# Patient Record
Sex: Female | Born: 1945 | Race: White | Hispanic: No | Smoking: Former smoker
Health system: Southern US, Community
[De-identification: ages and names within clinical notes are randomized; demographics above are authoritative.]

## PROBLEM LIST (undated history)

## (undated) DIAGNOSIS — I10 Essential (primary) hypertension: Secondary | ICD-10-CM

## (undated) DIAGNOSIS — I509 Heart failure, unspecified: Secondary | ICD-10-CM

## (undated) DIAGNOSIS — I214 Non-ST elevation (NSTEMI) myocardial infarction: Secondary | ICD-10-CM

## (undated) DIAGNOSIS — J310 Chronic rhinitis: Secondary | ICD-10-CM

## (undated) DIAGNOSIS — R0602 Shortness of breath: Secondary | ICD-10-CM

## (undated) DIAGNOSIS — E039 Hypothyroidism, unspecified: Secondary | ICD-10-CM

## (undated) DIAGNOSIS — Z8719 Personal history of other diseases of the digestive system: Secondary | ICD-10-CM

## (undated) DIAGNOSIS — F419 Anxiety disorder, unspecified: Secondary | ICD-10-CM

## (undated) DIAGNOSIS — I4892 Unspecified atrial flutter: Secondary | ICD-10-CM

## (undated) DIAGNOSIS — E079 Disorder of thyroid, unspecified: Secondary | ICD-10-CM

## (undated) DIAGNOSIS — K1120 Sialoadenitis, unspecified: Secondary | ICD-10-CM

## (undated) DIAGNOSIS — K219 Gastro-esophageal reflux disease without esophagitis: Secondary | ICD-10-CM

## (undated) DIAGNOSIS — Z9289 Personal history of other medical treatment: Secondary | ICD-10-CM

## (undated) DIAGNOSIS — J449 Chronic obstructive pulmonary disease, unspecified: Secondary | ICD-10-CM

## (undated) DIAGNOSIS — J189 Pneumonia, unspecified organism: Secondary | ICD-10-CM

## (undated) HISTORY — PX: BREAST SURGERY: SHX581

## (undated) HISTORY — PX: TONSILLECTOMY: SUR1361

## (undated) HISTORY — DX: Heart failure, unspecified: I50.9

## (undated) HISTORY — DX: Gastro-esophageal reflux disease without esophagitis: K21.9

## (undated) HISTORY — DX: Sialoadenitis, unspecified: K11.20

## (undated) HISTORY — DX: Chronic rhinitis: J31.0

## (undated) HISTORY — DX: Personal history of other medical treatment: Z92.89

---

## 2000-01-21 ENCOUNTER — Encounter: Payer: Self-pay | Admitting: *Deleted

## 2000-01-21 ENCOUNTER — Encounter: Admission: RE | Admit: 2000-01-21 | Discharge: 2000-01-21 | Payer: Self-pay | Admitting: *Deleted

## 2001-02-16 ENCOUNTER — Encounter: Admission: RE | Admit: 2001-02-16 | Discharge: 2001-02-16 | Payer: Self-pay | Admitting: Internal Medicine

## 2001-02-16 ENCOUNTER — Encounter: Payer: Self-pay | Admitting: Internal Medicine

## 2001-05-11 ENCOUNTER — Encounter: Payer: Self-pay | Admitting: Internal Medicine

## 2001-05-11 ENCOUNTER — Encounter: Admission: RE | Admit: 2001-05-11 | Discharge: 2001-05-11 | Payer: Self-pay | Admitting: Internal Medicine

## 2005-11-02 ENCOUNTER — Emergency Department (HOSPITAL_COMMUNITY): Admission: EM | Admit: 2005-11-02 | Discharge: 2005-11-02 | Payer: Self-pay | Admitting: Family Medicine

## 2005-11-21 ENCOUNTER — Encounter: Admission: RE | Admit: 2005-11-21 | Discharge: 2005-11-21 | Payer: Self-pay | Admitting: Internal Medicine

## 2010-10-14 ENCOUNTER — Encounter: Payer: Self-pay | Admitting: Internal Medicine

## 2012-03-10 ENCOUNTER — Other Ambulatory Visit: Payer: Self-pay | Admitting: Internal Medicine

## 2012-03-10 DIAGNOSIS — Z1231 Encounter for screening mammogram for malignant neoplasm of breast: Secondary | ICD-10-CM

## 2012-04-06 ENCOUNTER — Encounter (HOSPITAL_COMMUNITY): Payer: Self-pay

## 2012-04-20 ENCOUNTER — Ambulatory Visit: Payer: Self-pay

## 2012-04-27 ENCOUNTER — Encounter (HOSPITAL_COMMUNITY): Payer: Self-pay

## 2012-05-04 ENCOUNTER — Encounter (HOSPITAL_COMMUNITY): Payer: Self-pay

## 2012-05-11 ENCOUNTER — Ambulatory Visit: Payer: Self-pay

## 2012-05-18 ENCOUNTER — Encounter (HOSPITAL_COMMUNITY): Payer: Self-pay

## 2012-06-01 ENCOUNTER — Ambulatory Visit: Payer: Self-pay

## 2012-06-08 ENCOUNTER — Encounter (HOSPITAL_COMMUNITY): Payer: Self-pay

## 2012-06-15 ENCOUNTER — Ambulatory Visit: Payer: Self-pay

## 2012-06-22 ENCOUNTER — Encounter (HOSPITAL_COMMUNITY): Payer: Self-pay

## 2012-06-29 ENCOUNTER — Encounter (HOSPITAL_COMMUNITY): Payer: Self-pay

## 2012-07-06 ENCOUNTER — Ambulatory Visit: Payer: Self-pay

## 2012-07-13 ENCOUNTER — Encounter (HOSPITAL_COMMUNITY): Payer: Self-pay

## 2012-07-20 ENCOUNTER — Inpatient Hospital Stay (HOSPITAL_COMMUNITY): Admission: RE | Admit: 2012-07-20 | Payer: Self-pay | Source: Ambulatory Visit

## 2012-07-27 ENCOUNTER — Ambulatory Visit: Payer: Self-pay

## 2012-08-31 ENCOUNTER — Ambulatory Visit: Payer: Self-pay

## 2012-09-06 ENCOUNTER — Encounter (HOSPITAL_COMMUNITY): Payer: Self-pay | Admitting: *Deleted

## 2012-09-06 ENCOUNTER — Inpatient Hospital Stay (HOSPITAL_COMMUNITY)
Admission: EM | Admit: 2012-09-06 | Discharge: 2012-09-12 | DRG: 154 | Disposition: A | Discharge status: Home / Self Care | Payer: Medicare Other | Attending: Internal Medicine | Admitting: Internal Medicine

## 2012-09-06 ENCOUNTER — Other Ambulatory Visit: Payer: Self-pay

## 2012-09-06 ENCOUNTER — Emergency Department (HOSPITAL_COMMUNITY): Payer: Medicare Other

## 2012-09-06 DIAGNOSIS — J441 Chronic obstructive pulmonary disease with (acute) exacerbation: Secondary | ICD-10-CM | POA: Diagnosis present

## 2012-09-06 DIAGNOSIS — F411 Generalized anxiety disorder: Secondary | ICD-10-CM | POA: Diagnosis present

## 2012-09-06 DIAGNOSIS — Z720 Tobacco use: Secondary | ICD-10-CM | POA: Diagnosis present

## 2012-09-06 DIAGNOSIS — J9600 Acute respiratory failure, unspecified whether with hypoxia or hypercapnia: Secondary | ICD-10-CM | POA: Diagnosis present

## 2012-09-06 DIAGNOSIS — I4892 Unspecified atrial flutter: Secondary | ICD-10-CM | POA: Diagnosis present

## 2012-09-06 DIAGNOSIS — E890 Postprocedural hypothyroidism: Secondary | ICD-10-CM | POA: Diagnosis present

## 2012-09-06 DIAGNOSIS — E669 Obesity, unspecified: Secondary | ICD-10-CM | POA: Diagnosis present

## 2012-09-06 DIAGNOSIS — F17200 Nicotine dependence, unspecified, uncomplicated: Secondary | ICD-10-CM | POA: Diagnosis present

## 2012-09-06 DIAGNOSIS — I5031 Acute diastolic (congestive) heart failure: Secondary | ICD-10-CM | POA: Diagnosis present

## 2012-09-06 DIAGNOSIS — J9690 Respiratory failure, unspecified, unspecified whether with hypoxia or hypercapnia: Secondary | ICD-10-CM | POA: Diagnosis present

## 2012-09-06 DIAGNOSIS — R221 Localized swelling, mass and lump, neck: Secondary | ICD-10-CM

## 2012-09-06 DIAGNOSIS — Z6836 Body mass index (BMI) 36.0-36.9, adult: Secondary | ICD-10-CM

## 2012-09-06 DIAGNOSIS — I10 Essential (primary) hypertension: Secondary | ICD-10-CM | POA: Diagnosis present

## 2012-09-06 DIAGNOSIS — K1120 Sialoadenitis, unspecified: Secondary | ICD-10-CM | POA: Diagnosis present

## 2012-09-06 HISTORY — DX: Hypothyroidism, unspecified: E03.9

## 2012-09-06 HISTORY — DX: Disorder of thyroid, unspecified: E07.9

## 2012-09-06 HISTORY — DX: Shortness of breath: R06.02

## 2012-09-06 HISTORY — DX: Personal history of other diseases of the digestive system: Z87.19

## 2012-09-06 HISTORY — DX: Chronic obstructive pulmonary disease, unspecified: J44.9

## 2012-09-06 HISTORY — DX: Unspecified atrial flutter: I48.92

## 2012-09-06 HISTORY — DX: Essential (primary) hypertension: I10

## 2012-09-06 HISTORY — DX: Anxiety disorder, unspecified: F41.9

## 2012-09-06 LAB — CBC WITH DIFFERENTIAL/PLATELET
Basophils Absolute: 0 10*3/uL (ref 0.0–0.1)
Basophils Relative: 0 % (ref 0–1)
Eosinophils Absolute: 0.1 10*3/uL (ref 0.0–0.7)
Eosinophils Relative: 1 % (ref 0–5)
HCT: 45.7 % (ref 36.0–46.0)
Lymphocytes Relative: 14 % (ref 12–46)
MCH: 33.3 pg (ref 26.0–34.0)
MCHC: 33.5 g/dL (ref 30.0–36.0)
MCV: 99.3 fL (ref 78.0–100.0)
Monocytes Absolute: 1.2 10*3/uL — ABNORMAL HIGH (ref 0.1–1.0)
Platelets: 236 10*3/uL (ref 150–400)
RDW: 14.2 % (ref 11.5–15.5)

## 2012-09-06 LAB — COMPREHENSIVE METABOLIC PANEL
AST: 17 U/L (ref 0–37)
CO2: 26 mEq/L (ref 19–32)
Calcium: 9.5 mg/dL (ref 8.4–10.5)
Creatinine, Ser: 0.66 mg/dL (ref 0.50–1.10)
GFR calc non Af Amer: >90 mL/min (ref >90)
Total Protein: 7.1 g/dL (ref 6.0–8.3)

## 2012-09-06 LAB — POCT I-STAT TROPONIN I

## 2012-09-06 MED ORDER — DILTIAZEM HCL 100 MG IV SOLR
5.0000 mg/h | Freq: Once | INTRAVENOUS | Status: AC
  Administered 2012-09-06: 5 mg/h via INTRAVENOUS
  Filled 2012-09-06: qty 100

## 2012-09-06 MED ORDER — METOPROLOL TARTRATE 1 MG/ML IV SOLN
5.0000 mg | Freq: Once | INTRAVENOUS | Status: AC
  Administered 2012-09-06: 5 mg via INTRAVENOUS
  Filled 2012-09-06: qty 5

## 2012-09-06 MED ORDER — DILTIAZEM HCL 25 MG/5ML IV SOLN
20.0000 mg | Freq: Once | INTRAVENOUS | Status: AC
  Administered 2012-09-06: 20 mg via INTRAVENOUS

## 2012-09-06 NOTE — ED Notes (Signed)
Admitting here to see, pt in CT.

## 2012-09-06 NOTE — ED Notes (Signed)
Pt alert, NAD, calm, interactive, skin W&D, resps e/u, speaking in clear complete sentences, to CT, IV site reinforced.

## 2012-09-06 NOTE — ED Notes (Signed)
Pt in c/o swelling to left side of face since this afternoon, pt states this happened to her approx 10 years ago and was told it had to do with her glands in her mouth, airway intact, no distress noted.

## 2012-09-06 NOTE — ED Notes (Signed)
Pt denies CP at this time. Pt appears anxious at this time. Comfort measures attempted. Pt placed on bedpan.

## 2012-09-06 NOTE — ED Provider Notes (Signed)
History     CSN: [id number]  Arrival date & time 09/06/12  2138   First MD Initiated Contact with Patient 09/06/12 2206      Chief Complaint  Patient presents with  . Facial Swelling    (Consider location/radiation/quality/duration/timing/severity/associated sxs/prior treatment) The history is provided by the patient.   patient here with left-sided neck swelling x1 day. History of similar symptoms 10 years ago diagnosed with possible salivary gland obstruction. Denies any chest pain chest pressure. No dyspnea. No palpitations. States she does feel jittery. While patient was in triage she did have an EKG that showed atrial flutter and she was sent back to her room. She denies any prior history of atrial flutter. Does have a history of hypothyroidism and denies any medication changes  Past Medical History  Diagnosis Date  . Hypertension   . Thyroid disease     History reviewed. No pertinent past surgical history.  History reviewed. No pertinent family history.  History  Substance Use Topics  . Smoking status: Not on file  . Smokeless tobacco: Not on file  . Alcohol Use:     OB History    Grav Para Term Preterm Abortions TAB SAB Ect Mult Living                  Review of Systems  All other systems reviewed and are negative.    Allergies  Review of patient's allergies indicates no known allergies.  Home Medications  No current outpatient prescriptions on file.  BP 171/116  Pulse 153  Temp 97.8 F (36.6 C) (Oral)  Resp 28  SpO2 98%  Physical Exam  Nursing note and vitals reviewed. Constitutional: She is oriented to person, place, and time. She appears well-developed and well-nourished.  Non-toxic appearance. No distress.  HENT:  Head: Normocephalic and atraumatic.  Eyes: Conjunctivae normal, EOM and lids are normal. Pupils are equal, round, and reactive to light.  Neck: Normal range of motion. Neck supple. No tracheal deviation present. No mass present.       Left neck submandibular swelling with some erythema. No stridor noted. No crepitus appreciated.  Cardiovascular: Normal heart sounds.  An irregularly irregular rhythm present. Tachycardia present.  Exam reveals no gallop.   No murmur heard. Pulmonary/Chest: Effort normal and breath sounds normal. No stridor. No respiratory distress. She has no decreased breath sounds. She has no wheezes. She has no rhonchi. She has no rales.  Abdominal: Soft. Normal appearance and bowel sounds are normal. She exhibits no distension. There is no tenderness. There is no rebound and no CVA tenderness.  Musculoskeletal: Normal range of motion. She exhibits no edema and no tenderness.  Neurological: She is alert and oriented to person, place, and time. She has normal strength. No cranial nerve deficit or sensory deficit. GCS eye subscore is 4. GCS verbal subscore is 5. GCS motor subscore is 6.  Skin: Skin is warm and dry. No abrasion and no rash noted.  Psychiatric: She has a normal mood and affect. Her speech is normal and behavior is normal.    ED Course  Procedures (including critical care time)   Labs Reviewed  TSH  T4  CBC WITH DIFFERENTIAL  COMPREHENSIVE METABOLIC PANEL   No results found.   No diagnosis found.    MDM   Date: 09/06/2012  Rate: 152  Rhythm: atrial flutter  QRS Axis: normal  Intervals: normal  ST/T Wave abnormalities: nonspecific ST changes  Conduction Disutrbances:none  Narrative Interpretation:   Old  EKG Reviewed: none available  11:42 PM Patient given Cardizem 20 mg IV push and placed on a Cardizem drip at 10 mg per hour. Her heart rate remained elevated she was given a second bolus of Cardizem 20 mg IV push and her heart rate still remained elevated at what appears to be atrial flutter with 2:1 block. Spoke with cardiology on call, recommended that the patient received Lopressor 5 mg IV push and this was ordered. CT of her neck is pending at this time. I spoke with the  triad hospitalist on call and they will admit the patient  CRITICAL CARE Performed by: Toy Baker   Total critical care time: 75  Critical care time was exclusive of separately billable procedures and treating other patients.  Critical care was necessary to treat or prevent imminent or life-threatening deterioration.  Critical care was time spent personally by me on the following activities: development of treatment plan with patient and/or surrogate as well as nursing, discussions with consultants, evaluation of patient's response to treatment, examination of patient, obtaining history from patient or surrogate, ordering and performing treatments and interventions, ordering and review of laboratory studies, ordering and review of radiographic studies, pulse oximetry and re-evaluation of patient's condition.         Toy Baker, MD 09/06/12 [phone]

## 2012-09-06 NOTE — ED Notes (Signed)
When questioned about heart rate, pt states she has been feeling weak over last day, states she has been complaint with her thyroid medication. Pt denies chest pain at this time, states she feels weak. Pt respirations even and nonlabored, skin warm and dry. EKG being obtained.

## 2012-09-07 ENCOUNTER — Encounter (HOSPITAL_COMMUNITY): Payer: Self-pay

## 2012-09-07 DIAGNOSIS — I10 Essential (primary) hypertension: Secondary | ICD-10-CM

## 2012-09-07 DIAGNOSIS — I4892 Unspecified atrial flutter: Secondary | ICD-10-CM | POA: Insufficient documentation

## 2012-09-07 DIAGNOSIS — E890 Postprocedural hypothyroidism: Secondary | ICD-10-CM

## 2012-09-07 DIAGNOSIS — R220 Localized swelling, mass and lump, head: Secondary | ICD-10-CM

## 2012-09-07 DIAGNOSIS — R221 Localized swelling, mass and lump, neck: Secondary | ICD-10-CM | POA: Insufficient documentation

## 2012-09-07 DIAGNOSIS — K1120 Sialoadenitis, unspecified: Secondary | ICD-10-CM | POA: Diagnosis present

## 2012-09-07 DIAGNOSIS — Z720 Tobacco use: Secondary | ICD-10-CM | POA: Diagnosis present

## 2012-09-07 HISTORY — DX: Unspecified atrial flutter: I48.92

## 2012-09-07 LAB — BASIC METABOLIC PANEL
BUN: 10 mg/dL (ref 6–23)
Chloride: 105 mEq/L (ref 96–112)
Creatinine, Ser: 0.56 mg/dL (ref 0.50–1.10)
GFR calc Af Amer: >90 mL/min (ref >90)

## 2012-09-07 LAB — T3, FREE: T3, Free: 2.4 pg/mL (ref 2.3–4.2)

## 2012-09-07 LAB — CBC
HCT: 44.3 % (ref 36.0–46.0)
MCHC: 32.7 g/dL (ref 30.0–36.0)
MCV: 100 fL (ref 78.0–100.0)
RDW: 14.4 % (ref 11.5–15.5)

## 2012-09-07 LAB — TROPONIN I: Troponin I: <0.3 ng/mL (ref <0.30)

## 2012-09-07 MED ORDER — IOHEXOL 300 MG/ML  SOLN
75.0000 mL | Freq: Once | INTRAMUSCULAR | Status: AC | PRN
  Administered 2012-09-07: 75 mL via INTRAVENOUS

## 2012-09-07 MED ORDER — SODIUM CHLORIDE 0.9 % IV SOLN
INTRAVENOUS | Status: DC
  Administered 2012-09-07: 16:00:00 via INTRAVENOUS

## 2012-09-07 MED ORDER — BIOTENE DRY MOUTH MT LIQD
15.0000 mL | Freq: Two times a day (BID) | OROMUCOSAL | Status: DC
  Administered 2012-09-07 – 2012-09-09 (×5): 15 mL via OROMUCOSAL

## 2012-09-07 MED ORDER — ONDANSETRON HCL 4 MG/2ML IJ SOLN: 4.0000 mg | Freq: Four times a day (QID) | INTRAMUSCULAR | Status: DC | PRN

## 2012-09-07 MED ORDER — ASPIRIN EC 325 MG PO TBEC
325.0000 mg | DELAYED_RELEASE_TABLET | Freq: Every day | ORAL | Status: DC
  Administered 2012-09-07 – 2012-09-08 (×2): 325 mg via ORAL
  Filled 2012-09-07 (×3): qty 1

## 2012-09-07 MED ORDER — METOPROLOL TARTRATE 25 MG PO TABS
25.0000 mg | ORAL_TABLET | Freq: Two times a day (BID) | ORAL | Status: DC
  Administered 2012-09-07 – 2012-09-10 (×7): 25 mg via ORAL
  Filled 2012-09-07 (×8): qty 1

## 2012-09-07 MED ORDER — ENOXAPARIN SODIUM 100 MG/ML ~~LOC~~ SOLN
100.0000 mg | Freq: Two times a day (BID) | SUBCUTANEOUS | Status: DC
  Administered 2012-09-07: 100 mg via SUBCUTANEOUS
  Filled 2012-09-07 (×3): qty 1

## 2012-09-07 MED ORDER — SODIUM CHLORIDE 0.9 % IJ SOLN
3.0000 mL | Freq: Two times a day (BID) | INTRAMUSCULAR | Status: DC
  Administered 2012-09-07 – 2012-09-12 (×10): 3 mL via INTRAVENOUS

## 2012-09-07 MED ORDER — LISINOPRIL 40 MG PO TABS
40.0000 mg | ORAL_TABLET | Freq: Every day | ORAL | Status: DC
  Administered 2012-09-07 – 2012-09-12 (×6): 40 mg via ORAL
  Filled 2012-09-07 (×6): qty 1

## 2012-09-07 MED ORDER — DILTIAZEM HCL 100 MG IV SOLR
5.0000 mg/h | INTRAVENOUS | Status: DC
  Administered 2012-09-07: 10 mg/h via INTRAVENOUS
  Administered 2012-09-07: 12 mg/h via INTRAVENOUS
  Administered 2012-09-08: 10 mg/h via INTRAVENOUS
  Filled 2012-09-07: qty 100

## 2012-09-07 MED ORDER — SODIUM CHLORIDE 0.9 % IJ SOLN
3.0000 mL | Freq: Two times a day (BID) | INTRAMUSCULAR | Status: DC
  Administered 2012-09-08 – 2012-09-12 (×5): 3 mL via INTRAVENOUS

## 2012-09-07 MED ORDER — METOPROLOL TARTRATE 1 MG/ML IV SOLN
5.0000 mg | Freq: Once | INTRAVENOUS | Status: AC
  Administered 2012-09-07: 5 mg via INTRAVENOUS
  Filled 2012-09-07: qty 5

## 2012-09-07 MED ORDER — DILTIAZEM HCL ER COATED BEADS 240 MG PO CP24
240.0000 mg | ORAL_CAPSULE | Freq: Every day | ORAL | Status: DC
  Administered 2012-09-07: 240 mg via ORAL
  Filled 2012-09-07: qty 1

## 2012-09-07 MED ORDER — ENOXAPARIN SODIUM 100 MG/ML ~~LOC~~ SOLN
100.0000 mg | Freq: Once | SUBCUTANEOUS | Status: AC
  Administered 2012-09-07: 100 mg via SUBCUTANEOUS
  Filled 2012-09-07: qty 1

## 2012-09-07 MED ORDER — ONDANSETRON HCL 4 MG PO TABS: 4.0000 mg | ORAL_TABLET | Freq: Four times a day (QID) | ORAL | Status: DC | PRN

## 2012-09-07 MED ORDER — CLINDAMYCIN PHOSPHATE 600 MG/50ML IV SOLN
600.0000 mg | Freq: Three times a day (TID) | INTRAVENOUS | Status: DC
  Administered 2012-09-07 – 2012-09-08 (×5): 600 mg via INTRAVENOUS
  Filled 2012-09-07 (×6): qty 50

## 2012-09-07 MED ORDER — ACETAMINOPHEN 325 MG PO TABS
650.0000 mg | ORAL_TABLET | Freq: Four times a day (QID) | ORAL | Status: DC | PRN
  Administered 2012-09-10: 650 mg via ORAL

## 2012-09-07 MED ORDER — ACETAMINOPHEN 650 MG RE SUPP: 650.0000 mg | Freq: Four times a day (QID) | RECTAL | Status: DC | PRN

## 2012-09-07 MED ORDER — LEVOTHYROXINE SODIUM 100 MCG PO TABS
100.0000 ug | ORAL_TABLET | Freq: Every day | ORAL | Status: DC
  Administered 2012-09-07 – 2012-09-12 (×6): 100 ug via ORAL
  Filled 2012-09-07 (×8): qty 1

## 2012-09-07 MED ORDER — SODIUM CHLORIDE 0.9 % IJ SOLN: 3.0000 mL | INTRAMUSCULAR | Status: DC | PRN

## 2012-09-07 NOTE — H&P (Signed)
Caitlin Hampton is an 66 y.o. female.   Patient was seen and examined on September 07, 2012. PCP - Dr. Tyson Dense. Chief Complaint: Left neck mass. 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 Patient's swelling started last afternoon and it was painless and denies any fever chills. Denies any trauma. In the ER patient was found to be in atrial flutter with RVR and was started on Cardizem infusion after bolus. Since her heart rate was still persistently high cardiologist on call Dr. Loney Hering of [facility] heart and vascular was consulted by ER physician Dr. Hessie Diener and was advised to give metoprolol IV 5 mg is still not controlled then to start amiodarone. Patient at this time will be admitted for further management. Patient does not complain of any palpitations or chest pain denies any shortness of breath. Patient states she never had atrial flutter before. Patient states she did have swelling and swelling on the right side in 2007.  Past Medical History  Diagnosis Date  . Hypertension   . Thyroid disease   . COPD (chronic obstructive pulmonary disease)   . Anxiety   . H/O hiatal hernia   . Hypothyroidism   . Shortness of breath   . Atrial flutter 09/07/2012    Past Surgical History  Procedure Date  . Breast surgery     benign R brest CA    Family History  Problem Relation Age of Onset  . CAD Mother   . Stroke Mother   . Prostate cancer Father    Social History:  reports that she has been smoking Cigarettes.  She has been smoking about 1.5 packs per day. She does not have any smokeless tobacco history on file. She reports that she does not drink alcohol or use illicit drugs.  Allergies: No Known Allergies  No prescriptions prior to admission    Results for orders placed during the hospital encounter of 09/06/12 (from the past 48 hour(s))  CBC WITH  DIFFERENTIAL     Status: Abnormal   Collection Time   09/06/12 10:15 PM      Component Value Range Comment   WBC 12.5 (*) 4.0 - 10.5 K/uL    RBC 4.60  3.87 - 5.11 MIL/uL    Hemoglobin 15.3 (*) 12.0 - 15.0 g/dL    HCT 96.0  45.4 - 09.8 %    MCV 99.3  78.0 - 100.0 fL    MCH 33.3  26.0 - 34.0 pg    MCHC 33.5  30.0 - 36.0 g/dL    RDW 11.9  14.7 - 82.9 %    Platelets 236  150 - 400 K/uL    Neutrophils Relative 76  43 - 77 %    Neutro Abs 9.5 (*) 1.7 - 7.7 K/uL    Lymphocytes Relative 14  12 - 46 %    Lymphs Abs 1.7  0.7 - 4.0 K/uL    Monocytes Relative 9  3 - 12 %    Monocytes Absolute 1.2 (*) 0.1 - 1.0 K/uL    Eosinophils Relative 1  0 - 5 %    Eosinophils Absolute 0.1  0.0 - 0.7 K/uL    Basophils Relative 0  0 - 1 %    Basophils Absolute 0.0  0.0 - 0.1 K/uL   COMPREHENSIVE METABOLIC PANEL     Status: Abnormal   Collection Time   09/06/12 10:15 PM  Component Value Range Comment   Sodium 138  135 - 145 mEq/L    Potassium 3.6  3.5 - 5.1 mEq/L    Chloride 100  96 - 112 mEq/L    CO2 26  19 - 32 mEq/L    Glucose, Bld 133 (*) 70 - 99 mg/dL    BUN 12  6 - 23 mg/dL    Creatinine, Ser 1.61  0.50 - 1.10 mg/dL    Calcium 9.5  8.4 - 09.6 mg/dL    Total Protein 7.1  6.0 - 8.3 g/dL    Albumin 4.0  3.5 - 5.2 g/dL    AST 17  0 - 37 U/L    ALT 14  0 - 35 U/L    Alkaline Phosphatase 70  39 - 117 U/L    Total Bilirubin 0.5  0.3 - 1.2 mg/dL    GFR calc non Af Amer >90  >90 mL/min    GFR calc Af Amer >90  >90 mL/min   POCT I-STAT TROPONIN I     Status: Normal   Collection Time   09/06/12 10:39 PM      Component Value Range Comment   Troponin i, poc 0.01  0.00 - 0.08 ng/mL    Comment 3             Ct Soft Tissue Neck W Contrast  09/07/2012  *RADIOLOGY REPORT*  Clinical Data: Left facial swelling  CT NECK WITH CONTRAST  Technique:  Multidetector CT imaging of the neck was performed with intravenous contrast.  Contrast: 75mL OMNIPAQUE IOHEXOL 300 MG/ML  SOLN  Comparison: 11/21/2005   Findings: Inflammatory changes with stranding/fluid surrounding the left parotid gland (series 3/image 51), which is enlarged relative to the right.  No underlying mass is evident by CT.  No drainable fluid collection/abscess.  No associated salivary gland duct calculus.  The pharyngeal soft tissues are unremarkable.  The airway remains patent.  No suspicious cervical lymphadenopathy.  Mild mucosal thickening in the left maxillary sinus.  Mastoid air cells are clear.  The bilateral orbits, including the retroconal soft tissues, are within normal limits.  The visualized brain parenchyma is unremarkable.  Moderate to severe emphysematous changes in the lung apices.  IMPRESSION: Inflammatory changes involving the left parotid gland, suggesting parotiditis.  No underlying mass is evident by CT.  No drainable fluid collection/abscess.  No associated salivary gland duct calculus.  On the prior 2007 CT, similar findings (although less severe) were present on the right.   Original Report Authenticated By: Charline Bills, M.D.    Dg Chest Port 1 View  09/06/2012  *RADIOLOGY REPORT*  Clinical Data: Chest pain  PORTABLE CHEST - 1 VIEW  Comparison: 11/19/2010  Findings: Cardiomegaly with pulmonary vascular congestion and suspected mild interstitial edema.  Patchy bilateral lower lobe opacities, possibly atelectasis, less likely pneumonia.  No pleural effusion or pneumothorax.  IMPRESSION: Cardiomegaly with suspected mild interstitial edema.  Patchy bilateral lower lobe opacities, possibly atelectasis, less likely pneumonia.   Original Report Authenticated By: Charline Bills, M.D.     Review of Systems  Constitutional: Negative.   HENT:       Left facial and neck swelling.  Eyes: Negative.   Respiratory: Negative.   Cardiovascular: Negative.   Gastrointestinal: Negative.   Genitourinary: Negative.   Musculoskeletal: Negative.   Skin: Negative.   Neurological: Negative.   Endo/Heme/Allergies: Negative.    Psychiatric/Behavioral: Negative.     Blood pressure 139/84, pulse 151, temperature 98.6 F (37 C), temperature  source Oral, resp. rate 22, SpO2 94.00%. Physical Exam  Constitutional: She is oriented to person, place, and time. She appears well-developed and well-nourished. No distress.  HENT:  Head: Normocephalic and atraumatic.  Right Ear: External ear normal.  Left Ear: External ear normal.  Nose: Nose normal.  Mouth/Throat: Oropharynx is clear and moist. No oropharyngeal exudate.  Eyes: Conjunctivae normal are normal. Pupils are equal, round, and reactive to light. Right eye exhibits no discharge. Left eye exhibits no discharge. No scleral icterus.  Neck: Normal range of motion. Neck supple.       Left jugulo digastric area is swollen but not tender.  Cardiovascular:       Tachycardia with irregular rhythm.  Respiratory: Effort normal and breath sounds normal. No respiratory distress. She has no wheezes. She has no rales.  GI: Soft. Bowel sounds are normal. She exhibits no distension. There is no tenderness. There is no rebound.  Musculoskeletal: She exhibits no edema and no tenderness.  Neurological: She is alert and oriented to person, place, and time.  Skin: Skin is warm and dry. She is not diaphoretic.     Assessment/Plan #1. Atrial flutter with RVR - continue with Cardizem infusion. Patient also states Cardizem CD every day which will be continued and once patient takes oral dose we will try to taper off the infusion. Check thyroid function tests which has been already ordered. Check 2-D echo and cardiac enzymes. Patient's CHADS2 score is one and I have placed patient on aspirin for now. #2. Left-sided parotitis - CAT scan shows parotitis. At this time I have placed patient empirically on clindamycin. There is no obstructing stone or abscess as per the CAT scan. Patient is afebrile at this time and does not have any pain. Closely observe clinically. #3. Hypertension - continue  home medications. #4. 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 #5. Tobacco abuse - advised to quit smoking.  CODE STATUS - full code.  Aiden Rao N. 09/07/2012, 1:28 AM

## 2012-09-07 NOTE — ED Notes (Signed)
Pt back from CT, alert, NAD, calm, interactive, denies pain or nausea, "a little sob b/c I got nervous", laughing with friend at BS, HR 93-110.

## 2012-09-07 NOTE — Progress Notes (Signed)
ANTICOAGULATION CONSULT NOTE - Initial Consult  Pharmacy Consult for Lovenox Indication: atrial fibrillation  No Known Allergies  Patient Measurements: Height: 5\' 4"  (162.6 cm) Weight: 212 lb 15.4 oz (96.6 kg) IBW/kg (Calculated) : 54.7  Heparin Dosing Weight: n/a  Vital Signs: Temp: 98.4 F (36.9 C) (12/16 0800) Temp src: Oral (12/16 0800) BP: 129/79 mmHg (12/16 0952) Pulse Rate: 49  (12/16 0800)  Labs:  Basename 09/07/12 0926 09/07/12 0540 09/07/12 0216 09/06/12 2215  HGB -- 14.5 -- 15.3*  HCT -- 44.3 -- 45.7  PLT -- 219 -- 236  APTT -- -- -- --  LABPROT -- -- -- --  INR -- -- -- --  HEPARINUNFRC -- -- -- --  CREATININE -- 0.56 -- 0.66  CKTOTAL -- -- -- --  CKMB -- -- -- --  TROPONINI <0.30 -- <0.30 --    Estimated Creatinine Clearance: 78.1 ml/min (by C-G formula based on Cr of 0.56).   Medical History: Past Medical History  Diagnosis Date  . Hypertension   . Thyroid disease   . COPD (chronic obstructive pulmonary disease)   . Anxiety   . H/O hiatal hernia   . Hypothyroidism   . Shortness of breath   . Atrial flutter 09/07/2012    Medications:  Scheduled:    . aspirin EC  325 mg Oral Daily  . clindamycin (CLEOCIN) IV  600 mg Intravenous Q8H  . [COMPLETED] diltiazem (CARDIZEM) infusion  5 mg/hr Intravenous Once  . [COMPLETED] diltiazem  20 mg Intravenous Once  . [COMPLETED] diltiazem  20 mg Intravenous Once  . levothyroxine  100 mcg Oral QAC breakfast  . lisinopril  40 mg Oral Daily  . [COMPLETED] metoprolol  5 mg Intravenous Once  . metoprolol  5 mg Intravenous Once  . metoprolol tartrate  25 mg Oral BID  . sodium chloride  3 mL Intravenous Q12H  . sodium chloride  3 mL Intravenous Q12H  . [DISCONTINUED] diltiazem  240 mg Oral Daily    Assessment: 66 yo female admitted with new Afib with RVR.  CBC WNL.  Pharmacy asked to begin anticoagulation with Lovenox.  Per pt no hx bleeding, and no blood thinners PTA.  Goal of Therapy:  Anti-Xa level  0.6-1.2 units/ml 4hrs after LMWH dose given Monitor platelets by anticoagulation protocol: Yes   Plan:  1. Start Lovenox 100 mg q 12 hrs. 2. CBC q 72 hrs while on Lovenox. 3. F/U plans for any oral anticoagulants.  Kinney Sackmann C 09/07/2012,11:54 AM

## 2012-09-07 NOTE — Care Management Note (Addendum)
    Page 1 of 2   09/11/2012     11:48:03 AM   CARE MANAGEMENT NOTE 09/11/2012  Patient:  Caitlin Hampton, Caitlin Hampton   Account Number:  [id number]  Date Initiated:  09/07/2012  Documentation initiated by:  Junius Creamer  Subjective/Objective Assessment:   adm w at fib w rvr     Action/Plan:   lives alone   Anticipated DC Date:  09/12/2012   Anticipated DC Plan:  HOME W HOME HEALTH SERVICES      DC Planning Services  CM consult      Choice offered to / List presented to:        DME agency  [facility]        [facility] agency  [facility]   Status of service:   Medicare Important Message given?   (If response is "NO", the following Medicare IM given date fields will be blank) Date Medicare IM given:   Date Additional Medicare IM given:    Discharge Disposition:    Per UR Regulation:  Reviewed for med. necessity/level of care/duration of stay  If discussed at Long Length of Stay Meetings, dates discussed:    Comments:  09-11-12 11:45am Avie Arenas, RNBSN [phone] Patient also asking for RW on discharge.  Note left in epic for PT and RW prior to dc - discussed agency options with patient.  Would like [facility] - [facility].  09/10/12 JULIE AMERSON,RN,BSN [phone] PHYS THERAPIST RECOMMENDATION IS FOR HH FOLLOW UP.  MD, PLEASE LEAVE ORDER FOR HHPT IF YOU AGREE.  12/17 1430 debbie dowell rn,bsn pt has 30.00 approx copay for xarelto. gave pt xarelto copay assist card.  12/16 11am debbie dowell rn,bsn [phone]

## 2012-09-07 NOTE — Consult Note (Addendum)
Reason for Consult: atrial fib   Referring Physician: Dr. Randie Heinz is an 66 y.o. female.    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 Patient's swelling started the previous afternoon and it was painless and denied any fever chills. Denied any trauma. In the ER patient was found to be in atrial flutter with RVR and was started on Cardizem infusion after bolus. Since her heart rate was still persistently high cardiologist on call Dr. Royann Shivers of [facility] heart and vascular was consulted by ER physician Dr. Freida Busman and was advised to give metoprolol IV 5 mg is still not controlled then to start amiodarone. Patient at this time will be admitted for further management. Patient did not complain of any palpitations or chest pain denied any shortness of breath. Patient stated she never had atrial flutter before. Patient stated she did have swelling and swelling on the right side in 2007.   Currently no chest pain, no SOB.  Though has been SOB with exertion.  Troponin negative X 3, WBC elevated.  EKG on admit a fib with RVR with vent. Rate approx 150, + artifact on EKG also will recheck.    Has been SOB more so on exertion.  On IV cardizem at 12 mg/hr. Heart Rate is 117.  Past Medical History  Diagnosis Date  . Hypertension   . Thyroid disease   . COPD (chronic obstructive pulmonary disease)   . Anxiety   . H/O hiatal hernia   . Hypothyroidism   . Shortness of breath   . Atrial flutter 09/07/2012    Past Surgical History  Procedure Date  . Breast surgery     benign R brest CA    Family History  Problem Relation Age of Onset  . CAD Mother   . Stroke Mother   . Prostate cancer Father    Social History:  reports that she has been smoking  Cigarettes.  She has been smoking about 1.5 packs per day. She does not have any smokeless tobacco history on file. She reports that she does not drink alcohol or use illicit drugs. single, lives alone works as Tree surgeon 5 days a week.   Allergies: No Known Allergies  Medications Prior to Admission  Medication Sig Dispense Refill  . acetaminophen (TYLENOL) 500 MG tablet Take 1,000 mg by mouth every 6 (six) hours as needed. For pain      . diltiazem (DILACOR XR) 240 MG 24 hr capsule Take 240 mg by mouth daily.      Marland Kitchen levothyroxine (SYNTHROID, LEVOTHROID) 100 MCG tablet Take 100 mcg by mouth daily.      Marland Kitchen lisinopril (PRINIVIL,ZESTRIL) 40 MG tablet Take 40 mg by mouth daily.      . ranitidine (ZANTAC) 75 MG tablet Take 75 mg by mouth 2 (two) times daily.        Results for orders placed during the hospital encounter of 09/06/12 (from the past 48 hour(s))  CBC WITH DIFFERENTIAL     Status: Abnormal   Collection Time   09/06/12 10:15 PM      Component Value Range Comment   WBC 12.5 (*) 4.0 - 10.5 K/uL    RBC 4.60  3.87 - 5.11 MIL/uL    Hemoglobin 15.3 (*) 12.0 - 15.0 g/dL  HCT 45.7  36.0 - 46.0 %    MCV 99.3  78.0 - 100.0 fL    MCH 33.3  26.0 - 34.0 pg    MCHC 33.5  30.0 - 36.0 g/dL    RDW 66.4  40.3 - 47.4 %    Platelets 236  150 - 400 K/uL    Neutrophils Relative 76  43 - 77 %    Neutro Abs 9.5 (*) 1.7 - 7.7 K/uL    Lymphocytes Relative 14  12 - 46 %    Lymphs Abs 1.7  0.7 - 4.0 K/uL    Monocytes Relative 9  3 - 12 %    Monocytes Absolute 1.2 (*) 0.1 - 1.0 K/uL    Eosinophils Relative 1  0 - 5 %    Eosinophils Absolute 0.1  0.0 - 0.7 K/uL    Basophils Relative 0  0 - 1 %    Basophils Absolute 0.0  0.0 - 0.1 K/uL   COMPREHENSIVE METABOLIC PANEL     Status: Abnormal   Collection Time   09/06/12 10:15 PM      Component Value Range Comment   Sodium 138  135 - 145 mEq/L    Potassium 3.6  3.5 - 5.1 mEq/L    Chloride 100  96 - 112 mEq/L    CO2 26  19 - 32 mEq/L    Glucose, Bld  133 (*) 70 - 99 mg/dL    BUN 12  6 - 23 mg/dL    Creatinine, Ser 2.59  0.50 - 1.10 mg/dL    Calcium 9.5  8.4 - 56.3 mg/dL    Total Protein 7.1  6.0 - 8.3 g/dL    Albumin 4.0  3.5 - 5.2 g/dL    AST 17  0 - 37 U/L    ALT 14  0 - 35 U/L    Alkaline Phosphatase 70  39 - 117 U/L    Total Bilirubin 0.5  0.3 - 1.2 mg/dL    GFR calc non Af Amer >90  >90 mL/min    GFR calc Af Amer >90  >90 mL/min   POCT I-STAT TROPONIN I     Status: Normal   Collection Time   09/06/12 10:39 PM      Component Value Range Comment   Troponin i, poc 0.01  0.00 - 0.08 ng/mL    Comment 3            MRSA PCR SCREENING     Status: Normal   Collection Time   09/07/12  1:35 AM      Component Value Range Comment   MRSA by PCR NEGATIVE  NEGATIVE   TROPONIN I     Status: Normal   Collection Time   09/07/12  2:16 AM      Component Value Range Comment   Troponin I <0.30  <0.30 ng/mL   BASIC METABOLIC PANEL     Status: Abnormal   Collection Time   09/07/12  5:40 AM      Component Value Range Comment   Sodium 142  135 - 145 mEq/L    Potassium 3.8  3.5 - 5.1 mEq/L    Chloride 105  96 - 112 mEq/L    CO2 28  19 - 32 mEq/L    Glucose, Bld 122 (*) 70 - 99 mg/dL    BUN 10  6 - 23 mg/dL    Creatinine, Ser 8.75  0.50 - 1.10 mg/dL    Calcium 9.3  8.4 - 10.5 mg/dL    GFR calc non Af Amer >90  >90 mL/min    GFR calc Af Amer >90  >90 mL/min   CBC     Status: Abnormal   Collection Time   09/07/12  5:40 AM      Component Value Range Comment   WBC 11.6 (*) 4.0 - 10.5 K/uL    RBC 4.43  3.87 - 5.11 MIL/uL    Hemoglobin 14.5  12.0 - 15.0 g/dL    HCT 16.1  09.6 - 04.5 %    MCV 100.0  78.0 - 100.0 fL    MCH 32.7  26.0 - 34.0 pg    MCHC 32.7  30.0 - 36.0 g/dL    RDW 40.9  81.1 - 91.4 %    Platelets 219  150 - 400 K/uL   T3, FREE     Status: Normal   Collection Time   09/07/12  5:40 AM      Component Value Range Comment   T3, Free 2.4  2.3 - 4.2 pg/mL   TROPONIN I     Status: Normal   Collection Time   09/07/12  9:26  AM      Component Value Range Comment   Troponin I <0.30  <0.30 ng/mL    Ct Soft Tissue Neck W Contrast  09/07/2012  *RADIOLOGY REPORT*  Clinical Data: Left facial swelling  CT NECK WITH CONTRAST  Technique:  Multidetector CT imaging of the neck was performed with intravenous contrast.  Contrast: 75mL OMNIPAQUE IOHEXOL 300 MG/ML  SOLN  Comparison: 11/21/2005  Findings: Inflammatory changes with stranding/fluid surrounding the left parotid gland (series 3/image 51), which is enlarged relative to the right.  No underlying mass is evident by CT.  No drainable fluid collection/abscess.  No associated salivary gland duct calculus.  The pharyngeal soft tissues are unremarkable.  The airway remains patent.  No suspicious cervical lymphadenopathy.  Mild mucosal thickening in the left maxillary sinus.  Mastoid air cells are clear.  The bilateral orbits, including the retroconal soft tissues, are within normal limits.  The visualized brain parenchyma is unremarkable.  Moderate to severe emphysematous changes in the lung apices.  IMPRESSION: Inflammatory changes involving the left parotid gland, suggesting parotiditis.  No underlying mass is evident by CT.  No drainable fluid collection/abscess.  No associated salivary gland duct calculus.  On the prior 2007 CT, similar findings (although less severe) were present on the right.   Original Report Authenticated By: Charline Bills, M.D.    Dg Chest Port 1 View  09/06/2012  *RADIOLOGY REPORT*  Clinical Data: Chest pain  PORTABLE CHEST - 1 VIEW  Comparison: 11/19/2010  Findings: Cardiomegaly with pulmonary vascular congestion and suspected mild interstitial edema.  Patchy bilateral lower lobe opacities, possibly atelectasis, less likely pneumonia.  No pleural effusion or pneumothorax.  IMPRESSION: Cardiomegaly with suspected mild interstitial edema.  Patchy bilateral lower lobe opacities, possibly atelectasis, less likely pneumonia.   Original Report Authenticated By:  Charline Bills, M.D.     ROS: General:no colds or fevers, no weight changes Skin:no rashes or ulcers HEENT:no blurred vision, no congestion CV:see HPI PUL:see HPI GI:no diarrhea constipation or melena, no indigestion GU:no hematuria, no dysuria MS:no joint pain, no claudication Neuro:no syncope, no lightheadedness Endo:no diabetes, + thyroid disease with hx of ablation   Blood pressure 129/79, pulse 49, temperature 98.4 F (36.9 C), temperature source Oral, resp. rate 25, height 5\' 4"  (1.626 m), weight 96.6 kg (212 lb 15.4 oz),  SpO2 95.00%. PE: General:alert and oriented, pleasant affect Skin: warm and dry, brisk capillary refill HEENT:normocephalic, Lt jaw, neck swollen Neck:supple, no JVD, no bruits Heart:S1S2 irreg irreg Lungs:occ mild wheeze Abd:+ BS soft, non tender Ext:no edema Neuro:alert and oriented X 3 MAE, follows commands   ECG: A Flutter 122, variable block. Low Voltage.  Assessment/Plan Principal Problem:  *Atrial flutter with rapid ventricular response Active Problems:  Tobacco abuse  HTN (hypertension)  Hypothyroidism following radioiodine therapy  Parotiditis, with lt facial swelling  PLAN: Pt not aware of irregular HR, but has been DOE with some SOB at rest.  Check lipids.  Plan nuc study in pt vs. outpt., echo has been done but not yet read.  Add ? Xarelto for anticoagulation with plan for DCCV in future?  Was admitted on cardizem 240 in addition to her lisinopril.  ? Add BB carefully due to COPD.  MD to see.    INGOLD,LAURA R 09/07/2012, 10:49 AM  I have seen and evaluated the patient this morning along with Nada Boozer, NP. I agree with her findings, examination as well as impression recommendations.  Admitted for Parotiditis -- that may have triggered Afib/Flutter.  New diagnosis of Afib/Aflutter with no clear onset of arrythmia as she is not aware.  Has had exertional dyspnea for some time.  Troponin neg - r/o MI.  Rate not adequately  controlled on IV Diltiazem, will stop PO diltiazem while on gtt & use BB IV followed by PO with Digoxin as next option;   Agree with need for anticoagulation, but will use Enoxaparin for now until Echo can be read (see below).  Amiodarone may not be great option -- Aflutter can be ablated, existing COPD & not currently anticoagulated with increased CVA risk.  Elevated CBG, smoker, obese (truncal) with HTN = Metabolic Syndrome -- check A1C.   Give multiple RFs for CAD - will need ischemia evaluation, will need to review echo, as localized WMA may help direct Cath vs. Myoview.  Marykay Lex, M.D., M.S. [facility] [address] [place], [place]  [address]  [phone] Pager # [phone] 09/07/2012 11:30 AM

## 2012-09-07 NOTE — Progress Notes (Signed)
*  PRELIMINARY RESULTS* Echocardiogram 2D Echocardiogram has been performed.  Caitlin Hampton 09/07/2012, 10:24 AM 

## 2012-09-08 DIAGNOSIS — E669 Obesity, unspecified: Secondary | ICD-10-CM | POA: Diagnosis present

## 2012-09-08 LAB — LIPID PANEL
HDL: 40 mg/dL (ref >39)
LDL Cholesterol: 85 mg/dL (ref 0–99)
Total CHOL/HDL Ratio: 3.5 RATIO
Triglycerides: 71 mg/dL (ref <150)
VLDL: 14 mg/dL (ref 0–40)

## 2012-09-08 MED ORDER — RIVAROXABAN 20 MG PO TABS
20.0000 mg | ORAL_TABLET | Freq: Every day | ORAL | Status: DC
  Administered 2012-09-08: 20 mg via ORAL
  Filled 2012-09-08: qty 1

## 2012-09-08 MED ORDER — LEVALBUTEROL HCL 0.63 MG/3ML IN NEBU
0.6300 mg | INHALATION_SOLUTION | Freq: Three times a day (TID) | RESPIRATORY_TRACT | Status: DC
  Administered 2012-09-08 – 2012-09-09 (×4): 0.63 mg via RESPIRATORY_TRACT
  Filled 2012-09-08 (×7): qty 3

## 2012-09-08 MED ORDER — RIVAROXABAN 20 MG PO TABS
20.0000 mg | ORAL_TABLET | Freq: Every day | ORAL | Status: DC
  Administered 2012-09-09 – 2012-09-12 (×4): 20 mg via ORAL
  Filled 2012-09-08 (×5): qty 1

## 2012-09-08 MED ORDER — FUROSEMIDE 10 MG/ML IJ SOLN
40.0000 mg | Freq: Once | INTRAMUSCULAR | Status: AC
  Administered 2012-09-08: 40 mg via INTRAVENOUS
  Filled 2012-09-08: qty 4

## 2012-09-08 MED ORDER — PREDNISONE 50 MG PO TABS
60.0000 mg | ORAL_TABLET | Freq: Every day | ORAL | Status: DC
  Administered 2012-09-09: 60 mg via ORAL
  Filled 2012-09-08 (×2): qty 1

## 2012-09-08 MED ORDER — DILTIAZEM HCL ER COATED BEADS 240 MG PO CP24
240.0000 mg | ORAL_CAPSULE | Freq: Two times a day (BID) | ORAL | Status: DC
  Administered 2012-09-08 – 2012-09-12 (×9): 240 mg via ORAL
  Filled 2012-09-08 (×12): qty 1

## 2012-09-08 MED ORDER — NICOTINE 14 MG/24HR TD PT24
14.0000 mg | MEDICATED_PATCH | Freq: Every day | TRANSDERMAL | Status: DC
  Administered 2012-09-08 – 2012-09-12 (×5): 14 mg via TRANSDERMAL
  Filled 2012-09-08 (×5): qty 1

## 2012-09-08 MED ORDER — LORAZEPAM 2 MG/ML IJ SOLN
0.5000 mg | Freq: Once | INTRAMUSCULAR | Status: AC
  Administered 2012-09-08: 0.5 mg via INTRAVENOUS
  Filled 2012-09-08: qty 1

## 2012-09-08 MED ORDER — CLINDAMYCIN HCL 300 MG PO CAPS
300.0000 mg | ORAL_CAPSULE | Freq: Three times a day (TID) | ORAL | Status: DC
  Administered 2012-09-08 – 2012-09-12 (×11): 300 mg via ORAL
  Filled 2012-09-08 (×16): qty 1

## 2012-09-08 NOTE — Evaluation (Signed)
Physical Therapy Evaluation Patient Details Name: Caitlin Hampton MRN: [id number] DOB: November 20, 1945 Today's Date: 09/08/2012 Time: 4098-1191 PT Time Calculation (min): 35 min  PT Assessment / Plan / Recommendation Clinical Impression  Pt admitted with parotitis, COPD, aflutter, SOB. Pt progressing with mobility and able to maintain 94% on 4L with ambulation with HR 91-109 with activity. At rest able to return to 2L O2 with sats 94% as well. Pt is a smoker and educated for benefit of ceasing. Pt will benefit from acute therapy to maximize mobility, gait, transfers and function prior to discharge to return pt to PLOF. Recommend OOB and mobility each day with nursing staff.     PT Assessment  Patient needs continued PT services    Follow Up Recommendations  Home health PT    Does the patient have the potential to tolerate intense rehabilitation      Barriers to Discharge Decreased caregiver support      Equipment Recommendations  Rolling walker with 5" wheels    Recommendations for Other Services     Frequency Min 3X/week    Precautions / Restrictions Precautions Precautions: Fall   Pertinent Vitals/Pain No pain      Mobility  Bed Mobility Bed Mobility: Supine to Sit;Sitting - Scoot to Edge of Bed Supine to Sit: 5: Supervision;HOB elevated (HOB 30 degrees) Sitting - Scoot to Edge of Bed: 6: Modified independent (Device/Increase time) Transfers Transfers: Sit to Stand;Stand to Sit Sit to Stand: 5: Supervision;From bed Stand to Sit: 5: Supervision;To chair/3-in-1 Details for Transfer Assistance: cueing for hand placement for safety Ambulation/Gait Ambulation/Gait Assistance: 5: Supervision Ambulation Distance (Feet): 80 Feet Assistive device: Rolling walker Ambulation/Gait Assistance Details: cueing for posture and position in RW as well as directional cues to room and VC for breathing technique Gait Pattern: Step-through pattern;Decreased stride length Gait velocity:  decreased Stairs: No    Shoulder Instructions     Exercises     PT Diagnosis: Difficulty walking  PT Problem List: Decreased activity tolerance;Decreased mobility;Decreased knowledge of use of DME;Cardiopulmonary status limiting activity PT Treatment Interventions: Gait training;Stair training;Functional mobility training;Therapeutic activities;DME instruction;Patient/family education   PT Goals Acute Rehab PT Goals PT Goal Formulation: With patient/family Time For Goal Achievement: 09/15/12 Potential to Achieve Goals: Good Pt will go Supine/Side to Sit: with modified independence;with HOB 0 degrees PT Goal: Supine/Side to Sit - Progress: Goal set today Pt will go Sit to Supine/Side: with modified independence;with HOB 0 degrees PT Goal: Sit to Supine/Side - Progress: Goal set today Pt will go Sit to Stand: with modified independence PT Goal: Sit to Stand - Progress: Goal set today Pt will go Stand to Sit: with modified independence PT Goal: Stand to Sit - Progress: Goal set today Pt will Ambulate: >150 feet;with modified independence;with least restrictive assistive device PT Goal: Ambulate - Progress: Goal set today Pt will Go Up / Down Stairs: 1-2 stairs;with supervision PT Goal: Up/Down Stairs - Progress: Goal set today  Visit Information  Last PT Received On: 09/08/12 Assistance Needed: +1    Subjective Data  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   Prior Functioning  Home Living Lives With: Alone Available Help at Discharge: Family;Available 24 hours/day Type of Home: House Home Access: Stairs to enter [employer] of Steps: 1, 1 step after ramp from sidewalk Home Layout: One level Bathroom Shower/Tub: Tub/shower unit;Door [employer] Toilet: Standard Home Adaptive Equipment: None Prior Function Level of Independence: Independent Able to Take Stairs?: Yes Driving: Yes  Vocation: Full time  employment Communication Communication: No difficulties    Cognition  Overall Cognitive Status: Appears within functional limits for tasks assessed/performed Arousal/Alertness: Awake/alert Orientation Level: Appears intact for tasks assessed Behavior During Session: [facility] for tasks performed    Extremity/Trunk Assessment Right Upper Extremity Assessment RUE ROM/Strength/Tone: [facility] for tasks assessed Left Upper Extremity Assessment LUE ROM/Strength/Tone: WFL for tasks assessed Right Lower Extremity Assessment RLE ROM/Strength/Tone: [facility] for tasks assessed Left Lower Extremity Assessment LLE ROM/Strength/Tone: WFL for tasks assessed Trunk Assessment Trunk Assessment: Normal   Balance    End of Session PT - End of Session Equipment Utilized During Treatment: Gait belt Activity Tolerance: Patient tolerated treatment well Patient left: in chair;with call bell/phone within reach;with family/visitor present  GP     Toney Sang Beth 09/08/2012, 5:13 PM  Delaney Meigs, PT [phone]

## 2012-09-08 NOTE — Progress Notes (Signed)
Subjective:  No CP or palpitations. Reports some SOB.  Objective:  Vital Signs in the last 24 hours: Temp:  [98 F (36.7 C)-98.3 F (36.8 C)] 98.3 F (36.8 C) (12/17 0400) Pulse Rate:  [45-147] 48  (12/17 0600) Resp:  [17-29] 25  (12/17 0600) BP: (96-152)/(55-106) 118/63 mmHg (12/17 0600) SpO2:  [90 %-96 %] 95 % (12/17 0600)  Intake/Output from previous day:  Intake/Output Summary (Last 24 hours) at 09/08/12 0845 Last data filed at 09/08/12 0618  Gross per 24 hour  Intake   1089 ml  Output    465 ml  Net    624 ml    Physical Exam: General appearance: alert, cooperative and no distress Lungs: wheezes bilaterally Heart: distant heart sounds, irregularly irregular Extremities: No LEE Pulses: 2+ and symmetric Skin: warm and dry Neurologic: Grossly normal   Rate: 92  Rhythm: currently in a flutter  Lab Results:  Basename 09/07/12 0540 09/06/12 2215  WBC 11.6* 12.5*  HGB 14.5 15.3*  PLT 219 236    Basename 09/07/12 0540 09/06/12 2215  NA 142 138  K 3.8 3.6  CL 105 100  CO2 28 26  GLUCOSE 122* 133*  BUN 10 12  CREATININE 0.56 0.66    Basename 09/07/12 1244 09/07/12 0926  TROPONINI <0.30 <0.30   Hepatic Function Panel  Basename 09/06/12 2215  PROT 7.1  ALBUMIN 4.0  AST 17  ALT 14  ALKPHOS 70  BILITOT 0.5  BILIDIR --  IBILI --    Basename 09/08/12 0505  CHOL 139   No results found for this basename: INR in the last 72 hours  Imaging: Imaging results have been reviewed  Cardiac Studies:  Assessment/Plan:   Principal Problem:  *Atrial flutter with rapid ventricular response, converted within 24hrs Active Problems:  HTN (hypertension), LVH with NL LVF  Parotiditis, with lt facial swelling  Tobacco abuse  Hypothyroidism following radioiodine therapy, TSH WNL   Obese   Plan: Currently in flutter on telemetry. Will order an EKG. Pt is actively wheezing. Will cancel nuc study for today and will reschedule for tomorrow. Will d/c IV diltiazem  and will increase PO diltiazem. Will d/c Lovenox and will start on Xarelto.   Corine Shelter PA-C 09/08/2012, 8:45 AM   I have seen and examined the patient along with Corine Shelter PA-C.  I have reviewed the chart, notes and new data.  I agree with PA's note.  Key new complaints: dyspnea and weakness Key examination changes: AFlutter with good rate control at rest (3:1-4:1 block, VR 90s); wheezing Key new findings / data: echo shows normal LVEF, no major valvular abnormalities.  PLAN: A Flutter is of unknown duration, potentially weeks. It is not safe to do a cardioversion without either TEE or 4 weeks preceding anticoagulation. She is actively wheezing and tachypneic - I do not think she is a good candidate for sedation for a TEE or an electrical CV at this time. I suggest managing her arrhythmia with rate control (preferentially using higher dose diltiazem rather than increasing the dose of beta blockers) and oral anticoagulants (will start xarelto instead of enoxaparin). If rate control is adequate, would plan an elective DC CV in 4 weeks. If atrial flutter recurs early after CV, she is best suited for RF ablation rather than chronic antiarrhythmic therapy. For the same reason, I worry about worsening bronchospasm with Lexiscan. efer Myoview until wheezing abates. It can even be done as an outpatient - angina is not present and there is  no sign of acute coronary insufficiency.  Thurmon Fair, MD, [facility] [phone] 09/08/2012, 8:59 AM

## 2012-09-08 NOTE — Progress Notes (Signed)
Subjective: SOB with activity.  Has had cough productive of clear phlegm prior to admission  Objective: Vital signs in last 24 hours: Temp:  [98 F (36.7 C)-98.4 F (36.9 C)] 98.3 F (36.8 C) (12/17 0400) Pulse Rate:  [45-147] 48  (12/17 0600) Resp:  [17-29] 25  (12/17 0600) BP: (96-152)/(55-106) 118/63 mmHg (12/17 0600) SpO2:  [90 %-96 %] 95 % (12/17 0600) Weight change:  Last BM Date: 09/06/12  Intake/Output from previous day: 12/16 0701 - 12/17 0700 In: 1121 [P.O.:260; I.V.:711; IV Piggyback:150] Out: 615 [Urine:615] Intake/Output this shift:    Resp: no wheezes, decreased breath sounds bilaterally,with faint exp wheezes,  O2 sat 87% RA Cardio: irregularly irregular rhythm Extremities: extremities normal, atraumatic, no cyanosis , trace edema  Lab Results:  [facility] 09/07/12 0540 09/06/12 2215  WBC 11.6* 12.5*  HGB 14.5 15.3*  HCT 44.3 45.7  PLT 219 236   BMET  Basename 09/07/12 0540 09/06/12 2215  NA 142 138  K 3.8 3.6  CL 105 100  CO2 28 26  GLUCOSE 122* 133*  BUN 10 12  CREATININE 0.56 0.66  CALCIUM 9.3 9.5    Studies/Results: Ct Soft Tissue Neck W Contrast  09/07/2012  *RADIOLOGY REPORT*  Clinical Data: Left facial swelling  CT NECK WITH CONTRAST  Technique:  Multidetector CT imaging of the neck was performed with intravenous contrast.  Contrast: 75mL OMNIPAQUE IOHEXOL 300 MG/ML  SOLN  Comparison: 11/21/2005  Findings: Inflammatory changes with stranding/fluid surrounding the left parotid gland (series 3/image 51), which is enlarged relative to the right.  No underlying mass is evident by CT.  No drainable fluid collection/abscess.  No associated salivary gland duct calculus.  The pharyngeal soft tissues are unremarkable.  The airway remains patent.  No suspicious cervical lymphadenopathy.  Mild mucosal thickening in the left maxillary sinus.  Mastoid air cells are clear.  The bilateral orbits, including the retroconal soft tissues, are within normal limits.   The visualized brain parenchyma is unremarkable.  Moderate to severe emphysematous changes in the lung apices.  IMPRESSION: Inflammatory changes involving the left parotid gland, suggesting parotiditis.  No underlying mass is evident by CT.  No drainable fluid collection/abscess.  No associated salivary gland duct calculus.  On the prior 2007 CT, similar findings (although less severe) were present on the right.   Original Report Authenticated By: Charline Bills, M.D.    Dg Chest Port 1 View  09/06/2012  *RADIOLOGY REPORT*  Clinical Data: Chest pain  PORTABLE CHEST - 1 VIEW  Comparison: 11/19/2010  Findings: Cardiomegaly with pulmonary vascular congestion and suspected mild interstitial edema.  Patchy bilateral lower lobe opacities, possibly atelectasis, less likely pneumonia.  No pleural effusion or pneumothorax.  IMPRESSION: Cardiomegaly with suspected mild interstitial edema.  Patchy bilateral lower lobe opacities, possibly atelectasis, less likely pneumonia.   Original Report Authenticated By: Charline Bills, M.D.     Medications: I have reviewed the patient's current medications.  Assessment/Plan: Principal Problem:  *Atrial flutter with rapid ventricular response medical management per cardiology Active Problems:  COPD/Dyspnea with hypoxemia.  Appears to have mild COPD exacerbation with some interstitial edema on CXR, will start prednisone, xopenex and one dose IV lasix.  HTN (hypertension) OK  Hypothyroidism following radioiodine therapy  Parotiditis,improved on clindamycin 2  Transfer to telemetry, OOB, ambulate, have PT see   LOS: 2 days   Caitlin Hampton JOSEPH 09/08/2012, 7:50 AM

## 2012-09-09 DIAGNOSIS — J441 Chronic obstructive pulmonary disease with (acute) exacerbation: Secondary | ICD-10-CM | POA: Diagnosis present

## 2012-09-09 DIAGNOSIS — J9690 Respiratory failure, unspecified, unspecified whether with hypoxia or hypercapnia: Secondary | ICD-10-CM | POA: Diagnosis present

## 2012-09-09 DIAGNOSIS — I5031 Acute diastolic (congestive) heart failure: Secondary | ICD-10-CM | POA: Diagnosis present

## 2012-09-09 LAB — BASIC METABOLIC PANEL
CO2: 28 mEq/L (ref 19–32)
Calcium: 9.2 mg/dL (ref 8.4–10.5)
Creatinine, Ser: 0.72 mg/dL (ref 0.50–1.10)

## 2012-09-09 LAB — PRO B NATRIURETIC PEPTIDE: Pro B Natriuretic peptide (BNP): 1056 pg/mL — ABNORMAL HIGH (ref 0–125)

## 2012-09-09 MED ORDER — ASPIRIN EC 81 MG PO TBEC
81.0000 mg | DELAYED_RELEASE_TABLET | Freq: Every day | ORAL | Status: DC
  Administered 2012-09-09 – 2012-09-12 (×4): 81 mg via ORAL
  Filled 2012-09-09 (×5): qty 1

## 2012-09-09 MED ORDER — ASPIRIN 81 MG PO CHEW
CHEWABLE_TABLET | ORAL | Status: AC
  Filled 2012-09-09: qty 1

## 2012-09-09 MED ORDER — DIGOXIN 0.25 MG/ML IJ SOLN
0.2500 mg | Freq: Four times a day (QID) | INTRAMUSCULAR | Status: AC
  Administered 2012-09-09 (×2): 0.25 mg via INTRAVENOUS
  Filled 2012-09-09 (×3): qty 1

## 2012-09-09 MED ORDER — METHYLPREDNISOLONE SODIUM SUCC 40 MG IJ SOLR
40.0000 mg | Freq: Two times a day (BID) | INTRAMUSCULAR | Status: DC
  Administered 2012-09-09 – 2012-09-10 (×4): 40 mg via INTRAVENOUS
  Filled 2012-09-09 (×7): qty 1

## 2012-09-09 MED ORDER — PANTOPRAZOLE SODIUM 40 MG PO TBEC
40.0000 mg | DELAYED_RELEASE_TABLET | Freq: Every day | ORAL | Status: DC
  Administered 2012-09-09 – 2012-09-12 (×4): 40 mg via ORAL
  Filled 2012-09-09 (×4): qty 1

## 2012-09-09 MED ORDER — LEVALBUTEROL HCL 0.63 MG/3ML IN NEBU
0.6300 mg | INHALATION_SOLUTION | Freq: Three times a day (TID) | RESPIRATORY_TRACT | Status: DC
  Administered 2012-09-09 – 2012-09-10 (×4): 0.63 mg via RESPIRATORY_TRACT
  Filled 2012-09-09 (×8): qty 3

## 2012-09-09 MED ORDER — POTASSIUM CHLORIDE CRYS ER 20 MEQ PO TBCR
40.0000 meq | EXTENDED_RELEASE_TABLET | Freq: Once | ORAL | Status: AC
  Administered 2012-09-09: 40 meq via ORAL
  Filled 2012-09-09: qty 2

## 2012-09-09 MED ORDER — TIOTROPIUM BROMIDE MONOHYDRATE 18 MCG IN CAPS
18.0000 ug | ORAL_CAPSULE | Freq: Every day | RESPIRATORY_TRACT | Status: DC
  Administered 2012-09-10 – 2012-09-12 (×3): 18 ug via RESPIRATORY_TRACT
  Filled 2012-09-09: qty 5

## 2012-09-09 MED ORDER — DIGOXIN 0.25 MG/ML IJ SOLN
0.5000 mg | Freq: Once | INTRAMUSCULAR | Status: AC
  Administered 2012-09-09: 0.5 mg via INTRAVENOUS
  Filled 2012-09-09: qty 2

## 2012-09-09 MED ORDER — FUROSEMIDE 10 MG/ML IJ SOLN
40.0000 mg | Freq: Once | INTRAMUSCULAR | Status: AC
  Administered 2012-09-09: 40 mg via INTRAVENOUS
  Filled 2012-09-09: qty 4

## 2012-09-09 NOTE — Progress Notes (Signed)
Report called to [facility] on 2000. Will send pt after her lunch and after first dose of digixon given.

## 2012-09-09 NOTE — Progress Notes (Signed)
Subjective:  Still SOB  Objective:  Vital Signs in the last 24 hours: Temp:  [97.6 F (36.4 C)-98.6 F (37 C)] 98.5 F (36.9 C) (12/18 0800) Pulse Rate:  [41-144] 100  (12/18 0335) Resp:  [20-27] 22  (12/18 0335) BP: (103-132)/(50-83) 115/68 mmHg (12/18 0335) SpO2:  [87 %-96 %] 87 % (12/18 0800)  Intake/Output from previous day:  Intake/Output Summary (Last 24 hours) at 09/09/12 1610 Last data filed at 09/09/12 0600  Gross per 24 hour  Intake   1170 ml  Output   1250 ml  Net    -80 ml    Physical Exam: General appearance: alert, cooperative, no distress and morbidly obese Lungs: decreased breath sounds, still some faint expiratory wheezing Heart: irregularly irregular rhythm   Rate: 96  Rhythm: atrial fibrillation  Lab Results:  Basename 09/07/12 0540 09/06/12 2215  WBC 11.6* 12.5*  HGB 14.5 15.3*  PLT 219 236    Basename 09/09/12 0518 09/07/12 0540  NA 138 142  K 3.7 3.8  CL 99 105  CO2 28 28  GLUCOSE 111* 122*  BUN 19 10  CREATININE 0.72 0.56    Basename 09/07/12 1244 09/07/12 0926  TROPONINI <0.30 <0.30   Hepatic Function Panel  Basename 09/06/12 2215  PROT 7.1  ALBUMIN 4.0  AST 17  ALT 14  ALKPHOS 70  BILITOT 0.5  BILIDIR --  IBILI --    Basename 09/08/12 0505  CHOL 139   No results found for this basename: INR in the last 72 hours  Imaging: Imaging results have been reviewed  Cardiac Studies:  Assessment/Plan:   Principal Problem:  *Atrial flutter with rapid ventricular response, unknown duration Active Problems:  HTN (hypertension), LVH with NL LVF  Parotiditis, with lt facial swelling, on ABs  Respiratory failure requiring O2, inhalers  COPD with exacerbation  Tobacco abuse  Hypothyroidism following radioiodine therapy, TSH WNL   Obese   Plan- discussed Myoview with MD, will hold off on this for now. She still looks SOB, wheezing improved. No significant diuresis after Lasix 40mg  IV yesterday, repeat Lasix today, check  BNP. Xarelto started, will decrease ASA to 81mg , add PPI. IV steroids started by Primary service for COPD.   Corine Shelter PA-C 09/09/2012, 8:22 AM

## 2012-09-09 NOTE — Progress Notes (Addendum)
I have seen and evaluated the patient this morning along with Corine Shelter, PA. I agree with his findings, examination as well as impression recommendations.  Myoview cancelled due to wheezing & no acute requirement.  Aflutter, as expected is proving to be difficult to "rate-control" - HR went as low as 44 & now in 140s with BID Diltiazem & BB.  Next option for improving rate control is Digoxin.  Anticoagulation with Xarelto initiated with plan for DCCV in 4 weeks.  BNP ~1000 -- agree with IV Lasix this AM (has had ~900 ml out since & lungs sound quite clear now), as RVR may well be contributing to pulmonary edema from probable diastolic dysfunction (cannot assess this on Echo in setting of Afib/Flutter).  Consider short term PO dosing depending on rapidity of successful rate control.  BP remaining stable on high dose CCB, low dose BB & high dose ACE-I.   Rx for likely COPD component per TRH. -- Xopenex, Steroids.  TSH normal.  Marykay Lex, M.D., M.S. [facility] [address] [place], [place]  [address]  [phone] Pager # [phone] 09/09/2012 11:06 AM

## 2012-09-09 NOTE — Progress Notes (Signed)
Pt has order to transfer. Currently eating lunch. Says will be ready after eating.

## 2012-09-09 NOTE — Progress Notes (Signed)
Subjective: Some dyspnea yesterday  Objective: Vital signs in last 24 hours: Temp:  [97.6 F (36.4 C)-98.6 F (37 C)] 98 F (36.7 C) (12/18 0335) Pulse Rate:  [41-144] 100  (12/18 0335) Resp:  [20-27] 22  (12/18 0335) BP: (103-132)/(50-83) 115/68 mmHg (12/18 0335) SpO2:  [90 %-96 %] 91 % (12/18 0511) Weight change:  Last BM Date: 09/06/12  Intake/Output from previous day: 12/17 0701 - 12/18 0700 In: 1200 [P.O.:340; I.V.:860] Out: 1400 [Urine:1400] Intake/Output this shift:    General appearance: alert and cooperative Resp: few wheezes bilaterally Cardio: irregularly irregular rhythm Extremities: extremities normal, atraumatic, no cyanosis or edema  Lab Results:  [facility] 09/07/12 0540 09/06/12 2215  WBC 11.6* 12.5*  HGB 14.5 15.3*  HCT 44.3 45.7  PLT 219 236   BMET  Basename 09/09/12 0518 09/07/12 0540  NA 138 142  K 3.7 3.8  CL 99 105  CO2 28 28  GLUCOSE 111* 122*  BUN 19 10  CREATININE 0.72 0.56  CALCIUM 9.2 9.3    Studies/Results: No results found.  Medications: I have reviewed the patient's current medications.  Assessment/Plan: Principal Problem:  *Atrial flutter with rapid ventricular response medical management per cardiology, rate 120 on metoprolol and diltiazem.  Xarelto started.  Nuclear study today Active Problems:  COPD/Dyspnea with hypoxemia. Mild COPD exacerbation, and still hypoxemic on room air,  Change to iv steroids and add spiriva, continue xopenex and O2 HTN (hypertension) OK  Hypothyroidism following radioiodine therapy  Parotiditis,improved on clindamycin 3, changed to po Transfer to telemetry, OOB, ambulate, will need home PT at D/C   LOS: 3 days   Caitlin Hampton 09/09/2012, 7:55 AM

## 2012-09-09 NOTE — Progress Notes (Signed)
Pt transferred to 2009. Informed Lynelle Smoke (pt's sister)  via phone.

## 2012-09-10 LAB — CBC
MCH: 32.5 pg (ref 26.0–34.0)
MCV: 101.4 fL — ABNORMAL HIGH (ref 78.0–100.0)
Platelets: 199 10*3/uL (ref 150–400)
RDW: 13.8 % (ref 11.5–15.5)
WBC: 8.3 10*3/uL (ref 4.0–10.5)

## 2012-09-10 LAB — BASIC METABOLIC PANEL
BUN: 17 mg/dL (ref 6–23)
CO2: 31 mEq/L (ref 19–32)
Calcium: 9.3 mg/dL (ref 8.4–10.5)
Chloride: 100 mEq/L (ref 96–112)
Creatinine, Ser: 0.56 mg/dL (ref 0.50–1.10)
GFR calc Af Amer: >90 mL/min (ref >90)
GFR calc non Af Amer: >90 mL/min (ref >90)
Glucose, Bld: 138 mg/dL — ABNORMAL HIGH (ref 70–99)
Potassium: 4.6 mEq/L (ref 3.5–5.1)
Sodium: 138 mEq/L (ref 135–145)

## 2012-09-10 MED ORDER — KETOROLAC TROMETHAMINE 30 MG/ML IJ SOLN
30.0000 mg | Freq: Once | INTRAMUSCULAR | Status: AC
  Administered 2012-09-10: 30 mg via INTRAVENOUS
  Filled 2012-09-10: qty 1

## 2012-09-10 MED ORDER — METOPROLOL TARTRATE 25 MG PO TABS
25.0000 mg | ORAL_TABLET | Freq: Three times a day (TID) | ORAL | Status: DC
  Administered 2012-09-10 – 2012-09-12 (×6): 25 mg via ORAL
  Filled 2012-09-10 (×8): qty 1

## 2012-09-10 NOTE — Progress Notes (Signed)
Subjective: Still dyspneic on mild exertion  Objective: Vital signs in last 24 hours: Temp:  [97.1 F (36.2 C)-98.5 F (36.9 C)] 97.8 F (36.6 C) (12/19 0600) Pulse Rate:  [69-144] 70  (12/19 0600) Resp:  [18-20] 18  (12/19 0600) BP: (130-156)/(59-93) 136/75 mmHg (12/19 0600) SpO2:  [87 %-97 %] 96 % (12/19 0600) Weight change:  Last BM Date: 09/09/12  Intake/Output from previous day: 12/18 0701 - 12/19 0700 In: 240 [P.O.:240] Out: 1771 [Urine:1771] Intake/Output this shift:    General appearance: alert and cooperative Resp: decreased breath sounds, no wheezes Cardio: irregularly irregular rhythm Extremities: extremities normal, atraumatic, no cyanosis or edema  Lab Results:  [facility] 09/09/12 2358  WBC 8.3  HGB 14.0  HCT 43.7  PLT 199   BMET  Basename 09/10/12 0535 09/09/12 0518  NA 138 138  K 4.6 3.7  CL 100 99  CO2 31 28  GLUCOSE 138* 111*  BUN 17 19  CREATININE 0.56 0.72  CALCIUM 9.3 9.2    Studies/Results: No results found.  Medications: I have reviewed the patient's current medications.  Assessment/Plan: *Atrial flutter with rapid ventricular response medical management per cardiology, rate about 90 this am on metoprolol and diltiazem and digoxin added. Xarelto started. Nuclear study as outpatient. Given one dose IV lasix  Active Problems:  COPD/Dyspnea with hypoxemia. Mild COPD exacerbation, on 2 liters O2, Continue iv steroids and spiriva/ xopenex, may need home O2 HTN (hypertension) OK  Hypothyroidism following radioiodine therapy  Parotiditis,improved on clindamycin 4 OOB today, ambulate, will need home PT at D/C, possible discharge in AM     LOS: 4 days   Sho Salguero JOSEPH 09/10/2012, 7:53 AM

## 2012-09-10 NOTE — Progress Notes (Signed)
Subjective:  No complaints. Denies palpitations, lightheadedness/dizziness, CP/SOB.  Objective:  Vital Signs in the last 24 hours: Temp:  [97.1 F (36.2 C)-98.3 F (36.8 C)] 97.8 F (36.6 C) (12/19 0600) Pulse Rate:  [69-137] 70  (12/19 0600) Resp:  [18-20] 18  (12/19 0600) BP: (130-156)/(75-93) 136/75 mmHg (12/19 0600) SpO2:  [92 %-97 %] 96 % (12/19 0600)  Intake/Output from previous day:  Intake/Output Summary (Last 24 hours) at 09/10/12 1054 Last data filed at 09/10/12 0900  Gross per 24 hour  Intake    480 ml  Output   1531 ml  Net  -1051 ml      . aspirin EC  81 mg Oral Daily  . clindamycin  300 mg Oral Q8H  . diltiazem  240 mg Oral BID  . levalbuterol  0.63 mg Nebulization TID  . levothyroxine  100 mcg Oral QAC breakfast  . lisinopril  40 mg Oral Daily  . methylPREDNISolone (SOLU-MEDROL) injection  40 mg Intravenous Q12H  . metoprolol tartrate  25 mg Oral BID  . nicotine  14 mg Transdermal Daily  . pantoprazole  40 mg Oral Q0600  . rivaroxaban  20 mg Oral Q breakfast  . sodium chloride  3 mL Intravenous Q12H  . sodium chloride  3 mL Intravenous Q12H  . tiotropium  18 mcg Inhalation Daily   Physical Exam: General appearance: alert, cooperative and no distress Lungs: clear to auscultation bilaterally and no wheezing noted Heart: irregularly irregular rhythm and distant heart sounds Extremities: no LEE Pulses: 2+ and symmetric Skin: cool and dry Neurologic: Grossly normal   Rate: 70  Rhythm: atrial fibrillation  Lab Results:  Basename 09/09/12 2358  WBC 8.3  HGB 14.0  PLT 199    Basename 09/10/12 0535 09/09/12 0518  NA 138 138  K 4.6 3.7  CL 100 99  CO2 31 28  GLUCOSE 138* 111*  BUN 17 19  CREATININE 0.56 0.72    Basename 09/07/12 1244  TROPONINI <0.30   Hepatic Function Panel No results found for this basename: PROT,ALBUMIN,AST,ALT,ALKPHOS,BILITOT,BILIDIR,IBILI in the last 72 hours  Basename 09/08/12 0505  CHOL 139   No results found  for this basename: INR in the last 72 hours  Imaging: Imaging results have been reviewed  Cardiac Studies:  Assessment/Plan:   Principal Problem:  *Atrial flutter with rapid ventricular response, unknown duration Active Problems:  HTN (hypertension), LVH with NL LVF  Parotiditis, with lt facial swelling, on ABs  Respiratory failure requiring O2, inhalers  COPD with exacerbation  Acute diastolic CHF (congestive heart failure)  Tobacco abuse  Hypothyroidism following radioiodine therapy (in her 20's), TSH WNL   Obese   Plan: A-fib on telemetry. Max rate of 137 yesterday at 19:58. Pt has been asymptomatic during episodes. Rate has been controlled today with ranges in the 60s-70s. Currently on  Diltiazem 240 mg PO BID and Lopressor 25 mg PO BID for rate control.  Xarelto was initiated yesterday for anticoagulation. Plan is for possible DCCV after 4 weeks of anticoagulation. No wheezing noted on lung exam. Consider doing Lexiscan nuc test vs waiting as OP. BP is stable. Labs ok. Will continue to monitor afib/flutter. MD to follow.   Corine Shelter PA-C 09/10/2012, 10:54 AM    Patient seen and examined. Agree with assessment and plan. AF rate still not well controlled.  No wheezing on exam today.  Will titrate lopressor to 25 mg tid today and consider titrating to 50 mg bid if BP and pulmonary status allow.  Lennette Bihari, MD, [facility] 09/10/2012 12:12 PM

## 2012-09-10 NOTE — Progress Notes (Signed)
Physical Therapy Treatment Patient Details Name: Caitlin Hampton MRN: [id number] DOB: [birth year] Today's Date: 09/10/2012 Time: 9147-8295 PT Time Calculation (min): 24 min  PT Assessment / Plan / Recommendation Comments on Treatment Session  Pt very limited due to back pain and left groin pain.  Pt stated "I lifted my left leg wrong and now my groin really hurts."  Pt ambulated with antalgic gait and RN notified.  Pts Sa02 decreased to 87% on RA and increased to 93% on 2L during ambulation.    Follow Up Recommendations  Home health PT     Does the patient have the potential to tolerate intense rehabilitation     Barriers to Discharge        Equipment Recommendations  Rolling walker with 5" wheels    Recommendations for Other Services    Frequency Min 3X/week   Plan Discharge plan remains appropriate;Frequency remains appropriate    Precautions / Restrictions Precautions Precautions: Fall   Pertinent Vitals/Pain C/o 7/10 left groin pain; RN notified    Mobility  Bed Mobility Bed Mobility: Not assessed Transfers Sit to Stand: 5: Supervision;From chair/3-in-1 Stand to Sit: 5: Supervision;To chair/3-in-1 Details for Transfer Assistance: Supervision for safety with cues for hand placement.  Pt needed extra time due to back pain. Ambulation/Gait Ambulation/Gait Assistance: 5: Supervision Ambulation Distance (Feet): 125 Feet Assistive device: Rolling walker Ambulation/Gait Assistance Details: Pt needed intermittent rest breaks due to pain in back and groin.  Pt continues to lean forward onto RW.   Gait Pattern: Step-to pattern;Shuffle;Antalgic;Lateral trunk lean to right Gait velocity: decreased Stairs: No    Exercises     PT Diagnosis:    PT Problem List:   PT Treatment Interventions:     PT Goals Acute Rehab PT Goals PT Goal Formulation: With patient/family Time For Goal Achievement: 09/15/12 Potential to Achieve Goals: Good Pt will go Supine/Side to Sit: with  modified independence;with HOB 0 degrees PT Goal: Supine/Side to Sit - Progress: Progressing toward goal Pt will go Sit to Supine/Side: with modified independence;with HOB 0 degrees PT Goal: Sit to Supine/Side - Progress: Progressing toward goal Pt will go Sit to Stand: with modified independence PT Goal: Sit to Stand - Progress: Progressing toward goal Pt will go Stand to Sit: with modified independence PT Goal: Stand to Sit - Progress: Progressing toward goal Pt will Ambulate: >150 feet;with modified independence;with least restrictive assistive device PT Goal: Ambulate - Progress: Progressing toward goal  Visit Information  Last PT Received On: 09/10/12 Assistance Needed: +1    Subjective Data  Subjective: "My left groin is hurting.  I picked my leg up weird and now I'm hurting." Patient Stated Goal: To stop my back from hurting and to return to my home   Cognition  Overall Cognitive Status: Appears within functional limits for tasks assessed/performed Arousal/Alertness: Awake/alert Orientation Level: Appears intact for tasks assessed Behavior During Session: [facility] for tasks performed    Balance     End of Session PT - End of Session Equipment Utilized During Treatment: Gait belt Activity Tolerance: Patient limited by pain (New onset of back pain) Patient left: in chair;with call bell/phone within reach;with nursing in room (RN notified of new onset of back pain and pt request pain me) Nurse Communication: Mobility status   GP     Randall Rampersad 09/10/2012, 1:17 PM Jake Shark, PT DPT [phone]

## 2012-09-11 MED ORDER — DIGOXIN 250 MCG PO TABS
0.2500 mg | ORAL_TABLET | Freq: Every day | ORAL | Status: DC
  Administered 2012-09-11: 0.25 mg via ORAL
  Filled 2012-09-11 (×3): qty 1

## 2012-09-11 MED ORDER — METOPROLOL TARTRATE 1 MG/ML IV SOLN
5.0000 mg | Freq: Once | INTRAVENOUS | Status: AC
  Administered 2012-09-11: 5 mg via INTRAVENOUS
  Filled 2012-09-11: qty 5

## 2012-09-11 MED ORDER — DIGOXIN 0.25 MG/ML IJ SOLN
0.5000 mg | Freq: Once | INTRAMUSCULAR | Status: AC
  Administered 2012-09-11: 0.5 mg via INTRAVENOUS
  Filled 2012-09-11: qty 2

## 2012-09-11 MED ORDER — PREDNISONE 20 MG PO TABS
40.0000 mg | ORAL_TABLET | Freq: Every day | ORAL | Status: DC
  Administered 2012-09-12: 40 mg via ORAL
  Filled 2012-09-11 (×2): qty 2

## 2012-09-11 NOTE — Progress Notes (Signed)
I have seen and evaluated the patient this afternoon along with Nada Boozer, NP. I agree with her findings, examination as well as impression recommendations.  Difficult situation with recurrent difficulties treating Aflutter Ventricular response -- rates again in 140s despite high dose CCB & increased BB dose.  Not symptomatic, but also not optimal to consider d/c with such labile HR.  Would monitor at least 1 more day to ensure stable rates < 110 bpm.  Will reload with IV Digoxin & start standing PO Digoxin for additional rate control.  Will consider increasing BB dose further tomorrow.  If rates continue to be difficult to control, would strongly consider TEE guided DCCV -- which she is not ready for yet with pulmonary & parotiditis infection issues; could be done early next week (would even consider setting up as OP procedure.) -- would then strongly consider EP c/s for Aflutter Ablation.  On Xarelto for anticoagulation, if able to get stable rate control, would plan OP DCCV in ~4 weeks.   As there are no active bleeding issues, would keep on 81mg  ASA until OP Nuclear ST done.  Pulmonary Rx for likely COPD exacerbation - per TRH.   Marykay Lex, M.D., M.S. [facility] [address] [place], [place]  [address]  [phone] Pager # [phone] 09/11/2012 12:52 PM

## 2012-09-11 NOTE — Progress Notes (Signed)
Subjective: Felt better yesterday ambulating.  Objective: Vital signs in last 24 hours: Temp:  [97.7 F (36.5 C)-97.9 F (36.6 C)] 97.7 F (36.5 C) (12/20 0539) Pulse Rate:  [70-143] 71  (12/20 0539) Resp:  [20] 20  (12/20 0539) BP: (128-138)/(64-87) 138/70 mmHg (12/20 0539) SpO2:  [91 %-94 %] 91 % (12/20 0539) Weight change:  Last BM Date: 09/09/12  Intake/Output from previous day: 12/19 0701 - 12/20 0700 In: 483 [P.O.:480; I.V.:3] Out: 725 [Urine:725] Intake/Output this shift:    General appearance: alert and cooperative Resp: decreased breath sounds  Cardio: irregularly irregular rhythm Extremities: extremities normal, atraumatic, no cyanosis or edema  Lab Results:  [facility] 09/09/12 2358  WBC 8.3  HGB 14.0  HCT 43.7  PLT 199   BMET  Basename 09/10/12 0535 09/09/12 0518  NA 138 138  K 4.6 3.7  CL 100 99  CO2 31 28  GLUCOSE 138* 111*  BUN 17 19  CREATININE 0.56 0.72  CALCIUM 9.3 9.2    Studies/Results: No results found.  Medications: I have reviewed the patient's current medications.  Assessment/Plan: Atrial flutter with rapid ventricular response medical management per cardiology, rate still 110-120 this am on metoprolol and diltiazem and digoxin added. Xarelto started. Nuclear study as outpatient. Xopenex may be driving heart rate, discontinue Active Problems:  COPD/Dyspnea with hypoxemia. Mild COPD exacerbation improving. Change to po steroids and continue spiriva, d/c xopenex.  Will need home O2 (RA sat 88) HTN (hypertension) OK  Hypothyroidism following radioiodine therapy  Parotiditis,improved on clindamycin 5/7  OOB today, ambulate, will need home PT at D/C, possible discharge today or tomorrow depending on cardiology input.   LOS: 5 days   [facility]  [phone] 09/11/2012, 7:44 AM

## 2012-09-11 NOTE — Progress Notes (Signed)
Subjective: No complaints, no awareness of HR, currently while sitting in the chair 145.  Objective: Vital signs in last 24 hours: Temp:  [97.7 F (36.5 C)-97.9 F (36.6 C)] 97.7 F (36.5 C) (12/20 0539) Pulse Rate:  [70-143] 71  (12/20 0539) Resp:  [20] 20  (12/20 0539) BP: (128-138)/(64-87) 138/70 mmHg (12/20 0539) SpO2:  [91 %-94 %] 94 % (12/20 0756) Weight change:  Last BM Date: 09/09/12 Intake/Output from previous day: -242 12/19 0701 - 12/20 0700 In: 483 [P.O.:480; I.V.:3] Out: 725 [Urine:725] Intake/Output this shift:    PE: General:alert and oriented, pleasant affect, no acute distress   Heart:irreg irreg Lungs:clear though diminished in the bases Abd:+ BS, soft, non tender Ext:no edema   Lab Results:  [facility] 09/09/12 2358  WBC 8.3  HGB 14.0  HCT 43.7  PLT 199   BMET  Basename 09/10/12 0535 09/09/12 0518  NA 138 138  K 4.6 3.7  CL 100 99  CO2 31 28  GLUCOSE 138* 111*  BUN 17 19  CREATININE 0.56 0.72  CALCIUM 9.3 9.2   No results found for this basename: TROPONINI:2,CK,MB:2 in the last 72 hours  Lab Results  Component Value Date   CHOL 139 09/08/2012   HDL 40 09/08/2012   LDLCALC 85 09/08/2012   TRIG 71 09/08/2012   CHOLHDL 3.5 09/08/2012   Lab Results  Component Value Date   HGBA1C 5.8* 09/07/2012     Lab Results  Component Value Date   TSH 2.571 09/06/2012      Studies/Results: ECHO  09/07/12: Left ventricle: The cavity size was normal. Wall thickness was increased. There was hypertrophy, with an appearance suggesting concentric remodeling (increased wall thickness with normal wall mass). Systolic function was normal. The estimated ejection fraction was in the range of 55% to 65%. Although no diagnostic regional wall motion abnormality was identified, this possibility cannot be completely excluded on the basis of this study. The study is not technically sufficient to allow evaluation of LV diastolic function. - Aortic valve:  Mild focal calcification involving the noncoronary cusp. - Mitral valve: Mild regurgitation. - Pericardium, extracardiac: A trivial, free-flowing pericardial effusion was identified circumferential to the heart and at the apex. The fluid had no internal echoes.There was no evidence of hemodynamic compromise. Impressions:  - An atrial fibrillation rhythm with rapid Ventricular Ratewas noted on this study, making this study technically difficult for the assessment of cardiac function   Medications: I have reviewed the patient's current medications.    Marland Kitchen aspirin EC  81 mg Oral Daily  . clindamycin  300 mg Oral Q8H  . diltiazem  240 mg Oral BID  . levothyroxine  100 mcg Oral QAC breakfast  . lisinopril  40 mg Oral Daily  . metoprolol tartrate  25 mg Oral TID  . nicotine  14 mg Transdermal Daily  . pantoprazole  40 mg Oral Q0600  . predniSONE  40 mg Oral Q breakfast  . rivaroxaban  20 mg Oral Q breakfast  . sodium chloride  3 mL Intravenous Q12H  . sodium chloride  3 mL Intravenous Q12H  . tiotropium  18 mcg Inhalation Daily   Assessment/Plan: Principal Problem:  *Atrial flutter with rapid ventricular response, unknown duration Active Problems:  Tobacco abuse  HTN (hypertension), LVH with NL LVF  Hypothyroidism following radioiodine therapy (in her 20's), TSH WNL   Parotiditis, with lt facial swelling, on ABs  Obese  Respiratory failure requiring O2, inhalers  COPD with exacerbation  Acute diastolic CHF (  congestive heart failure)  PLAN: HR still elevated.  Does she need ASA and Xarelto?  Or wait until Nuc study completed? Increase cardizem or BB? - now on steroids.  Outpt nuc study.   LOS: 5 days   Rubel Heckard R 09/11/2012, 8:17 AM

## 2012-09-11 NOTE — Progress Notes (Signed)
Physical Therapy Treatment Patient Details Name: Caitlin Hampton MRN: [id number] DOB: May 29, 1946 Today's Date: 09/11/2012 Time: 4098-1191 PT Time Calculation (min): 20 min  PT Assessment / Plan / Recommendation Comments on Treatment Session  Patient continued to be limited by hip pain.  Continued to have drop in O2 sat with gait on room air (84%).  Nursing notified.    Follow Up Recommendations  Home health PT     Does the patient have the potential to tolerate intense rehabilitation     Barriers to Discharge        Equipment Recommendations  Rolling walker with 5" wheels    Recommendations for Other Services    Frequency Min 3X/week   Plan Discharge plan remains appropriate;Frequency remains appropriate    Precautions / Restrictions Precautions Precautions: Fall Restrictions Weight Bearing Restrictions: No   Pertinent Vitals/Pain Pain limiting mobility today.    Mobility  Bed Mobility Bed Mobility: Not assessed Transfers Transfers: Sit to Stand;Stand to Sit Sit to Stand: 5: Supervision;With upper extremity assist;With armrests;From chair/3-in-1 Stand to Sit: 5: Supervision;With upper extremity assist;With armrests;To chair/3-in-1 Details for Transfer Assistance: Supervision for safety with cues for hand placement.  Pt needed extra time due to hip pain. Ambulation/Gait Ambulation/Gait Assistance: 4: Min guard Ambulation Distance (Feet): 120 Feet Assistive device: Rolling walker Ambulation/Gait Assistance Details: Verbal and tactile cues to stand upright and stay closer to RW.  Patient reports hip pain making her legs weak.  Needed 2 standing rest breaks.   Gait Pattern: Step-through pattern;Decreased stride length;Shuffle;Antalgic;Trunk flexed Gait velocity: decreased General Gait Details: Patient ambulated on room air - O2 sat decreased to 84%.  Reapplied O2, and sat level returned to 95%. Stairs: No (Patient declined)      PT Goals Acute Rehab PT Goals PT Goal:  Sit to Stand - Progress: Progressing toward goal PT Goal: Stand to Sit - Progress: Progressing toward goal PT Goal: Ambulate - Progress: Progressing toward goal  Visit Information  Last PT Received On: 09/11/12 Assistance Needed: +1    Subjective Data  Subjective: "I don't think they are going to let me go home with my heart rate high."   Cognition  Overall Cognitive Status: Appears within functional limits for tasks assessed/performed Arousal/Alertness: Awake/alert Orientation Level: Appears intact for tasks assessed Behavior During Session: [facility] for tasks performed    Balance     End of Session PT - End of Session Equipment Utilized During Treatment: Gait belt Activity Tolerance: Patient limited by pain;Patient limited by fatigue Patient left: in chair;with call bell/phone within reach Nurse Communication: Mobility status (O2 sat and HR with gait.)   GP     Vena Austria 09/11/2012, 2:51 PM Durenda Hurt. Renaldo Fiddler, [facility] Acute Rehab Services Pager [phone]

## 2012-09-12 MED ORDER — TIOTROPIUM BROMIDE MONOHYDRATE 18 MCG IN CAPS: 18.0000 ug | ORAL_CAPSULE | Freq: Every day | RESPIRATORY_TRACT | Status: DC

## 2012-09-12 MED ORDER — DIGOXIN 250 MCG PO TABS: 0.2500 mg | ORAL_TABLET | Freq: Every day | ORAL | Status: DC

## 2012-09-12 MED ORDER — PREDNISONE 20 MG PO TABS: 20.0000 mg | ORAL_TABLET | Freq: Every day | ORAL | Status: DC

## 2012-09-12 MED ORDER — DILTIAZEM HCL ER COATED BEADS 240 MG PO CP24: 240.0000 mg | ORAL_CAPSULE | Freq: Two times a day (BID) | ORAL | Status: DC

## 2012-09-12 MED ORDER — ALBUTEROL SULFATE HFA 108 (90 BASE) MCG/ACT IN AERS: 2.0000 | INHALATION_SPRAY | Freq: Four times a day (QID) | RESPIRATORY_TRACT | Status: DC | PRN

## 2012-09-12 MED ORDER — METOPROLOL TARTRATE 25 MG PO TABS: 25.0000 mg | ORAL_TABLET | Freq: Two times a day (BID) | ORAL | Status: DC

## 2012-09-12 MED ORDER — CLINDAMYCIN HCL 300 MG PO CAPS: 300.0000 mg | ORAL_CAPSULE | Freq: Three times a day (TID) | ORAL | Status: DC

## 2012-09-12 MED ORDER — RIVAROXABAN 20 MG PO TABS: 20.0000 mg | ORAL_TABLET | Freq: Every day | ORAL | Status: DC

## 2012-09-12 NOTE — Progress Notes (Signed)
Subjective:  Up in room, denies SOB, still on O2  Objective:  Vital Signs in the last 24 hours: Temp:  [97.5 F (36.4 C)-98 F (36.7 C)] 98 F (36.7 C) (12/21 0419) Pulse Rate:  [61-143] 66  (12/21 0419) Resp:  [19-20] 19  (12/21 0419) BP: (113-139)/(61-86) 113/68 mmHg (12/21 0419) SpO2:  [84 %-97 %] 90 % (12/21 0731)  Intake/Output from previous day:  Intake/Output Summary (Last 24 hours) at 09/12/12 0814 Last data filed at 09/12/12 0747  Gross per 24 hour  Intake    720 ml  Output    600 ml  Net    120 ml    Physical Exam: General appearance: alert, cooperative, no distress and morbidly obese Lungs: decreased breath sounds Heart: irregularly irregular rhythm   Rate: 78  Rhythm: atrial fibrillation  Lab Results:  Basename 09/09/12 2358  WBC 8.3  HGB 14.0  PLT 199    Basename 09/10/12 0535  NA 138  K 4.6  CL 100  CO2 31  GLUCOSE 138*  BUN 17  CREATININE 0.56   No results found for this basename: TROPONINI:2,CK,MB:2 in the last 72 hours Hepatic Function Panel No results found for this basename: PROT,ALBUMIN,AST,ALT,ALKPHOS,BILITOT,BILIDIR,IBILI in the last 72 hours No results found for this basename: CHOL in the last 72 hours No results found for this basename: INR in the last 72 hours  Imaging: Imaging results have been reviewed  Cardiac Studies:  Assessment/Plan:   Principal Problem:  *Atrial flutter with rapid ventricular response, unknown duration Active Problems:  HTN (hypertension), LVH with NL LVF  Parotiditis, with lt facial swelling, on ABs  Respiratory failure requiring O2, inhalers  COPD with exacerbation  Acute diastolic CHF (congestive heart failure)  Tobacco abuse  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 It seems her pulmonary status is the limiting factor now.    Corine Shelter PA-C 09/12/2012, 8:14 AM    Agree with note  written by Corine Shelter [facility]  PAFlutter with 4:1 conduction. On Xarelto, BB, CCB and dig. Nl 2D echo. On ATBX and steroids. Hopefully will convert to NSR spontaneously. Otherwise 4 weeks OP anticoagulation and DCCV. ROV with Dr. Dwaine Deter after D/C.  Runell Gess 09/12/2012 8:27 AM

## 2012-09-12 NOTE — Discharge Summary (Signed)
Physician Discharge Summary  Patient ID: Caitlin Hampton MRN: [id number] DOB/AGE: 66-Mar-1947 66 y.o.  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 In the ER she was found to be in atrial flutter with rapid ventricular response and was started on Cardizem infusion and bolus. Her rate being persistently high and she was admitted to the hospital to the primary care team with consultation from [facility] cardiology group. At admission the patient's WBC was 12.5 with normal hemoglobin and platelets. Her chemistry showed normal BUN creatinine and liver function tests and cardiac enzymes. CT scan of the neck showed inflammatory changes involving the left parotid gland suggesting parotiditis, no underlying mass or abscess. There is no salivary duct calculus. A chest x-ray showed cardiomegaly with suspected mild interstitial edema and patchy bilateral lower lobe opacities likely atelectasis. The patient was admitted and started on Cardizem infusion. Her rate proved difficult to control and she was eventually switched to by mouth diltiazem and by mouth Lopressor was added to her regimen. Xarelto also started for anticoagulation. An echocardiogram was done on December 16 and showed normal left ventricular size, moderate LVH and  normal systolic function with ejection fraction estimated 55-65%. There was mild mitral regurgitation and a trivial pericardial effusion. The patient's rate ventricular response remained difficult to control and she was given IV and then oral digoxin. She was also given 2 doses of IV Lasix for pulmonary interstitial edema., Hopefully temporary Her Xopenex which was being given for COPD was discontinued. At discharge on a combination of high dose Cardizem, low dose metoprolol and digoxin her rate was under good control with final rhythm atrial flutter with rate of 74. An ischemic workup may be pursued as an outpatient by  [facility] cardiology. The patient was felt to have a mild COPD exacerbation and she was coughing and bringing up some sputum. She was consistent hypoxemic with oxygen saturation in the mid to high 80s on room air. She was started on Solu-Medrol and then changed to by mouth prednisone. She was treated with bronchodilators including Xopenex and started on Spiriva. Her dyspnea gradually improved and she was able to ambulate on the floor with physical therapy at discharge. She still was hypoxemic on room air discharge and will be discharged on home oxygen. She was treated with nicotine patch during hospitalization and strongly counseled to discontinue smoking. She was treated with clindamycin initially IV and then by mouth for her parotiditis with significant improvement in her pain and swelling. The rest of her medical issues remained stable during his hospitalization.  Discharge diet low-sodium Activity as per physical therapy and occupational therapy CODE STATUS full code Oxygen 2 L nasal cannula  Consults: cardiology  Significant Diagnostic Studies: labs: As above, radiology: CXR: Mild interstitial edema and atelectasis and CT scan: As above and cardiac graphics: Echocardiogram: Results as above  Treatments: antibiotics: Clindamycin, cardiac meds: metoprolol, diltiazem, digoxin and  furosemide, anticoagulation: xarelto and steroids: solu-medrol and  prednisone  Discharge Exam: Blood pressure 134/74, pulse 75, temperature 98 F (36.7 C), temperature source Oral, resp. rate 19, height 5\' 4"  (1.626 m), weight 96.6 kg (212 lb 15.4 oz), SpO2 90.00%. General appearance: alert and cooperative Resp: clear to auscultation bilaterally Cardio: regular rate and rhythm  Disposition: Discharge to home, will be staying with sister first few days  Discharge Orders    Future Appointments: Provider: Department: Dept Phone: Center:   10/05/2012 12:40 PM [facility] [phone] [facility]       Medication List     As of 09/12/2012 11:09 AM    STOP taking these medications         diltiazem 240 MG 24 hr capsule   Commonly known as: DILACOR XR      TAKE these medications         acetaminophen 500 MG tablet   Commonly known as: TYLENOL   Take 1,000 mg by mouth every 6 (six) hours as needed. For pain      albuterol 108 (90 BASE) MCG/ACT inhaler   Commonly known as: PROVENTIL HFA;VENTOLIN HFA   Inhale 2 puffs into the lungs every 6 (six) hours as needed for wheezing.      clindamycin 300 MG capsule   Commonly known as: CLEOCIN   Take 1 capsule (300 mg total) by mouth every 8 (eight) hours.      digoxin 0.25 MG tablet   Commonly known as: LANOXIN   Take 1 tablet (0.25 mg total) by mouth daily.      diltiazem 240 MG 24 hr capsule   Commonly known as: CARDIZEM CD   Take 1 capsule (240 mg total) by mouth 2 (two) times daily.      levothyroxine 100 MCG tablet   Commonly known as: SYNTHROID, LEVOTHROID   Take 100 mcg by mouth daily.      lisinopril 40 MG tablet   Commonly known as: PRINIVIL,ZESTRIL   Take 40 mg by mouth daily.      metoprolol tartrate 25 MG tablet   Commonly known as: LOPRESSOR   Take 1 tablet (25 mg total) by mouth 2 (two) times daily.      predniSONE 20 MG tablet   Commonly known as: DELTASONE   Take 1  tablet (20 mg total) by mouth daily with breakfast.      ranitidine 75 MG tablet   Commonly known as: ZANTAC   Take 75 mg by mouth 2 (two) times daily.      Rivaroxaban 20 MG Tabs   Commonly known as: XARELTO   Take 1 tablet (20 mg total) by mouth daily with breakfast.      tiotropium 18 MCG inhalation capsule   Commonly known as: SPIRIVA   Place 1 capsule (18 mcg total) into inhaler and inhale daily.           Follow-up Information    Follow up with Raylene Miyamoto. (office will call)    Contact information:   [address] [place] [address]       Follow up with Georgann Housekeeper, MD. In 10 days.   Contact information:   [address] [place] [address] [phone]          Signed: Lillia Mountain 09/12/2012, 11:09 AM

## 2012-09-14 ENCOUNTER — Other Ambulatory Visit (HOSPITAL_COMMUNITY): Payer: Self-pay | Admitting: Cardiology

## 2012-09-30 ENCOUNTER — Encounter (HOSPITAL_COMMUNITY): Payer: Self-pay | Admitting: *Deleted

## 2012-09-30 ENCOUNTER — Emergency Department (INDEPENDENT_AMBULATORY_CARE_PROVIDER_SITE_OTHER)
Admission: EM | Admit: 2012-09-30 | Discharge: 2012-09-30 | Disposition: A | Discharge status: Home / Self Care | Payer: Medicare Other | Source: Home / Self Care | Attending: Family Medicine | Admitting: Family Medicine

## 2012-09-30 DIAGNOSIS — R040 Epistaxis: Secondary | ICD-10-CM

## 2012-09-30 NOTE — ED Provider Notes (Signed)
History     CSN: [id number]  Arrival date & time 09/30/12  1738   First MD Initiated Contact with Patient 09/30/12 1749      Chief Complaint  Patient presents with  . Epistaxis    (Consider location/radiation/quality/duration/timing/severity/associated sxs/prior treatment) Patient is a 67 y.o. female presenting with nosebleeds. The history is provided by the patient and a relative.  Epistaxis  This is a new problem. The current episode started 3 to 5 hours ago. The problem has been resolved. The problem is associated with anticoagulants. The bleeding has been from the left nare. She has tried applying pressure (bleeding stopped with pressure, pt concerned about being on anticoag.) for the symptoms. The treatment provided significant relief. Past medical history comments: recently on nasal ox with afib.Marland Kitchen    Past Medical History  Diagnosis Date  . Hypertension   . Thyroid disease   . COPD (chronic obstructive pulmonary disease)   . Anxiety   . H/O hiatal hernia   . Hypothyroidism   . Shortness of breath   . Atrial flutter 09/07/2012    Past Surgical History  Procedure Date  . Breast surgery     benign R brest CA  . Tonsillectomy     Family History  Problem Relation Age of Onset  . CAD Mother   . Stroke Mother   . Prostate cancer Father     History  Substance Use Topics  . Smoking status: Current Every Day Smoker -- 1.5 packs/day    Types: Cigarettes  . Smokeless tobacco: Not on file  . Alcohol Use: No    OB History    Grav Para Term Preterm Abortions TAB SAB Ect Mult Living                  Review of Systems  Constitutional: Negative.   HENT: Positive for nosebleeds. Negative for congestion and rhinorrhea.   Respiratory: Negative for chest tightness.   Cardiovascular: Positive for palpitations.    Allergies  Review of patient's allergies indicates no known allergies.  Home Medications   Current Outpatient Rx  Name  Route  Sig  Dispense  Refill  .  ACETAMINOPHEN 500 MG PO TABS   Oral   Take 1,000 mg by mouth every 6 (six) hours as needed. For pain         . DIGOXIN 0.25 MG PO TABS   Oral   Take 1 tablet (0.25 mg total) by mouth daily.   30 tablet   11   . DILTIAZEM HCL ER COATED BEADS 240 MG PO CP24   Oral   Take 1 capsule (240 mg total) by mouth 2 (two) times daily.   60 capsule   11   . LEVOTHYROXINE SODIUM 100 MCG PO TABS   Oral   Take 100 mcg by mouth daily.         Marland Kitchen LISINOPRIL 40 MG PO TABS   Oral   Take 40 mg by mouth daily.         Marland Kitchen RANITIDINE HCL 75 MG PO TABS   Oral   Take 75 mg by mouth 2 (two) times daily.         Marland Kitchen RIVAROXABAN 20 MG PO TABS   Oral   Take 1 tablet (20 mg total) by mouth daily with breakfast.   30 tablet   11   . TIOTROPIUM BROMIDE MONOHYDRATE 18 MCG IN CAPS   Inhalation   Place 1 capsule (18 mcg total) into inhaler  and inhale daily.   30 capsule   11   . ALBUTEROL SULFATE HFA 108 (90 BASE) MCG/ACT IN AERS   Inhalation   Inhale 2 puffs into the lungs every 6 (six) hours as needed for wheezing.   1 Inhaler   5   . CLINDAMYCIN HCL 300 MG PO CAPS   Oral   Take 1 capsule (300 mg total) by mouth every 8 (eight) hours.   6 capsule   0   . METOPROLOL TARTRATE 25 MG PO TABS   Oral   Take 1 tablet (25 mg total) by mouth 2 (two) times daily.   60 tablet   11   . PREDNISONE 20 MG PO TABS   Oral   Take 1 tablet (20 mg total) by mouth daily with breakfast.   5 tablet   0     2 a day for 1 day, 1 a day for 2 days, 1/2 a day f ...     BP 137/81  Pulse 130  Temp 98.8 F (37.1 C) (Oral)  Resp 28  SpO2 91%  Physical Exam  Nursing note and vitals reviewed. Constitutional: She appears well-developed and well-nourished.  HENT:  Head: Normocephalic.  Right Ear: External ear normal.  Left Ear: External ear normal.  Nose: No mucosal edema, rhinorrhea, septal deviation or nasal septal hematoma. No epistaxis.  Mouth/Throat: Oropharynx is clear and moist.    Cardiovascular: Normal pulses.  An irregularly irregular rhythm present.    ED Course  Procedures (including critical care time)  Labs Reviewed - No data to display No results found.   1. Anterior epistaxis       MDM  No bleeding at d/c, pt comfortable.        Linna Hoff, MD 09/30/12 [phone]

## 2012-09-30 NOTE — ED Notes (Signed)
C/o nose bleed onset 1630.  She held her nose and by 1730 when she got here it was stopped.

## 2012-10-05 ENCOUNTER — Ambulatory Visit: Payer: Self-pay

## 2012-10-06 ENCOUNTER — Other Ambulatory Visit: Payer: Self-pay | Admitting: Cardiology

## 2012-10-08 NOTE — H&P (Signed)
Patient: Caitlin Hampton, Caitlin Hampton Provider: Donato Schultz, MD  DOB: [birth year] Age: 67 Y Sex: Female Date: 09/28/2012  Phone: [phone]   Address: [address], [place], [place]  Pcp: Georgann Housekeeper       Subjective:     CC:    1. REFERRED DR Donette Larry [facility].        HPI:  General:  67 year old here for evaluation of atrial flutter currently on anticoagulation with Xarelto. She was hospitalized on 12/13 for rapid ventricular response atrial fibrillation. Currently on diltiazem, metoprolol, digoxin. Her original complaint was swelling of her neck and face. She was found to be in atrial flutter with rapid ventricular response and started on Cardizem. She was admitted and had consultation by [facility] cardiology. Lopressor was added. Echocardiogram was done on December 16 which showed normal left adductor ejection fraction of 65%, moderate LVH. Mild mitral regurgitation, trivial pericardial effusion. She was given digoxin because of difficult to control heart rate. She was also given 2 doses of Lasix for pulmonary edema. At discharge, her heart rate was 74 with atrial flutter. Overall she was asymptomatic with her atrial flutter. She has baseline dyspnea which he attributes to her smoking/COPD. Her parotid gland inflammation has decreased..        ROS:  No snoring, no falls, no syncope. She did have a mild fall in the hospital. Mechanical. No chest pain. She works as a Interior and spatial designer. One of her clients/friends is here with her today and has noticed some minor wheezing. No bleeding. No weight loss. All others negative.       Medical History: Hypertension, GERD, Hypothyroid, Tobacco use, COPD; copd exac 2013, Rhinitis, Atrial flutter- RVR/ hospital 12/13- rate control and Echo normal- on xarelto, parotitis on CT of face- 12/13.        Gyn History:  Last mammogram date Recommend getting mammogram,, more than two years.        OB History:        Family History:  Father: deceased 53 yrs DEMENTIA, IN [place] Mother: deceased Brother 1: alive Sister 1: alive 1 brother(s) , 1 sister(s) .  DM IN MOTHER SIDE, no early family history of coronary artery disease.       Social History:  General:  History of smoking  cigarettes: Current smoker Smoking: yes, 1/2-1 pk/day.  Alcohol: yes, occasionally.  Caffeine: yes.  no Diet.  Exercise: yes.  Occupation: unemployed, She is a Tree surgeon.  Marital Status: single, widowed, husband died at very young age.  Children: none.  Seat belt use: yes.        Medications: Lisinopril 40 MG Tablet 1 tablet Once a day, Synthroid 100 MCG 100 MCG Tablet 1 tablet every morning on an empty stomach Once a day, Albuterol Sulfate HFA 108 (90 Base) MCG/ACT Aerosol Solution 2 puffs as needed every 4 hrs, Zantac 75 mg Tablet 1 tablet Twice a day, Digoxin 0.25 mg 1 tablet once a day, Xarelto 20 mg 20 mg 1 tablet once a day, Spiriva HandiHaler 1 inhalation once a day, Dilt-CD 240 MG Capsule Extended Release 24 Hour 1 capsule twice a day, Metoprolol Tartrate 50 MG Tablet 1 tablet Twice a day, Medication List reviewed and reconciled with the patient       Allergies: N.K.D.A.      Objective:     Vitals: Wt 210.8, Wt change 3.8 lb, Ht 61", BMI 39.83, Pulse sitting 73, BP sitting 105/63.       Examination:  General Examination:  GENERAL APPEARANCE alert, oriented, NAD, pleasant.  SKIN: normal, no rash.  HEENT: normal.  HEAD: /AT.  EYES: EOMI, Conjunctiva clear.  NECK: supple, FROM, without evidence of thyromegaly, adenopathy, or bruits, no jugular venous distention (JVD).  LUNGS: clear to auscultation bilaterally, no wheezes, rhonchi, rales, regular breathing rate and effort.  HEART: regular rate and rhythm, no S3, S4, murmur or rub, point of maximul impulse (PMI) normal.  ABDOMEN: soft, non-tender/non-distended, bowel sounds present, no masses palpated, no bruit, obese.  EXTREMITIES: no clubbing, no edema, pulses 2 plus  bilaterally.  NEUROLOGIC EXAM: non-focal exam, alert and oriented x 3.  PERIPHERAL PULSES: normal (2+) bilaterally.  LYMPH NODES: no cervical adenopathy.  PSYCH affect normal.  Echocardiogram with normal EF, prior hospital records reviewed, free T4, TSH normal.       Assessment:     Assessment:  1. Atrial flutter - 427.32 (Primary)  2. Essential hypertension, benign - 401.1, Blood pressures control medication renewed blood work today  3. Tobacco dependence - 305.1  4. COPD (chronic obstructive pulmonary disease) - 496  5. Obesity - 278.00  6. Anticoagulant monitoring - V58.61    Plan:     1. Atrial flutter  Diagnostic Imaging:EKG AFlutter 73, Plummer,Wanda 09/28/2012 11:44:15 AM > SKAINS,MARK 09/28/2012 12:04:00 PM > no change from prior. , EKG Rhythm strip atrial flutter, variable conduction, Plummer,Wanda 09/28/2012 11:51:35 AM > SKAINS,MARK 09/28/2012 02:17:16 PM >  despite regular heart rate, she is in 4-1 atrial flutter. Symptomatically, she does feel increased fatigue/tiredness. I would like to attempt cardioversion. This will need to be done the week of January 20 to allow her to be on anticoagulation for one full month. I have also asked her on the day prior to cardioversion, I would like for her to hold her diltiazem and digoxin but continue metoprolol. Cardioversion may cause bradycardia. Risks and benefits of cardioversion have been discussed. Risk of stroke, heart attack, death, need for pacemaker are rare. She is willing to proceed. Just prior to cardioversion date, I would like for her to see Aram Beecham in the office, repeat EKG to once again verify that she is still in atrial flutter, obtain blood work.       2. Essential hypertension, benign  Currently well controlled. On multidrug regimen.       3. Tobacco dependence  Encouraged tobacco cessation. She states is usually smoked 2-3 cigarettes since her hospitalization.       4. COPD (chronic obstructive pulmonary disease)   Continue to encourage tobacco cessation. Occasional wheezing appreciated.       5. Obesity  Weight loss is important especially for control of atrial fibrillation. Encourage walking, fitness. Decrease carbohydrates. She does not think that she snores. She sleeps well.       6. Anticoagulant monitoring  Currently on Xarelto. Creatinine reviewed. No bleeding.        Immunizations:        Labs:        Procedure Codes: 96045 EKG I AND R, 93040 RHYTHM STRIP I AND R       Preventive:   CC: Dr. Donette Larry.       Follow Up: 4 Weeks      Provider: Donato Schultz, MD  Patient: Caitlin Hampton, Caitlin Hampton DOB: 1945-11-21 Date: 09/28/2012

## 2012-11-02 ENCOUNTER — Ambulatory Visit: Payer: Self-pay

## 2012-11-27 ENCOUNTER — Other Ambulatory Visit: Payer: Self-pay | Admitting: Cardiology

## 2012-11-27 NOTE — H&P (Signed)
proceed with cardioversion scheduled for 11/26/2012      HPI:     General:           Caitlin Hampton is a 67 year old female followed by Dr Anne Fu due to recently identified atrial flutter . On anticoagulation with Xarelto. She was hospitalized on 12/13 for atrial fibrillation with RVR. She originally went to ED due to swelling in face that was related to her parotid gland. She was found to be in atrial flutter with rapid ventricular response and started on Cardizem. She had mild pulmonary edema. She was admitted and had consultation by [facility] cardiology. Lopressor was added. Echocardiogram was done on December 16, which showed normal LV function, ejection fraction of 65%, moderate LVH. Mild mitral regurgitation, trivial pericardial effusion. Currently on diltiazem, metoprolol, and digoxin. She was given digoxin because of difficult to control heart rate. At discharge, her heart rate was 74 with atrial flutter. Overall she was asymptomatic with her atrial flutter. She has baseline dyspnea with hx of smoking/COPD and now on oxygen therapy. .         Patient denies chest pain, dizziness, syncope, nor PND. She denies any palpitations.Caitlin Hampton has chronic SOB due to her COPD and wears oxygen, 2 liters [place]. SOB with activity with ADLS and walking, resolves with rest. Chronic fatigue. Pt is now living with her sister. Her cardioversion was initially delayed due to missed Xarelto but after 4 weeks of taking it she is ready to proceed. She states she has not missed any Xarelto.     ROS:      as noted in HPI, no GI complaints,no nausea, vomiting no signs of rectal bleeding, no fever, chills + productive occasional cough. She states she only takes the [place] as needed not every day and never uses the ALbuterol....Marland Kitchenno neurologcial changes, noted chronic umbilical hernia ( instructed to show to PCP).     Medical History: Hypertension, GERD, Hypothyroid, Tobacco use, COPD; copd exac 2013, Rhinitis, Atrial flutter- RVR/  hospital 12/13- rate control and Echo normal- on xarelto, parotitis on CT of face- 12/13.      Surgical History: tonseleectomy .      Family History:  Father: deceased 11 yrs DEMENTIA, IN [place] Mother: deceased Brother 1: alive Sister 1: alive 1 brother(s) , 1 sister(s) .  DM IN MOTHER SIDE, no early family history of coronary artery disease.     Social History:      General: History of smoking  cigarettes:  Current smoker. Smoking: yes, 1/2-1 pk/day. Alcohol: yes, occasionally. Caffeine: yes. no Diet. Exercise: yes. Occupation: unemployed, She is a Tree surgeon. Marital Status: single, widowed, husband died at very young age. Children: none. Seat belt use: yes.      Medications: Lisinopril 40 MG Tablet 1 tablet Once a day, Metoprolol Tartrate 50 MG Tablet 1 tablet Twice a day, Synthroid 100 MCG 100 MCG Tablet 1 tablet every morning on an empty stomach Once a day, Albuterol Sulfate HFA 108 (90 Base) MCG/ACT Aerosol Solution 2 puffs as needed as directed, Zantac 75 mg Tablet 1 tablet Twice a day, Spiriva HandiHaler 1 inhalation once a day, Dilt-CD 240 MG Capsule Extended Release 24 Hour 1 capsule Twice a day, Digoxin 0.25 mg 1 tablet once a day, Xarelto 20 mg 20 mg 1 tablet once a day, Oxygen 2 L as directed , Medication List reviewed and reconciled with the patient     Allergies: N.K.D.A.     Objective:    Vitals: Wt 202.2, Wt change -  10 lb, Ht 61, BMI 38.20, Pulse sitting 84, BP sitting 172/90, Repeat BP 150/90.     Examination:     General Examination:         GENERAL APPEARANCE alert, oriented, NAD, .  SKIN: normal, no rash.  HEENT: normal.  NECK: supple, FROM, without evidence of bruits, no jugular venous distention (JVD).  LUNGS: clear to auscultation bilaterally, no wheezes, rhonchi, rales, regular breathing rate and effort, decrease bibasilar.  HEART: regular rate and rhythm, no S3, S4, murmur or rub, .  ABDOMEN: soft, non-tender/non-distended, bowel sounds present, .  EXTREMITIES: 1+  pretibial edema, of left leg only, chronically, no edema in right leg, pulses 2 plus bilaterally.  NEUROLOGIC EXAM: non-focal exam, alert and oriented x 3.  PERIPHERAL PULSES: normal (2+) bilaterally.            Assessment:    Assessment:  1. Atrial flutter - 427.32 (Primary)   2. Essential hypertension, benign - 401.1   3. COPD (chronic obstructive pulmonary disease) - 496     Plan:    1. Atrial flutter  Continue Dilt-CD Capsule Extended Release 24 Hour, 240 MG, 1 capsule, Orally, Twice a day ;  Continue Digoxin, 0.25 mg, 1 tablet, once a day ;  Continue Xarelto 20 mg, 20 mg, 1 tablet, once a day .         LAB: Basic Metabolic      GLUCOSE 101 70-99 - mg/dL H       BUN 12 9-14 - mg/dL        CREATININE 7.82 0.60-1.30 - mg/dl        eGFR (NON-AFRICAN AMERICAN) 83 >60 - calc        eGFR (AFRICAN AMERICAN) 101 >60 - calc        SODIUM 138 136-145 - mmol/L        POTASSIUM 4.0 3.5-5.5 - mmol/L        CHLORIDE 102 98-107 - mmol/L        C02 29 22-32 - mmol/L        ANION GAP 12.0 6.0-20.0 - mmol/L        CALCIUM 9.8 8.6-10.3 - mg/dL               FERGUSON,CYNTHIA A 11/19/2012 03:56:29 PM > stable for cardioverson McVey,Linda 11/19/2012 04:22:57 PM >        LAB: CBC with Diff      WBC 12.0 4.0-11.0 - K/ul H       RBC 4.34 4.20-5.40 - M/uL        HGB 13.4 12.0-16.0 - g/dL        HCT 95.6 21.3-08.6 - %        MCH 30.9 27.0-33.0 - pg        MPV 8.9 7.5-10.7 - fL        MCV 95.6 81.0-99.0 - fL        MCHC 32.4 32.0-36.0 - g/dL        RDW 57.8 46.9-62.9 - %        NRBC# 0.00 -        PLT 274 150-400 - K/uL         NEUT % 75.8 43.3-71.9 - % H       NRBC% 0.00 - %         LYMPH% 15.2 16.8-43.5 - % L       MONO % 7.8 4.6-12.4 - %        EOS %  0.6 0.0-7.8 - %        BASO % 0.6 0.0-1.0 - %         NEUT # 9.1 1.9-7.2 - K/uL H       LYMPH# 1.80 1.10-2.70 - K/uL         MONO # 0.9 0.3-0.8 - K/uL H       EOS # 0.1 0.0-0.6 - K/uL        BASO # 0.1 0.0-0.1 - K/uL                FERGUSON,CYNTHIA A 11/19/2012 01:16:56 PM > for cardioversion McVey,Linda 11/19/2012 04:22:45 PM >        LAB: PT and PTT ([id number])      aPTT 36 24-33 - SEC H        INR 1.5 0.8-1.2 - H        Prothrombin Time 16.2 9.1-12.0 - SEC H              FERGUSON,CYNTHIA A 11/20/2012 08:22:47 AM > for cardioversion on Xarelto   Diagnostic Imaging:EKG Atrial flutter/fibrillation, Boehler,Eileen 11/19/2012 10:44:48 AM >     Dr Anne Fu plans to proceed wth cardioverson and pt instructed to hold her diltiazem and digoxin the morning of procedure, . Risks and benefits of cardioversion have been discussed. Risk of stroke, heart attack, death, need for pacemaker are rare. pt agrees to proceed with cardioversion, scheduled for 11/26/12.       2. Essential hypertension, benign  Continue Lisinopril Tablet, 40 MG, 1 tablet, Orally, Once a day ;  Continue Metoprolol Tartrate Tablet, 50 MG, 1 tablet, Orally, Twice a day .       3. COPD (chronic obstructive pulmonary disease)  Continue Albuterol Sulfate HFA Aerosol Solution, 108 (90 Base) MCG/ACT, 2 puffs as needed, Inhalation, every 4 hrs ;  Continue Spiriva HandiHaler, 1 inhalation, once a day ;  Continue Oxygen, 2 L, as directed .    > 20 minutes spent reviewing COPD, encouraged exercises...and reviewed appropriate pulomonary rehab exercises, wearing oxygen and taking her Spiriva every day and proper use of Albuterol inhaler. 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 I explained that COPD is a chronic condition and not curable and encouraged taking medications and follow ups as scheduled. We also hope the cardioversion will help her dypsnea symptoms but that I feel her COPD is significantly causing this.          Procedures:      Venipuncture:          Venipuncture: Smith,Michele 11/19/2012 02:18:05 PM > , performed in right arm.          Immunizations:       Labs:      Procedure Codes: 11914 EKG I AND R, 80048  ECL BMP, 85025 ECL CBC PLATELET DIFF, 78295 BLOOD COLLECTION ROUTINE VENIPUNCTURE     Preventive:           Follow Up: MS following cardioverson (Reason: Atrial flutter)        Chantay Whitelock: Michaell Cowing. Emelda Fear, NP  Patient: Caitlin Hampton, Caitlin Hampton  DOB: 01-25-1946  Date: 11/19/2012

## 2012-12-16 ENCOUNTER — Ambulatory Visit (HOSPITAL_COMMUNITY)
Admission: RE | Admit: 2012-12-16 | Discharge: 2012-12-16 | Disposition: A | Discharge status: Home / Self Care | Payer: Medicare Other | Source: Ambulatory Visit | Attending: Cardiology | Admitting: Cardiology

## 2012-12-16 ENCOUNTER — Encounter (HOSPITAL_COMMUNITY): Payer: Self-pay | Admitting: *Deleted

## 2012-12-16 ENCOUNTER — Encounter (HOSPITAL_COMMUNITY)
Admission: RE | Disposition: A | Discharge status: Home / Self Care | Payer: Self-pay | Source: Ambulatory Visit | Attending: Cardiology

## 2012-12-16 DIAGNOSIS — J449 Chronic obstructive pulmonary disease, unspecified: Secondary | ICD-10-CM | POA: Insufficient documentation

## 2012-12-16 DIAGNOSIS — R609 Edema, unspecified: Secondary | ICD-10-CM | POA: Insufficient documentation

## 2012-12-16 DIAGNOSIS — Z538 Procedure and treatment not carried out for other reasons: Secondary | ICD-10-CM | POA: Insufficient documentation

## 2012-12-16 DIAGNOSIS — I4892 Unspecified atrial flutter: Secondary | ICD-10-CM | POA: Insufficient documentation

## 2012-12-16 DIAGNOSIS — Z9981 Dependence on supplemental oxygen: Secondary | ICD-10-CM | POA: Insufficient documentation

## 2012-12-16 DIAGNOSIS — J4489 Other specified chronic obstructive pulmonary disease: Secondary | ICD-10-CM | POA: Insufficient documentation

## 2012-12-16 DIAGNOSIS — Z7901 Long term (current) use of anticoagulants: Secondary | ICD-10-CM | POA: Insufficient documentation

## 2012-12-16 SURGERY — Surgical Case

## 2012-12-16 MED ORDER — RIVAROXABAN 20 MG PO TABS
20.0000 mg | ORAL_TABLET | Freq: Once | ORAL | Status: AC
  Administered 2012-12-16: 20 mg via ORAL
  Filled 2012-12-16: qty 1

## 2012-12-16 MED ORDER — ALBUTEROL SULFATE (5 MG/ML) 0.5% IN NEBU
2.5000 mg | INHALATION_SOLUTION | Freq: Once | RESPIRATORY_TRACT | Status: AC
  Administered 2012-12-16: 2.5 mg via RESPIRATORY_TRACT

## 2012-12-16 MED ORDER — ALBUTEROL SULFATE (5 MG/ML) 0.5% IN NEBU
INHALATION_SOLUTION | RESPIRATORY_TRACT | Status: AC
  Filled 2012-12-16: qty 0.5

## 2012-12-16 NOTE — Progress Notes (Signed)
67 year old with paroxysmal atrial flutter on anticoagulation. 67 L home oxygen COPD.  She has rescheduled several times for her cardioversion. Interestingly, on EKG this morning, she had auto converted into sinus rhythm.  She did however over the past week have some increased work of breathing, increased dyspnea on exertion that her sister who helps take care of her noted.  Since December his hospitalization, she has had increased lower extremity edema especially left greater than right.  I decided to administer nebulizer here. She felt better after this treatment.  SpO2 was originally in the low 80s, now improved. Blood pressure 150 systolic. General: Alert and oriented, laying comfortably. Mild increased work of breathing. Lungs: Poor air exchange. No use of accessory muscles. Minimal wheeze. Extremities: 2+ pitting edema left, 1+ right. Lower extremity.  Prior creatinine normal. Echocardiogram with normal EF.  Assessment and plan:  67 year old with PAF, home oxygen, COPD who has auto converted and does not require cardioversion.  1. Atrial flutter-continue with current medication including anticoagulation.  2. COPD-I would advocate for her to be on 3 L of home oxygen instead of 2. She has received nebulizer here in endoscopy. I have also strongly encourage that she see Dr. Donette Larry.   3. Lower extremity edema-certainly there is a possibility of concomitant diastolic dysfunction and because of this, I have added Lasix 40 mg by mouth twice a day with potassium 20 mEq once a day.  She has close followup with our office at 9:30 on Tuesday 12/22/12.  I have offered her admission to the hospital but at this point she declines. I spoken at length with her sister is well.

## 2012-12-16 NOTE — Progress Notes (Signed)
Call from Endoscopy nurse to administer neb treatment. Pt appearing SOB, SpO2 in low 80's. Crackles heard bilaterally. Pt feeling better after neb treatment, but SpO2 is back down to around 88%. RT will continue to monitor.

## 2013-04-07 ENCOUNTER — Telehealth (HOSPITAL_COMMUNITY): Payer: Self-pay | Admitting: Cardiology

## 2013-06-24 ENCOUNTER — Other Ambulatory Visit: Payer: Self-pay | Admitting: Gastroenterology

## 2013-06-25 ENCOUNTER — Other Ambulatory Visit: Payer: Self-pay | Admitting: Cardiology

## 2013-06-25 DIAGNOSIS — I4892 Unspecified atrial flutter: Secondary | ICD-10-CM

## 2013-08-09 ENCOUNTER — Other Ambulatory Visit: Payer: Self-pay | Admitting: Internal Medicine

## 2013-08-09 DIAGNOSIS — Z1231 Encounter for screening mammogram for malignant neoplasm of breast: Secondary | ICD-10-CM

## 2013-09-20 ENCOUNTER — Ambulatory Visit: Payer: Medicare Other

## 2013-09-20 ENCOUNTER — Encounter: Payer: Self-pay | Admitting: Cardiology

## 2013-09-20 DIAGNOSIS — E039 Hypothyroidism, unspecified: Secondary | ICD-10-CM | POA: Insufficient documentation

## 2013-09-20 DIAGNOSIS — J449 Chronic obstructive pulmonary disease, unspecified: Secondary | ICD-10-CM | POA: Insufficient documentation

## 2013-09-20 DIAGNOSIS — I10 Essential (primary) hypertension: Secondary | ICD-10-CM | POA: Insufficient documentation

## 2013-09-20 DIAGNOSIS — R0602 Shortness of breath: Secondary | ICD-10-CM | POA: Insufficient documentation

## 2013-09-20 DIAGNOSIS — I509 Heart failure, unspecified: Secondary | ICD-10-CM | POA: Insufficient documentation

## 2013-09-28 ENCOUNTER — Ambulatory Visit (HOSPITAL_COMMUNITY): Admit: 2013-09-28 | Payer: Self-pay | Admitting: Gastroenterology

## 2013-09-28 ENCOUNTER — Encounter (HOSPITAL_COMMUNITY): Payer: Self-pay

## 2013-09-28 SURGERY — COLONOSCOPY WITH PROPOFOL
Anesthesia: Monitor Anesthesia Care

## 2013-10-01 ENCOUNTER — Ambulatory Visit: Payer: Medicare Other | Admitting: Cardiology

## 2013-10-08 ENCOUNTER — Ambulatory Visit: Payer: Medicare Other | Admitting: Cardiology

## 2013-10-13 ENCOUNTER — Emergency Department (INDEPENDENT_AMBULATORY_CARE_PROVIDER_SITE_OTHER): Payer: Medicare Other

## 2013-10-13 ENCOUNTER — Encounter (HOSPITAL_COMMUNITY): Payer: Self-pay | Admitting: Emergency Medicine

## 2013-10-13 ENCOUNTER — Emergency Department (HOSPITAL_COMMUNITY)
Admission: EM | Admit: 2013-10-13 | Discharge: 2013-10-13 | Disposition: A | Discharge status: Home / Self Care | Payer: Medicare Other | Source: Home / Self Care | Attending: Family Medicine | Admitting: Family Medicine

## 2013-10-13 DIAGNOSIS — S60222A Contusion of left hand, initial encounter: Secondary | ICD-10-CM

## 2013-10-13 DIAGNOSIS — S60229A Contusion of unspecified hand, initial encounter: Secondary | ICD-10-CM

## 2013-10-13 NOTE — ED Notes (Signed)
Pt reports she hit her left hand on a wooden door frame on Saturday Noticed bruising that is going onto fingers... Denies pain Taking Xarelto  Alert w/no signs of acute distress.

## 2013-10-13 NOTE — Discharge Instructions (Signed)
Thank you for coming in today. Continue xeralto. Keep your hand elevated. Take Tylenol for pain control as needed. Followup with your primary care provider  Hand Contusion  A hand contusion is a deep bruise to the hand. Contusions happen when an injury causes bleeding under the skin. Signs of bruising include pain, puffiness (swelling), and discolored skin. The contusion may turn blue, purple, or yellow. HOME CARE  Put ice on the injured area.  Put ice in a plastic bag.  Place a towel between your skin and the bag.  Leave the ice on for 15-20 minutes, 03-04 times a day.  Only take medicines as told by your doctor.  Use an elastic wrap only as told. You may remove the wrap for sleeping, showering, and bathing. Take the wrap off if you lose feeling (have numbness) in your fingers, or they turn blue or cold. Put the wrap on more loosely.  Keep the hand raised (elevated) with pillows.  Avoid using your hand too much if it painful. GET HELP RIGHT AWAY IF:   You have more redness, puffiness, or pain in your hand.  Your puffiness or pain does not get better with medicine.  You lose feeling in your hand, or you cannot move your fingers.  Your hand turns cold or blue.  You have pain when you move your fingers.  Your hand feels warm.  Your contusion does not get better in 2 days. MAKE SURE YOU:   Understand these instructions.  Will watch this condition.  Will get help right away if you are not doing well or you get worse. Document Released: 02/26/2008 Document Revised: 06/03/2012 Document Reviewed: 03/02/2012 [facility] Patient Information 2014 [place].

## 2013-10-13 NOTE — ED Provider Notes (Signed)
Caitlin Hampton is a 68 y.o. female who presents to Urgent Care today for left hand contusion. Patient is currently anticoagulated with Xarelto due to atrial fibrillation. She hit her hand on a wooden door frame for 5 days ago. She has moderate pain at the second metacarpal and significant bruising. She is here today more to evaluate her bruising. She is present extended across the breath of her hands and recently extending more distally to her proximal fingers. She denies any significant radiating pain weakness or numbness.   Past Medical History  Diagnosis Date  . Hypertension   . Thyroid disease   . COPD (chronic obstructive pulmonary disease)   . Anxiety   . H/O hiatal hernia   . Hypothyroidism   . Shortness of breath   . Atrial flutter 09/07/2012    , RVR/ hospital 12/13- rate control and Echo normal- on xarelto, when spontaneously converted she had bradycardia on Metoprolol and Digoxin  . GERD (gastroesophageal reflux disease)   . Rhinitis   . Parotitis     , on CT of face- 12/13  . CHF (congestive heart failure)    History  Substance Use Topics  . Smoking status: Former Smoker -- 1.50 packs/day    Types: Cigarettes    Quit date: 07/23/2012  . Smokeless tobacco: Not on file  . Alcohol Use: Yes     Comment: occassionally   ROS as above Medications: No current facility-administered medications for this encounter.   Current Outpatient Prescriptions  Medication Sig Dispense Refill  . diltiazem (CARDIZEM CD) 240 MG 24 hr capsule Take 120 mg by mouth 2 (two) times daily.      Marland Kitchen levothyroxine (SYNTHROID, LEVOTHROID) 100 MCG tablet Take 100 mcg by mouth daily.      Marland Kitchen lisinopril (PRINIVIL,ZESTRIL) 40 MG tablet Take 40 mg by mouth daily.      . ranitidine (ZANTAC) 75 MG tablet Take 75 mg by mouth 2 (two) times daily.      . Rivaroxaban (XARELTO) 20 MG TABS Take 1 tablet (20 mg total) by mouth daily with breakfast.  30 tablet  11  . albuterol (PROVENTIL HFA;VENTOLIN HFA) 108 (90  BASE) MCG/ACT inhaler Inhale 2 puffs into the lungs every 6 (six) hours as needed for wheezing.  1 Inhaler  5  . furosemide (LASIX) 40 MG tablet Take 40 mg by mouth daily.      . potassium chloride (KLOR-CON) 20 MEQ packet Take 10 mEq by mouth daily.      Marland Kitchen tiotropium (SPIRIVA) 18 MCG inhalation capsule Place 1 capsule (18 mcg total) into inhaler and inhale daily.  30 capsule  11    Exam:  BP 168/74  Pulse 91  Temp(Src) 98.3 F (36.8 C) (Oral)  Resp 23  SpO2 94% Gen: Well NAD LEFT HAND: Significant ecchymosis on the dorsal aspect of her left hand. Mildly tender to palpation proximal aspect of the second metacarpal.  Normal hand motion capillary refill and sensation.  No results found for this or any previous visit (from the past 24 hour(s)). Dg Hand Complete Left  10/13/2013   CLINICAL DATA:  Left hand injury and pain.  EXAM: LEFT HAND - COMPLETE 3+ VIEW  COMPARISON:  None.  FINDINGS: No acute bony or joint abnormality is identified. There appears to be some soft tissue swelling about the hand. First CMC degenerative change is noted.  IMPRESSION: Negative for acute bony or joint abnormality.   Electronically Signed   By: Inge Rise M.D.  On: 10/13/2013 15:16    Assessment and Plan: 68 y.o. female with left hand contusion. Significant ecchymosis due to anticoagulant state.  No fracture.  Followup with primary care provider.  Decreased oxygen saturation normal with her state of COPD  Discussed warning signs or symptoms. Please see discharge instructions. Patient expresses understanding.    Gregor Hams, MD 10/13/13 1535

## 2013-10-15 ENCOUNTER — Ambulatory Visit: Payer: Medicare Other | Admitting: Cardiology

## 2013-10-22 ENCOUNTER — Ambulatory Visit (INDEPENDENT_AMBULATORY_CARE_PROVIDER_SITE_OTHER): Payer: Medicare Other | Admitting: Cardiology

## 2013-10-22 ENCOUNTER — Encounter: Payer: Self-pay | Admitting: Cardiology

## 2013-10-22 VITALS — BP 120/78 | HR 80 | Ht 64.0 in | Wt 229.0 lb

## 2013-10-22 DIAGNOSIS — I4892 Unspecified atrial flutter: Secondary | ICD-10-CM

## 2013-10-22 DIAGNOSIS — E669 Obesity, unspecified: Secondary | ICD-10-CM

## 2013-10-22 DIAGNOSIS — J4489 Other specified chronic obstructive pulmonary disease: Secondary | ICD-10-CM

## 2013-10-22 DIAGNOSIS — J449 Chronic obstructive pulmonary disease, unspecified: Secondary | ICD-10-CM

## 2013-10-22 DIAGNOSIS — I5032 Chronic diastolic (congestive) heart failure: Secondary | ICD-10-CM

## 2013-10-22 DIAGNOSIS — R0902 Hypoxemia: Secondary | ICD-10-CM

## 2013-10-22 NOTE — Patient Instructions (Signed)
Your physician wants you to follow-up in: 6 months with Dr. Skains You will receive a reminder letter in the mail two months in advance. If you don't receive a letter, please call our office to schedule the follow-up appointment.  Your physician recommends that you continue on your current medications as directed. Please refer to the Current Medication list given to you today.  

## 2013-10-22 NOTE — Progress Notes (Signed)
[place]. [address] [place], [place]  [address] Phone: [phone] Fax:  [phone]  Date:  10/22/2013   ID:  Caitlin Hampton, Caitlin Hampton 07/19/1946, MRN [id number]  PCP:  [name], MD   History of Present Illness: Caitlin Hampton is a 68 y.o. female COPD on home O2, atrial flutter currently on anticoagulation with Xarelto. Currently in NSR. She was hospitalized on 12/13 for rapid ventricular response atrial fibrillation.  Her original complaint was swelling of her neck and face. She was found to be in atrial flutter with rapid ventricular response and started on Cardizem, relatively asymptomatic. She was admitted and had consultation by [facility] cardiology. Lopressor was added.  Echocardiogram was done on September 07 2012 which showed normal ejection fraction of 65%, moderate LVH. Mild mitral regurgitation, trivial pericardial effusion.   At discharge, her heart rate was 74 with atrial flutter. Overall she was asymptomatic with her atrial flutter. She has baseline dyspnea which he attributes to her smoking/COPD.  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 Shortness of breath with activity. She has had some weight gain but she admits to dietary indiscretion.   Wt Readings from Last 3 Encounters:  10/22/13 229 lb (103.874 kg)  09/07/12 212 lb 15.4 oz (96.6 kg)     Past Medical History  Diagnosis Date  . Hypertension   . Thyroid disease   . COPD (chronic obstructive pulmonary disease)   . Anxiety   . H/O hiatal hernia   . Hypothyroidism   . Shortness of breath   . Atrial flutter 09/07/2012    , RVR/ hospital 12/13- rate control and Echo normal- on xarelto, when spontaneously converted she had bradycardia on Metoprolol and Digoxin  . GERD (gastroesophageal reflux disease)   . Rhinitis   . Parotitis     , on CT of face- 12/13  . CHF (congestive heart failure)     Past Surgical History  Procedure Laterality Date  . Breast surgery     benign R brest CA  . Tonsillectomy      Current Outpatient Prescriptions  Medication Sig Dispense Refill  . albuterol (PROVENTIL HFA;VENTOLIN HFA) 108 (90 BASE) MCG/ACT inhaler Inhale 2 puffs into the lungs every 6 (six) hours as needed for wheezing.  1 Inhaler  5  . diltiazem (CARDIZEM CD) 240 MG 24 hr capsule Take 120 mg by mouth 2 (two) times daily.      . furosemide (LASIX) 40 MG tablet Take 40 mg by mouth daily.      Marland Kitchen levothyroxine (SYNTHROID, LEVOTHROID) 100 MCG tablet Take 100 mcg by mouth daily.      Marland Kitchen lisinopril (PRINIVIL,ZESTRIL) 40 MG tablet Take 40 mg by mouth daily.      . potassium chloride (KLOR-CON) 20 MEQ packet Take 10 mEq by mouth daily.      . Rivaroxaban (XARELTO) 20 MG TABS Take 1 tablet (20 mg total) by mouth daily with breakfast.  30 tablet  11  . tiotropium (SPIRIVA) 18 MCG inhalation capsule Place 1 capsule (18 mcg total) into inhaler and inhale daily.  30 capsule  11   No current facility-administered medications for this visit.    Allergies:   No Known Allergies  Social History:  The patient  reports that she quit smoking about 15 months ago. Her smoking use included Cigarettes. She smoked 1.50 packs per day. She does not have any smokeless tobacco history on file. She reports that she drinks  alcohol. She reports that she does not use illicit drugs.   ROS:  Please see the history of present illness.   Denies any breathing issues, syncope, orthopnea, PND    PHYSICAL EXAM: VS:  BP 120/78  Pulse 80  Ht 5\' 4"  (1.626 m)  Wt 229 lb (103.874 kg)  BMI 39.29 kg/m2  SpO2 98% Well nourished, well developed, in no acute distressWheelchair HEENT: normalHome oxygen. Neck: no JVD Cardiac:  normal S1, S2; RRR; no murmur Lungs:  clear to auscultation bilaterally, no wheezing, rhonchi or rales Abd: soft, nontender, no hepatomegaly obesity Ext: no edema, bruising on hand. Skin: warm and dry Neuro: no focal abnormalities noted  EKG:  None today     ASSESSMENT AND  PLAN:  1. Atrial flutter-paroxysmal, currently sinus rhythm, continue with anticoagulation given her risk score of at least 2. 2. Obesity-continue to encourage weight loss. 3. COPD/hypoxia-home oxygen, likely driving a lot of her dyspnea as well. 4. Diastolic heart failure-currently compensated. Ankles, hands no edema.  Signed, [name], MD [facility]  10/22/2013 2:14 PM

## 2013-11-10 ENCOUNTER — Telehealth: Payer: Self-pay | Admitting: Cardiology

## 2013-11-10 NOTE — Telephone Encounter (Signed)
New message    Lelon Frohlich sister is calling to see if patient could be by herself for a couple of days.

## 2013-11-10 NOTE — Telephone Encounter (Signed)
To [place].

## 2013-12-03 NOTE — Telephone Encounter (Signed)
I called Edithe and made sure it was ok for [place] to talk to her sister and she said it was ok. SIster was not home to talk to. Will try again later.

## 2013-12-06 ENCOUNTER — Other Ambulatory Visit: Payer: Medicare Other

## 2014-03-31 ENCOUNTER — Emergency Department (HOSPITAL_COMMUNITY): Payer: Medicare Other

## 2014-03-31 ENCOUNTER — Encounter (HOSPITAL_COMMUNITY): Payer: Self-pay | Admitting: Emergency Medicine

## 2014-03-31 ENCOUNTER — Inpatient Hospital Stay (HOSPITAL_COMMUNITY)
Admission: EM | Admit: 2014-03-31 | Discharge: 2014-04-04 | DRG: 308 | Disposition: A | Discharge status: Home / Self Care | Payer: Medicare Other | Attending: Cardiovascular Disease | Admitting: Cardiovascular Disease

## 2014-03-31 DIAGNOSIS — W2209XA Striking against other stationary object, initial encounter: Secondary | ICD-10-CM | POA: Diagnosis present

## 2014-03-31 DIAGNOSIS — Z8249 Family history of ischemic heart disease and other diseases of the circulatory system: Secondary | ICD-10-CM

## 2014-03-31 DIAGNOSIS — R339 Retention of urine, unspecified: Secondary | ICD-10-CM | POA: Diagnosis present

## 2014-03-31 DIAGNOSIS — E039 Hypothyroidism, unspecified: Secondary | ICD-10-CM | POA: Diagnosis present

## 2014-03-31 DIAGNOSIS — J4489 Other specified chronic obstructive pulmonary disease: Secondary | ICD-10-CM | POA: Diagnosis present

## 2014-03-31 DIAGNOSIS — I5031 Acute diastolic (congestive) heart failure: Secondary | ICD-10-CM

## 2014-03-31 DIAGNOSIS — K449 Diaphragmatic hernia without obstruction or gangrene: Secondary | ICD-10-CM | POA: Diagnosis present

## 2014-03-31 DIAGNOSIS — J441 Chronic obstructive pulmonary disease with (acute) exacerbation: Secondary | ICD-10-CM

## 2014-03-31 DIAGNOSIS — Z87891 Personal history of nicotine dependence: Secondary | ICD-10-CM

## 2014-03-31 DIAGNOSIS — Z7901 Long term (current) use of anticoagulants: Secondary | ICD-10-CM

## 2014-03-31 DIAGNOSIS — Z9981 Dependence on supplemental oxygen: Secondary | ICD-10-CM

## 2014-03-31 DIAGNOSIS — Z823 Family history of stroke: Secondary | ICD-10-CM

## 2014-03-31 DIAGNOSIS — IMO0002 Reserved for concepts with insufficient information to code with codable children: Secondary | ICD-10-CM | POA: Diagnosis present

## 2014-03-31 DIAGNOSIS — I4892 Unspecified atrial flutter: Principal | ICD-10-CM | POA: Diagnosis present

## 2014-03-31 DIAGNOSIS — E279 Disorder of adrenal gland, unspecified: Secondary | ICD-10-CM | POA: Diagnosis present

## 2014-03-31 DIAGNOSIS — Z720 Tobacco use: Secondary | ICD-10-CM | POA: Diagnosis present

## 2014-03-31 DIAGNOSIS — R000 Tachycardia, unspecified: Secondary | ICD-10-CM | POA: Diagnosis present

## 2014-03-31 DIAGNOSIS — J449 Chronic obstructive pulmonary disease, unspecified: Secondary | ICD-10-CM | POA: Diagnosis present

## 2014-03-31 DIAGNOSIS — M7989 Other specified soft tissue disorders: Secondary | ICD-10-CM

## 2014-03-31 DIAGNOSIS — R0600 Dyspnea, unspecified: Secondary | ICD-10-CM

## 2014-03-31 DIAGNOSIS — I509 Heart failure, unspecified: Secondary | ICD-10-CM

## 2014-03-31 DIAGNOSIS — I483 Typical atrial flutter: Secondary | ICD-10-CM

## 2014-03-31 DIAGNOSIS — F411 Generalized anxiety disorder: Secondary | ICD-10-CM | POA: Diagnosis present

## 2014-03-31 DIAGNOSIS — I4891 Unspecified atrial fibrillation: Secondary | ICD-10-CM | POA: Diagnosis present

## 2014-03-31 DIAGNOSIS — I10 Essential (primary) hypertension: Secondary | ICD-10-CM

## 2014-03-31 DIAGNOSIS — D3500 Benign neoplasm of unspecified adrenal gland: Secondary | ICD-10-CM | POA: Diagnosis present

## 2014-03-31 DIAGNOSIS — K219 Gastro-esophageal reflux disease without esophagitis: Secondary | ICD-10-CM | POA: Diagnosis present

## 2014-03-31 DIAGNOSIS — N179 Acute kidney failure, unspecified: Secondary | ICD-10-CM | POA: Diagnosis not present

## 2014-03-31 DIAGNOSIS — I5043 Acute on chronic combined systolic (congestive) and diastolic (congestive) heart failure: Secondary | ICD-10-CM | POA: Diagnosis present

## 2014-03-31 DIAGNOSIS — R350 Frequency of micturition: Secondary | ICD-10-CM | POA: Diagnosis present

## 2014-03-31 LAB — COMPREHENSIVE METABOLIC PANEL
ALT: 14 U/L (ref 0–35)
ANION GAP: 11 (ref 5–15)
AST: 18 U/L (ref 0–37)
Albumin: 4.1 g/dL (ref 3.5–5.2)
Alkaline Phosphatase: 83 U/L (ref 39–117)
BUN: 11 mg/dL (ref 6–23)
CALCIUM: 9.8 mg/dL (ref 8.4–10.5)
CO2: 31 mEq/L (ref 19–32)
Chloride: 100 mEq/L (ref 96–112)
Creatinine, Ser: 0.64 mg/dL (ref 0.50–1.10)
GFR calc non Af Amer: 90 mL/min — ABNORMAL LOW (ref >90)
Glucose, Bld: 101 mg/dL — ABNORMAL HIGH (ref 70–99)
Potassium: 4.4 mEq/L (ref 3.7–5.3)
SODIUM: 142 meq/L (ref 137–147)
Total Bilirubin: 0.4 mg/dL (ref 0.3–1.2)
Total Protein: 7.5 g/dL (ref 6.0–8.3)

## 2014-03-31 LAB — CBC WITH DIFFERENTIAL/PLATELET
Basophils Absolute: 0 10*3/uL (ref 0.0–0.1)
Basophils Relative: 0 % (ref 0–1)
Eosinophils Absolute: 0.1 10*3/uL (ref 0.0–0.7)
Eosinophils Relative: 1 % (ref 0–5)
HCT: 38.5 % (ref 36.0–46.0)
HEMOGLOBIN: 12 g/dL (ref 12.0–15.0)
LYMPHS ABS: 1.4 10*3/uL (ref 0.7–4.0)
LYMPHS PCT: 15 % (ref 12–46)
MCH: 29.9 pg (ref 26.0–34.0)
MCHC: 31.2 g/dL (ref 30.0–36.0)
MCV: 96 fL (ref 78.0–100.0)
Monocytes Absolute: 0.6 10*3/uL (ref 0.1–1.0)
Monocytes Relative: 7 % (ref 3–12)
NEUTROS ABS: 7.4 10*3/uL (ref 1.7–7.7)
NEUTROS PCT: 77 % (ref 43–77)
Platelets: 225 10*3/uL (ref 150–400)
RBC: 4.01 MIL/uL (ref 3.87–5.11)
RDW: 13.9 % (ref 11.5–15.5)
WBC: 9.5 10*3/uL (ref 4.0–10.5)

## 2014-03-31 LAB — PROTIME-INR
INR: 2.3 — ABNORMAL HIGH (ref 0.00–1.49)
Prothrombin Time: 25.3 seconds — ABNORMAL HIGH (ref 11.6–15.2)

## 2014-03-31 LAB — TROPONIN I: Troponin I: <0.3 ng/mL (ref <0.30)

## 2014-03-31 LAB — MRSA PCR SCREENING: MRSA BY PCR: NEGATIVE

## 2014-03-31 LAB — APTT: aPTT: 51 seconds — ABNORMAL HIGH (ref 24–37)

## 2014-03-31 LAB — D-DIMER, QUANTITATIVE: D-Dimer, Quant: 0.86 ug/mL-FEU — ABNORMAL HIGH (ref 0.00–0.48)

## 2014-03-31 LAB — PRO B NATRIURETIC PEPTIDE: Pro B Natriuretic peptide (BNP): 918.7 pg/mL — ABNORMAL HIGH (ref 0–125)

## 2014-03-31 MED ORDER — FUROSEMIDE 40 MG PO TABS
40.0000 mg | ORAL_TABLET | Freq: Every day | ORAL | Status: DC
  Administered 2014-04-01 – 2014-04-02 (×2): 40 mg via ORAL
  Filled 2014-03-31 (×3): qty 1

## 2014-03-31 MED ORDER — AMIODARONE HCL IN DEXTROSE 360-4.14 MG/200ML-% IV SOLN
60.0000 mg/h | INTRAVENOUS | Status: AC
  Administered 2014-03-31 – 2014-04-01 (×2): 60 mg/h via INTRAVENOUS
  Filled 2014-03-31: qty 200

## 2014-03-31 MED ORDER — IOHEXOL 350 MG/ML SOLN
100.0000 mL | Freq: Once | INTRAVENOUS | Status: AC | PRN
  Administered 2014-03-31: 100 mL via INTRAVENOUS

## 2014-03-31 MED ORDER — FUROSEMIDE 10 MG/ML IJ SOLN
40.0000 mg | Freq: Once | INTRAMUSCULAR | Status: AC
  Administered 2014-03-31: 40 mg via INTRAVENOUS

## 2014-03-31 MED ORDER — RIVAROXABAN 20 MG PO TABS
20.0000 mg | ORAL_TABLET | Freq: Every day | ORAL | Status: DC
  Administered 2014-04-01 – 2014-04-04 (×4): 20 mg via ORAL
  Filled 2014-03-31 (×5): qty 1

## 2014-03-31 MED ORDER — POTASSIUM CHLORIDE CRYS ER 10 MEQ PO TBCR
10.0000 meq | EXTENDED_RELEASE_TABLET | Freq: Every day | ORAL | Status: DC
  Administered 2014-03-31 – 2014-04-04 (×5): 10 meq via ORAL
  Filled 2014-03-31 (×5): qty 1

## 2014-03-31 MED ORDER — DILTIAZEM HCL ER 120 MG PO CP24
120.0000 mg | ORAL_CAPSULE | Freq: Once | ORAL | Status: AC
  Administered 2014-04-01: 120 mg via ORAL
  Filled 2014-03-31: qty 1

## 2014-03-31 MED ORDER — LISINOPRIL 40 MG PO TABS: 40.0000 mg | ORAL_TABLET | Freq: Every day | ORAL | Status: DC

## 2014-03-31 MED ORDER — DILTIAZEM HCL 100 MG IV SOLR
5.0000 mg/h | Freq: Once | INTRAVENOUS | Status: AC
  Administered 2014-03-31: 5 mg/h via INTRAVENOUS

## 2014-03-31 MED ORDER — LISINOPRIL 40 MG PO TABS
40.0000 mg | ORAL_TABLET | Freq: Every day | ORAL | Status: DC
  Administered 2014-04-01 – 2014-04-03 (×3): 40 mg via ORAL
  Filled 2014-03-31 (×3): qty 1

## 2014-03-31 MED ORDER — LEVOTHYROXINE SODIUM 100 MCG PO TABS
100.0000 ug | ORAL_TABLET | Freq: Every day | ORAL | Status: DC
  Administered 2014-04-01 – 2014-04-04 (×4): 100 ug via ORAL
  Filled 2014-03-31 (×5): qty 1

## 2014-03-31 MED ORDER — DILTIAZEM HCL 25 MG/5ML IV SOLN
10.0000 mg | Freq: Once | INTRAVENOUS | Status: AC
  Administered 2014-03-31: 10 mg via INTRAVENOUS
  Filled 2014-03-31: qty 5

## 2014-03-31 MED ORDER — FUROSEMIDE 10 MG/ML IJ SOLN: 40.0000 mg | Freq: Once | INTRAMUSCULAR | Status: DC

## 2014-03-31 MED ORDER — AMIODARONE LOAD VIA INFUSION
150.0000 mg | Freq: Once | INTRAVENOUS | Status: AC
  Administered 2014-03-31: 150 mg via INTRAVENOUS
  Filled 2014-03-31: qty 83.34

## 2014-03-31 MED ORDER — ACETAMINOPHEN 500 MG PO TABS: 1000.0000 mg | ORAL_TABLET | Freq: Four times a day (QID) | ORAL | Status: DC | PRN

## 2014-03-31 MED ORDER — FUROSEMIDE 10 MG/ML IJ SOLN
80.0000 mg | Freq: Once | INTRAMUSCULAR | Status: DC
  Filled 2014-03-31: qty 8

## 2014-03-31 MED ORDER — METOPROLOL TARTRATE 1 MG/ML IV SOLN
5.0000 mg | Freq: Once | INTRAVENOUS | Status: AC
  Administered 2014-04-01: 5 mg via INTRAVENOUS
  Filled 2014-03-31: qty 5

## 2014-03-31 MED ORDER — AMIODARONE HCL IN DEXTROSE 360-4.14 MG/200ML-% IV SOLN
30.0000 mg/h | INTRAVENOUS | Status: DC
  Administered 2014-04-01 – 2014-04-03 (×3): 30 mg/h via INTRAVENOUS
  Filled 2014-03-31 (×10): qty 200

## 2014-03-31 NOTE — Progress Notes (Signed)
Unit CM UR Completed by [facility] ED CM  W. Miraj Truss RN  

## 2014-03-31 NOTE — Progress Notes (Signed)
*  PRELIMINARY RESULTS* Vascular Ultrasound Lower extremity venous duplex has been completed.  Preliminary findings: Patient could not tolerate compressions on thighs, therefore femoral veins only evaluated with Color Doppler. No obvious evidence of DVT noted.   Landry Mellow, RDMS, RVT  03/31/2014, 5:16 PM

## 2014-03-31 NOTE — ED Notes (Signed)
Cardiology at bedside.

## 2014-03-31 NOTE — H&P (Signed)
Chief Complaint:  Tachycardia, dyspnea  HPI:  This is a 68 y.o. female with a past medical history significant for COPD on home O2, atrial flutter currently on anticoagulation with Xarelto. She has had gradually worsening dyspnea for two months. She has a chronic cough, recently productive of mostly clear sputum and has occasional wheezing. She denies fever, chills or chest pain (either anginal or pleuritic). In her PCP's office today she was found to have a persistent regular tachycardia at 145-150 bpm  She was hospitalized on 12/13 for rapid ventricular response atrial fibrillation. Echocardiogram was done on September 07 2012 which showed normal ejection fraction of 65%, moderate LVH. Mild mitral regurgitation, trivial pericardial effusion.   She has diastolic heart failure and has taken furosemide very infrequently due to urinary frequency/incomplete bladder emptying.    PMHx:  Past Medical History  Diagnosis Date  . Hypertension   . Thyroid disease   . COPD (chronic obstructive pulmonary disease)   . Anxiety   . H/O hiatal hernia   . Hypothyroidism   . Shortness of breath   . Atrial flutter 09/07/2012    , RVR/ hospital 12/13- rate control and Echo normal- on xarelto, when spontaneously converted she had bradycardia on Metoprolol and Digoxin  . GERD (gastroesophageal reflux disease)   . Rhinitis   . Parotitis     , on CT of face- 12/13  . CHF (congestive heart failure)     Past Surgical History  Procedure Laterality Date  . Breast surgery      benign R brest CA  . Tonsillectomy      FAMHx:  Family History  Problem Relation Age of Onset  . CAD Mother   . Stroke Mother   . Prostate cancer Father   . Dementia Father     SOCHx:   reports that she quit smoking about 20 months ago. Her smoking use included Cigarettes. She smoked 1.50 packs per day. She does not have any smokeless tobacco history on file. She reports that she drinks alcohol. She reports that she does  not use illicit drugs.  ALLERGIES:  No Known Allergies  ROS: The patient specifically denies any chest pain at rest or with exertion, orthopnea, paroxysmal nocturnal dyspnea, syncope, palpitations, focal neurological deficits, intermittent claudication, lower extremity edema, unexplained weight gain, cough, hemoptysis or wheezing.  The patient also denies abdominal pain, nausea, vomiting, dysphagia, diarrhea, constipation, polyuria, polydipsia, dysuria, hematuria, frequency, urgency, abnormal bleeding or bruising, fever, chills, unexpected weight changes, mood swings, change in skin or hair texture, change in voice quality, auditory or visual problems, allergic reactions or rashes, new musculoskeletal complaints other than usual "aches and pains".  HOME MEDS:  (Not in a hospital admission)  LABS/IMAGING: Results for orders placed during the hospital encounter of 03/31/14 (from the past 48 hour(s))  TROPONIN I     Status: None   Collection Time    03/31/14  3:55 PM      Result Value Ref Range   Troponin I <0.30  <0.30 ng/mL   Comment:            Due to the release kinetics of cTnI,     a negative result within the first hours     of the onset of symptoms does not rule out     myocardial infarction with certainty.     If myocardial infarction is still suspected,     repeat the test at appropriate intervals.  APTT     Status:  Abnormal   Collection Time    03/31/14  3:55 PM      Result Value Ref Range   aPTT 51 (*) 24 - 37 seconds   Comment:            IF BASELINE aPTT IS ELEVATED,     SUGGEST PATIENT RISK ASSESSMENT     BE USED TO DETERMINE APPROPRIATE     ANTICOAGULANT THERAPY.  PROTIME-INR     Status: Abnormal   Collection Time    03/31/14  3:55 PM      Result Value Ref Range   Prothrombin Time 25.3 (*) 11.6 - 15.2 seconds   INR 2.30 (*) 0.00 - 1.49  PRO B NATRIURETIC PEPTIDE     Status: Abnormal   Collection Time    03/31/14  3:55 PM      Result Value Ref Range   Pro B  Natriuretic peptide (BNP) 918.7 (*) 0 - 125 pg/mL  D-DIMER, QUANTITATIVE     Status: Abnormal   Collection Time    03/31/14  3:55 PM      Result Value Ref Range   D-Dimer, Quant 0.86 (*) 0.00 - 0.48 ug/mL-FEU   Comment:            AT THE INHOUSE ESTABLISHED CUTOFF     VALUE OF 0.48 ug/mL FEU,     THIS ASSAY HAS BEEN DOCUMENTED     IN THE LITERATURE TO HAVE     A SENSITIVITY AND NEGATIVE     PREDICTIVE VALUE OF AT LEAST     98 TO 99%.  THE TEST RESULT     SHOULD BE CORRELATED WITH     AN ASSESSMENT OF THE CLINICAL     PROBABILITY OF DVT / VTE.  COMPREHENSIVE METABOLIC PANEL     Status: Abnormal   Collection Time    03/31/14  3:55 PM      Result Value Ref Range   Sodium 142  137 - 147 mEq/L   Potassium 4.4  3.7 - 5.3 mEq/L   Chloride 100  96 - 112 mEq/L   CO2 31  19 - 32 mEq/L   Glucose, Bld 101 (*) 70 - 99 mg/dL   BUN 11  6 - 23 mg/dL   Creatinine, Ser 0.64  0.50 - 1.10 mg/dL   Calcium 9.8  8.4 - 10.5 mg/dL   Total Protein 7.5  6.0 - 8.3 g/dL   Albumin 4.1  3.5 - 5.2 g/dL   AST 18  0 - 37 U/L   ALT 14  0 - 35 U/L   Alkaline Phosphatase 83  39 - 117 U/L   Total Bilirubin 0.4  0.3 - 1.2 mg/dL   GFR calc non Af Amer 90 (*) >90 mL/min   GFR calc Af Amer >90  >90 mL/min   Comment: (NOTE)     The eGFR has been calculated using the CKD EPI equation.     This calculation has not been validated in all clinical situations.     eGFR's persistently <90 mL/min signify possible Chronic Kidney     Disease.   Anion gap 11  5 - 15  CBC WITH DIFFERENTIAL     Status: None   Collection Time    03/31/14  3:55 PM      Result Value Ref Range   WBC 9.5  4.0 - 10.5 K/uL   RBC 4.01  3.87 - 5.11 MIL/uL   Hemoglobin 12.0  12.0 - 15.0 g/dL  HCT 38.5  36.0 - 46.0 %   MCV 96.0  78.0 - 100.0 fL   MCH 29.9  26.0 - 34.0 pg   MCHC 31.2  30.0 - 36.0 g/dL   RDW 13.9  11.5 - 15.5 %   Platelets 225  150 - 400 K/uL   Neutrophils Relative % 77  43 - 77 %   Neutro Abs 7.4  1.7 - 7.7 K/uL    Lymphocytes Relative 15  12 - 46 %   Lymphs Abs 1.4  0.7 - 4.0 K/uL   Monocytes Relative 7  3 - 12 %   Monocytes Absolute 0.6  0.1 - 1.0 K/uL   Eosinophils Relative 1  0 - 5 %   Eosinophils Absolute 0.1  0.0 - 0.7 K/uL   Basophils Relative 0  0 - 1 %   Basophils Absolute 0.0  0.0 - 0.1 K/uL   Ct Angio Chest W/cm &/or Wo Cm  03/31/2014   CLINICAL DATA:  Shortness of breath  EXAM: CT ANGIOGRAPHY CHEST WITH CONTRAST  TECHNIQUE: Multidetector CT imaging of the chest was performed using the standard protocol during bolus administration of intravenous contrast. Multiplanar CT image reconstructions and MIPs were obtained to evaluate the vascular anatomy.  CONTRAST:  170mL OMNIPAQUE IOHEXOL 350 MG/ML SOLN  COMPARISON:  None.  FINDINGS: The lungs are well aerated bilaterally without focal infiltrate or sizable effusion. Mild lingular atelectasis is noted. No pneumothorax or parenchymal nodule is seen. Diffuse emphysematous changes are noted.  The thoracic aorta and pulmonary artery are well visualized. No definitive pulmonary emboli are identified. No aneurysmal dilatation of the aorta is seen. No significant lymphadenopathy is noted. Coronary calcifications are noted.  Scanning into the upper abdomen reveals a 4.0 x 3.5 cm left adrenal mass. It has some fatty components within a may simply represent a myelolipoma. No other focal abnormality in the upper abdomen is seen. No acute bony abnormality is noted. Thoracic degenerative changes are seen.  Review of the MIP images confirms the above findings.  IMPRESSION: No evidence of pulmonary emboli.  Emphysematous changes.  Left adrenal mass as described. 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 Short-term followup versus is nonemergent MR evaluation can be pursued.  Although not mentioned in the body of the report, a small hiatal hernia is noted.   Electronically Signed   By: [name] M.D.   On: 03/31/2014 19:14   Dg Chest Portable 1 View  03/31/2014   CLINICAL DATA:   Port a short of breath  EXAM: PORTABLE CHEST - 1 VIEW  COMPARISON:  09/06/2012  FINDINGS: Normal cardiac silhouette. There is central venous pulmonary congestion similar to comparison exam. There is mild interstitial edema pattern which is also not changed. No overt pulmonary edema. No pleural fluid. No pneumothorax.  IMPRESSION: Central venous congestion and mild interstitial edema similar to comparison exam.   Electronically Signed   By: [name] M.D.   On: 03/31/2014 15:49    VITALS: Blood pressure 150/90, pulse 144, temperature 97.8 F (36.6 C), temperature source Oral, resp. rate 22, height 5' 4.5" (1.638 m), weight 228 lb (103.42 kg), SpO2 96.00%.  EXAM:  General: Alert, oriented x3, no distress, morbid obesity Head: no evidence of trauma, PERRL, EOMI, no exophtalmos or lid lag, no myxedema, no xanthelasma; normal ears, nose and oropharynx Neck: hard to see the jugular venous pulsations, suspect elevated; brisk carotid pulses without delay and no carotid bruits Chest: diminished breath sounds throughout, but clear to auscultation, no signs  of consolidation by percussion or palpation, normal fremitus, symmetrical and full respiratory excursions Cardiovascular: normal position and quality of the apical impulse, rapid regular rhythm, normal first heart sound and normal second heart sounds, no rubs or gallops, no murmur Abdomen: no tenderness or distention, no masses by palpation, no abnormal pulsatility or arterial bruits, normal bowel sounds, no hepatosplenomegaly Extremities: no clubbing, cyanosis; 1+ bilateral ankle edema; dressing on right pretibial area;  2+ radial, ulnar and brachial pulses bilaterally; 2+ right femoral, posterior tibial and dorsalis pedis pulses; 2+ left femoral, posterior tibial and dorsalis pedis pulses; no subclavian or femoral bruits Neurological: grossly nonfocal   IMPRESSION: Symptomatic Aflutter with RVR and acute diastolic HF - unsure if HF  (noncompliance with diuretics) triggered AFlutter or prolonged ignore tachyarrhythmia triggered AF. 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

## 2014-03-31 NOTE — ED Provider Notes (Signed)
CSN: [id number]     Arrival date & time 03/31/14  1449 History   First MD Initiated Contact with Patient 03/31/14 1457     Chief Complaint  Patient presents with  . Tachycardia     (Consider location/radiation/quality/duration/timing/severity/associated sxs/prior Treatment) HPI Patient presents to the emergency department. She and her sister admits this morning with her PCP and she was sent to the ED. Patient reports she is on home oxygen at 3 L per minute nasal cannula. She reports she was able to go without oxygen for 3-4 hours at a time however the past 2 months she's been getting more short of breath and having to wear her oxygen now all the time. She has dyspnea on exertion at about 20 feet. If she stops to rest she recovers within a few minutes. She has had swelling of her legs the right being worse than the left. She has had some mild cough for the past 2 months and states the mucus is "not yellow, not green, and not white). She denies chest pain, fever, or abdominal swelling. She denies having any palpitations. She denies any sweating. She has a stable umbilical hernia. She describes her shortness of breath as "hassling". She has a history of atrial fibrillation. When she was seen at her PCP office this morning he thought she was back in atrial fibrillation. She was sent to the ED. She states she took her Cardizem last yesterday morning. She states she takes it also in the afternoon.  PCP Dr Lysle Rubens Cardiology Dr Marlou Porch last seen in Jan  Past Medical History  Diagnosis Date  . Hypertension   . Thyroid disease   . COPD (chronic obstructive pulmonary disease)   . Anxiety   . H/O hiatal hernia   . Hypothyroidism   . Shortness of breath   . Atrial flutter 09/07/2012    , RVR/ hospital 12/13- rate control and Echo normal- on xarelto, when spontaneously converted she had bradycardia on Metoprolol and Digoxin  . GERD (gastroesophageal reflux disease)   . Rhinitis   . Parotitis     , on  CT of face- 12/13  . CHF (congestive heart failure)    Past Surgical History  Procedure Laterality Date  . Tonsillectomy    . Breast surgery      benign R brest CA   Family History  Problem Relation Age of Onset  . CAD Mother   . Stroke Mother   . Prostate cancer Father   . Dementia Father    History  Substance Use Topics  . Smoking status: Former Smoker -- 1.50 packs/day    Types: Cigarettes    Quit date: 07/23/2012  . Smokeless tobacco: Not on file  . Alcohol Use: No     Comment: occassionally   Lives with sister Uses oxygen 3 lpm [place]  OB History   Grav Para Term Preterm Abortions TAB SAB Ect Mult Living                 Review of Systems  All other systems reviewed and are negative.     Allergies  Review of patient's allergies indicates no known allergies.  Home Medications   Prior to Admission medications   Medication Sig Start Date End Date Taking? Authorizing Provider  acetaminophen (TYLENOL) 500 MG tablet Take 1,000 mg by mouth every 6 (six) hours as needed for mild pain.   Yes Historical Provider, MD  diltiazem (DILACOR XR) 120 MG 24 hr capsule Take 120 mg  by mouth 2 (two) times daily.   Yes Historical Provider, MD  furosemide (LASIX) 40 MG tablet Take 40 mg by mouth daily.   Yes Historical Provider, MD  levothyroxine (SYNTHROID, LEVOTHROID) 100 MCG tablet Take 100 mcg by mouth daily.   Yes Historical Provider, MD  lisinopril (PRINIVIL,ZESTRIL) 40 MG tablet Take 40 mg by mouth daily.   Yes Historical Provider, MD  potassium chloride SA (K-DUR,KLOR-CON) 20 MEQ tablet Take 10 mEq by mouth daily.   Yes Historical Provider, MD  Rivaroxaban (XARELTO) 20 MG TABS Take 1 tablet (20 mg total) by mouth daily with breakfast. 09/12/12  Yes Irven Shelling, MD   BP 148/90  Pulse 145  Temp(Src) 97.8 F (36.6 C) (Oral)  Resp 16  Ht 5' 4.5" (1.638 m)  Wt 228 lb (103.42 kg)  BMI 38.55 kg/m2  SpO2 95%  Vital signs normal except tachycardia  Physical Exam   Nursing note and vitals reviewed. Constitutional: She is oriented to person, place, and time. She appears well-developed and well-nourished.  Non-toxic appearance. She does not appear ill. No distress.  HENT:  Head: Normocephalic and atraumatic.  Right Ear: External ear normal.  Left Ear: External ear normal.  Nose: Nose normal. No mucosal edema or rhinorrhea.  Mouth/Throat: Oropharynx is clear and moist and mucous membranes are normal. No dental abscesses or uvula swelling.  Eyes: Conjunctivae and EOM are normal. Pupils are equal, round, and reactive to light.  Neck: Normal range of motion and full passive range of motion without pain. Neck supple.  Cardiovascular: Regular rhythm and normal heart sounds.  Tachycardia present.  Exam reveals no gallop and no friction rub.   No murmur heard. Pt has a regular tachycardia on her monitor. No obvious flutter waves  Pulmonary/Chest: Effort normal and breath sounds normal. No respiratory distress. She has no wheezes. She has no rhonchi. She has no rales. She exhibits no tenderness and no crepitus.  Appears SOB at rest, mild retractions.  Abdominal: Soft. Normal appearance and bowel sounds are normal. She exhibits no distension. There is no tenderness. There is no rebound and no guarding.  Musculoskeletal: Normal range of motion. She exhibits no edema and no tenderness.  Moves all extremities well.   Neurological: She is alert and oriented to person, place, and time. She has normal strength. No cranial nerve deficit.  Skin: Skin is warm, dry and intact. No rash noted. No erythema. No pallor.  Psychiatric: She has a normal mood and affect. Her speech is normal and behavior is normal. Her mood appears not anxious.    ED Course  Procedures (including critical care time)  Medications  diltiazem (CARDIZEM) injection 10 mg (0 mg Intravenous Stopped 03/31/14 1936)  diltiazem (CARDIZEM) 100 mg in dextrose 5 % 100 mL infusion (15 mg/hr Intravenous  Rate/Dose Change 03/31/14 1936)  iohexol (OMNIPAQUE) 350 MG/ML injection 100 mL (100 mLs Intravenous Contrast Given 03/31/14 1830)  furosemide (LASIX) injection 40 mg (40 mg Intravenous Given 03/31/14 2003)     Recheck 18:30 patient does not feel like she needs a breathing treatment. Discussed her test results and need to get spiral CT of her chest to look for a PE. Patient remains with tachycardia in the 140 range despite being on diltiazem drip.  1857 Dr Sallyanne Kuster will come see patient.   19:25 Dr Sallyanne Kuster has seen patient. She has not responded to the cardiazem drip and remains with HR in 140's.Marland Kitchen He is going to start her on amiodarone instead. He is going  to admit.     Author: Chapman Fitch Service: Vascular Lab Author Type: Cardiovascular Sonographer   Filed: 03/31/2014 5:17 PM Note Time: 03/31/2014 5:15 PM Status: Signed   Editor: Chapman Fitch (Cardiovascular Sonographer)      *PRELIMINARY RESULTS*  Vascular Ultrasound  Lower extremity venous duplex has been completed. Preliminary findings: Patient could not tolerate compressions on thighs, therefore femoral veins only evaluated with Color Doppler. No obvious evidence of DVT noted.  Landry Mellow, RDMS, RVT  03/31/2014, 5:16 PM       Labs Review Results for orders placed during the hospital encounter of 03/31/14  TROPONIN I      Result Value Ref Range   Troponin I <0.30  <0.30 ng/mL  APTT      Result Value Ref Range   aPTT 51 (*) 24 - 37 seconds  PROTIME-INR      Result Value Ref Range   Prothrombin Time 25.3 (*) 11.6 - 15.2 seconds   INR 2.30 (*) 0.00 - 1.49  PRO B NATRIURETIC PEPTIDE      Result Value Ref Range   Pro B Natriuretic peptide (BNP) 918.7 (*) 0 - 125 pg/mL  D-DIMER, QUANTITATIVE      Result Value Ref Range   D-Dimer, Quant 0.86 (*) 0.00 - 0.48 ug/mL-FEU  COMPREHENSIVE METABOLIC PANEL      Result Value Ref Range   Sodium 142  137 - 147 mEq/L   Potassium 4.4  3.7 - 5.3 mEq/L   Chloride 100  96 - 112 mEq/L   CO2 31   19 - 32 mEq/L   Glucose, Bld 101 (*) 70 - 99 mg/dL   BUN 11  6 - 23 mg/dL   Creatinine, Ser 0.64  0.50 - 1.10 mg/dL   Calcium 9.8  8.4 - 10.5 mg/dL   Total Protein 7.5  6.0 - 8.3 g/dL   Albumin 4.1  3.5 - 5.2 g/dL   AST 18  0 - 37 U/L   ALT 14  0 - 35 U/L   Alkaline Phosphatase 83  39 - 117 U/L   Total Bilirubin 0.4  0.3 - 1.2 mg/dL   GFR calc non Af Amer 90 (*) >90 mL/min   GFR calc Af Amer >90  >90 mL/min   Anion gap 11  5 - 15  CBC WITH DIFFERENTIAL      Result Value Ref Range   WBC 9.5  4.0 - 10.5 K/uL   RBC 4.01  3.87 - 5.11 MIL/uL   Hemoglobin 12.0  12.0 - 15.0 g/dL   HCT 38.5  36.0 - 46.0 %   MCV 96.0  78.0 - 100.0 fL   MCH 29.9  26.0 - 34.0 pg   MCHC 31.2  30.0 - 36.0 g/dL   RDW 13.9  11.5 - 15.5 %   Platelets 225  150 - 400 K/uL   Neutrophils Relative % 77  43 - 77 %   Neutro Abs 7.4  1.7 - 7.7 K/uL   Lymphocytes Relative 15  12 - 46 %   Lymphs Abs 1.4  0.7 - 4.0 K/uL   Monocytes Relative 7  3 - 12 %   Monocytes Absolute 0.6  0.1 - 1.0 K/uL   Eosinophils Relative 1  0 - 5 %   Eosinophils Absolute 0.1  0.0 - 0.7 K/uL   Basophils Relative 0  0 - 1 %   Basophils Absolute 0.0  0.0 - 0.1 K/uL   Laboratory interpretation all normal  except elevated ddimer   Imaging Review Ct Angio Chest W/cm &/or Wo Cm  03/31/2014   CLINICAL DATA:  Shortness of breath  EXAM: CT ANGIOGRAPHY CHEST WITH CONTRAST  TECHNIQUE: Multidetector CT imaging of the chest was performed using the standard protocol during bolus administration of intravenous contrast. Multiplanar CT image reconstructions and MIPs were obtained to evaluate the vascular anatomy.  CONTRAST:  158mL OMNIPAQUE IOHEXOL 350 MG/ML SOLN  COMPARISON:  None.  FINDINGS: The lungs are well aerated bilaterally without focal infiltrate or sizable effusion. Mild lingular atelectasis is noted. No pneumothorax or parenchymal nodule is seen. Diffuse emphysematous changes are noted.  The thoracic aorta and pulmonary artery are well visualized.  No definitive pulmonary emboli are identified. No aneurysmal dilatation of the aorta is seen. No significant lymphadenopathy is noted. Coronary calcifications are noted.  Scanning into the upper abdomen reveals a 4.0 x 3.5 cm left adrenal mass. It has some fatty components within a may simply represent a myelolipoma. No other focal abnormality in the upper abdomen is seen. No acute bony abnormality is noted. Thoracic degenerative changes are seen.  Review of the MIP images confirms the above findings.  IMPRESSION: No evidence of pulmonary emboli.  Emphysematous changes.  Left adrenal mass as described. 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 Short-term followup versus is nonemergent MR evaluation can be pursued.  Although not mentioned in the body of the report, a small hiatal hernia is noted.   Electronically Signed   By: [name] M.D.   On: 03/31/2014 19:14   Dg Chest Portable 1 View  03/31/2014   CLINICAL DATA:  Port a short of breath  EXAM: PORTABLE CHEST - 1 VIEW  COMPARISON:  09/06/2012  FINDINGS: Normal cardiac silhouette. There is central venous pulmonary congestion similar to comparison exam. There is mild interstitial edema pattern which is also not changed. No overt pulmonary edema. No pleural fluid. No pneumothorax.  IMPRESSION: Central venous congestion and mild interstitial edema similar to comparison exam.   Electronically Signed   By: [name] M.D.   On: 03/31/2014 15:49     # 1  EKG Interpretation   Date/Time:  Thursday March 31 2014 15:00:59 EDT Ventricular Rate:  143 PR Interval:  67 QRS Duration: 78 QT Interval:  387 QTC Calculation: 597 R Axis:   43 Text Interpretation:  Sinus tachycardia Low voltage, precordial leads  Minimal ST depression, inferior leads Prolonged QT interval Artifact in  lead(s) I II III aVR aVL aVF V1 V2 Since last tracing rate faster (16 Dec 2012) Confirmed by Karam Dunson  MD-I, Amour Trigg ([id number]) on 03/31/2014 3:19:00 PM     #2   Date: 03/31/2014  Rate:  143  Rhythm: sinus tachycardia  QRS Axis: normal  Intervals: normal  ST/T Wave abnormalities: nonspecific ST changes  Conduction Disutrbances:none  Narrative Interpretation:   Old EKG Reviewed: unchanged   MDM   Final diagnoses:  Atrial flutter, unspecified  Dyspnea    Plan admission  Rolland Porter, MD, Abr[facility] Sander     Janice Norrie, MD 03/31/14 2244

## 2014-03-31 NOTE — Progress Notes (Signed)
Amiodarone Drug - Drug Interaction Consult Note  Recommendations:  -Monitor for increased risk of bleeding while on Xarelto -Monitor for s/sx bradycardia -Continue potassium supplement while on Lasix   Amiodarone is metabolized by the cytochrome P450 system and therefore has the potential to cause many drug interactions. Amiodarone has an average plasma half-life of 50 days (range 20 to 100 days).   There is potential for drug interactions to occur several weeks or months after stopping treatment and the onset of drug interactions may be slow after initiating amiodarone.   [x]  Anticoagulants: Amiodarone can increase anticoagulant effect. Consider warfarin dose reduction. Patients should be monitored closely and the dose of anticoagulant altered accordingly, remembering that amiodarone levels take several weeks to stabilize.  [x]   Calcium channel blockers (diltiazem and verapamil): increased risk of bradycardia, AV block and myocardial depression.   [x]  Diuretics: increased risk of cardiotoxicity if hypokalemia occurs.     Hughes Better, PharmD, BCPS Clinical Pharmacist Pager: [phone] 03/31/2014 8:36 PM

## 2014-03-31 NOTE — ED Notes (Signed)
Ann Cousey(sister) - [phone]

## 2014-03-31 NOTE — ED Notes (Signed)
Pt presents from [facility] via [facility] with c/o Tachycardia and increased shortness of breath over the past weak. Pt denies chest pain, but reports she has had worsening DOE and increased tiredness x1 week. Pt has hx emphysema and reports she is on 3L [place] at home. Pt alert and oriented x4.

## 2014-04-01 DIAGNOSIS — I059 Rheumatic mitral valve disease, unspecified: Secondary | ICD-10-CM

## 2014-04-01 LAB — BASIC METABOLIC PANEL
Anion gap: 13 (ref 5–15)
BUN: 14 mg/dL (ref 6–23)
CO2: 33 meq/L — AB (ref 19–32)
CREATININE: 0.88 mg/dL (ref 0.50–1.10)
Calcium: 9.6 mg/dL (ref 8.4–10.5)
Chloride: 95 mEq/L — ABNORMAL LOW (ref 96–112)
GFR calc non Af Amer: 66 mL/min — ABNORMAL LOW (ref >90)
GFR, EST AFRICAN AMERICAN: 76 mL/min — AB (ref >90)
GLUCOSE: 117 mg/dL — AB (ref 70–99)
Potassium: 4.6 mEq/L (ref 3.7–5.3)
Sodium: 141 mEq/L (ref 137–147)

## 2014-04-01 LAB — PRO B NATRIURETIC PEPTIDE: Pro B Natriuretic peptide (BNP): 993.8 pg/mL — ABNORMAL HIGH (ref 0–125)

## 2014-04-01 LAB — TSH: TSH: 1.22 u[IU]/mL (ref 0.350–4.500)

## 2014-04-01 LAB — GLUCOSE, CAPILLARY: GLUCOSE-CAPILLARY: 123 mg/dL — AB (ref 70–99)

## 2014-04-01 MED ORDER — SODIUM CHLORIDE 0.9 % IV SOLN
INTRAVENOUS | Status: DC
  Administered 2014-04-01: 20:00:00 via INTRAVENOUS

## 2014-04-01 MED ORDER — PERFLUTREN LIPID MICROSPHERE
1.0000 mL | INTRAVENOUS | Status: AC | PRN
  Administered 2014-04-01: 2 mL via INTRAVENOUS
  Filled 2014-04-01: qty 10

## 2014-04-01 MED ORDER — PERFLUTREN LIPID MICROSPHERE
INTRAVENOUS | Status: AC
Start: 2014-04-01 — End: 2014-04-02
  Filled 2014-04-01: qty 10

## 2014-04-01 NOTE — Progress Notes (Signed)
    Subjective:  Denies CP; mild dyspnea   Objective:  Filed Vitals:   04/01/14 0348 04/01/14 0400 04/01/14 0740 04/01/14 0818  BP:  121/90 142/83   Pulse:  122 131   Temp: 98.3 F (36.8 C)   98.1 F (36.7 C)  TempSrc: Oral   Oral  Resp:   24   Height:      Weight: 242 lb 8 oz (109.997 kg)     SpO2: 96%  96%     Intake/Output from previous day:  Intake/Output Summary (Last 24 hours) at 04/01/14 1035 Last data filed at 04/01/14 0700  Gross per 24 hour  Intake  566.7 ml  Output    500 ml  Net   66.7 ml    Physical Exam: Physical exam: Well-developed obese in no acute distress.  Skin is warm and dry.  HEENT is normal.  Neck is supple.  Chest with diminished BS throughout Cardiovascular exam is regular and tachycardic Abdominal exam nontender or distended. No masses palpated. Extremities show no edema. Small abrasion RLE neuro grossly intact    Lab Results: Basic Metabolic Panel:  Recent Labs  03/31/14 1555 04/01/14 0339  NA 142 141  K 4.4 4.6  CL 100 95*  CO2 31 33*  GLUCOSE 101* 117*  BUN 11 14  CREATININE 0.64 0.88  CALCIUM 9.8 9.6   CBC:  Recent Labs  03/31/14 1555  WBC 9.5  NEUTROABS 7.4  HGB 12.0  HCT 38.5  MCV 96.0  PLT 225   Cardiac Enzymes:  Recent Labs  03/31/14 1555  TROPONINI <0.30     Assessment/Plan:  1 atrial flutter-patient remains in atrial flutter this morning. Her rate is elevated. She is symptomatic with increased dyspnea on exertion. She has been taking her xarelto without interruption. Continue Cardizem. She is receiving IV amiodarone as well. We will try to arrange elective cardioversion today. If successful she potentially could be discharged tomorrow complete oral amiodarone load as an outpatient. Echocardiogram pending. 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 4 left adrenal mass-Plan MRI as an outpatient. 5 acute diastolic congestive heart failure-most likely  related to atrial flutter. Continue gentle diuresis and follow renal function.  Kirk Ruths 04/01/2014, 10:35 AM

## 2014-04-01 NOTE — Progress Notes (Signed)
Echocardiogram 2D Echocardiogram with Definity has been performed.  Daemion Mcniel 04/01/2014, 12:52 PM

## 2014-04-01 NOTE — Consult Note (Addendum)
WOC wound consult note Reason for Consult: Consult requested for abrasion which occurred 2 weeks ago when pt reports she hit her leg against the car door. She has been using alcohol and gauze at home. Wound type: Full thickness Measurement:1.5X1.5X.2cm Wound bed: 50% yellow/50% red Drainage (amount, consistency, odor) Mod amt yellow drainage, no odor Periwound: Intact skin surrounding, generalized edema. Dressing procedure/placement/frequency: Silicone foam dressing to protect, absorb drainage, and promote healing. Discussed use of antimicrobial soap and water to cleanse and avoiding alcohol and peroxide and adherent dressings, pt verbalizes understanding. Please re-consult if further assistance is needed.  Thank-you,  Julien Girt MSN, [place], [place], [place], [place]

## 2014-04-01 NOTE — Care Management Note (Addendum)
  Page 1 of 1   04/05/2014     3:46:34 PM CARE MANAGEMENT NOTE 04/05/2014  Patient:  Caitlin Hampton, Caitlin Hampton   Account Number:  [id number]  Date Initiated:  04/01/2014  Documentation initiated by:  Luz Lex  Subjective/Objective Assessment:   Admitted with Afib - CHF     Action/Plan:   Anticipated DC Date:  04/06/2014   Anticipated DC Plan:  [place]  CM consult      Choice offered to / List presented to:             Status of service:  Completed, signed off Medicare Important Message given?  YES (If response is "NO", the following Medicare IM given date fields will be blank) Date Medicare IM given:  04/05/2014 Medicare IM given by:   Date Additional Medicare IM given:   Additional Medicare IM given by:    Discharge Disposition:  HOME/SELF CARE  Per UR Regulation:  Reviewed for med. necessity/level of care/duration of stay  If discussed at [place] of Stay Meetings, dates discussed:    Comments:  Taysean Wager RN, BSN, MSHL, CCM  Nurse - Case Manager,  ([facility])  [phone]  04/05/2014 Home / [place] Sister [place]al[place]                 [place],Bill Brother [phone]  04-01-14 11:30am Luz Lex, RNBSN - [phone] Continues in Afib - on IV amio. Lives alone - last admission went to sisters house and also had [facility] to follow.

## 2014-04-01 NOTE — Progress Notes (Signed)
Patient started on amiodarone gtt at 2145, at this time, HR still in 140s. Dr. Inda Castle notified and order for po cardizem and iv metoprolol 5mg  received.

## 2014-04-02 ENCOUNTER — Encounter (HOSPITAL_COMMUNITY)
Admission: EM | Disposition: A | Discharge status: Home / Self Care | Payer: Medicare Other | Source: Home / Self Care | Attending: Cardiovascular Disease

## 2014-04-02 ENCOUNTER — Encounter (HOSPITAL_COMMUNITY): Payer: Medicare Other | Admitting: Anesthesiology

## 2014-04-02 ENCOUNTER — Inpatient Hospital Stay (HOSPITAL_COMMUNITY): Payer: Medicare Other | Admitting: Anesthesiology

## 2014-04-02 DIAGNOSIS — I4892 Unspecified atrial flutter: Secondary | ICD-10-CM

## 2014-04-02 HISTORY — PX: CARDIOVERSION: SHX1299

## 2014-04-02 LAB — CBC
HEMATOCRIT: 38.9 % (ref 36.0–46.0)
Hemoglobin: 11.9 g/dL — ABNORMAL LOW (ref 12.0–15.0)
MCH: 29.5 pg (ref 26.0–34.0)
MCHC: 30.6 g/dL (ref 30.0–36.0)
MCV: 96.3 fL (ref 78.0–100.0)
Platelets: 214 10*3/uL (ref 150–400)
RBC: 4.04 MIL/uL (ref 3.87–5.11)
RDW: 14.3 % (ref 11.5–15.5)
WBC: 8.8 10*3/uL (ref 4.0–10.5)

## 2014-04-02 LAB — BASIC METABOLIC PANEL
ANION GAP: 13 (ref 5–15)
BUN: 16 mg/dL (ref 6–23)
CHLORIDE: 95 meq/L — AB (ref 96–112)
CO2: 31 meq/L (ref 19–32)
Calcium: 9.4 mg/dL (ref 8.4–10.5)
Creatinine, Ser: 0.81 mg/dL (ref 0.50–1.10)
GFR calc non Af Amer: 73 mL/min — ABNORMAL LOW (ref >90)
GFR, EST AFRICAN AMERICAN: 85 mL/min — AB (ref >90)
Glucose, Bld: 115 mg/dL — ABNORMAL HIGH (ref 70–99)
POTASSIUM: 3.8 meq/L (ref 3.7–5.3)
SODIUM: 139 meq/L (ref 137–147)

## 2014-04-02 LAB — GLUCOSE, CAPILLARY
GLUCOSE-CAPILLARY: 122 mg/dL — AB (ref 70–99)
GLUCOSE-CAPILLARY: 132 mg/dL — AB (ref 70–99)
Glucose-Capillary: 107 mg/dL — ABNORMAL HIGH (ref 70–99)

## 2014-04-02 SURGERY — CARDIOVERSION
Anesthesia: General

## 2014-04-02 MED ORDER — LIDOCAINE HCL (CARDIAC) 20 MG/ML IV SOLN
INTRAVENOUS | Status: DC | PRN
  Administered 2014-04-02: 80 mg via INTRAVENOUS

## 2014-04-02 MED ORDER — PROPOFOL 10 MG/ML IV BOLUS
INTRAVENOUS | Status: DC | PRN
  Administered 2014-04-02: 60 mg via INTRAVENOUS

## 2014-04-02 MED ORDER — DILTIAZEM HCL 30 MG PO TABS
30.0000 mg | ORAL_TABLET | Freq: Four times a day (QID) | ORAL | Status: DC
  Administered 2014-04-02 – 2014-04-04 (×7): 30 mg via ORAL
  Filled 2014-04-02 (×13): qty 1

## 2014-04-02 MED ORDER — PROPOFOL 10 MG/ML IV BOLUS
INTRAVENOUS | Status: AC
  Filled 2014-04-02: qty 20

## 2014-04-02 MED ORDER — SODIUM CHLORIDE 0.9 % IV SOLN
INTRAVENOUS | Status: DC | PRN
  Administered 2014-04-02: 10:00:00 via INTRAVENOUS

## 2014-04-02 MED ORDER — LIDOCAINE HCL (CARDIAC) 20 MG/ML IV SOLN
INTRAVENOUS | Status: AC
  Filled 2014-04-02: qty 5

## 2014-04-02 NOTE — Anesthesia Preprocedure Evaluation (Addendum)
Anesthesia Evaluation  Patient identified by MRN, date of birth, ID band Patient awake    Reviewed: Allergy & Precautions, H&P , NPO status , Patient's Chart, lab work & pertinent test results, reviewed documented beta blocker date and time   Airway Mallampati: II  Neck ROM: Full    Dental  (+) Edentulous Upper, Edentulous Lower, Dental Advisory Given   Pulmonary COPD COPD inhaler, former smoker,          Cardiovascular hypertension, Pt. on medications +CHF Rate:Tachycardia  Echo 04/01/2014: LV EF: 40% -   45%   Neuro/Psych PSYCHIATRIC DISORDERS Anxiety negative neurological ROS     GI/Hepatic Neg liver ROS, hiatal hernia, GERD-  ,  Endo/Other  Hypothyroidism   Renal/GU negative Renal ROS     Musculoskeletal   Abdominal   Peds  Hematology   Anesthesia Other Findings   Reproductive/Obstetrics                        Anesthesia Physical Anesthesia Plan  ASA: III  Anesthesia Plan: General   Post-op Pain Management:    Induction: Intravenous  Airway Management Planned: Mask  Additional Equipment:   Intra-op Plan:   Post-operative Plan:   Informed Consent: I have reviewed the patients History and Physical, chart, labs and discussed the procedure including the risks, benefits and alternatives for the proposed anesthesia with the patient or authorized representative who has indicated his/her understanding and acceptance.   Dental advisory given  Plan Discussed with: CRNA, Anesthesiologist and Surgeon  Anesthesia Plan Comments:        Anesthesia Quick Evaluation

## 2014-04-02 NOTE — Anesthesia Postprocedure Evaluation (Signed)
  Anesthesia Post-op Note  Patient: Caitlin Hampton  Procedure(s) Performed: Procedure(s): CARDIOVERSION (N/A)  Patient Location: ICU  Anesthesia Type:General  Level of Consciousness: awake, alert  and oriented  Airway and Oxygen Therapy: Patient Spontanous Breathing and Patient connected to nasal cannula oxygen  Post-op Pain: none  Post-op Assessment: Post-op Vital signs reviewed, Patient's Cardiovascular Status Stable, Respiratory Function Stable, Patent Airway and No signs of Nausea or vomiting  Post-op Vital Signs: Reviewed and stable  Last Vitals:  Filed Vitals:   04/02/14 0800  BP: 124/96  Pulse:   Temp:   Resp:     Complications: No apparent anesthesia complications

## 2014-04-02 NOTE — Transfer of Care (Signed)
Immediate Anesthesia Transfer of Care Note  Patient: Caitlin Hampton  Procedure(s) Performed: Procedure(s): CARDIOVERSION (N/A)  Patient Location: PACU and ICU  Anesthesia Type:General  Level of Consciousness: awake, alert  and oriented  Airway & Oxygen Therapy: Patient Spontanous Breathing and Patient connected to nasal cannula oxygen  Post-op Assessment: Report given to PACU RN and Post -op Vital signs reviewed and stable  Post vital signs: Reviewed and stable  Complications: No apparent anesthesia complications

## 2014-04-02 NOTE — CV Procedure (Signed)
DCC:  Indication rapid symptomatic flutter with diastolic heart failure Dr Tobias Alexander anesthesia  60 mg diprovan and 60 mg lidocaine Singe 120J biphasic shock  Converted from flutter at rate of 138 to NSR rate 78 No immediate neurologic sequelae  Succesful [facility]  Jenkins Rouge

## 2014-04-02 NOTE — Progress Notes (Signed)
Patient ID: Caitlin Hampton, female   DOB: December 21, 1945, 68 y.o.   MRN: [id number]    Subjective:  Denies CP; mild dyspnea  Ready for [facility]  Discussed risks    Objective:  Filed Vitals:   04/02/14 0349 04/02/14 0350 04/02/14 0757 04/02/14 0800  BP: 138/108  124/96 124/96  Pulse:   134   Temp:  98.3 F (36.8 C) 97.8 F (36.6 C)   TempSrc:  Oral Oral   Resp:      Height:      Weight:  240 lb 1.6 oz (108.909 kg)    SpO2:  96% 98% 95%    Intake/Output from previous day:  Intake/Output Summary (Last 24 hours) at 04/02/14 1014 Last data filed at 04/02/14 0800  Gross per 24 hour  Intake  967.4 ml  Output   1125 ml  Net -157.6 ml    Physical Exam: Physical exam: Well-developed obese in no acute distress.  Skin is warm and dry.  HEENT is normal.  Neck is supple.  Chest with diminished BS throughout mild expitory wheezing  Cardiovascular exam is regular and tachycardic Abdominal exam nontender or distended. No masses palpated. Extremities show no edema. Small abrasion RLE neuro grossly intact    Lab Results: Basic Metabolic Panel:  Recent Labs  04/01/14 0339 04/02/14 0235  NA 141 139  K 4.6 3.8  CL 95* 95*  CO2 33* 31  GLUCOSE 117* 115*  BUN 14 16  CREATININE 0.88 0.81  CALCIUM 9.6 9.4   CBC:  Recent Labs  03/31/14 1555 04/02/14 0235  WBC 9.5 8.8  NEUTROABS 7.4  --   HGB 12.0 11.9*  HCT 38.5 38.9  MCV 96.0 96.3  PLT 225 214   Cardiac Enzymes:  Recent Labs  03/31/14 1555  TROPONINI <0.30     Assessment/Plan:  1 atrial flutter-patient remains in atrial flutter this morning. Her rate is elevated. She is symptomatic with increased dyspnea on exertion. She has been taking her xarelto without interruption. Continue Cardizem. She is receiving IV amiodarone as well.  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 Improved  4 left adrenal mass-Plan MRI as an outpatient. 5 acute diastolic congestive heart  failure-most likely related to atrial flutter. Continue gentle diuresis renal function stable    D/C tomorrow if [facility] successful  Jenkins Rouge 04/02/2014, 10:14 AM

## 2014-04-02 NOTE — Progress Notes (Signed)
Pharmacy note:  Notes mention continue Diltiazem but no active order. Last dose Diltiazem CD 120 mg (x 1) given 7/10 ~12:30am.  Home dose listed as Dilacor XR 120 mg BID.  Spoke with R. Barrett, PA-C. Begin Diltiazem 30 mg q6hrs tonight, hold for HR< 60.  MD to address on 7/12 for consideration of conversion to long-acting formulation.  Kelvin Cellar, RPh Pager: [phone] 04/02/2014 4:09 PM

## 2014-04-02 NOTE — Progress Notes (Signed)
DCCV set up for 10 am.

## 2014-04-03 DIAGNOSIS — N179 Acute kidney failure, unspecified: Secondary | ICD-10-CM | POA: Diagnosis not present

## 2014-04-03 LAB — BASIC METABOLIC PANEL
ANION GAP: 14 (ref 5–15)
BUN: 28 mg/dL — ABNORMAL HIGH (ref 6–23)
CO2: 30 mEq/L (ref 19–32)
Calcium: 8.8 mg/dL (ref 8.4–10.5)
Chloride: 95 mEq/L — ABNORMAL LOW (ref 96–112)
Creatinine, Ser: 1.56 mg/dL — ABNORMAL HIGH (ref 0.50–1.10)
GFR calc non Af Amer: 33 mL/min — ABNORMAL LOW (ref >90)
GFR, EST AFRICAN AMERICAN: 38 mL/min — AB (ref >90)
Glucose, Bld: 111 mg/dL — ABNORMAL HIGH (ref 70–99)
POTASSIUM: 4.6 meq/L (ref 3.7–5.3)
SODIUM: 139 meq/L (ref 137–147)

## 2014-04-03 MED ORDER — FUROSEMIDE 20 MG PO TABS
20.0000 mg | ORAL_TABLET | Freq: Every day | ORAL | Status: DC
  Administered 2014-04-04: 20 mg via ORAL
  Filled 2014-04-03: qty 1

## 2014-04-03 MED ORDER — SODIUM CHLORIDE 0.9 % IV BOLUS (SEPSIS)
500.0000 mL | Freq: Once | INTRAVENOUS | Status: AC
  Administered 2014-04-03: 11:00:00 via INTRAVENOUS

## 2014-04-03 MED ORDER — AMIODARONE HCL 200 MG PO TABS
400.0000 mg | ORAL_TABLET | Freq: Every day | ORAL | Status: DC
  Administered 2014-04-03 – 2014-04-04 (×2): 400 mg via ORAL
  Filled 2014-04-03 (×2): qty 2

## 2014-04-03 NOTE — Progress Notes (Signed)
Report given to receiving RN. Patient in bed resting with family at bedside. No signs or symptoms of distress or discomfort.

## 2014-04-03 NOTE — Progress Notes (Addendum)
DAILY PROGRESS NOTE  Subjective:  No issues overnight. Feels better today. Maintaining sinus. Creatinine has jumped up to 1.5 today.  Objective:  Temp:  [97.6 F (36.4 C)-98.8 F (37.1 C)] 98 F (36.7 C) (07/12 0723) Pulse Rate:  [65-81] 65 (07/12 0357) Resp:  [19-23] 19 (07/12 0723) BP: (97-123)/(42-65) 121/59 mmHg (07/12 0723) SpO2:  [97 %-100 %] 98 % (07/12 0754) Weight:  [241 lb 8 oz (109.544 kg)] 241 lb 8 oz (109.544 kg) (07/12 0357) Weight change: 1 lb 6.4 oz (0.635 kg)  Intake/Output from previous day: 07/11 0701 - 07/12 0700 In: 1334.1 [P.O.:720; I.V.:614.1] Out: 1150 [Urine:1150]  Intake/Output from this shift: Total I/O In: 293.4 [P.O.:240; I.V.:53.4] Out: 200 [Urine:200]  Medications: Current Facility-Administered Medications  Medication Dose Route Frequency Provider Last Rate Last Dose  . 0.9 %  sodium chloride infusion   Intravenous Continuous Mihai Croitoru, MD 10 mL/hr at 04/03/14 0700    . acetaminophen (TYLENOL) tablet 1,000 mg  1,000 mg Oral Q6H PRN Mihai Croitoru, MD      . amiodarone (NEXTERONE PREMIX) 360 MG/200ML (1.8 mg/mL) IV infusion  30 mg/hr Intravenous Continuous Mihai Croitoru, MD 16.7 mL/hr at 04/03/14 0700 30 mg/hr at 04/03/14 0700  . diltiazem (CARDIZEM) tablet 30 mg  30 mg Oral 4 times per day Lonn Georgia, PA-C   30 mg at 04/03/14 0093  . furosemide (LASIX) tablet 40 mg  40 mg Oral Daily Mihai Croitoru, MD   40 mg at 04/02/14 1802  . levothyroxine (SYNTHROID, LEVOTHROID) tablet 100 mcg  100 mcg Oral QAC breakfast Sanda Klein, MD   100 mcg at 04/03/14 0747  . lisinopril (PRINIVIL,ZESTRIL) tablet 40 mg  40 mg Oral Daily Mihai Croitoru, MD   40 mg at 04/03/14 0922  . potassium chloride SA (K-DUR,KLOR-CON) CR tablet 10 mEq  10 mEq Oral Daily Mihai Croitoru, MD   10 mEq at 04/03/14 0922  . rivaroxaban (XARELTO) tablet 20 mg  20 mg Oral Q breakfast Sanda Klein, MD   20 mg at 04/03/14 0747    Physical Exam: General appearance:  alert and no distress Lungs: diminished breath sounds bilaterally Heart: regular rate and rhythm, S1, S2 normal, no murmur, click, rub or gallop Abdomen: obese, soft, non-tender Extremities: extremities normal, atraumatic, no cyanosis or edema  Lab Results: Results for orders placed during the hospital encounter of 03/31/14 (from the past 48 hour(s))  GLUCOSE, CAPILLARY     Status: Abnormal   Collection Time    04/01/14  5:06 PM      Result Value Ref Range   Glucose-Capillary 123 (*) 70 - 99 mg/dL  BASIC METABOLIC PANEL     Status: Abnormal   Collection Time    04/02/14  2:35 AM      Result Value Ref Range   Sodium 139  137 - 147 mEq/L   Potassium 3.8  3.7 - 5.3 mEq/L   Comment: DELTA CHECK NOTED   Chloride 95 (*) 96 - 112 mEq/L   CO2 31  19 - 32 mEq/L   Glucose, Bld 115 (*) 70 - 99 mg/dL   BUN 16  6 - 23 mg/dL   Creatinine, Ser 0.81  0.50 - 1.10 mg/dL   Calcium 9.4  8.4 - 10.5 mg/dL   GFR calc non Af Amer 73 (*) >90 mL/min   GFR calc Af Amer 85 (*) >90 mL/min   Comment: (NOTE)     The eGFR has been calculated using the CKD EPI equation.  This calculation has not been validated in all clinical situations.     eGFR's persistently <90 mL/min signify possible Chronic Kidney     Disease.   Anion gap 13  5 - 15  CBC     Status: Abnormal   Collection Time    04/02/14  2:35 AM      Result Value Ref Range   WBC 8.8  4.0 - 10.5 K/uL   RBC 4.04  3.87 - 5.11 MIL/uL   Hemoglobin 11.9 (*) 12.0 - 15.0 g/dL   HCT 38.9  36.0 - 46.0 %   MCV 96.3  78.0 - 100.0 fL   MCH 29.5  26.0 - 34.0 pg   MCHC 30.6  30.0 - 36.0 g/dL   RDW 14.3  11.5 - 15.5 %   Platelets 214  150 - 400 K/uL  GLUCOSE, CAPILLARY     Status: Abnormal   Collection Time    04/02/14  8:01 AM      Result Value Ref Range   Glucose-Capillary 122 (*) 70 - 99 mg/dL  GLUCOSE, CAPILLARY     Status: Abnormal   Collection Time    04/02/14 12:01 PM      Result Value Ref Range   Glucose-Capillary 132 (*) 70 - 99 mg/dL    GLUCOSE, CAPILLARY     Status: Abnormal   Collection Time    04/02/14  4:43 PM      Result Value Ref Range   Glucose-Capillary 107 (*) 70 - 99 mg/dL  BASIC METABOLIC PANEL     Status: Abnormal   Collection Time    04/03/14  3:20 AM      Result Value Ref Range   Sodium 139  137 - 147 mEq/L   Potassium 4.6  3.7 - 5.3 mEq/L   Comment: DELTA CHECK NOTED   Chloride 95 (*) 96 - 112 mEq/L   CO2 30  19 - 32 mEq/L   Glucose, Bld 111 (*) 70 - 99 mg/dL   BUN 28 (*) 6 - 23 mg/dL   Comment: DELTA CHECK NOTED   Creatinine, Ser 1.56 (*) 0.50 - 1.10 mg/dL   Comment: DELTA CHECK NOTED   Calcium 8.8  8.4 - 10.5 mg/dL   GFR calc non Af Amer 33 (*) >90 mL/min   GFR calc Af Amer 38 (*) >90 mL/min   Comment: (NOTE)     The eGFR has been calculated using the CKD EPI equation.     This calculation has not been validated in all clinical situations.     eGFR's persistently <90 mL/min signify possible Chronic Kidney     Disease.   Anion gap 14  5 - 15    Imaging: No results found.  Assessment:  Active Problems:   Atrial flutter with rapid ventricular response, unknown duration   Tobacco abuse   Acute diastolic CHF (congestive heart failure)   Hypertension   COPD (chronic obstructive pulmonary disease)   Acute renal failure (ARF)   Plan:  1. Hold lasix today and lisinopril. Give back 500 cc normal saline over 5 hours. Change amiodarone to 400 mg po daily today. Ok to transfer to telemetry floor. Hopeful d/c tomorrow if creatinine is improved.  Time Spent Directly with Patient:  15 minutes  Length of Stay:  LOS: 3 days   Pixie Casino, MD, [facility] Attending Cardiologist [facility]  Kelisha Dall C 04/03/2014, 10:40 AM

## 2014-04-04 ENCOUNTER — Encounter (HOSPITAL_COMMUNITY): Payer: Self-pay | Admitting: Cardiovascular Disease

## 2014-04-04 LAB — BASIC METABOLIC PANEL
Anion gap: 9 (ref 5–15)
BUN: 28 mg/dL — AB (ref 6–23)
CO2: 32 mEq/L (ref 19–32)
Calcium: 9 mg/dL (ref 8.4–10.5)
Chloride: 98 mEq/L (ref 96–112)
Creatinine, Ser: 1.05 mg/dL (ref 0.50–1.10)
GFR, EST AFRICAN AMERICAN: 62 mL/min — AB (ref >90)
GFR, EST NON AFRICAN AMERICAN: 53 mL/min — AB (ref >90)
Glucose, Bld: 100 mg/dL — ABNORMAL HIGH (ref 70–99)
Potassium: 4.7 mEq/L (ref 3.7–5.3)
SODIUM: 139 meq/L (ref 137–147)

## 2014-04-04 MED ORDER — AMIODARONE HCL 400 MG PO TABS: 400.0000 mg | ORAL_TABLET | Freq: Every day | ORAL | Status: DC

## 2014-04-04 MED ORDER — DILTIAZEM HCL ER COATED BEADS 120 MG PO CP24
120.0000 mg | ORAL_CAPSULE | Freq: Every day | ORAL | Status: DC
  Administered 2014-04-04: 120 mg via ORAL
  Filled 2014-04-04: qty 1

## 2014-04-04 MED ORDER — LISINOPRIL 20 MG PO TABS
20.0000 mg | ORAL_TABLET | Freq: Every day | ORAL | Status: DC
  Administered 2014-04-04: 20 mg via ORAL
  Filled 2014-04-04 (×2): qty 1

## 2014-04-04 MED ORDER — LISINOPRIL 20 MG PO TABS: 20.0000 mg | ORAL_TABLET | Freq: Every day | ORAL | Status: DC

## 2014-04-04 MED ORDER — FUROSEMIDE 20 MG PO TABS: 20.0000 mg | ORAL_TABLET | Freq: Every day | ORAL | Status: DC

## 2014-04-04 NOTE — Progress Notes (Signed)
Patient Name: Caitlin Hampton Date of Encounter: 04/04/2014     Active Problems:   Atrial flutter with rapid ventricular response, unknown duration   Tobacco abuse   Acute diastolic CHF (congestive heart failure)   Hypertension   COPD (chronic obstructive pulmonary disease)   Acute renal failure (ARF)    SUBJECTIVE  The patient is feeling well this morning.  She is maintaining normal sinus rhythm after cardioversion.  Her renal function has improved overnight.  Blood pressure mildly elevated.  She has been off her lisinopril since admission.  CURRENT MEDS . amiodarone  400 mg Oral Daily  . diltiazem  120 mg Oral Daily  . furosemide  20 mg Oral Daily  . levothyroxine  100 mcg Oral QAC breakfast  . lisinopril  20 mg Oral Daily  . potassium chloride SA  10 mEq Oral Daily  . rivaroxaban  20 mg Oral Q breakfast    OBJECTIVE  Filed Vitals:   04/03/14 2039 04/04/14 0228 04/04/14 0406 04/04/14 0408  BP: 124/55 114/57  128/96  Pulse: 71 60  74  Temp: 98.1 F (36.7 C) 97.5 F (36.4 C)  97.4 F (36.3 C)  TempSrc: Oral Oral  Oral  Resp: 19 18  18   Height:      Weight:   243 lb 12.8 oz (110.587 kg)   SpO2: 100% 99%  100%    Intake/Output Summary (Last 24 hours) at 04/04/14 0840 Last data filed at 04/04/14 5573  Gross per 24 hour  Intake 1034.17 ml  Output   1850 ml  Net -815.83 ml   Filed Weights   04/03/14 0357 04/03/14 1300 04/04/14 0406  Weight: 241 lb 8 oz (109.544 kg) 244 lb 0.8 oz (110.7 kg) 243 lb 12.8 oz (110.587 kg)    PHYSICAL EXAM  General: Pleasant, NAD. Neuro: Alert and oriented X 3. Moves all extremities spontaneously. Psych: Normal affect. HEENT:  Normal  Neck: Supple without bruits or JVD. Lungs:  Resp regular and unlabored, diminished breath sounds at bases. Heart: RRR no s3, s4, or murmurs. Abdomen: Soft, non-tender, non-distended, BS + x 4.  Extremities: No clubbing, cyanosis or edema. DP/PT/Radials 2+ and equal bilaterally.  Accessory  Clinical Findings  CBC  Recent Labs  04/02/14 0235  WBC 8.8  HGB 11.9*  HCT 38.9  MCV 96.3  PLT 220   Basic Metabolic Panel  Recent Labs  04/03/14 0320 04/04/14 0400  NA 139 139  K 4.6 4.7  CL 95* 98  CO2 30 32  GLUCOSE 111* 100*  BUN 28* 28*  CREATININE 1.56* 1.05  CALCIUM 8.8 9.0   Liver Function Tests No results found for this basename: AST, ALT, ALKPHOS, BILITOT, PROT, ALBUMIN,  in the last 72 hours No results found for this basename: LIPASE, AMYLASE,  in the last 72 hours Cardiac Enzymes No results found for this basename: CKTOTAL, CKMB, CKMBINDEX, TROPONINI,  in the last 72 hours BNP No components found with this basename: POCBNP,  D-Dimer No results found for this basename: DDIMER,  in the last 72 hours Hemoglobin A1C No results found for this basename: HGBA1C,  in the last 72 hours Fasting Lipid Panel No results found for this basename: CHOL, HDL, LDLCALC, TRIG, CHOLHDL, LDLDIRECT,  in the last 72 hours Thyroid Function Tests No results found for this basename: TSH, T4TOTAL, FREET3, T3FREE, THYROIDAB,  in the last 72 hours  TELE  Normal sinus rhythm  ECG    Radiology/Studies  Ct Angio Chest W/cm &/or  Wo Cm  03/31/2014   CLINICAL DATA:  Shortness of breath  EXAM: CT ANGIOGRAPHY CHEST WITH CONTRAST  TECHNIQUE: Multidetector CT imaging of the chest was performed using the standard protocol during bolus administration of intravenous contrast. Multiplanar CT image reconstructions and MIPs were obtained to evaluate the vascular anatomy.  CONTRAST:  153mL OMNIPAQUE IOHEXOL 350 MG/ML SOLN  COMPARISON:  None.  FINDINGS: The lungs are well aerated bilaterally without focal infiltrate or sizable effusion. Mild lingular atelectasis is noted. No pneumothorax or parenchymal nodule is seen. Diffuse emphysematous changes are noted.  The thoracic aorta and pulmonary artery are well visualized. No definitive pulmonary emboli are identified. No aneurysmal dilatation of the  aorta is seen. No significant lymphadenopathy is noted. Coronary calcifications are noted.  Scanning into the upper abdomen reveals a 4.0 x 3.5 cm left adrenal mass. It has some fatty components within a may simply represent a myelolipoma. No other focal abnormality in the upper abdomen is seen. No acute bony abnormality is noted. Thoracic degenerative changes are seen.  Review of the MIP images confirms the above findings.  IMPRESSION: No evidence of pulmonary emboli.  Emphysematous changes.  Left adrenal mass as described. 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 Short-term followup versus is nonemergent MR evaluation can be pursued.  Although not mentioned in the body of the report, a small hiatal hernia is noted.   Electronically Signed   By: [name] M.D.   On: 03/31/2014 19:14   Dg Chest Portable 1 View  03/31/2014   CLINICAL DATA:  Port a short of breath  EXAM: PORTABLE CHEST - 1 VIEW  COMPARISON:  09/06/2012  FINDINGS: Normal cardiac silhouette. There is central venous pulmonary congestion similar to comparison exam. There is mild interstitial edema pattern which is also not changed. No overt pulmonary edema. No pleural fluid. No pneumothorax.  IMPRESSION: Central venous congestion and mild interstitial edema similar to comparison exam.   Electronically Signed   By: [name] M.D.   On: 03/31/2014 15:49    ASSESSMENT AND PLAN 1 atrial flutter-maintaining normal sinus rhythm after cardioversion.  Plan to send home on diltiazem, Xarelto, and amiodarone.  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 Improved  4 left adrenal mass-Plan MRI as an outpatient.  5 acute on chronic systolic heart failure with ejection fraction 40-45%.  Continue low-dose diltiazem and resume ACE inhibitor.  Avoid beta blocker in the face of her severe COPD.  She will be going home on a reduced dose of Lasix 20 mg daily.  Cardiology followup with Dr. Marlou Porch and medical follow up with  Dr. Deforest Hoyles regarding left adrenal mass.   Signed, Darlin Coco MD

## 2014-04-04 NOTE — Discharge Summary (Signed)
Physician Discharge Summary     Cardiologist:  Marlou Porch PCP:  [name], MD  Patient ID: Caitlin Hampton MRN: [id number] DOB/AGE: 30-May-1946 68 y.o.  Admit date: 03/31/2014 Discharge date: 04/04/2014  Admission Diagnoses:    Atrial flutter with rapid ventricular response  Discharge Diagnoses:  Active Problems:   Atrial flutter with rapid ventricular response, unknown duration   Tobacco abuse   Acute diastolic CHF (congestive heart failure)   Hypertension   COPD (chronic obstructive pulmonary disease)   Acute renal failure (ARF)   Discharged Condition: stable  Hospital Course:   This is a 68 y.o. female with a past medical history significant for COPD on home O2, atrial flutter currently on anticoagulation with Xarelto. She has had gradually worsening dyspnea for two months. She has a chronic cough, recently productive of mostly clear sputum and has occasional wheezing. She denies fever, chills or chest pain (either anginal or pleuritic). In her PCP's office today she was found to have a persistent regular tachycardia at 145-150 bpm.  She was hospitalized on 12/13 for rapid ventricular response atrial fibrillation. Echocardiogram was done on September 07 2012 which showed normal ejection fraction of 65%, moderate LVH. Mild mitral regurgitation, trivial pericardial effusion.  She has diastolic heart failure and has taken furosemide very infrequently due to urinary frequency/incomplete bladder emptying.  She was admitted and started on IV amiodarone which was subsequently changed to PO.  Cardizem CD continued.  Lisinopril was decreased to 20mg  as was her lasix.  Net fluids were -0.9L.  She had a small abrasion on there right lower extremity which was followed by wound care.  She underwent successful DCCV on 04/02/14.  Echocardiogram revealed an EF of 40-45% with diffuse hypokinesis.   LE venous dopplers were negative for DVT.  Femoral veins were not evaluated due to intolerance of compression.   She had a CT angio of the chest which was negative for PE but revealed a 4.0 x 3.5 cm left adrenal mass.  She will follow up with Dr. Lysle Rubens.  The patient was seen by Dr. Mare Ferrari who felt she was stable for DC home.      Consults:  Wound Care  Significant Diagnostic Studies:  CT ANGIOGRAPHY CHEST WITH CONTRAST  TECHNIQUE:  Multidetector CT imaging of the chest was performed using the  standard protocol during bolus administration of intravenous  contrast. Multiplanar CT image reconstructions and MIPs were  obtained to evaluate the vascular anatomy.  CONTRAST: 151mL OMNIPAQUE IOHEXOL 350 MG/ML SOLN  COMPARISON: None.  FINDINGS:  The lungs are well aerated bilaterally without focal infiltrate or  sizable effusion. Mild lingular atelectasis is noted. No  pneumothorax or parenchymal nodule is seen. Diffuse emphysematous  changes are noted.  The thoracic aorta and pulmonary artery are well visualized. No  definitive pulmonary emboli are identified. No aneurysmal dilatation  of the aorta is seen. No significant lymphadenopathy is noted.  Coronary calcifications are noted.  Scanning into the upper abdomen reveals a 4.0 x 3.5 cm left adrenal  mass. 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 No other focal abnormality in the upper abdomen is  seen. No acute bony abnormality is noted. Thoracic degenerative  changes are seen.   Review of the MIP images confirms the above findings.  IMPRESSION:  No evidence of pulmonary emboli.  Emphysematous changes.  Left adrenal mass as described. 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  Short-term followup versus is nonemergent MR evaluation can be  pursued.  Although not mentioned in the body of the report, a small hiatal  hernia is noted.   Echocardiogram Study Conclusions  - Left ventricle: The cavity size was normal. Wall thickness was increased in a pattern of mild LVH. Systolic function was mildly to moderately  reduced. The estimated ejection fraction was in the range of 40% to 45%. Diffuse hypokinesis. The study is not technically sufficient to allow evaluation of LV diastolic function. - Mitral valve: There was mild regurgitation. - Pericardium, extracardiac: A trivial pericardial effusion was identified.   Treatments: See above  Discharge Exam: Blood pressure 128/96, pulse 74, temperature 97.4 F (36.3 C), temperature source Oral, resp. rate 18, height 5\' 4"  (1.626 m), weight 243 lb 12.8 oz (110.587 kg), SpO2 100.00%.   Disposition: 01-Home or Self Care      Discharge Instructions   Diet - low sodium heart healthy    Complete by:  As directed      Increase activity slowly    Complete by:  As directed             Medication List         acetaminophen 500 MG tablet  Commonly known as:  TYLENOL  Take 1,000 mg by mouth every 6 (six) hours as needed for mild pain.     amiodarone 400 MG tablet  Commonly known as:  PACERONE  Take 1 tablet (400 mg total) by mouth daily.     diltiazem 120 MG 24 hr capsule  Commonly known as:  DILACOR XR  Take 120 mg by mouth 2 (two) times daily.     furosemide 20 MG tablet  Commonly known as:  LASIX  Take 1 tablet (20 mg total) by mouth daily.     levothyroxine 100 MCG tablet  Commonly known as:  SYNTHROID, LEVOTHROID  Take 100 mcg by mouth daily.     lisinopril 20 MG tablet  Commonly known as:  PRINIVIL,ZESTRIL  Take 1 tablet (20 mg total) by mouth daily.     potassium chloride SA 20 MEQ tablet  Commonly known as:  K-DUR,KLOR-CON  Take 10 mEq by mouth daily.     rivaroxaban 20 MG Tabs tablet  Commonly known as:  XARELTO  Take 1 tablet (20 mg total) by mouth daily with breakfast.       Follow-up Information   Follow up with Richardson Dopp, PA-C On 04/25/2014. (10:30AM)    Specialty:  Physician Assistant   Contact information:   [address] [place] [address] [phone]       Follow up with  [name], MD. (The office will call you with the follow up appt date and time.)    Specialty:  Internal Medicine   Contact information:   [address], [address] [place] [address] [phone]      Greater than 30 minutes was spent completing the patient's discharge.   SignedTarri Fuller , [place]  04/04/2014, 10:20 AM

## 2014-04-25 ENCOUNTER — Encounter: Payer: Self-pay | Admitting: Physician Assistant

## 2014-04-25 ENCOUNTER — Ambulatory Visit (INDEPENDENT_AMBULATORY_CARE_PROVIDER_SITE_OTHER): Payer: Medicare Other | Admitting: Physician Assistant

## 2014-04-25 VITALS — BP 182/77 | HR 73 | Ht 64.0 in | Wt 244.0 lb

## 2014-04-25 DIAGNOSIS — I4892 Unspecified atrial flutter: Secondary | ICD-10-CM

## 2014-04-25 DIAGNOSIS — I10 Essential (primary) hypertension: Secondary | ICD-10-CM

## 2014-04-25 DIAGNOSIS — E278 Other specified disorders of adrenal gland: Secondary | ICD-10-CM

## 2014-04-25 DIAGNOSIS — R0602 Shortness of breath: Secondary | ICD-10-CM

## 2014-04-25 DIAGNOSIS — E279 Disorder of adrenal gland, unspecified: Secondary | ICD-10-CM

## 2014-04-25 DIAGNOSIS — I429 Cardiomyopathy, unspecified: Secondary | ICD-10-CM

## 2014-04-25 DIAGNOSIS — I428 Other cardiomyopathies: Secondary | ICD-10-CM

## 2014-04-25 DIAGNOSIS — I5022 Chronic systolic (congestive) heart failure: Secondary | ICD-10-CM

## 2014-04-25 DIAGNOSIS — J42 Unspecified chronic bronchitis: Secondary | ICD-10-CM

## 2014-04-25 LAB — BASIC METABOLIC PANEL
BUN: 10 mg/dL (ref 6–23)
CALCIUM: 9.3 mg/dL (ref 8.4–10.5)
CO2: 33 mEq/L — ABNORMAL HIGH (ref 19–32)
Chloride: 100 mEq/L (ref 96–112)
Creatinine, Ser: 0.8 mg/dL (ref 0.4–1.2)
GFR: 80.31 mL/min (ref >60.00)
Glucose, Bld: 77 mg/dL (ref 70–99)
POTASSIUM: 4.2 meq/L (ref 3.5–5.1)
SODIUM: 137 meq/L (ref 135–145)

## 2014-04-25 LAB — BRAIN NATRIURETIC PEPTIDE: PRO B NATRI PEPTIDE: 183 pg/mL — AB (ref 0.0–100.0)

## 2014-04-25 MED ORDER — AMIODARONE HCL 200 MG PO TABS: 200.0000 mg | ORAL_TABLET | Freq: Every day | ORAL | Status: DC

## 2014-04-25 MED ORDER — LISINOPRIL 40 MG PO TABS: 40.0000 mg | ORAL_TABLET | Freq: Every day | ORAL | Status: DC

## 2014-04-25 NOTE — Patient Instructions (Addendum)
MAKE SURE TO TAKE LASIX AND POTASSIUM ON A DAILY BASIS  DECREASE AMIODARONE TO 200 MG DAILY; NEW RX SENT IN WITH NEW DIRECTIONS  INCREASE LISINOPRIL TO 40 MG DAILY; NEW RX SENT IN TODAY  LAB WORK TODAY; BMET, BNP  YOU HAVE BEEN GIVEN AN RX FOR YOU TO HAVE LAB WORK NEXT WEEK AT YOUR PRIMARY CARE OFFICE WITH THE RESULTS TO BE FAXED TO [place], [place]  Your physician has requested that you have an echocardiogram TO BE DONE IN 2 WEEKS; DX CARDIOMYOPATHYt uses sound waves to create images of your heart. It provides your doctor with information about the size and shape of your heart and how well your heart's chambers and valves are working. This procedure takes approximately one hour. There are no restrictions for this procedure.  Your physician recommends that you schedule a follow-up appointment in: [place]

## 2014-04-25 NOTE — Progress Notes (Signed)
Cardiology Office Note    Date:  04/25/2014   ID:  Caitlin, Hampton 10-07-1945, MRN [id number]  PCP:  Caitlin Low, MD  Cardiologist:  Dr. Candee Hampton      History of Present Illness: Caitlin Hampton is a 68 y.o. female with a hx of COPD on home O2, PAFib/Flutter, HTN, hypothyroidism, diastolic CHF.  Admitted 7/9-7/13 with AFlutter with RVR with associated volume excess.  She was started on Amiodarone.  Underwent DCCV with restoration of NSR.  She was gently diuresed.  Echo demonstrated worsening LVF with EF 40-45% (previous EF 65%).  There was incidental finding of L adrenal mass on Chest CT done to rule out PE.  F/u MRI recommended.  She returns for follow up.  Sister here with her and notes that her breathing is still poor.  She has baseline dyspnea.  However, she still feels it is worse.  She has not been taking her Lasix consistently.  She denies orthopnea, PND.  Denies LE edema.  Denies chest pain.  Denies syncope.  She has a NP cough.    Studies:  - Echo (04/01/14):  Mild LVH, EF 40-45%, diff HK, mild MR, trivial eff    Recent Labs/Images: 03/31/2014: ALT 14  04/01/2014: Pro B Natriuretic peptide (BNP) 993.8*; TSH 1.220  04/02/2014: Hemoglobin 11.9*  04/04/2014: Creatinine 1.05; Potassium 4.7   Ct Angio Chest W/cm &/or Wo Cm  03/31/2014       IMPRESSION: No evidence of pulmonary emboli.  Emphysematous changes.  Left adrenal mass as described. 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 Short-term followup versus is nonemergent MR evaluation can be pursued.  Although not mentioned in the body of the report, a small hiatal hernia is noted.   Electronically Signed   By: Caitlin Hampton M.D.   On: 03/31/2014 19:14   Dg Chest Portable 1 View  03/31/2014   IMPRESSION: Central venous congestion and mild interstitial edema similar to comparison exam.   Electronically Signed   By: Caitlin Hampton M.D.   On: 03/31/2014 15:49     Wt Readings from Last 3 Encounters:  04/25/14 244 lb (110.678 kg)  04/04/14  243 lb 12.8 oz (110.587 kg)  04/04/14 243 lb 12.8 oz (110.587 kg)     Past Medical History  Diagnosis Date  . Hypertension   . Thyroid disease   . COPD (chronic obstructive pulmonary disease)   . Anxiety   . H/O hiatal hernia   . Hypothyroidism   . Shortness of breath   . Atrial flutter 09/07/2012    , RVR/ hospital 12/13- rate control and Echo normal- on xarelto, when spontaneously converted she had bradycardia on Metoprolol and Digoxin  . GERD (gastroesophageal reflux disease)   . Rhinitis   . Parotitis     , on CT of face- 12/13  . CHF (congestive heart failure)     Current Outpatient Prescriptions  Medication Sig Dispense Refill  . acetaminophen (TYLENOL) 500 MG tablet Take 1,000 mg by mouth every 6 (six) hours as needed for mild pain.      Marland Kitchen amiodarone (PACERONE) 400 MG tablet Take 1 tablet (400 mg total) by mouth daily.  30 tablet  5  . diltiazem (CARDIZEM CD) 120 MG 24 hr capsule       . diltiazem (DILACOR XR) 120 MG 24 hr capsule Take 120 mg by mouth 2 (two) times daily.      . furosemide (LASIX) 20 MG tablet Take 1 tablet (20 mg total) by  mouth daily.  30 tablet  5  . levothyroxine (SYNTHROID, LEVOTHROID) 100 MCG tablet Take 100 mcg by mouth daily.      Marland Kitchen lisinopril (PRINIVIL,ZESTRIL) 20 MG tablet Take 1 tablet (20 mg total) by mouth daily.  30 tablet  5  . potassium chloride SA (K-DUR,KLOR-CON) 20 MEQ tablet Take 10 mEq by mouth daily.      . Rivaroxaban (XARELTO) 20 MG TABS Take 1 tablet (20 mg total) by mouth daily with breakfast.  30 tablet  11   No current facility-administered medications for this visit.     Allergies:   Review of patient's allergies indicates no known allergies.   Social History:  The patient  reports that she quit smoking about 21 months ago. Her smoking use included Cigarettes. She smoked 1.50 packs per day. She does not have any smokeless tobacco history on file. She reports that she does not drink alcohol or use illicit drugs.   Family  History:  The patient's family history includes CAD in her mother; Dementia in her father; Prostate cancer in her father; Stroke in her mother.   ROS:  Please see the history of present illness.   No bleeding, vomiting, diarrhea.    All other systems reviewed and negative.   PHYSICAL EXAM: VS:  BP 182/77  Pulse 73  Ht 5\' 4"  (1.626 m)  Wt 244 lb (110.678 kg)  BMI 41.86 kg/m2 Well nourished, well developed, in no acute distress HEENT: normal Neck: no JVDat 90 Cardiac:  normal S1, S2; RRR; no murmur Lungs:  Decreased breath sounds bilaterally, no wheezing, rhonchi or rales Abd: soft, nontender, no hepatomegaly Ext: 1+ bilateral LE edema Skin: warm and dry Neuro:  CNs 2-12 intact, no focal abnormalities noted  EKG:  Increased artifact, NSR, normal axis, PAC     ASSESSMENT AND PLAN:  Atrial flutter, unspecified:  Maintaining NSR.  Continue Xarelto.  Continue current dose of Diltiazem.  Will decrease Amiodarone to 200 QD.  Chronic systolic heart failure:  She remains short of breath.  She has not been taking Lasix regularly.  She has some evidence of volume excess.  Question if dyspnea multifactorial and related to COPD, CHF, recent admission with deconditioning.  I will check a BMET and BNP today.  I have asked her to take her Lasix and K+ daily.    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  Arrange follow up echo.  If EF remains down, with increased DOE, consider Myoview to rule out ischemia.  Continue ACEI.  Not a good candidate for beta blocker with hx of COPD.  If EF remains down, will need to consider stopping CCB and changing to cardioselective beta blocker.    Essential hypertension:  Uncontrolled.  Lisinopril reduced in the hospital.  Increase Lisinopril to 40 QD. Check BMET in 1 week.   Chronic bronchitis, unspecified chronic bronchitis type:  She remains on O2.  Adrenal mass, left:  F/u with PCP next week.  Radiologist recommended f/u MRI.    Disposition:  F/u with Dr. Candee Hampton in 1 month.    Signed, Caitlin Hampton, MHS 04/25/2014 10:41 AM    [facility] [place], [place], [place]  [address] Phone: [phone]; Fax: [phone]

## 2014-04-26 ENCOUNTER — Telehealth: Payer: Self-pay | Admitting: *Deleted

## 2014-04-26 NOTE — Telephone Encounter (Signed)
Pt notified about lab results with verbal understanding. I did remind pt to make sure to take the lasix and K+ on a daily basis as was discussed at [facility] yesterday with Brynda Rim. PA, Pt said ok and thank you.

## 2014-05-04 ENCOUNTER — Other Ambulatory Visit: Payer: Self-pay | Admitting: Cardiology

## 2014-05-04 ENCOUNTER — Other Ambulatory Visit: Payer: Self-pay | Admitting: Physician Assistant

## 2014-05-05 ENCOUNTER — Ambulatory Visit (HOSPITAL_COMMUNITY): Payer: Medicare Other | Attending: Internal Medicine | Admitting: Radiology

## 2014-05-05 ENCOUNTER — Telehealth: Payer: Self-pay | Admitting: Cardiology

## 2014-05-05 DIAGNOSIS — I509 Heart failure, unspecified: Secondary | ICD-10-CM | POA: Diagnosis not present

## 2014-05-05 DIAGNOSIS — I4891 Unspecified atrial fibrillation: Secondary | ICD-10-CM | POA: Diagnosis not present

## 2014-05-05 DIAGNOSIS — I059 Rheumatic mitral valve disease, unspecified: Secondary | ICD-10-CM | POA: Insufficient documentation

## 2014-05-05 DIAGNOSIS — I079 Rheumatic tricuspid valve disease, unspecified: Secondary | ICD-10-CM | POA: Diagnosis not present

## 2014-05-05 DIAGNOSIS — I428 Other cardiomyopathies: Secondary | ICD-10-CM

## 2014-05-05 DIAGNOSIS — I10 Essential (primary) hypertension: Secondary | ICD-10-CM | POA: Insufficient documentation

## 2014-05-05 DIAGNOSIS — Z87891 Personal history of nicotine dependence: Secondary | ICD-10-CM | POA: Insufficient documentation

## 2014-05-05 DIAGNOSIS — K449 Diaphragmatic hernia without obstruction or gangrene: Secondary | ICD-10-CM | POA: Diagnosis not present

## 2014-05-05 DIAGNOSIS — J449 Chronic obstructive pulmonary disease, unspecified: Secondary | ICD-10-CM | POA: Diagnosis not present

## 2014-05-05 DIAGNOSIS — E039 Hypothyroidism, unspecified: Secondary | ICD-10-CM | POA: Diagnosis not present

## 2014-05-05 DIAGNOSIS — R0602 Shortness of breath: Secondary | ICD-10-CM | POA: Diagnosis not present

## 2014-05-05 DIAGNOSIS — I429 Cardiomyopathy, unspecified: Secondary | ICD-10-CM

## 2014-05-05 DIAGNOSIS — J4489 Other specified chronic obstructive pulmonary disease: Secondary | ICD-10-CM | POA: Insufficient documentation

## 2014-05-05 NOTE — Progress Notes (Signed)
Echocardiogram performed.  

## 2014-05-05 NOTE — Telephone Encounter (Signed)
Should this be 10 or 93meq qd? Please advise. Thanks, MI

## 2014-05-05 NOTE — Telephone Encounter (Signed)
I am not able to tell her called the pt and the message didn't state who was calling or what it was about.   She is aware I will call with her results when they are available

## 2014-05-05 NOTE — Telephone Encounter (Signed)
Unable to tell who called the pt.  She did have a 2 D Echo today however I do not have the results as of yet.

## 2014-05-05 NOTE — Telephone Encounter (Signed)
lmtrc

## 2014-05-05 NOTE — Telephone Encounter (Signed)
New message ° ° ° ° ° °Returning a nurses call °

## 2014-05-06 ENCOUNTER — Encounter: Payer: Self-pay | Admitting: Physician Assistant

## 2014-05-06 ENCOUNTER — Telehealth: Payer: Self-pay | Admitting: *Deleted

## 2014-05-06 NOTE — Telephone Encounter (Signed)
pt notified about echo results with verbal understanding 

## 2014-05-09 ENCOUNTER — Other Ambulatory Visit: Payer: Self-pay | Admitting: Cardiology

## 2014-05-09 NOTE — Telephone Encounter (Signed)
Spoke with pt and confirmed pt is taking Klor-Con 20 MEQ 1/2 tablet daily, which is what was on med list at last OV. Pt feels that she has been taking this dosage for quite sometime.

## 2014-06-10 ENCOUNTER — Encounter: Payer: Self-pay | Admitting: Cardiology

## 2014-06-10 ENCOUNTER — Ambulatory Visit (INDEPENDENT_AMBULATORY_CARE_PROVIDER_SITE_OTHER): Payer: Medicare Other | Admitting: Cardiology

## 2014-06-10 VITALS — BP 124/80 | HR 110 | Ht 64.0 in | Wt 244.0 lb

## 2014-06-10 DIAGNOSIS — I4892 Unspecified atrial flutter: Secondary | ICD-10-CM

## 2014-06-10 MED ORDER — AMIODARONE HCL 400 MG PO TABS: 400.0000 mg | ORAL_TABLET | Freq: Every day | ORAL | Status: DC

## 2014-06-10 NOTE — Progress Notes (Signed)
[facility]. [address] [place], [place]  [address] Phone: [phone] Fax:  [phone]  Date:  06/10/2014   ID:  Adaria, Hole June 05, 1946, MRN [id number]  PCP:  [name], MD   History of Present Illness: Caitlin Hampton is a 68 y.o. female COPD on home O2, atrial flutter currently on anticoagulation with Xarelto.  She was hospitalized on 12/13 for rapid ventricular response atrial fibrillation. Relatively asymptomatic.    At discharge, her heart rate was 74 with atrial flutter. Overall she was asymptomatic with her atrial flutter. She has baseline dyspnea which he attributes to her smoking/COPD.  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 Shortness of breath with activity. She has had some weight gain but she admits to dietary indiscretion.  Wears oxygen at baseline.  8/15-amiodarone was decreased from 400 down to 200. Following this appointment, she was once again in atrial flutter. I decided to increase her amiodarone back to 400.  Her ejection fraction at one point was 40-45% to be tachycardia induced. Currently she is normal EF.   Wt Readings from Last 3 Encounters:  06/10/14 244 lb (110.678 kg)  04/25/14 244 lb (110.678 kg)  04/04/14 243 lb 12.8 oz (110.587 kg)     Past Medical History  Diagnosis Date  . Hypertension   . Thyroid disease   . COPD (chronic obstructive pulmonary disease)   . Anxiety   . H/O hiatal hernia   . Hypothyroidism   . Shortness of breath   . Atrial flutter 09/07/2012    , RVR/ hospital 12/13- rate control and Echo normal- on xarelto, when spontaneously converted she had bradycardia on Metoprolol and Digoxin  . GERD (gastroesophageal reflux disease)   . Rhinitis   . Parotitis     , on CT of face- 12/13  . CHF (congestive heart failure)   . Hx of echocardiogram     Echo (8/15):  EF 60-65%    Past Surgical History  Procedure Laterality Date  . Tonsillectomy    . Breast surgery      benign R  brest CA  . Cardioversion N/A 04/02/2014    Procedure: CARDIOVERSION;  Surgeon: Josue Hector, MD;  Location: [facility];  Service: Cardiovascular;  Laterality: N/A;    Current Outpatient Prescriptions  Medication Sig Dispense Refill  . acetaminophen (TYLENOL) 500 MG tablet Take 1,000 mg by mouth every 6 (six) hours as needed for mild pain.      Marland Kitchen amiodarone (PACERONE) 200 MG tablet Take 1 tablet (200 mg total) by mouth daily.  30 tablet  11  . atorvastatin (LIPITOR) 10 MG tablet       . diltiazem (DILACOR XR) 120 MG 24 hr capsule Take 120 mg by mouth 2 (two) times daily.      . furosemide (LASIX) 20 MG tablet Take 1 tablet (20 mg total) by mouth daily.  30 tablet  5  . levothyroxine (SYNTHROID, LEVOTHROID) 100 MCG tablet Take 100 mcg by mouth daily.      Marland Kitchen lisinopril (PRINIVIL,ZESTRIL) 40 MG tablet Take 1 tablet (40 mg total) by mouth daily.  30 tablet  11  . potassium chloride SA (K-DUR,KLOR-CON) 20 MEQ tablet Take 0.5 tablets (10 mEq total) by mouth daily.  15 tablet  6  . Rivaroxaban (XARELTO) 20 MG TABS Take 1 tablet (20 mg total) by mouth daily with breakfast.  30 tablet  11   No current facility-administered medications for  this visit.    Allergies:   No Known Allergies  Social History:  The patient  reports that she quit smoking about 22 months ago. Her smoking use included Cigarettes. She smoked 1.50 packs per day. She does not have any smokeless tobacco history on file. She reports that she does not drink alcohol or use illicit drugs.   ROS:  Please see the history of present illness.   Denies any breathing issues, syncope, orthopnea, PND    PHYSICAL EXAM: VS:  BP 124/80  Pulse 110  Ht 5\' 4"  (1.626 m)  Wt 244 lb (110.678 kg)  BMI 41.86 kg/m2 Well nourished, well developed, in no acute distressWheelchair HEENT: normalHome oxygen. Neck: no JVD Cardiac:  Mildly tachycardic, irregular; no murmur Lungs:  clear to auscultation bilaterally, no wheezing, rhonchi or rales Abd: soft,  nontender, no hepatomegaly obesity Ext: no edema, bruising on hand. Skin: warm and dry Neuro: no focal abnormalities noted  EKG:  None today     ASSESSMENT AND PLAN:  1. Atrial flutter-paroxysmal,  She is back in atrial flutter after decreasing her amiodarone from 400 down to 200. I will increase her back to 400 once a day. She is currently asymptomatic. Her ejection fraction is normal from echocardiogram on 05/05/14. 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 We will try our  best to maintain normal rhythm. Continue with anticoagulation given her risk score of at least 2. 2. Obesity-continue to encourage weight loss. 3. COPD/hypoxia-home oxygen, likely driving a lot of her dyspnea as well. 4. Diastolic heart failure-currently compensated. Ankles, hands no edema. 5. 2 month follow up.  Signed, [name], MD [facility]  06/10/2014 5:09 PM

## 2014-06-10 NOTE — Patient Instructions (Signed)
Please increase Amiodarone to 400 mg a day. Continue all other medications as listed.  Follow up in 2 months with Dr Marlou Porch.

## 2014-08-09 ENCOUNTER — Ambulatory Visit (INDEPENDENT_AMBULATORY_CARE_PROVIDER_SITE_OTHER): Payer: Medicare Other | Admitting: Cardiology

## 2014-08-09 ENCOUNTER — Encounter: Payer: Self-pay | Admitting: *Deleted

## 2014-08-09 ENCOUNTER — Encounter: Payer: Self-pay | Admitting: Cardiology

## 2014-08-09 VITALS — BP 130/90 | HR 114 | Ht 64.0 in | Wt 250.0 lb

## 2014-08-09 DIAGNOSIS — E669 Obesity, unspecified: Secondary | ICD-10-CM

## 2014-08-09 DIAGNOSIS — J438 Other emphysema: Secondary | ICD-10-CM

## 2014-08-09 DIAGNOSIS — I4892 Unspecified atrial flutter: Secondary | ICD-10-CM

## 2014-08-09 DIAGNOSIS — Z01810 Encounter for preprocedural cardiovascular examination: Secondary | ICD-10-CM

## 2014-08-09 LAB — CBC
HCT: 39.9 % (ref 36.0–46.0)
Hemoglobin: 13 g/dL (ref 12.0–15.0)
MCHC: 32.7 g/dL (ref 30.0–36.0)
MCV: 94.1 fl (ref 78.0–100.0)
Platelets: 227 10*3/uL (ref 150.0–400.0)
RBC: 4.24 Mil/uL (ref 3.87–5.11)
RDW: 14.8 % (ref 11.5–15.5)
WBC: 10.3 10*3/uL (ref 4.0–10.5)

## 2014-08-09 NOTE — Patient Instructions (Addendum)
The current medical regimen is effective;  continue present plan and medications.  Please have blood work today. (CBC,BMP)  Your physician has requested that you have a Cardioversion.  Electrical Cardioversion uses a jolt of electricity to your heart either through paddles or wired patches attached to your chest. This is a controlled, usually prescheduled, procedure. This procedure is done at the hospital and you are not awake during the procedure. You usually go home the day of the procedure. Please see the instruction sheet given to you today for more information.  Follow up with Dr Marlou Porch in 2 months.

## 2014-08-09 NOTE — Progress Notes (Signed)
[facility]. [address] [place],   [address] Phone: [phone] Fax:  [phone]  Date:  08/09/2014   ID:  Caitlin, Hampton 04-Aug-1946, MRN [id number]  PCP:  [name], MD   History of Present Illness: Caitlin Hampton is a 68 y.o. female COPD on home O2, atrial flutter currently on anticoagulation with Xarelto.  She was hospitalized on 12/13 for rapid ventricular response atrial fibrillation. Relatively asymptomatic.    At discharge, her heart rate was 74 with atrial flutter. Overall she was asymptomatic with her atrial flutter. She has baseline dyspnea which he attributes to her smoking/COPD.  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 Shortness of breath with activity. She has had some weight gain but she admits to dietary indiscretion.  Wears oxygen at baseline.  8/15-amiodarone was decreased from 400 down to 200. Following this appointment, she was once again in atrial flutter. I decided to increase her amiodarone back to 400.  Her ejection fraction at one point was 40-45% to be tachycardia induced. Currently she is normal EF.  08/09/14-she continues to be in atrial flutter despite increasing her amiodarone at prior visit. We discussed cardioversion.   Wt Readings from Last 3 Encounters:  08/09/14 250 lb (113.399 kg)  06/10/14 244 lb (110.678 kg)  04/25/14 244 lb (110.678 kg)     Past Medical History  Diagnosis Date  . Hypertension   . Thyroid disease   . COPD (chronic obstructive pulmonary disease)   . Anxiety   . H/O hiatal hernia   . Hypothyroidism   . Shortness of breath   . Atrial flutter 09/07/2012    , RVR/ hospital 12/13- rate control and Echo normal- on xarelto, when spontaneously converted she had bradycardia on Metoprolol and Digoxin  . GERD (gastroesophageal reflux disease)   . Rhinitis   . Parotitis     , on CT of face- 12/13  . CHF (congestive heart failure)   . Hx of echocardiogram     Echo (8/15):   EF 60-65%    Past Surgical History  Procedure Laterality Date  . Tonsillectomy    . Breast surgery      benign R brest CA  . Cardioversion N/A 04/02/2014    Procedure: CARDIOVERSION;  Surgeon: Josue Hector, MD;  Location: [facility];  Service: Cardiovascular;  Laterality: N/A;    Current Outpatient Prescriptions  Medication Sig Dispense Refill  . acetaminophen (TYLENOL) 500 MG tablet Take 1,000 mg by mouth every 6 (six) hours as needed for mild pain.    Marland Kitchen amiodarone (PACERONE) 400 MG tablet Take 1 tablet (400 mg total) by mouth daily. 30 tablet 11  . atorvastatin (LIPITOR) 10 MG tablet     . diltiazem (DILACOR XR) 120 MG 24 hr capsule Take 120 mg by mouth 2 (two) times daily.    . furosemide (LASIX) 20 MG tablet Take 1 tablet (20 mg total) by mouth daily. 30 tablet 5  . levothyroxine (SYNTHROID, LEVOTHROID) 100 MCG tablet Take 100 mcg by mouth daily.    Marland Kitchen lisinopril (PRINIVIL,ZESTRIL) 40 MG tablet Take 1 tablet (40 mg total) by mouth daily. 30 tablet 11  . potassium chloride SA (K-DUR,KLOR-CON) 20 MEQ tablet Take 0.5 tablets (10 mEq total) by mouth daily. 15 tablet 6  . Rivaroxaban (XARELTO) 20 MG TABS Take 1 tablet (20 mg total) by mouth daily with breakfast. 30 tablet 11   No current facility-administered medications for this visit.  Allergies:   No Known Allergies  Social History:  The patient  reports that she quit smoking about 2 years ago. Her smoking use included Cigarettes. She smoked 1.50 packs per day. She does not have any smokeless tobacco history on file. She reports that she does not drink alcohol or use illicit drugs.   ROS:  Please see the history of present illness.   Denies any new breathing issues, syncope, orthopnea, PND    PHYSICAL EXAM: VS:  BP 130/90 mmHg  Pulse 114  Ht 5\' 4"  (1.626 m)  Wt 250 lb (113.399 kg)  BMI 42.89 kg/m2 Well nourished, well developed, in no acute distressWheelchair HEENT: normalHome oxygen. Neck: no JVD Cardiac:  Mildly  tachycardic, irregular; no murmur Lungs:  clear to auscultation bilaterally, no wheezing, rhonchi or rales Abd: soft, nontender, no hepatomegaly obesity Ext: no edema, bruising on hand. Skin: warm and dry Neuro: no focal abnormalities noted  EKG:  08/09/14-atrial flutter with 2-1 conduction heart rate 114 bpm     ASSESSMENT AND PLAN:  1. Atrial flutter-paroxysmal,  She is back in atrial flutter after decreasing her amiodarone from 400 down to 200. She is back to 400 once a day. She is currently short of breath with minimal activity but this is fairly normal for her. She is on home oxygen. Her ejection fraction is normal from echocardiogram on 05/05/14. 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 We will try our  best to maintain normal rhythm. Continue with anticoagulation given her risk score of at least 2. We will go ahead and perform cardioversion. Risks and benefits of procedure were discussed. Pulmonary risks discussed. 2. Obesity-continue to encourage weight loss. 3. COPD/hypoxia-home oxygen, likely driving her dyspnea as well. 4. Diastolic heart failure-currently compensated. Ankles, hands no edema. 5. Peripheral neuropathy-left hand. Could be side effect of amiodarone. Discussed. 6. 2 month follow up.  Signed, [name], MD [facility]  08/09/2014 2:06 PM

## 2014-08-10 ENCOUNTER — Telehealth: Payer: Self-pay | Admitting: Cardiology

## 2014-08-10 LAB — BASIC METABOLIC PANEL
BUN: 21 mg/dL (ref 6–23)
CO2: 34 mEq/L — ABNORMAL HIGH (ref 19–32)
CREATININE: 1.1 mg/dL (ref 0.4–1.2)
Calcium: 9.1 mg/dL (ref 8.4–10.5)
Chloride: 99 mEq/L (ref 96–112)
GFR: 54.07 mL/min — ABNORMAL LOW (ref >60.00)
Glucose, Bld: 93 mg/dL (ref 70–99)
Potassium: 4.7 mEq/L (ref 3.5–5.1)
Sodium: 139 mEq/L (ref 135–145)

## 2014-08-10 NOTE — Telephone Encounter (Signed)
Pt located paperwork and doesn't need it reprinted.  She had no further questions or concerns and thanked me for my time.

## 2014-08-10 NOTE — Telephone Encounter (Signed)
New Message        Pt was seen yesterday by Dr. Marlou Porch and was given forms w/ instructions for pt's upcoming procedure. Pt has misplaced forms and wants to know if she can get them printed out again and when she can pick them up. Please call back and advise.

## 2014-08-17 ENCOUNTER — Ambulatory Visit (HOSPITAL_COMMUNITY): Payer: Medicare Other | Admitting: Anesthesiology

## 2014-08-17 ENCOUNTER — Encounter (HOSPITAL_COMMUNITY)
Admission: RE | Disposition: A | Discharge status: Home / Self Care | Payer: Self-pay | Source: Ambulatory Visit | Attending: Cardiology

## 2014-08-17 ENCOUNTER — Encounter (HOSPITAL_COMMUNITY): Payer: Self-pay | Admitting: Gastroenterology

## 2014-08-17 ENCOUNTER — Ambulatory Visit (HOSPITAL_COMMUNITY)
Admission: RE | Admit: 2014-08-17 | Discharge: 2014-08-17 | Disposition: A | Discharge status: Home / Self Care | Payer: Medicare Other | Source: Ambulatory Visit | Attending: Cardiology | Admitting: Cardiology

## 2014-08-17 DIAGNOSIS — E039 Hypothyroidism, unspecified: Secondary | ICD-10-CM | POA: Diagnosis not present

## 2014-08-17 DIAGNOSIS — J449 Chronic obstructive pulmonary disease, unspecified: Secondary | ICD-10-CM | POA: Insufficient documentation

## 2014-08-17 DIAGNOSIS — Z6841 Body Mass Index (BMI) 40.0 and over, adult: Secondary | ICD-10-CM | POA: Diagnosis not present

## 2014-08-17 DIAGNOSIS — Z87891 Personal history of nicotine dependence: Secondary | ICD-10-CM | POA: Diagnosis not present

## 2014-08-17 DIAGNOSIS — G629 Polyneuropathy, unspecified: Secondary | ICD-10-CM | POA: Insufficient documentation

## 2014-08-17 DIAGNOSIS — E669 Obesity, unspecified: Secondary | ICD-10-CM | POA: Diagnosis not present

## 2014-08-17 DIAGNOSIS — F17210 Nicotine dependence, cigarettes, uncomplicated: Secondary | ICD-10-CM | POA: Insufficient documentation

## 2014-08-17 DIAGNOSIS — K219 Gastro-esophageal reflux disease without esophagitis: Secondary | ICD-10-CM | POA: Insufficient documentation

## 2014-08-17 DIAGNOSIS — Z9981 Dependence on supplemental oxygen: Secondary | ICD-10-CM | POA: Insufficient documentation

## 2014-08-17 DIAGNOSIS — F419 Anxiety disorder, unspecified: Secondary | ICD-10-CM | POA: Diagnosis not present

## 2014-08-17 DIAGNOSIS — I10 Essential (primary) hypertension: Secondary | ICD-10-CM | POA: Insufficient documentation

## 2014-08-17 DIAGNOSIS — Z7901 Long term (current) use of anticoagulants: Secondary | ICD-10-CM | POA: Insufficient documentation

## 2014-08-17 DIAGNOSIS — I509 Heart failure, unspecified: Secondary | ICD-10-CM | POA: Insufficient documentation

## 2014-08-17 DIAGNOSIS — I4892 Unspecified atrial flutter: Secondary | ICD-10-CM | POA: Diagnosis present

## 2014-08-17 HISTORY — PX: CARDIOVERSION: SHX1299

## 2014-08-17 SURGERY — CARDIOVERSION
Anesthesia: Monitor Anesthesia Care

## 2014-08-17 MED ORDER — LIDOCAINE HCL (CARDIAC) 20 MG/ML IV SOLN
INTRAVENOUS | Status: DC | PRN
  Administered 2014-08-17: 40 mg via INTRAVENOUS

## 2014-08-17 MED ORDER — SODIUM CHLORIDE 0.9 % IV SOLN
INTRAVENOUS | Status: DC
  Administered 2014-08-17: 500 mL via INTRAVENOUS

## 2014-08-17 MED ORDER — PROPOFOL 10 MG/ML IV BOLUS
INTRAVENOUS | Status: DC | PRN
  Administered 2014-08-17: 40 mg via INTRAVENOUS

## 2014-08-17 NOTE — Anesthesia Preprocedure Evaluation (Signed)
Anesthesia Evaluation  Patient identified by MRN, date of birth, ID band Patient awake    Reviewed: Allergy & Precautions, H&P , NPO status , Patient's Chart, lab work & pertinent test results  Airway        Dental   Pulmonary shortness of breath, COPD oxygen dependent,          Cardiovascular hypertension, +CHF + dysrhythmias Atrial Fibrillation  Ef 55-60   Neuro/Psych PSYCHIATRIC DISORDERS Anxiety    GI/Hepatic hiatal hernia, GERD-  ,  Endo/Other  Hypothyroidism   Renal/GU Renal disease     Musculoskeletal   Abdominal   Peds  Hematology   Anesthesia Other Findings   Reproductive/Obstetrics                             Anesthesia Physical Anesthesia Plan  ASA: III  Anesthesia Plan: MAC   Post-op Pain Management:    Induction: Intravenous  Airway Management Planned: Mask  Additional Equipment:   Intra-op Plan:   Post-operative Plan:   Informed Consent: I have reviewed the patients History and Physical, chart, labs and discussed the procedure including the risks, benefits and alternatives for the proposed anesthesia with the patient or authorized representative who has indicated his/her understanding and acceptance.     Plan Discussed with: CRNA and Anesthesiologist  Anesthesia Plan Comments:         Anesthesia Quick Evaluation

## 2014-08-17 NOTE — CV Procedure (Signed)
    Electrical Cardioversion Procedure Note Caitlin Hampton [id number] [birth year]  Procedure: Electrical Cardioversion Indications:  Atrial Flutter  Time Out: Verified patient identification, verified procedure,medications/allergies/relevent history reviewed, required imaging and test results available.  Performed  Procedure Details  The patient was NPO after midnight. Anesthesia was administered at the beside  by Dr.Moser with  propofol.  Cardioversion was performed with synchronized biphasic defibrillation via AP pads with 150 joules.  1 attempt(s) were performed.  The patient converted to normal sinus rhythm. The patient tolerated the procedure well   IMPRESSION:  Successful cardioversion of atrial flutter. HR 62 post.    SKAINS, MARK 08/17/2014, 11:38 AM

## 2014-08-17 NOTE — Interval H&P Note (Signed)
History and Physical Interval Note:  08/17/2014 11:03 AM  Caitlin Hampton  has presented today for surgery, with the diagnosis of aflutter  The various methods of treatment have been discussed with the patient and family. After consideration of risks, benefits and other options for treatment, the patient has consented to  Procedure(s): CARDIOVERSION (N/A) as a surgical intervention .  The patient's history has been reviewed, patient examined, no change in status, stable for surgery.  I have reviewed the patient's chart and labs.  Questions were answered to the patient's satisfaction.     [name]

## 2014-08-17 NOTE — H&P (View-Only) (Signed)
[facility]. [address] [place],   [address] Phone: [phone] Fax:  [phone]  Date:  08/09/2014   ID:  [name], [name] 10-14-1945, MRN [id number]  PCP:  [name], MD   History of Present Illness: Caitlin Hampton is a 68 y.o. female COPD on home O2, atrial flutter currently on anticoagulation with Xarelto.  She was hospitalized on 12/13 for rapid ventricular response atrial fibrillation. Relatively asymptomatic.    At discharge, her heart rate was 74 with atrial flutter. Overall she was asymptomatic with her atrial flutter. She has baseline dyspnea which he attributes to her smoking/COPD.  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 Shortness of breath with activity. She has had some weight gain but she admits to dietary indiscretion.  Wears oxygen at baseline.  8/15-amiodarone was decreased from 400 down to 200. Following this appointment, she was once again in atrial flutter. I decided to increase her amiodarone back to 400.  Her ejection fraction at one point was 40-45% to be tachycardia induced. Currently she is normal EF.  08/09/14-she continues to be in atrial flutter despite increasing her amiodarone at prior visit. We discussed cardioversion.   Wt Readings from Last 3 Encounters:  08/09/14 250 lb (113.399 kg)  06/10/14 244 lb (110.678 kg)  04/25/14 244 lb (110.678 kg)     Past Medical History  Diagnosis Date  . Hypertension   . Thyroid disease   . COPD (chronic obstructive pulmonary disease)   . Anxiety   . H/O hiatal hernia   . Hypothyroidism   . Shortness of breath   . Atrial flutter 09/07/2012    , RVR/ hospital 12/13- rate control and Echo normal- on xarelto, when spontaneously converted she had bradycardia on Metoprolol and Digoxin  . GERD (gastroesophageal reflux disease)   . Rhinitis   . Parotitis     , on CT of face- 12/13  . CHF (congestive heart failure)   . Hx of echocardiogram     Echo (8/15):   EF 60-65%    Past Surgical History  Procedure Laterality Date  . Tonsillectomy    . Breast surgery      benign R brest CA  . Cardioversion N/A 04/02/2014    Procedure: CARDIOVERSION;  Surgeon: Josue Hector, MD;  Location: [facility];  Service: Cardiovascular;  Laterality: N/A;    Current Outpatient Prescriptions  Medication Sig Dispense Refill  . acetaminophen (TYLENOL) 500 MG tablet Take 1,000 mg by mouth every 6 (six) hours as needed for mild pain.    Marland Kitchen amiodarone (PACERONE) 400 MG tablet Take 1 tablet (400 mg total) by mouth daily. 30 tablet 11  . atorvastatin (LIPITOR) 10 MG tablet     . diltiazem (DILACOR XR) 120 MG 24 hr capsule Take 120 mg by mouth 2 (two) times daily.    . furosemide (LASIX) 20 MG tablet Take 1 tablet (20 mg total) by mouth daily. 30 tablet 5  . levothyroxine (SYNTHROID, LEVOTHROID) 100 MCG tablet Take 100 mcg by mouth daily.    Marland Kitchen lisinopril (PRINIVIL,ZESTRIL) 40 MG tablet Take 1 tablet (40 mg total) by mouth daily. 30 tablet 11  . potassium chloride SA (K-DUR,KLOR-CON) 20 MEQ tablet Take 0.5 tablets (10 mEq total) by mouth daily. 15 tablet 6  . Rivaroxaban (XARELTO) 20 MG TABS Take 1 tablet (20 mg total) by mouth daily with breakfast. 30 tablet 11   No current facility-administered medications for this visit.  Allergies:   No Known Allergies  Social History:  The patient  reports that she quit smoking about 2 years ago. Her smoking use included Cigarettes. She smoked 1.50 packs per day. She does not have any smokeless tobacco history on file. She reports that she does not drink alcohol or use illicit drugs.   ROS:  Please see the history of present illness.   Denies any new breathing issues, syncope, orthopnea, PND    PHYSICAL EXAM: VS:  BP 130/90 mmHg  Pulse 114  Ht 5\' 4"  (1.626 m)  Wt 250 lb (113.399 kg)  BMI 42.89 kg/m2 Well nourished, well developed, in no acute distressWheelchair HEENT: normalHome oxygen. Neck: no JVD Cardiac:  Mildly  tachycardic, irregular; no murmur Lungs:  clear to auscultation bilaterally, no wheezing, rhonchi or rales Abd: soft, nontender, no hepatomegaly obesity Ext: no edema, bruising on hand. Skin: warm and dry Neuro: no focal abnormalities noted  EKG:  08/09/14-atrial flutter with 2-1 conduction heart rate 114 bpm     ASSESSMENT AND PLAN:  1. Atrial flutter-paroxysmal,  She is back in atrial flutter after decreasing her amiodarone from 400 down to 200. She is back to 400 once a day. She is currently short of breath with minimal activity but this is fairly normal for her. She is on home oxygen. Her ejection fraction is normal from echocardiogram on 05/05/14. 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 We will try our  best to maintain normal rhythm. Continue with anticoagulation given her risk score of at least 2. We will go ahead and perform cardioversion. Risks and benefits of procedure were discussed. Pulmonary risks discussed. 2. Obesity-continue to encourage weight loss. 3. COPD/hypoxia-home oxygen, likely driving her dyspnea as well. 4. Diastolic heart failure-currently compensated. Ankles, hands no edema. 5. Peripheral neuropathy-left hand. Could be side effect of amiodarone. Discussed. 6. 2 month follow up.  Signed, [name], MD [facility]  08/09/2014 2:06 PM

## 2014-08-17 NOTE — Anesthesia Postprocedure Evaluation (Signed)
  Anesthesia Post-op Note  Patient: Caitlin Hampton  Procedure(s) Performed: Procedure(s): CARDIOVERSION (N/A)  Patient Location: PACU  Anesthesia Type:MAC  Level of Consciousness: awake and alert   Airway and Oxygen Therapy: Patient Spontanous Breathing  Post-op Pain: none  Post-op Assessment: Post-op Vital signs reviewed, Patient's Cardiovascular Status Stable, Respiratory Function Stable, Patent Airway, No signs of Nausea or vomiting and Pain level controlled  Post-op Vital Signs: Reviewed and stable  Last Vitals:  Filed Vitals:   08/17/14 1210  BP: 151/75  Pulse: 63  Temp:   Resp: 17    Complications: No apparent anesthesia complications

## 2014-08-17 NOTE — Transfer of Care (Signed)
Immediate Anesthesia Transfer of Care Note  Patient: Caitlin Hampton  Procedure(s) Performed: Procedure(s): CARDIOVERSION (N/A)  Patient Location: Endoscopy Unit  Anesthesia Type:MAC  Level of Consciousness: awake, alert , oriented and patient cooperative  Airway & Oxygen Therapy: Patient Spontanous Breathing and Patient connected to nasal cannula oxygen  Post-op Assessment: Report given to PACU RN, Post -op Vital signs reviewed and stable and Patient moving all extremities  Post vital signs: Reviewed and stable  Complications: No apparent anesthesia complications

## 2014-08-22 ENCOUNTER — Encounter (HOSPITAL_COMMUNITY): Payer: Self-pay | Admitting: Cardiology

## 2014-09-09 ENCOUNTER — Ambulatory Visit: Payer: Medicare Other | Admitting: Cardiology

## 2014-09-26 ENCOUNTER — Other Ambulatory Visit: Payer: Self-pay | Admitting: Internal Medicine

## 2014-09-26 DIAGNOSIS — Z1231 Encounter for screening mammogram for malignant neoplasm of breast: Secondary | ICD-10-CM

## 2014-09-27 ENCOUNTER — Ambulatory Visit: Payer: Medicare Other

## 2014-10-04 ENCOUNTER — Other Ambulatory Visit: Payer: Self-pay | Admitting: Internal Medicine

## 2014-10-04 DIAGNOSIS — R609 Edema, unspecified: Secondary | ICD-10-CM

## 2014-10-05 ENCOUNTER — Ambulatory Visit
Admission: RE | Admit: 2014-10-05 | Discharge: 2014-10-05 | Disposition: A | Discharge status: Home / Self Care | Payer: Medicare Other | Source: Ambulatory Visit | Attending: Internal Medicine | Admitting: Internal Medicine

## 2014-10-05 DIAGNOSIS — R609 Edema, unspecified: Secondary | ICD-10-CM

## 2014-10-06 ENCOUNTER — Encounter (HOSPITAL_COMMUNITY): Payer: Self-pay | Admitting: Cardiology

## 2014-10-11 ENCOUNTER — Ambulatory Visit: Payer: Medicare Other | Admitting: Cardiology

## 2014-10-11 ENCOUNTER — Ambulatory Visit: Payer: Self-pay

## 2014-10-12 ENCOUNTER — Ambulatory Visit: Payer: Self-pay

## 2014-10-13 ENCOUNTER — Ambulatory Visit: Payer: Medicare Other | Admitting: Cardiology

## 2014-10-20 ENCOUNTER — Ambulatory Visit: Payer: Self-pay

## 2014-10-21 ENCOUNTER — Ambulatory Visit
Admission: RE | Admit: 2014-10-21 | Discharge: 2014-10-21 | Disposition: A | Discharge status: Home / Self Care | Payer: Medicare Other | Source: Ambulatory Visit | Attending: Internal Medicine | Admitting: Internal Medicine

## 2014-10-27 ENCOUNTER — Encounter (HOSPITAL_COMMUNITY): Payer: Self-pay | Admitting: *Deleted

## 2014-10-27 ENCOUNTER — Emergency Department (HOSPITAL_COMMUNITY)
Admission: EM | Admit: 2014-10-27 | Discharge: 2014-10-27 | Disposition: A | Discharge status: Home / Self Care | Payer: Medicare Other | Attending: Emergency Medicine | Admitting: Emergency Medicine

## 2014-10-27 DIAGNOSIS — Y998 Other external cause status: Secondary | ICD-10-CM | POA: Insufficient documentation

## 2014-10-27 DIAGNOSIS — Y9389 Activity, other specified: Secondary | ICD-10-CM | POA: Diagnosis not present

## 2014-10-27 DIAGNOSIS — S81802A Unspecified open wound, left lower leg, initial encounter: Secondary | ICD-10-CM | POA: Diagnosis not present

## 2014-10-27 DIAGNOSIS — E039 Hypothyroidism, unspecified: Secondary | ICD-10-CM | POA: Insufficient documentation

## 2014-10-27 DIAGNOSIS — J449 Chronic obstructive pulmonary disease, unspecified: Secondary | ICD-10-CM | POA: Insufficient documentation

## 2014-10-27 DIAGNOSIS — I4892 Unspecified atrial flutter: Secondary | ICD-10-CM | POA: Diagnosis not present

## 2014-10-27 DIAGNOSIS — Z8659 Personal history of other mental and behavioral disorders: Secondary | ICD-10-CM | POA: Insufficient documentation

## 2014-10-27 DIAGNOSIS — I10 Essential (primary) hypertension: Secondary | ICD-10-CM | POA: Insufficient documentation

## 2014-10-27 DIAGNOSIS — Z7901 Long term (current) use of anticoagulants: Secondary | ICD-10-CM | POA: Insufficient documentation

## 2014-10-27 DIAGNOSIS — Z8719 Personal history of other diseases of the digestive system: Secondary | ICD-10-CM | POA: Diagnosis not present

## 2014-10-27 DIAGNOSIS — S81812A Laceration without foreign body, left lower leg, initial encounter: Secondary | ICD-10-CM

## 2014-10-27 DIAGNOSIS — Z79899 Other long term (current) drug therapy: Secondary | ICD-10-CM | POA: Diagnosis not present

## 2014-10-27 DIAGNOSIS — I509 Heart failure, unspecified: Secondary | ICD-10-CM | POA: Insufficient documentation

## 2014-10-27 DIAGNOSIS — W228XXA Striking against or struck by other objects, initial encounter: Secondary | ICD-10-CM | POA: Diagnosis not present

## 2014-10-27 DIAGNOSIS — Z87891 Personal history of nicotine dependence: Secondary | ICD-10-CM | POA: Insufficient documentation

## 2014-10-27 DIAGNOSIS — Y9289 Other specified places as the place of occurrence of the external cause: Secondary | ICD-10-CM | POA: Insufficient documentation

## 2014-10-27 NOTE — ED Provider Notes (Addendum)
CSN: [id number]     Arrival date & time 10/27/14  1400 History   First MD Initiated Contact with Patient 10/27/14 1400     Chief Complaint  Patient presents with  . Extremity Laceration     (Consider location/radiation/quality/duration/timing/severity/associated sxs/prior Treatment) HPI Patient presents to the emergency department with a bleeding wound to her left lower leg.  The patient states she was getting into her wheelchair when she hit the front part of her leg against the foot rest of the wheelchair.  She started bleeding significantly due to the fact that she is on Xarelto.  Patient states she applied pressure to the wound and came to the emergency department for further evaluation.  The patient states she did not have any other injuries Past Medical History  Diagnosis Date  . Hypertension   . Thyroid disease   . COPD (chronic obstructive pulmonary disease)   . Anxiety   . H/O hiatal hernia   . Hypothyroidism   . Shortness of breath   . Atrial flutter 09/07/2012    , RVR/ hospital 12/13- rate control and Echo normal- on xarelto, when spontaneously converted she had bradycardia on Metoprolol and Digoxin  . GERD (gastroesophageal reflux disease)   . Rhinitis   . Parotitis     , on CT of face- 12/13  . CHF (congestive heart failure)   . Hx of echocardiogram     Echo (8/15):  EF 60-65%   Past Surgical History  Procedure Laterality Date  . Tonsillectomy    . Breast surgery      benign R brest CA  . Cardioversion N/A 04/02/2014    Procedure: CARDIOVERSION;  Surgeon: Josue Hector, MD;  Location: [facility];  Service: Cardiovascular;  Laterality: N/A;  . Cardioversion N/A 08/17/2014    Procedure: CARDIOVERSION;  Surgeon: [name], MD;  Location: [facility] ENDOSCOPY;  Service: Cardiovascular;  Laterality: N/A;   Family History  Problem Relation Age of Onset  . CAD Mother   . Stroke Mother   . Prostate cancer Father   . Dementia Father    History  Substance Use Topics  .  Smoking status: Former Smoker -- 1.50 packs/day    Types: Cigarettes    Quit date: 07/23/2012  . Smokeless tobacco: Not on file  . Alcohol Use: No     Comment: occassionally   OB History    No data available     Review of Systems All other systems negative except as documented in the HPI. All pertinent positives and negatives as reviewed in the HPI.   Allergies  Review of patient's allergies indicates no known allergies.  Home Medications   Prior to Admission medications   Medication Sig Start Date End Date Taking? Authorizing Provider  acetaminophen (TYLENOL) 500 MG tablet Take 1,000 mg by mouth every 6 (six) hours as needed for mild pain.    Historical Provider, MD  amiodarone (PACERONE) 400 MG tablet Take 1 tablet (400 mg total) by mouth daily. 06/10/14   [name], MD  atorvastatin (LIPITOR) 10 MG tablet Take 10 mg by mouth daily.  05/03/14   Historical Provider, MD  diltiazem (DILACOR XR) 120 MG 24 hr capsule Take 120 mg by mouth 2 (two) times daily.    Historical Provider, MD  furosemide (LASIX) 20 MG tablet Take 1 tablet (20 mg total) by mouth daily. 04/04/14   Brett Canales, PA-C  levothyroxine (SYNTHROID, LEVOTHROID) 100 MCG tablet Take 100 mcg by mouth daily.  Historical Provider, MD  lisinopril (PRINIVIL,ZESTRIL) 40 MG tablet Take 1 tablet (40 mg total) by mouth daily. 04/25/14   Liliane Shi, PA-C  potassium chloride SA (K-DUR,KLOR-CON) 20 MEQ tablet Take 0.5 tablets (10 mEq total) by mouth daily. 05/09/14   [name], MD  Rivaroxaban (XARELTO) 20 MG TABS Take 1 tablet (20 mg total) by mouth daily with breakfast. 09/12/12   Irven Shelling, MD   BP 116/42 mmHg  Pulse 74  Temp(Src) 97.7 F (36.5 C) (Oral)  Resp 22  SpO2 99% Physical Exam  Constitutional: She is oriented to person, place, and time. She appears well-developed and well-nourished.  HENT:  Head: Normocephalic and atraumatic.  Eyes: Pupils are equal, round, and reactive to light.  Neck: Normal  range of motion. Neck supple.  Cardiovascular: Normal rate, regular rhythm and normal heart sounds.   Pulmonary/Chest: Effort normal and breath sounds normal.  Musculoskeletal:       Legs: Neurological: She is alert and oriented to person, place, and time. She exhibits normal muscle tone. Coordination normal.  Skin: Skin is warm and dry. No rash noted. No erythema.  Nursing note and vitals reviewed.   ED Course  Wound repair Date/Time: 10/27/2014 2:41 PM Performed by: Brent General Authorized by: Brent General Consent: Verbal consent obtained. Written consent not obtained. Risks and benefits: risks, benefits and alternatives were discussed Consent given by: patient Patient understanding: patient does not state understanding of the procedure being performed Patient consent: the patient's understanding of the procedure does not match consent given Procedure consent: procedure consent does not match procedure scheduled Relevant documents: relevant documents not present or verified Patient identity confirmed: verbally with patient Time out: Immediately prior to procedure a "time out" was called to verify the correct patient, procedure, equipment, support staff and site/side marked as required. Preparation: Patient was prepped and draped in the usual sterile fashion. Local anesthesia used: no Patient sedated: no Comments: The patient has a skin tear to the anterior distal 1/3 of shin. The area was cleansed and then dermabond was placed to protect the wound.  The area measured 4cm      Brent General, PA-C 10/27/14 1443  Evelina Bucy, MD 10/29/14 [place], PA-C 11/10/14 1633  Evelina Bucy, MD 11/11/14 [phone]

## 2014-10-27 NOTE — Discharge Instructions (Signed)
Return here as needed.  Follow-up with your primary care doctor °

## 2014-10-27 NOTE — ED Notes (Signed)
Pt went to sit down in wheel chair and she hit her left leg that caused a 1 in laceration. Pt is on blood thinners.

## 2014-10-31 ENCOUNTER — Ambulatory Visit: Payer: Self-pay

## 2014-11-02 ENCOUNTER — Ambulatory Visit: Payer: Medicare Other | Admitting: Cardiology

## 2014-11-11 ENCOUNTER — Ambulatory Visit (INDEPENDENT_AMBULATORY_CARE_PROVIDER_SITE_OTHER): Payer: Medicare Other | Admitting: Cardiology

## 2014-11-11 ENCOUNTER — Encounter: Payer: Self-pay | Admitting: Cardiology

## 2014-11-11 VITALS — BP 102/74 | HR 94 | Ht 64.0 in | Wt 253.0 lb

## 2014-11-11 DIAGNOSIS — I10 Essential (primary) hypertension: Secondary | ICD-10-CM

## 2014-11-11 DIAGNOSIS — E669 Obesity, unspecified: Secondary | ICD-10-CM

## 2014-11-11 DIAGNOSIS — I4892 Unspecified atrial flutter: Secondary | ICD-10-CM

## 2014-11-11 NOTE — Patient Instructions (Signed)
The current medical regimen is effective;  continue present plan and medications.  Follow up with Dr Marlou Porch in 4 months.  Thank you for choosing [facility]!!

## 2014-11-11 NOTE — Progress Notes (Signed)
[facility]. [address], [place]  [address] Phone: [phone] Fax:  [phone]  Date:  11/11/2014   ID:  Hampton, Caitlin 04-06-1946, MRN [id number]  PCP:  [name], MD   History of Present Illness: Caitlin Hampton is a 69 y.o. female COPD on home O2, atrial flutter currently on anticoagulation with Xarelto.  She was hospitalized on 12/13 for rapid ventricular response atrial fibrillation.   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 Shortness of breath with activity. She has had some weight gain but she admits to dietary indiscretion.  Wears oxygen at baseline.  8/15-amiodarone was decreased from 400 down to 200. Following this appointment, she was once again in atrial flutter. I decided to increase her amiodarone back to 400.  Her ejection fraction at one point was 40-45% to be tachycardia induced. Currently she is normal EF.  08/09/14-she continues to be in atrial flutter despite increasing her amiodarone at prior visit. We discussed cardioversion.  11/11/14-cardioversion on 08/17/14 was successful. She has fallen on 10/27/14, extremity laceration. Amiodarone has been at 400 mg once a day. Still remained short of breath. Her sisters trying to encourage her to continue with motion, exercise.   Wt Readings from Last 3 Encounters:  11/11/14 253 lb (114.76 kg)  08/17/14 256 lb (116.121 kg)  08/09/14 250 lb (113.399 kg)     Past Medical History  Diagnosis Date  . Hypertension   . Thyroid disease   . COPD (chronic obstructive pulmonary disease)   . Anxiety   . H/O hiatal hernia   . Hypothyroidism   . Shortness of breath   . Atrial flutter 09/07/2012    , RVR/ hospital 12/13- rate control and Echo normal- on xarelto, when spontaneously converted she had bradycardia on Metoprolol and Digoxin  . GERD (gastroesophageal reflux disease)   . Rhinitis   . Parotitis     , on CT of face- 12/13  . CHF (congestive heart failure)   .  Hx of echocardiogram     Echo (8/15):  EF 60-65%    Past Surgical History  Procedure Laterality Date  . Tonsillectomy    . Breast surgery      benign R brest CA  . Cardioversion N/A 04/02/2014    Procedure: CARDIOVERSION;  Surgeon: Josue Hector, MD;  Location: [facility];  Service: Cardiovascular;  Laterality: N/A;  . Cardioversion N/A 08/17/2014    Procedure: CARDIOVERSION;  Surgeon: [name], MD;  Location: [facility] ENDOSCOPY;  Service: Cardiovascular;  Laterality: N/A;    Current Outpatient Prescriptions  Medication Sig Dispense Refill  . acetaminophen (TYLENOL) 500 MG tablet Take 1,000 mg by mouth every 6 (six) hours as needed for mild pain.    Marland Kitchen amiodarone (PACERONE) 400 MG tablet Take 1 tablet (400 mg total) by mouth daily. 30 tablet 11  . atorvastatin (LIPITOR) 10 MG tablet Take 10 mg by mouth daily.     Marland Kitchen diltiazem (DILACOR XR) 120 MG 24 hr capsule Take 120 mg by mouth 2 (two) times daily.    . furosemide (LASIX) 20 MG tablet Take 1 tablet (20 mg total) by mouth daily. 30 tablet 5  . levothyroxine (SYNTHROID, LEVOTHROID) 100 MCG tablet Take 100 mcg by mouth daily.    Marland Kitchen lisinopril (PRINIVIL,ZESTRIL) 40 MG tablet Take 1 tablet (40 mg total) by mouth daily. 30 tablet 11  . potassium chloride SA (K-DUR,KLOR-CON) 20 MEQ tablet Take 0.5 tablets (10 mEq  total) by mouth daily. 15 tablet 6  . Rivaroxaban (XARELTO) 20 MG TABS Take 1 tablet (20 mg total) by mouth daily with breakfast. 30 tablet 11   No current facility-administered medications for this visit.    Allergies:   No Known Allergies  Social History:  The patient  reports that she quit smoking about 2 years ago. Her smoking use included Cigarettes. She smoked 1.50 packs per day. She does not have any smokeless tobacco history on file. She reports that she does not drink alcohol or use illicit drugs.   ROS:  Please see the history of present illness.   Denies any new breathing issues, syncope, orthopnea, PND    PHYSICAL EXAM: VS:   BP 102/74 mmHg  Pulse 94  Ht 5\' 4"  (1.626 m)  Wt 253 lb (114.76 kg)  BMI 43.41 kg/m2 Well nourished, well developed, in no acute distressWheelchair HEENT: normalHome oxygen. Neck: no JVD Cardiac:  Reg RR; no murmur Lungs:  clear to auscultation bilaterally, no wheezing, rhonchi or rales Abd: soft, nontender, no hepatomegaly obesity Ext: no edema, bruising on hand. Skin: warm and dry Neuro: no focal abnormalities noted  EKG:  08/09/14-atrial flutter with 2-1 conduction heart rate 114 bpm     11/11/14-poor quality EKG, heart rate 68 bpm, possibly sinus rhythm.  ASSESSMENT AND PLAN:  1. Atrial flutter-paroxysmal,  She is back in atrial flutter after decreasing her amiodarone from 400 down to 200. She is back to 400 once a day. She is currently short of breath with minimal activity but this is fairly normal for her. She is on home oxygen. Her ejection fraction is normal from echocardiogram on 05/05/14. 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 We will try our best to maintain normal rhythm. Continue with anticoagulation given her risk score of at least 2. Cardioversion continues to be successful. 2. Obesity-continue to encourage weight loss. 3. COPD/hypoxia-home oxygen, likely driving her dyspnea as well.  4. Diastolic heart failure-currently compensated. Ankles, hands no edema. 5. Peripheral neuropathy-left hand. Could be side effect of amiodarone. Discussed. 6. 2 month follow up.  Signed, [name], MD [facility]  11/11/2014 1:58 PM

## 2014-11-28 ENCOUNTER — Ambulatory Visit: Payer: Self-pay

## 2014-12-05 ENCOUNTER — Encounter (HOSPITAL_BASED_OUTPATIENT_CLINIC_OR_DEPARTMENT_OTHER): Payer: Medicare Other | Attending: Plastic Surgery

## 2014-12-05 DIAGNOSIS — I83028 Varicose veins of left lower extremity with ulcer other part of lower leg: Secondary | ICD-10-CM | POA: Insufficient documentation

## 2014-12-05 DIAGNOSIS — L97821 Non-pressure chronic ulcer of other part of left lower leg limited to breakdown of skin: Secondary | ICD-10-CM | POA: Insufficient documentation

## 2014-12-06 ENCOUNTER — Telehealth: Payer: Self-pay | Admitting: Cardiology

## 2014-12-06 NOTE — Telephone Encounter (Signed)
Spoke with Caitlin Hampton who is aware that the dosages of Lasix can be taken about 6 hours apart.  She was very happy to hear this and thanked me for my time and information.

## 2014-12-06 NOTE — Telephone Encounter (Signed)
New message      Pt c/o medication issue:  1. Name of Medication: lasix  2. How are you currently taking this medication (dosage and times per day)? 20mg  bid 3. Are you having a reaction (difficulty breathing--STAT)?  no  4. What is your medication issue? When pt takes lasix in the am and in the pm----she is up all night peeing.  How many hours apart should she be taking the lasix?

## 2014-12-07 ENCOUNTER — Telehealth: Payer: Self-pay | Admitting: Cardiology

## 2014-12-07 NOTE — Telephone Encounter (Signed)
New message       Nurse is with pt now.  Her HR is 48 and bp is 100/58.  No symptoms.  Is this ok?

## 2014-12-07 NOTE — Telephone Encounter (Signed)
Contacted by pt HHRN, Caitlin Hampton, about pt HR being in the 40's during today's visit.  Pt BP 100/58 with oxygen 94 on 3L today.  Pt is asymptomatic no c/o of dizziness, CP, or SOB.  Previous BP reading during [facility] visits 140/78 HR 70's; 130/70 70's; 120/70 HR 60's.   Pt bradycardia reviewed with Flex, NP.  Recommended that pt decrease Diltiazem to once daily and to follow up with Dr. Marlou Porch in 4-6 weeks.  Pt okay for me to talk to sister Caitlin Hampton as she has better understanding of her medications. Shared the recommendations and she agreed.  Pt information forwarded to scheduling to setup appointment.  Reviewed our office number with Caitlin Hampton to call if the becomes symptomatic between now next appointment. Ray RN stated she will be back on Friday to see pt and if there are concerns she will call our office with an update.  Pt sister stated pt has has had decrease energy for the past 6 weeks.  Also, pt medication list indicates pt should take 20 mg of Lasix daily.  Caitlin Hampton stated that pt Lasix was changed to 20 mg BID last time she was in the office and when talke dot nurse yesterday they discussed how to take it.  Told pt I would forward to Dr. Marlou Porch to review and we would call her back if additional changes need to be made, pt and sister agreed.

## 2014-12-08 ENCOUNTER — Telehealth: Payer: Self-pay

## 2014-12-08 NOTE — Telephone Encounter (Signed)
Agree with plan.  SKAINS, MARK, MD  

## 2014-12-08 NOTE — Telephone Encounter (Signed)
HH RN Olivia Mackie called as she was evaluating pt wound and took vital signs.  Pt BP 104/58, 95 on 3L , and HR 40.  Pt is still asymptomatic at this time.  Olivia Mackie going back tomorrow will call our office if there is still not change in pt HR since medication change on 3/16

## 2014-12-11 ENCOUNTER — Inpatient Hospital Stay (HOSPITAL_COMMUNITY)
Admission: EM | Admit: 2014-12-11 | Discharge: 2014-12-16 | DRG: 291 | Disposition: A | Discharge status: Skilled Nursing Facility | Payer: Medicare Other | Attending: Internal Medicine | Admitting: Internal Medicine

## 2014-12-11 ENCOUNTER — Encounter (HOSPITAL_COMMUNITY): Payer: Self-pay | Admitting: *Deleted

## 2014-12-11 ENCOUNTER — Emergency Department (HOSPITAL_COMMUNITY): Payer: Medicare Other

## 2014-12-11 DIAGNOSIS — D509 Iron deficiency anemia, unspecified: Secondary | ICD-10-CM | POA: Diagnosis present

## 2014-12-11 DIAGNOSIS — J449 Chronic obstructive pulmonary disease, unspecified: Secondary | ICD-10-CM | POA: Diagnosis present

## 2014-12-11 DIAGNOSIS — R0602 Shortness of breath: Secondary | ICD-10-CM | POA: Diagnosis present

## 2014-12-11 DIAGNOSIS — R7989 Other specified abnormal findings of blood chemistry: Secondary | ICD-10-CM

## 2014-12-11 DIAGNOSIS — Z7901 Long term (current) use of anticoagulants: Secondary | ICD-10-CM | POA: Diagnosis not present

## 2014-12-11 DIAGNOSIS — I248 Other forms of acute ischemic heart disease: Secondary | ICD-10-CM | POA: Diagnosis present

## 2014-12-11 DIAGNOSIS — I480 Paroxysmal atrial fibrillation: Secondary | ICD-10-CM | POA: Diagnosis present

## 2014-12-11 DIAGNOSIS — L03116 Cellulitis of left lower limb: Secondary | ICD-10-CM | POA: Diagnosis present

## 2014-12-11 DIAGNOSIS — I5043 Acute on chronic combined systolic (congestive) and diastolic (congestive) heart failure: Secondary | ICD-10-CM | POA: Diagnosis present

## 2014-12-11 DIAGNOSIS — D508 Other iron deficiency anemias: Secondary | ICD-10-CM | POA: Diagnosis not present

## 2014-12-11 DIAGNOSIS — I9589 Other hypotension: Secondary | ICD-10-CM | POA: Diagnosis present

## 2014-12-11 DIAGNOSIS — D649 Anemia, unspecified: Secondary | ICD-10-CM | POA: Diagnosis present

## 2014-12-11 DIAGNOSIS — J9610 Chronic respiratory failure, unspecified whether with hypoxia or hypercapnia: Secondary | ICD-10-CM | POA: Diagnosis not present

## 2014-12-11 DIAGNOSIS — J9611 Chronic respiratory failure with hypoxia: Secondary | ICD-10-CM | POA: Diagnosis not present

## 2014-12-11 DIAGNOSIS — Z87891 Personal history of nicotine dependence: Secondary | ICD-10-CM | POA: Diagnosis not present

## 2014-12-11 DIAGNOSIS — I509 Heart failure, unspecified: Secondary | ICD-10-CM | POA: Diagnosis not present

## 2014-12-11 DIAGNOSIS — Z6841 Body Mass Index (BMI) 40.0 and over, adult: Secondary | ICD-10-CM | POA: Diagnosis not present

## 2014-12-11 DIAGNOSIS — I4892 Unspecified atrial flutter: Secondary | ICD-10-CM

## 2014-12-11 DIAGNOSIS — R001 Bradycardia, unspecified: Secondary | ICD-10-CM | POA: Diagnosis present

## 2014-12-11 DIAGNOSIS — N179 Acute kidney failure, unspecified: Secondary | ICD-10-CM | POA: Diagnosis present

## 2014-12-11 DIAGNOSIS — I214 Non-ST elevation (NSTEMI) myocardial infarction: Secondary | ICD-10-CM

## 2014-12-11 DIAGNOSIS — N182 Chronic kidney disease, stage 2 (mild): Secondary | ICD-10-CM | POA: Diagnosis present

## 2014-12-11 DIAGNOSIS — I5022 Chronic systolic (congestive) heart failure: Secondary | ICD-10-CM

## 2014-12-11 DIAGNOSIS — E039 Hypothyroidism, unspecified: Secondary | ICD-10-CM | POA: Diagnosis present

## 2014-12-11 DIAGNOSIS — K922 Gastrointestinal hemorrhage, unspecified: Secondary | ICD-10-CM

## 2014-12-11 DIAGNOSIS — Z9981 Dependence on supplemental oxygen: Secondary | ICD-10-CM | POA: Diagnosis not present

## 2014-12-11 DIAGNOSIS — L02419 Cutaneous abscess of limb, unspecified: Secondary | ICD-10-CM

## 2014-12-11 DIAGNOSIS — I82401 Acute embolism and thrombosis of unspecified deep veins of right lower extremity: Secondary | ICD-10-CM

## 2014-12-11 DIAGNOSIS — J9620 Acute and chronic respiratory failure, unspecified whether with hypoxia or hypercapnia: Secondary | ICD-10-CM | POA: Diagnosis present

## 2014-12-11 DIAGNOSIS — I129 Hypertensive chronic kidney disease with stage 1 through stage 4 chronic kidney disease, or unspecified chronic kidney disease: Secondary | ICD-10-CM | POA: Diagnosis present

## 2014-12-11 DIAGNOSIS — E669 Obesity, unspecified: Secondary | ICD-10-CM | POA: Diagnosis present

## 2014-12-11 DIAGNOSIS — K219 Gastro-esophageal reflux disease without esophagitis: Secondary | ICD-10-CM | POA: Diagnosis present

## 2014-12-11 DIAGNOSIS — R195 Other fecal abnormalities: Secondary | ICD-10-CM | POA: Diagnosis present

## 2014-12-11 DIAGNOSIS — I10 Essential (primary) hypertension: Secondary | ICD-10-CM

## 2014-12-11 DIAGNOSIS — N189 Chronic kidney disease, unspecified: Secondary | ICD-10-CM

## 2014-12-11 DIAGNOSIS — I5033 Acute on chronic diastolic (congestive) heart failure: Secondary | ICD-10-CM

## 2014-12-11 DIAGNOSIS — D72829 Elevated white blood cell count, unspecified: Secondary | ICD-10-CM | POA: Diagnosis present

## 2014-12-11 DIAGNOSIS — Z9229 Personal history of other drug therapy: Secondary | ICD-10-CM

## 2014-12-11 DIAGNOSIS — I82409 Acute embolism and thrombosis of unspecified deep veins of unspecified lower extremity: Secondary | ICD-10-CM | POA: Diagnosis present

## 2014-12-11 DIAGNOSIS — L03119 Cellulitis of unspecified part of limb: Secondary | ICD-10-CM

## 2014-12-11 DIAGNOSIS — E038 Other specified hypothyroidism: Secondary | ICD-10-CM

## 2014-12-11 DIAGNOSIS — F419 Anxiety disorder, unspecified: Secondary | ICD-10-CM | POA: Diagnosis present

## 2014-12-11 DIAGNOSIS — I252 Old myocardial infarction: Secondary | ICD-10-CM | POA: Diagnosis not present

## 2014-12-11 DIAGNOSIS — R778 Other specified abnormalities of plasma proteins: Secondary | ICD-10-CM

## 2014-12-11 DIAGNOSIS — J441 Chronic obstructive pulmonary disease with (acute) exacerbation: Secondary | ICD-10-CM | POA: Diagnosis present

## 2014-12-11 DIAGNOSIS — J9690 Respiratory failure, unspecified, unspecified whether with hypoxia or hypercapnia: Secondary | ICD-10-CM | POA: Diagnosis present

## 2014-12-11 DIAGNOSIS — L03115 Cellulitis of right lower limb: Secondary | ICD-10-CM | POA: Diagnosis present

## 2014-12-11 DIAGNOSIS — J9621 Acute and chronic respiratory failure with hypoxia: Secondary | ICD-10-CM

## 2014-12-11 DIAGNOSIS — J9600 Acute respiratory failure, unspecified whether with hypoxia or hypercapnia: Secondary | ICD-10-CM

## 2014-12-11 DIAGNOSIS — J438 Other emphysema: Secondary | ICD-10-CM

## 2014-12-11 DIAGNOSIS — Z720 Tobacco use: Secondary | ICD-10-CM | POA: Diagnosis present

## 2014-12-11 HISTORY — DX: Non-ST elevation (NSTEMI) myocardial infarction: I21.4

## 2014-12-11 LAB — COMPREHENSIVE METABOLIC PANEL
ALBUMIN: 3.7 g/dL (ref 3.5–5.2)
ALT: 67 U/L — ABNORMAL HIGH (ref 0–35)
ANION GAP: 10 (ref 5–15)
AST: 36 U/L (ref 0–37)
Alkaline Phosphatase: 88 U/L (ref 39–117)
BUN: 39 mg/dL — AB (ref 6–23)
CALCIUM: 8.6 mg/dL (ref 8.4–10.5)
CHLORIDE: 105 mmol/L (ref 96–112)
CO2: 24 mmol/L (ref 19–32)
Creatinine, Ser: 1.48 mg/dL — ABNORMAL HIGH (ref 0.50–1.10)
GFR calc Af Amer: 41 mL/min — ABNORMAL LOW (ref >90)
GFR calc non Af Amer: 35 mL/min — ABNORMAL LOW (ref >90)
Glucose, Bld: 112 mg/dL — ABNORMAL HIGH (ref 70–99)
Potassium: 4.8 mmol/L (ref 3.5–5.1)
Sodium: 139 mmol/L (ref 135–145)
Total Bilirubin: 1.1 mg/dL (ref 0.3–1.2)
Total Protein: 6.7 g/dL (ref 6.0–8.3)

## 2014-12-11 LAB — MAGNESIUM: Magnesium: 2.6 mg/dL — ABNORMAL HIGH (ref 1.5–2.5)

## 2014-12-11 LAB — PROTIME-INR
INR: 4.07 — AB (ref 0.00–1.49)
PROTHROMBIN TIME: 39.8 s — AB (ref 11.6–15.2)

## 2014-12-11 LAB — CBC
HEMATOCRIT: 30 % — AB (ref 36.0–46.0)
HEMOGLOBIN: 8.9 g/dL — AB (ref 12.0–15.0)
MCH: 27.7 pg (ref 26.0–34.0)
MCHC: 29.7 g/dL — ABNORMAL LOW (ref 30.0–36.0)
MCV: 93.5 fL (ref 78.0–100.0)
Platelets: 261 10*3/uL (ref 150–400)
RBC: 3.21 MIL/uL — AB (ref 3.87–5.11)
RDW: 16.1 % — AB (ref 11.5–15.5)
WBC: 18.1 10*3/uL — ABNORMAL HIGH (ref 4.0–10.5)

## 2014-12-11 LAB — BRAIN NATRIURETIC PEPTIDE: B Natriuretic Peptide: 1060.6 pg/mL — ABNORMAL HIGH (ref 0.0–100.0)

## 2014-12-11 LAB — BLOOD GAS, ARTERIAL
Acid-base deficit: 0.1 mmol/L (ref 0.0–2.0)
Bicarbonate: 23.8 mEq/L (ref 20.0–24.0)
DRAWN BY: 331471
O2 CONTENT: 3 L/min
O2 Saturation: 90.2 %
PATIENT TEMPERATURE: 98.6
PCO2 ART: 38 mmHg (ref 35.0–45.0)
TCO2: 21.5 mmol/L (ref 0–100)
pH, Arterial: 7.413 (ref 7.350–7.450)
pO2, Arterial: 61.6 mmHg — ABNORMAL LOW (ref 80.0–100.0)

## 2014-12-11 LAB — LACTIC ACID, PLASMA: Lactic Acid, Venous: 1.7 mmol/L (ref 0.5–2.0)

## 2014-12-11 LAB — URINALYSIS, ROUTINE W REFLEX MICROSCOPIC
Glucose, UA: NEGATIVE mg/dL
HGB URINE DIPSTICK: NEGATIVE
Ketones, ur: NEGATIVE mg/dL
Leukocytes, UA: NEGATIVE
Nitrite: NEGATIVE
PH: 5.5 (ref 5.0–8.0)
Protein, ur: NEGATIVE mg/dL
SPECIFIC GRAVITY, URINE: 1.022 (ref 1.005–1.030)
Urobilinogen, UA: 1 mg/dL (ref 0.0–1.0)

## 2014-12-11 LAB — TROPONIN I: Troponin I: 0.05 ng/mL — ABNORMAL HIGH (ref <0.031)

## 2014-12-11 LAB — I-STAT TROPONIN, ED: Troponin i, poc: 0.02 ng/mL (ref 0.00–0.08)

## 2014-12-11 LAB — TSH: TSH: 0.632 u[IU]/mL (ref 0.350–4.500)

## 2014-12-11 MED ORDER — SODIUM CHLORIDE 0.9 % IV SOLN: Freq: Once | INTRAVENOUS | Status: DC

## 2014-12-11 MED ORDER — SODIUM CHLORIDE 0.9 % IV SOLN
1000.0000 mL | INTRAVENOUS | Status: DC
  Administered 2014-12-11: 1000 mL via INTRAVENOUS

## 2014-12-11 MED ORDER — SODIUM CHLORIDE 0.9 % IJ SOLN
3.0000 mL | Freq: Two times a day (BID) | INTRAMUSCULAR | Status: DC
Start: 2014-12-11 — End: 2014-12-16
  Administered 2014-12-11 – 2014-12-16 (×6): 3 mL via INTRAVENOUS

## 2014-12-11 MED ORDER — IPRATROPIUM-ALBUTEROL 0.5-2.5 (3) MG/3ML IN SOLN
3.0000 mL | Freq: Four times a day (QID) | RESPIRATORY_TRACT | Status: DC
  Administered 2014-12-11 – 2014-12-12 (×3): 3 mL via RESPIRATORY_TRACT
  Filled 2014-12-11 (×3): qty 3

## 2014-12-11 MED ORDER — ALBUTEROL SULFATE (2.5 MG/3ML) 0.083% IN NEBU: 2.5000 mg | INHALATION_SOLUTION | Freq: Four times a day (QID) | RESPIRATORY_TRACT | Status: DC | PRN

## 2014-12-11 MED ORDER — SODIUM CHLORIDE 0.9 % IV SOLN: 250.0000 mL | INTRAVENOUS | Status: DC | PRN

## 2014-12-11 MED ORDER — MORPHINE SULFATE 2 MG/ML IJ SOLN
2.0000 mg | Freq: Once | INTRAMUSCULAR | Status: AC
  Administered 2014-12-11: 2 mg via INTRAVENOUS
  Filled 2014-12-11: qty 1

## 2014-12-11 MED ORDER — VANCOMYCIN HCL IN DEXTROSE 1-5 GM/200ML-% IV SOLN: 1000.0000 mg | Freq: Once | INTRAVENOUS | Status: DC

## 2014-12-11 MED ORDER — DEXTROSE 5 % IV SOLN
500.0000 mg | Freq: Every day | INTRAVENOUS | Status: DC
  Administered 2014-12-12: 500 mg via INTRAVENOUS
  Filled 2014-12-11: qty 500

## 2014-12-11 MED ORDER — LEVOTHYROXINE SODIUM 100 MCG PO TABS
100.0000 ug | ORAL_TABLET | Freq: Every day | ORAL | Status: DC
  Administered 2014-12-12 – 2014-12-16 (×5): 100 ug via ORAL
  Filled 2014-12-11 (×6): qty 1

## 2014-12-11 MED ORDER — METHYLPREDNISOLONE SODIUM SUCC 125 MG IJ SOLR
60.0000 mg | Freq: Every day | INTRAMUSCULAR | Status: DC
  Administered 2014-12-12: 60 mg via INTRAVENOUS
  Filled 2014-12-11 (×2): qty 0.96

## 2014-12-11 MED ORDER — CEFTRIAXONE SODIUM IN DEXTROSE 20 MG/ML IV SOLN
1.0000 g | Freq: Every day | INTRAVENOUS | Status: DC
  Administered 2014-12-12: 1 g via INTRAVENOUS
  Filled 2014-12-11: qty 50

## 2014-12-11 MED ORDER — ONDANSETRON HCL 4 MG/2ML IJ SOLN: 4.0000 mg | Freq: Four times a day (QID) | INTRAMUSCULAR | Status: DC | PRN

## 2014-12-11 MED ORDER — ATORVASTATIN CALCIUM 10 MG PO TABS
10.0000 mg | ORAL_TABLET | Freq: Every day | ORAL | Status: DC
  Administered 2014-12-12 – 2014-12-16 (×5): 10 mg via ORAL
  Filled 2014-12-11 (×5): qty 1

## 2014-12-11 MED ORDER — ASPIRIN EC 81 MG PO TBEC
81.0000 mg | DELAYED_RELEASE_TABLET | Freq: Every day | ORAL | Status: DC
Start: 2014-12-11 — End: 2014-12-12
  Administered 2014-12-11 – 2014-12-12 (×2): 81 mg via ORAL
  Filled 2014-12-11 (×2): qty 1

## 2014-12-11 MED ORDER — ACETAMINOPHEN 325 MG PO TABS: 650.0000 mg | ORAL_TABLET | ORAL | Status: DC | PRN

## 2014-12-11 MED ORDER — VANCOMYCIN HCL IN DEXTROSE 1-5 GM/200ML-% IV SOLN
1000.0000 mg | Freq: Once | INTRAVENOUS | Status: AC
  Administered 2014-12-11: 1000 mg via INTRAVENOUS
  Filled 2014-12-11: qty 200

## 2014-12-11 MED ORDER — SODIUM CHLORIDE 0.9 % IV SOLN
INTRAVENOUS | Status: DC
  Administered 2014-12-12 (×2): via INTRAVENOUS

## 2014-12-11 MED ORDER — SODIUM CHLORIDE 0.9 % IJ SOLN: 3.0000 mL | INTRAMUSCULAR | Status: DC | PRN

## 2014-12-11 MED ORDER — ALBUTEROL SULFATE HFA 108 (90 BASE) MCG/ACT IN AERS: 2.0000 | INHALATION_SPRAY | Freq: Four times a day (QID) | RESPIRATORY_TRACT | Status: DC | PRN

## 2014-12-11 MED ORDER — OSELTAMIVIR PHOSPHATE 75 MG PO CAPS
75.0000 mg | ORAL_CAPSULE | Freq: Two times a day (BID) | ORAL | Status: DC
  Administered 2014-12-12: 75 mg via ORAL
  Filled 2014-12-11 (×2): qty 1

## 2014-12-11 NOTE — ED Notes (Signed)
Pt contacted EMS today d/t increased pain/weakness right leg, pt has previous injury to left foot(wound from hitting it along wheel chair) dressing at site.  Pt attempting to walk today and her RLE "gave out on her". Pt denies neck/back pain no LOC. She is on 3Lt for COPD at home. Pt currently breathing through mouth mask applied, pt breathing some better. She is currently taking blood thinners, bruise right wrist. Hx afib , HTN, 132/89, 60 irreg, 18 , 91% 4 lt

## 2014-12-11 NOTE — ED Provider Notes (Signed)
CSN: [id number]     Arrival date & time 12/11/14  1416 History   First MD Initiated Contact with Patient 12/11/14 1520     Chief Complaint  Patient presents with  . Leg Pain    right    HPI Pt was trying to walk today when she felt like her leg gave away on her right.  It was not painful although she did notice a little soreness to touch in the morning.  She does not have any weakness in her right arm.  She has several medical problems including COPD.  She is on chronic oxygen.  At baseline she does have difficulty walking and getting around.  She has noticed a gradual worsening though over the last several weeks.  No fevers or chills.  No vomiting or diarrhea.  No back pain. Past Medical History  Diagnosis Date  . Hypertension   . Thyroid disease   . COPD (chronic obstructive pulmonary disease)   . Anxiety   . H/O hiatal hernia   . Hypothyroidism   . Shortness of breath   . Atrial flutter 09/07/2012    , RVR/ hospital 12/13- rate control and Echo normal- on xarelto, when spontaneously converted she had bradycardia on Metoprolol and Digoxin  . GERD (gastroesophageal reflux disease)   . Rhinitis   . Parotitis     , on CT of face- 12/13  . CHF (congestive heart failure)   . Hx of echocardiogram     Echo (8/15):  EF 60-65%   Past Surgical History  Procedure Laterality Date  . Tonsillectomy    . Breast surgery      benign R brest CA  . Cardioversion N/A 04/02/2014    Procedure: CARDIOVERSION;  Surgeon: Josue Hector, MD;  Location: [facility];  Service: Cardiovascular;  Laterality: N/A;  . Cardioversion N/A 08/17/2014    Procedure: CARDIOVERSION;  Surgeon: [name], MD;  Location: [facility] ENDOSCOPY;  Service: Cardiovascular;  Laterality: N/A;   Family History  Problem Relation Age of Onset  . CAD Mother   . Stroke Mother   . Prostate cancer Father   . Dementia Father    History  Substance Use Topics  . Smoking status: Former Smoker -- 1.50 packs/day    Types: Cigarettes    Quit  date: 07/23/2012  . Smokeless tobacco: Not on file  . Alcohol Use: No     Comment: occassionally   OB History    No data available     Review of Systems  All other systems reviewed and are negative.     Allergies  Review of patient's allergies indicates no known allergies.  Home Medications   Prior to Admission medications   Medication Sig Start Date End Date Taking? Authorizing Provider  acetaminophen (TYLENOL) 500 MG tablet Take 1,000 mg by mouth every 6 (six) hours as needed for mild pain.   Yes Historical Provider, MD  albuterol (PROVENTIL HFA;VENTOLIN HFA) 108 (90 BASE) MCG/ACT inhaler Inhale 2 puffs into the lungs every 6 (six) hours as needed for wheezing or shortness of breath.   Yes Historical Provider, MD  amiodarone (PACERONE) 400 MG tablet Take 1 tablet (400 mg total) by mouth daily. 06/10/14  Yes Jerline Pain, MD  atorvastatin (LIPITOR) 10 MG tablet Take 10 mg by mouth daily.  05/03/14  Yes Historical Provider, MD  diltiazem (DILACOR XR) 120 MG 24 hr capsule Take 120 mg by mouth daily.    Yes Historical Provider, MD  furosemide (LASIX)  20 MG tablet Take 1 tablet (20 mg total) by mouth daily. 04/04/14  Yes Brett Canales, PA-C  levothyroxine (SYNTHROID, LEVOTHROID) 100 MCG tablet Take 100 mcg by mouth daily.   Yes Historical Provider, MD  lisinopril (PRINIVIL,ZESTRIL) 40 MG tablet Take 1 tablet (40 mg total) by mouth daily. 04/25/14  Yes Liliane Shi, PA-C  potassium chloride SA (K-DUR,KLOR-CON) 20 MEQ tablet Take 0.5 tablets (10 mEq total) by mouth daily. 05/09/14  Yes Jerline Pain, MD  ranitidine (ZANTAC) 75 MG tablet Take 75 mg by mouth daily as needed for heartburn.   Yes Historical Provider, MD  Rivaroxaban (XARELTO) 20 MG TABS Take 1 tablet (20 mg total) by mouth daily with breakfast. 09/12/12  Yes Lavone Orn, MD  tiotropium (SPIRIVA) 18 MCG inhalation capsule Place 18 mcg into inhaler and inhale daily.    Historical Provider, MD   BP 123/39 mmHg  Pulse 43   Temp(Src) 97.8 F (36.6 C) (Oral)  Resp 16  SpO2 93% Physical Exam  Constitutional: No distress.  Obese,   HENT:  Head: Normocephalic and atraumatic.  Right Ear: External ear normal.  Left Ear: External ear normal.  Mouth/Throat: No oropharyngeal exudate.  Eyes: Conjunctivae are normal. Right eye exhibits no discharge. Left eye exhibits no discharge. No scleral icterus.  Neck: Neck supple. No tracheal deviation present.  Cardiovascular: Normal rate, regular rhythm and intact distal pulses.   Pulmonary/Chest: Accessory muscle usage present. No stridor. No respiratory distress. She has decreased breath sounds. She has no wheezes. She has no rales.  Labored breathing, prolonged expirations  Abdominal: Soft. Bowel sounds are normal. She exhibits no distension. There is no tenderness. There is no rebound and no guarding.  Musculoskeletal: She exhibits edema. She exhibits no tenderness.       Lumbar back: She exhibits no tenderness.       Right lower leg: She exhibits swelling and edema. She exhibits no bony tenderness and no laceration.  Edema in all limbs, ttp and ecchymoses/erythema rle ,   Neurological: She is alert. She has normal strength. No cranial nerve deficit (no facial droop, extraocular movements intact, no slurred speech) or sensory deficit. She exhibits normal muscle tone. She displays no seizure activity. Coordination normal.  Generalized weakness, difficulty lifting either leg off the bed but able to do so, strong grip strength bilaterally  Skin: Skin is warm and dry. She is not diaphoretic.  Dry scaling skin   Psychiatric: She has a normal mood and affect.  Nursing note and vitals reviewed.   ED Course  Procedures (including critical care time) Labs Review Labs Reviewed  CBC - Abnormal; Notable for the following:    WBC 18.1 (*)    RBC 3.21 (*)    Hemoglobin 8.9 (*)    HCT 30.0 (*)    MCHC 29.7 (*)    RDW 16.1 (*)    All other components within normal limits   COMPREHENSIVE METABOLIC PANEL - Abnormal; Notable for the following:    Glucose, Bld 112 (*)    BUN 39 (*)    Creatinine, Ser 1.48 (*)    ALT 67 (*)    GFR calc non Af Amer 35 (*)    GFR calc Af Amer 41 (*)    All other components within normal limits  PROTIME-INR - Abnormal; Notable for the following:    Prothrombin Time 39.8 (*)    INR 4.07 (*)    All other components within normal limits  URINALYSIS, ROUTINE W  REFLEX MICROSCOPIC - Abnormal; Notable for the following:    APPearance CLOUDY (*)    Bilirubin Urine SMALL (*)    All other components within normal limits  BLOOD GAS, ARTERIAL - Abnormal; Notable for the following:    pO2, Arterial 61.6 (*)    All other components within normal limits  I-STAT TROPOININ, ED  POC OCCULT BLOOD, ED    Imaging Review Dg Chest 2 View  12/11/2014   CLINICAL DATA:  Right leg pain and weakness.  EXAM: CHEST  2 VIEW  COMPARISON:  03/31/2014 and prior radiographs  FINDINGS: Cardiomegaly and pulmonary vascular congestion identified.  Left hemidiaphragm elevation again noted.  There is no evidence of focal airspace disease, pulmonary edema, suspicious pulmonary nodule/mass, pleural effusion, or pneumothorax. No acute bony abnormalities are identified.  IMPRESSION: Cardiomegaly with pulmonary vascular congestion.   Electronically Signed   By: Margarette Canada M.D.   On: 12/11/2014 16:42     EKG Interpretation   Date/Time:  Sunday December 11 2014 16:30:29 EDT Ventricular Rate:  84 PR Interval:    QRS Duration: 96 QT Interval:  417 QTC Calculation: 493 R Axis:   49 Text Interpretation:  poor data quality, undetermined rhythm Low voltage,  extremity and precordial leads Minimal ST depression Minimal ST elevation,  inferior leads Baseline wander in lead(s) II rate slower since last  tracing Confirmed by Jada Kuhnert  MD-J, Lowana Hable ([id number]) on 12/11/2014 4:35:35 PM      MDM   Final diagnoses:  Sinus bradycardia  Cellulitis of right lower extremity  Anemia,  unspecified anemia type  Other emphysema    The patient presented to the emergency room with complaints of lower extremity weakness.  On exam she has an area of erythema swelling with tenderness of her right lower leg.  This appears to be consistent with a contusion and possibly an early cellulitis.  The patient also has a persistent bradycardia.  She says when she saw her doctor recently her cardiologist decreased her blood pressure medication for that reason.  Patient also has evidence of anemia and renal sufficiency that's worse compared to prior labs.  She denies any blood in her stool or dark stools.  We will add on a guaic.  Plan on admission for IV abx for cellulitis, further evaluation and monitoring of her renal insuff and anemia.    Dorie Rank, MD 12/11/14 [phone]

## 2014-12-11 NOTE — ED Notes (Signed)
Bed: WA21 Expected date:  Expected time:  Means of arrival:  Comments: EMS fall leg pain

## 2014-12-11 NOTE — H&P (Addendum)
[facility] History and Physical  ANAKA BEAZER EXB:[id number] DOB: 1946/08/08 DOA: 12/11/2014  Referring physician:  PCP: [name], MD  Specialists:   Chief Complaint:   HPI: KAJUANA SHAREEF is a 69 y.o. WF PMHx anxiety, HTN, systolic CHF, atrial fibrillation on anticoagulant, COPD on home oxygen 3 L O2 via [place], hypothyroidism, dry weight unknown,  Pt was trying to walk today when she felt like her leg gave away on her right. It was not painful although she did notice a little soreness to touch in the morning. She does not have any weakness in her right arm. She has several medical problems including COPD. She is on chronic oxygen. At baseline she does have difficulty walking and getting around. She has noticed a gradual worsening though over the last several weeks. No fevers or chills. No vomiting or diarrhea. No back pain. States uses walker and home, increased SOB, negative CP, positive dry cough, LLE lacerations seen by wound care nurse which occurred approximately 2-3 weeks ago. Dressing changed ~ 1 week ago     Review of Systems: The patient denies anorexia, fever, weight loss,, vision loss, decreased hearing, hoarseness, chest pain, syncope, balance deficits, hemoptysis, abdominal pain, melena, hematochezia, severe indigestion/heartburn, hematuria, incontinence, genital sores, transient blindness, depression, unusual weight change, abnormal bleeding, enlarged lymph nodes, angioedema, and breast masses.    TRAVEL HISTORY: NA   Consultants:  NA  Procedure/Significant Events:  3/20 CXR; Cardiomegaly with pulmonary vascular congestion. Right lower extremity Doppler pending    Culture  3/20 blood pending 3/20 urine pending 3/20 influenza panel pending 3/20 sputum pending 3/20 Legionella urine antigen pending 3/20 strep pneumo urine antigen pending   Antibiotics:  Vancomycin 3/20>> Ceftriaxone 3/20>> Azithromycin 3/20>> Tamiflu 3/20>>   DVT  prophylaxis:  -On hold supratherapeutic INR -Xarelto on hold  Devices  NA   LINES / TUBES:   NA  Past Medical History  Diagnosis Date  . Hypertension   . Thyroid disease   . COPD (chronic obstructive pulmonary disease)   . Anxiety   . H/O hiatal hernia   . Hypothyroidism   . Shortness of breath   . Atrial flutter 09/07/2012    , RVR/ hospital 12/13- rate control and Echo normal- on xarelto, when spontaneously converted she had bradycardia on Metoprolol and Digoxin  . GERD (gastroesophageal reflux disease)   . Rhinitis   . Parotitis     , on CT of face- 12/13  . CHF (congestive heart failure)   . Hx of echocardiogram     Echo (8/15):  EF 60-65%  . NSTEMI (non-ST elevated myocardial infarction)    Past Surgical History  Procedure Laterality Date  . Tonsillectomy    . Breast surgery      benign R brest CA  . Cardioversion N/A 04/02/2014    Procedure: CARDIOVERSION;  Surgeon: Josue Hector, MD;  Location: [place];  Service: Cardiovascular;  Laterality: N/A;  . Cardioversion N/A 08/17/2014    Procedure: CARDIOVERSION;  Surgeon: [name], MD;  Location: [facility] ENDOSCOPY;  Service: Cardiovascular;  Laterality: N/A;   Social History:  reports that she quit smoking about 2 years ago. Her smoking use included Cigarettes. She smoked 1.50 packs per day. She does not have any smokeless tobacco history on file. She reports that she does not drink alcohol or use illicit drugs.    No Known Allergies  Family History  Problem Relation Age of Onset  . CAD Mother   . Stroke Mother   .  Prostate cancer Father   . Dementia Father      Prior to Admission medications   Medication Sig Start Date End Date Taking? Authorizing Provider  acetaminophen (TYLENOL) 500 MG tablet Take 1,000 mg by mouth every 6 (six) hours as needed for mild pain.   Yes Historical Provider, MD  albuterol (PROVENTIL HFA;VENTOLIN HFA) 108 (90 BASE) MCG/ACT inhaler Inhale 2 puffs into the lungs every 6 (six) hours  as needed for wheezing or shortness of breath.   Yes Historical Provider, MD  amiodarone (PACERONE) 400 MG tablet Take 1 tablet (400 mg total) by mouth daily. 06/10/14  Yes Jerline Pain, MD  atorvastatin (LIPITOR) 10 MG tablet Take 10 mg by mouth daily.  05/03/14  Yes Historical Provider, MD  diltiazem (DILACOR XR) 120 MG 24 hr capsule Take 120 mg by mouth daily.    Yes Historical Provider, MD  furosemide (LASIX) 20 MG tablet Take 1 tablet (20 mg total) by mouth daily. 04/04/14  Yes Brett Canales, PA-C  levothyroxine (SYNTHROID, LEVOTHROID) 100 MCG tablet Take 100 mcg by mouth daily.   Yes Historical Provider, MD  lisinopril (PRINIVIL,ZESTRIL) 40 MG tablet Take 1 tablet (40 mg total) by mouth daily. 04/25/14  Yes Liliane Shi, PA-C  potassium chloride SA (K-DUR,KLOR-CON) 20 MEQ tablet Take 0.5 tablets (10 mEq total) by mouth daily. 05/09/14  Yes Jerline Pain, MD  ranitidine (ZANTAC) 75 MG tablet Take 75 mg by mouth daily as needed for heartburn.   Yes Historical Provider, MD  Rivaroxaban (XARELTO) 20 MG TABS Take 1 tablet (20 mg total) by mouth daily with breakfast. 09/12/12  Yes Lavone Orn, MD  tiotropium (SPIRIVA) 18 MCG inhalation capsule Place 18 mcg into inhaler and inhale daily.    Historical Provider, MD   Physical Exam: Filed Vitals:   12/11/14 1449 12/11/14 1715 12/11/14 1915 12/11/14 2031  BP: 119/54 123/39 122/39 112/47  Pulse: 45 43 43 45  Temp: 97.8 F (36.6 C)   97.9 F (36.6 C)  TempSrc: Oral   Oral  Resp: 18 16 23 26   Height:    5\' 4"  (1.626 m)  Weight:    117 kg (257 lb 15 oz)  SpO2: 90% 93% 94% 95%     General:  A/O 4, acute on chronic respiratory distress  Eyes: Pupils equal round reactive to light and accommodation  Neck: Negative JVD, negative lymphadenopathy  Cardiovascular: Irregular irregular rhythm and rate, bradycardic, negative murmurs rubs or gallops, normal S1/S2  Respiratory: Diffusely Poor air movement, diffuse expiratory and expiratory  wheezing,  Abdomen: Morbidly obese, soft, nontender, plus bowel sound  Skin: Right lower extremity posterior aspect extremely painful to touch, palpable cord, negative Homans sign, positive venous stasis; left lower extremity extremely painful to palpation wound care wrapping from mid calf down. Attempted to remove dressing to observe wound was able to remove outer layer however an earlier adhere to wound.  Musculoskeletal: Bilateral lower extremity venous stasis ulcers,  Neurologic: Within normal limit.  Labs on Admission:  Basic Metabolic Panel:  Recent Labs Lab 12/11/14 1625 12/11/14 1958  NA 139  --   K 4.8  --   CL 105  --   CO2 24  --   GLUCOSE 112*  --   BUN 39*  --   CREATININE 1.48*  --   CALCIUM 8.6  --   MG  --  2.6*   Liver Function Tests:  Recent Labs Lab 12/11/14 1625  AST 36  ALT 67*  ALKPHOS 88  BILITOT 1.1  PROT 6.7  ALBUMIN 3.7   No results for input(s): LIPASE, AMYLASE in the last 168 hours. No results for input(s): AMMONIA in the last 168 hours. CBC:  Recent Labs Lab 12/11/14 1625  WBC 18.1*  HGB 8.9*  HCT 30.0*  MCV 93.5  PLT 261   Cardiac Enzymes:  Recent Labs Lab 12/11/14 1958  TROPONINI 0.05*    BNP (last 3 results)  Recent Labs  12/11/14 1958  BNP 1060.6*    ProBNP (last 3 results)  Recent Labs  03/31/14 1555 04/01/14 0339 04/25/14 1120  PROBNP 918.7* 993.8* 183.0*    CBG: No results for input(s): GLUCAP in the last 168 hours.  Radiological Exams on Admission: Dg Chest 2 View  12/11/2014   CLINICAL DATA:  Right leg pain and weakness.  EXAM: CHEST  2 VIEW  COMPARISON:  03/31/2014 and prior radiographs  FINDINGS: Cardiomegaly and pulmonary vascular congestion identified.  Left hemidiaphragm elevation again noted.  There is no evidence of focal airspace disease, pulmonary edema, suspicious pulmonary nodule/mass, pleural effusion, or pneumothorax. No acute bony abnormalities are identified.  IMPRESSION:  Cardiomegaly with pulmonary vascular congestion.   Electronically Signed   By: Margarette Canada M.D.   On: 12/11/2014 16:42    EKG: Initial E KG poor quality repeat   Assessment/Plan Active Problems:   Atrial flutter with rapid ventricular response, unknown duration   Tobacco abuse   Obese   Respiratory failure requiring O2, inhalers   Hypertension   COPD (chronic obstructive pulmonary disease)   Hypothyroidism   CHF (congestive heart failure)   Chronic systolic CHF (congestive heart failure)   Acute on chronic respiratory failure   GI bleed   Absolute anemia   Cellulitis of right lower extremity   Sinus bradycardia   NSTEMI (non-ST elevated myocardial infarction)   Other specified hypotension   Leukocytosis   DVT (deep venous thrombosis)   Chronic systolic CHF -Obtain repeat EKG secondary to poor quality of the first EKG -Obtain BNP -Troponin 3 pending -Echocardiogram pending -Daily weight; admission weight= 117 kg -Strict I&O -Patient appears dry will start normal saline at 82ml/hr; monitor closely for fluid overload -Transfuse if hemoglobin<8  Atrial fibrillation with RVR -Patient bradycardic symptomatically hold home dose amiodarone 400 mg daily, and diltiazem 120 mg daily -Patient also borderline hypotensive will hold lisinopril 40 mg daily -Hold home dose Lasix 20 mg daily  Bradycardia symptomatically -See A. fib RVR  NSTEMI? -Await results of troponin, BNP, echocardiogram -If significant changes in troponin, echocardiogram would contact cardiology in the a.m.; patient most likely a poor candidate for any procedure. -HEART score= 6  HTN/Hypotensive -Patient normally hypertensive but currently borderline hypotensive -See A. fib with RVR, and chronic systolic CHF  COPD/COPD exacerbation -Titrate O2 to maintain SPO2 89-93% -DuoNeb QID -Solu-Medrol 60 mg daily -Azithromycin + ceftriaxone -Empiric Tamiflu -Flutter valve -Recent and DM -Influenza panel  pending -Sputum culture pending -Legionella urine antigen pending -Strep pneumo urine antigen pending  Acute on chronic respiratory failure  -See COPD exacerbation  Leukocytosis -Most likely multifactorial to include COPD exacerbation, cellulitis, and DVT?  Cellulitis -Continue vancomycin -Blood cultures pending -LLE wound care consult placed for A.m.; have removed old bandages  DVT -Doppler RLE pending  GI bleed? -Obtain occult blood -Type and cross    Code Status: Full Family Communication: None Disposition Plan: Resolution multiple medical problems  Time spent: 75 minutes  Allie Bossier [facility] Pager [phone]  If 7PM-7AM, please contact  night-coverage [email or web address] Password [facility] 12/11/2014, 10:26 PM

## 2014-12-11 NOTE — ED Notes (Signed)
Patient transported to X-ray 

## 2014-12-12 DIAGNOSIS — Z9229 Personal history of other drug therapy: Secondary | ICD-10-CM

## 2014-12-12 DIAGNOSIS — I509 Heart failure, unspecified: Secondary | ICD-10-CM

## 2014-12-12 DIAGNOSIS — L02419 Cutaneous abscess of limb, unspecified: Secondary | ICD-10-CM

## 2014-12-12 DIAGNOSIS — J9610 Chronic respiratory failure, unspecified whether with hypoxia or hypercapnia: Secondary | ICD-10-CM

## 2014-12-12 DIAGNOSIS — N189 Chronic kidney disease, unspecified: Secondary | ICD-10-CM

## 2014-12-12 DIAGNOSIS — R778 Other specified abnormalities of plasma proteins: Secondary | ICD-10-CM

## 2014-12-12 DIAGNOSIS — J9611 Chronic respiratory failure with hypoxia: Secondary | ICD-10-CM

## 2014-12-12 DIAGNOSIS — L03119 Cellulitis of unspecified part of limb: Secondary | ICD-10-CM

## 2014-12-12 DIAGNOSIS — R7989 Other specified abnormal findings of blood chemistry: Secondary | ICD-10-CM

## 2014-12-12 DIAGNOSIS — N179 Acute kidney failure, unspecified: Secondary | ICD-10-CM

## 2014-12-12 DIAGNOSIS — I5033 Acute on chronic diastolic (congestive) heart failure: Secondary | ICD-10-CM

## 2014-12-12 LAB — COMPREHENSIVE METABOLIC PANEL
ALBUMIN: 3.6 g/dL (ref 3.5–5.2)
ALT: 70 U/L — ABNORMAL HIGH (ref 0–35)
ANION GAP: 10 (ref 5–15)
AST: 66 U/L — ABNORMAL HIGH (ref 0–37)
Alkaline Phosphatase: 87 U/L (ref 39–117)
BUN: 37 mg/dL — AB (ref 6–23)
CO2: 24 mmol/L (ref 19–32)
CREATININE: 1.33 mg/dL — AB (ref 0.50–1.10)
Calcium: 8.5 mg/dL (ref 8.4–10.5)
Chloride: 103 mmol/L (ref 96–112)
GFR calc non Af Amer: 40 mL/min — ABNORMAL LOW (ref >90)
GFR, EST AFRICAN AMERICAN: 46 mL/min — AB (ref >90)
Glucose, Bld: 124 mg/dL — ABNORMAL HIGH (ref 70–99)
Potassium: 4.9 mmol/L (ref 3.5–5.1)
Sodium: 137 mmol/L (ref 135–145)
TOTAL PROTEIN: 6.8 g/dL (ref 6.0–8.3)
Total Bilirubin: 0.9 mg/dL (ref 0.3–1.2)

## 2014-12-12 LAB — IRON AND TIBC
IRON: 14 ug/dL — AB (ref 42–145)
SATURATION RATIOS: 4 % — AB (ref 20–55)
TIBC: 319 ug/dL (ref 250–470)
UIBC: 305 ug/dL (ref 125–400)

## 2014-12-12 LAB — TYPE AND SCREEN
ABO/RH(D): O NEG
ANTIBODY SCREEN: NEGATIVE

## 2014-12-12 LAB — VITAMIN B12: Vitamin B-12: 484 pg/mL (ref 211–911)

## 2014-12-12 LAB — CBC WITH DIFFERENTIAL/PLATELET
BASOS PCT: 0 % (ref 0–1)
Basophils Absolute: 0 10*3/uL (ref 0.0–0.1)
EOS ABS: 0 10*3/uL (ref 0.0–0.7)
Eosinophils Relative: 0 % (ref 0–5)
HCT: 29.2 % — ABNORMAL LOW (ref 36.0–46.0)
Hemoglobin: 8.8 g/dL — ABNORMAL LOW (ref 12.0–15.0)
Lymphocytes Relative: 3 % — ABNORMAL LOW (ref 12–46)
Lymphs Abs: 0.4 10*3/uL — ABNORMAL LOW (ref 0.7–4.0)
MCH: 28 pg (ref 26.0–34.0)
MCHC: 30.1 g/dL (ref 30.0–36.0)
MCV: 93 fL (ref 78.0–100.0)
MONO ABS: 0.4 10*3/uL (ref 0.1–1.0)
MONOS PCT: 3 % (ref 3–12)
Neutro Abs: 13.3 10*3/uL — ABNORMAL HIGH (ref 1.7–7.7)
Neutrophils Relative %: 94 % — ABNORMAL HIGH (ref 43–77)
Platelets: 209 10*3/uL (ref 150–400)
RBC: 3.14 MIL/uL — ABNORMAL LOW (ref 3.87–5.11)
RDW: 16.3 % — AB (ref 11.5–15.5)
WBC: 14.1 10*3/uL — ABNORMAL HIGH (ref 4.0–10.5)

## 2014-12-12 LAB — TROPONIN I
TROPONIN I: 0.05 ng/mL — AB (ref <0.031)
Troponin I: 0.05 ng/mL — ABNORMAL HIGH (ref <0.031)

## 2014-12-12 LAB — RETICULOCYTES
RBC.: 3.17 MIL/uL — ABNORMAL LOW (ref 3.87–5.11)
Retic Count, Absolute: 104.6 10*3/uL (ref 19.0–186.0)
Retic Ct Pct: 3.3 % — ABNORMAL HIGH (ref 0.4–3.1)

## 2014-12-12 LAB — INFLUENZA PANEL BY PCR (TYPE A & B)
H1N1 flu by pcr: NOT DETECTED
Influenza A By PCR: NEGATIVE
Influenza B By PCR: NEGATIVE

## 2014-12-12 LAB — PROTIME-INR
INR: 2.87 — ABNORMAL HIGH (ref 0.00–1.49)
PROTHROMBIN TIME: 30.3 s — AB (ref 11.6–15.2)

## 2014-12-12 LAB — ABO/RH: ABO/RH(D): O NEG

## 2014-12-12 LAB — FERRITIN: Ferritin: 58 ng/mL (ref 10–291)

## 2014-12-12 LAB — FOLATE: Folate: 11.5 ng/mL

## 2014-12-12 LAB — STREP PNEUMONIAE URINARY ANTIGEN: STREP PNEUMO URINARY ANTIGEN: NEGATIVE

## 2014-12-12 MED ORDER — FUROSEMIDE 10 MG/ML IJ SOLN
40.0000 mg | Freq: Once | INTRAMUSCULAR | Status: AC
  Administered 2014-12-12: 40 mg via INTRAVENOUS
  Filled 2014-12-12: qty 4

## 2014-12-12 MED ORDER — IPRATROPIUM-ALBUTEROL 0.5-2.5 (3) MG/3ML IN SOLN: 3.0000 mL | RESPIRATORY_TRACT | Status: DC

## 2014-12-12 MED ORDER — IPRATROPIUM-ALBUTEROL 0.5-2.5 (3) MG/3ML IN SOLN
3.0000 mL | Freq: Three times a day (TID) | RESPIRATORY_TRACT | Status: DC
  Administered 2014-12-12 – 2014-12-16 (×11): 3 mL via RESPIRATORY_TRACT
  Filled 2014-12-12 (×10): qty 3

## 2014-12-12 MED ORDER — VANCOMYCIN HCL 10 G IV SOLR
1250.0000 mg | INTRAVENOUS | Status: DC
  Administered 2014-12-12 – 2014-12-13 (×2): 1250 mg via INTRAVENOUS
  Filled 2014-12-12 (×3): qty 1250

## 2014-12-12 MED ORDER — CETYLPYRIDINIUM CHLORIDE 0.05 % MT LIQD
7.0000 mL | Freq: Two times a day (BID) | OROMUCOSAL | Status: DC
  Administered 2014-12-12 – 2014-12-16 (×9): 7 mL via OROMUCOSAL

## 2014-12-12 MED ORDER — OSELTAMIVIR PHOSPHATE 30 MG PO CAPS
30.0000 mg | ORAL_CAPSULE | Freq: Two times a day (BID) | ORAL | Status: DC
  Administered 2014-12-12: 30 mg via ORAL
  Filled 2014-12-12: qty 1

## 2014-12-12 MED ORDER — FUROSEMIDE 10 MG/ML IJ SOLN
40.0000 mg | Freq: Every day | INTRAMUSCULAR | Status: DC
  Administered 2014-12-13: 40 mg via INTRAVENOUS
  Filled 2014-12-12: qty 4

## 2014-12-12 MED ORDER — RIVAROXABAN 20 MG PO TABS
20.0000 mg | ORAL_TABLET | Freq: Every day | ORAL | Status: DC
  Administered 2014-12-12 – 2014-12-13 (×2): 20 mg via ORAL
  Filled 2014-12-12 (×3): qty 1

## 2014-12-12 NOTE — Progress Notes (Signed)
  Echocardiogram 2D Echocardiogram has been performed.  Caitlin Hampton 12/12/2014, 9:27 AM

## 2014-12-12 NOTE — Progress Notes (Signed)
[facility]  Patient Status: Active (receiving services up to time of hospitalization)  [facility] is providing the following services: RN  If patient discharges after hours, please call [phone].   Caitlin Hampton 12/12/2014, 4:24 PM

## 2014-12-12 NOTE — Progress Notes (Signed)
ANTIBIOTIC CONSULT NOTE - INITIAL  Pharmacy Consult for vancomycin, ceftriaxone Indication: PNA, Cellulitis  No Known Allergies  Patient Measurements: Height: 5\' 4"  (162.6 cm) Weight: 260 lb 2.3 oz (118 kg) IBW/kg (Calculated) : 54.7 Adjusted Body Weight:   Vital Signs: Temp: 97.6 F (36.4 C) (03/21 0450) Temp Source: Oral (03/21 0450) BP: 154/57 mmHg (03/21 0450) Pulse Rate: 51 (03/21 0450) Intake/Output from previous day: 03/20 0701 - 03/21 0700 In: 1680 [P.O.:180; I.V.:1200; IV Piggyback:300] Out: 465 [Urine:465] Intake/Output from this shift: Total I/O In: 480 [P.O.:180; IV Piggyback:300] Out: 365 [Urine:365]  Labs:  Recent Labs  12/11/14 1625  WBC 18.1*  HGB 8.9*  PLT 261  CREATININE 1.48*   Estimated Creatinine Clearance: 45.3 mL/min (by C-G formula based on Cr of 1.48). No results for input(s): VANCOTROUGH, VANCOPEAK, VANCORANDOM, GENTTROUGH, GENTPEAK, GENTRANDOM, TOBRATROUGH, TOBRAPEAK, TOBRARND, AMIKACINPEAK, AMIKACINTROU, AMIKACIN in the last 72 hours.   Microbiology: No results found for this or any previous visit (from the past 720 hour(s)).  Medical History: Past Medical History  Diagnosis Date  . Hypertension   . Thyroid disease   . COPD (chronic obstructive pulmonary disease)   . Anxiety   . H/O hiatal hernia   . Hypothyroidism   . Shortness of breath   . Atrial flutter 09/07/2012    , RVR/ hospital 12/13- rate control and Echo normal- on xarelto, when spontaneously converted she had bradycardia on Metoprolol and Digoxin  . GERD (gastroesophageal reflux disease)   . Rhinitis   . Parotitis     , on CT of face- 12/13  . CHF (congestive heart failure)   . Hx of echocardiogram     Echo (8/15):  EF 60-65%  . NSTEMI (non-ST elevated myocardial infarction)     Medications:  Anti-infectives    Start     Dose/Rate Route Frequency Ordered Stop   12/12/14 1200  vancomycin (VANCOCIN) 1,250 mg in sodium chloride 0.9 % 250 mL IVPB     1,250  mg 166.7 mL/hr over 90 Minutes Intravenous Every 24 hours 12/12/14 0559     12/11/14 2333  oseltamivir (TAMIFLU) capsule 75 mg     75 mg Oral 2 times daily 12/11/14 2333 12/16/14 2159   12/11/14 2230  azithromycin (ZITHROMAX) 500 mg in dextrose 5 % 250 mL IVPB     500 mg 250 mL/hr over 60 Minutes Intravenous Daily at bedtime 12/11/14 2213     12/11/14 2230  cefTRIAXone (ROCEPHIN) 1 g in dextrose 5 % 50 mL IVPB - Premix     1 g 100 mL/hr over 30 Minutes Intravenous Daily at bedtime 12/11/14 2216     12/11/14 2200  vancomycin (VANCOCIN) IVPB 1000 mg/200 mL premix  Status:  Discontinued     1,000 mg 200 mL/hr over 60 Minutes Intravenous  Once 12/11/14 2148 12/11/14 2153   12/11/14 1830  vancomycin (VANCOCIN) IVPB 1000 mg/200 mL premix     1,000 mg 200 mL/hr over 60 Minutes Intravenous  Once 12/11/14 1823 12/11/14 1951     Assessment: Patient with LLE wound/cellulitis, COPD exac., r/o PNA.  First dose of antibiotics already given in ED.  Goal of Therapy:  Vancomycin trough level 15-20 mcg/ml  Rocephin based on manufacturer dosing recommendations.  Plan:  Measure antibiotic drug levels at steady state Follow up culture results Vancomycin 1250mg  iv q24hr  Ceftriaxone 1gm iv q24hr  Nani Skillern Crowford 12/12/2014,6:00 AM

## 2014-12-12 NOTE — Consult Note (Signed)
WOC wound consult note Reason for Consult:Traumatic wound to LLE Wound type:Traumatic Pressure Ulcer POA: No Measurement: Two wounds:  Proximal measures 3cm x 2cm x 0.5cm with 60% yellow stringy wound base and 40% light pink wound base,  Distal wound is 100% pink and moist and measures 2cm x 1cm x 0.2cm. Injured area measures 12cm x 5cm on the left pretibial area. Wound bed:As described above. Drainage (amount, consistency, odor) serous drainage in a small amount Periwound: Mild ecchymosis and resolving erythema (blanchable).  No warmth or induration. Dressing procedure/placement/frequency: I will implement a twice daily NS dressing with white petrolatum dressings to cover so that the dressings do not dry our and adhere. This will remove any necrotic slough from the proximal wound via autolysis and provide a moisture retentive dressing for wound and tissue repair. [place] nursing team will not follow, but will remain available to this patient, the nursing and medical team.  Please re-consult if needed. Thanks, Maudie Flakes, MSN, RN, [place], [place], [place] [phone])

## 2014-12-12 NOTE — Progress Notes (Addendum)
CARDIOLOGY CONSULT NOTE   Patient ID: Caitlin Hampton MRN: [id number] DOB/AGE: 1946-07-24 69 y.o.  Admit Date: 12/11/2014  Primary Physician: [name], MD  Primary Cardiologist     [facility]   Clinical Summary Caitlin Hampton is a 68 y.o.female. She is admitted with weakness. She does have a cardiac history. There is history of atrial flutter. She has been anticoagulated. She is on amiodarone. This was held on admission because of bradycardia. There is question on the monitor concerning her current rhythm. However the EKG today shows sinus rhythm with sinus bradycardia. Her chest x-ray is mildly volume overloaded. In the hospital amiodarone had been held. Diltiazem had been continued until it was stopped this morning.Marland Kitchen BNP is elevated. There is very slight elevation of troponin. Two-dimensional echo this admission reveals an ejection fraction of 65%..   No Known Allergies  Medications Scheduled Medications: . sodium chloride   Intravenous Once  . antiseptic oral rinse  7 mL Mouth Rinse BID  . aspirin EC  81 mg Oral Daily  . atorvastatin  10 mg Oral Daily  . ipratropium-albuterol  3 mL Nebulization TID  . levothyroxine  100 mcg Oral QAC breakfast  . oseltamivir  30 mg Oral BID  . rivaroxaban  20 mg Oral Q supper  . sodium chloride  3 mL Intravenous Q12H  . vancomycin  1,250 mg Intravenous Q24H     Infusions: . sodium chloride 1,000 mL (12/11/14 1716)  . sodium chloride 75 mL/hr at 12/12/14 0006     PRN Medications:  sodium chloride, acetaminophen, albuterol, ondansetron (ZOFRAN) IV, sodium chloride   Past Medical History  Diagnosis Date  . Hypertension   . Thyroid disease   . COPD (chronic obstructive pulmonary disease)   . Anxiety   . H/O hiatal hernia   . Hypothyroidism   . Shortness of breath   . Atrial flutter 09/07/2012    , RVR/ hospital 12/13- rate control and Echo normal- on xarelto, when spontaneously converted she had bradycardia on Metoprolol and Digoxin   . GERD (gastroesophageal reflux disease)   . Rhinitis   . Parotitis     , on CT of face- 12/13  . CHF (congestive heart failure)   . Hx of echocardiogram     Echo (8/15):  EF 60-65%  . NSTEMI (non-ST elevated myocardial infarction)     Past Surgical History  Procedure Laterality Date  . Tonsillectomy    . Breast surgery      benign R brest CA  . Cardioversion N/A 04/02/2014    Procedure: CARDIOVERSION;  Surgeon: Josue Hector, MD;  Location: [place];  Service: Cardiovascular;  Laterality: N/A;  . Cardioversion N/A 08/17/2014    Procedure: CARDIOVERSION;  Surgeon: [name], MD;  Location[facility] ENDOSCOPY;  Service: Cardiovascular;  Laterality: N/A;    Family History  Problem Relation Age of Onset  . CAD Mother   . Stroke Mother   . Prostate cancer Father   . Dementia Father     Social History Caitlin Hampton reports that she quit smoking about 2 years ago. Her smoking use included Cigarettes. She smoked 1.50 packs per day. She does not have any smokeless tobacco history on file. [name] reports that she does not drink alcohol.  Review of Systems Patient denies fever, chills, headache, sweats, rash, change in vision, change in hearing, chest pain, nausea or vomiting, urinary symptoms. All other systems are reviewed and are negative.  Physical Examination Blood pressure 154/57, pulse 51, temperature  97.6 F (36.4 C), temperature source Oral, resp. rate 28, height 5\' 4"  (1.626 m), weight 260 lb 2.3 oz (118 kg), SpO2 93 %.  Intake/Output Summary (Last 24 hours) at 12/12/14 1215 Last data filed at 12/12/14 0819  Gross per 24 hour  Intake 2317.5 ml  Output    665 ml  Net 1652.5 ml   The patient is overweight. She is in bed. Her sister is in the room. Head is atraumatic. Sclera and conjunctiva are normal. There is no jugulovenous distention. Lung exam reveals rhonchi. There is no respiratory distress. She is using oxygen. Cardiac exam reveals an S1 and S2. The rhythm is slow.  Abdomen is soft. She has skin lesions on both legs. There is mild edema.  Prior Cardiac Testing/Procedures  Lab Results  Basic Metabolic Panel:  Recent Labs Lab 12/11/14 1625 12/11/14 1958 12/12/14 0812  NA 139  --  137  K 4.8  --  4.9  CL 105  --  103  CO2 24  --  24  GLUCOSE 112*  --  124*  BUN 39*  --  37*  CREATININE 1.48*  --  1.33*  CALCIUM 8.6  --  8.5  MG  --  2.6*  --     Liver Function Tests:  Recent Labs Lab 12/11/14 1625 12/12/14 0812  AST 36 66*  ALT 67* 70*  ALKPHOS 88 87  BILITOT 1.1 0.9  PROT 6.7 6.8  ALBUMIN 3.7 3.6    CBC:  Recent Labs Lab 12/11/14 1625 12/12/14 0812  WBC 18.1* 14.1*  NEUTROABS  --  13.3*  HGB 8.9* 8.8*  HCT 30.0* 29.2*  MCV 93.5 93.0  PLT 261 209    Cardiac Enzymes:  Recent Labs Lab 12/11/14 1958 12/12/14 0115 12/12/14 0800  TROPONINI 0.05* 0.05* 0.05*    BNP: Invalid input(s): POCBNP   Radiology: Dg Chest 2 View  12/11/2014   CLINICAL DATA:  Right leg pain and weakness.  EXAM: CHEST  2 VIEW  COMPARISON:  03/31/2014 and prior radiographs  FINDINGS: Cardiomegaly and pulmonary vascular congestion identified.  Left hemidiaphragm elevation again noted.  There is no evidence of focal airspace disease, pulmonary edema, suspicious pulmonary nodule/mass, pleural effusion, or pneumothorax. No acute bony abnormalities are identified.  IMPRESSION: Cardiomegaly with pulmonary vascular congestion.   Electronically Signed   By: Margarette Canada M.D.   On: 12/11/2014 16:42     ECG: I reviewed her current EKGs and her prior EKGs. The first EKG this admission does show sinus rhythm with sinus bradycardia. The second EKG reveals significant artifact. Most probably there is underlying sinus rhythm. On the monitor today, her rhythm appears to be stable.   Impression and Recommendations     History of atrial flutter. The patient was on amiodarone 400 mg daily. It was held on admission.. As of today I think she is in sinus rhythm  with significant sinus bradycardia.  I'm hopeful that we'll be able to restart her amiodarone at a lower dose. I've chosen not to do this today until we watch her heart rate off diltiazem.. It is possible that bradycardia is playing a role with her overall symptomatology.    Respiratory failure requiring O2, inhalers   COPD (chronic obstructive pulmonary disease)   Hypothyroidism      Chronic systolic CHF (congestive heart failure)     I do believe that there is mild volume overload at this time. It is possible that her bradycardia may be making this worse. She  has normal systolic function. She has significant diastolic dysfunction. I feel that she should be placed on a daily diuretic watching her renal function. I'm hoping that heart rate increase may help her situation.     Cellulitis of right lower extremity     This is being treated.    Sinus bradycardia      Today she has sinus bradycardia. I hope that reduction of her meds will help with this.    Elevated troponin     At this point I suspect her troponin is related to her CHF and elevated BNP. No further evaluation.     Acute on chronic renal failure     We will need to follow her renal function carefully with an attempted mild diuresis.    HX: anticoagulation    The patient is anticoagulated and this will need to be continued.  The overall situation is complex from the cardiac viewpoint. For now I will try to adjust the medicines to see if her heart rate can increase. I will also resume her diuretics.    Daryel November, MD 12/12/2014, 12:15 PM

## 2014-12-12 NOTE — Progress Notes (Addendum)
PROGRESS NOTE  Caitlin Hampton JKD:[id number] DOB: [birth year] DOA: 12/11/2014 PCP: [name], MD  Brief history 69 year old female with a history of atrial fibrillation on Xarelto, hypertension, diastolic CHF, chronic respiratory failure on 3 L nasal cannula at home, COPD presented with a mechanical fall after her right leg gave out. Apparently she got tangled on her oxygen cord and had difficulty getting up. At baseline, the patient uses a walker for ambulation, but she has had 3 mechanical falls within the past 3 months. In addition, the patient has been complaining of increasing dyspnea on exertion for the past 2 weeks. She has been having difficulty even getting dressed without dyspnea on exertion. She denies any fevers, chills, hemoptysis, nausea, vomiting, diarrhea. She states that 3 days prior to this admission, her diltiazem dose was decreased from twice a day to once daily dosing. She has at least a 60-pack-year history of tobacco. Approximately one month ago, the patient hit her left leg on her wheelchair resulting in a laceration. She has been getting home health wound care as well as going to the wound care center. She has noted increasing drainage although not purulent from her left lower extremity.  Assessment/Plan: Acute on chronic diastolic CHF -The patient appears clinically volume overloaded -Discontinue IV fluids -Judicious furosemide IV--give 40mg  IV x 1 and re-eval in 24hr -Daily weights -Repeat echocardiogram -Influenza PCR--neg-->d/c tamiflu Bradycardia -Discontinue diltiazem -Amiodarone was held with the time of admission -Consult cardiology to help adjustment of the patient's antiarrhythmic medications COPD -Discontinue steroids -Bronchodilators when necessary -Discontinue azithromycin -Oxygen supplementation to keep oxygen saturation greater than 92%   Atrial fibrillation -Rate controlled--actually bradycardic -Continue rivaroxaban although I have  concerns given the patient's recent falls--await cardiology opinion Elevated troponin -Demand ischemia in the setting of worsening renal function and decompensated heart failure -No chest pain presently -EKG without any ST-T wave change Anemia  -Fecal occult blood test  -Hemoglobin has dropped 4 g since 08/09/2014  -Check anemia panel Lower extremity pain and edema  -Venous duplex to rule out DVT Left lower extremity wound--mild cellulitis -Wound care nurse consultation  -continue on vanco -d/c ceftriaxone and azithromycin Acute on chronic renal failure (CKD2) -Baseline creatinine 0.8-1.1 Deconditioning -PT eval   Family Communication:   Sister updated at beside Disposition Plan:   SNF in few days       Procedures/Studies: Dg Chest 2 View  12/11/2014   CLINICAL DATA:  Right leg pain and weakness.  EXAM: CHEST  2 VIEW  COMPARISON:  03/31/2014 and prior radiographs  FINDINGS: Cardiomegaly and pulmonary vascular congestion identified.  Left hemidiaphragm elevation again noted.  There is no evidence of focal airspace disease, pulmonary edema, suspicious pulmonary nodule/mass, pleural effusion, or pneumothorax. No acute bony abnormalities are identified.  IMPRESSION: Cardiomegaly with pulmonary vascular congestion.   Electronically Signed   By: Margarette Canada M.D.   On: 12/11/2014 16:42        Subjective: Patient complains of some shortness of breath without any chest pain. Denies any fevers, chills, nausea, vomiting, diarrhea, abdominal pain, medication, melena, dysuria, hematuria. No rashes.   Objective: Filed Vitals:   12/11/14 1915 12/11/14 2031 12/12/14 0450 12/12/14 0941  BP: 122/39 112/47 154/57   Pulse: 43 45 51   Temp:  97.9 F (36.6 C) 97.6 F (36.4 C)   TempSrc:  Oral Oral   Resp: 23 26 28    Height:  5\' 4"  (1.626 m)    Weight:  117 kg (257 lb 15 oz) 118 kg (260 lb 2.3 oz)   SpO2: 94% 95% 96% 89%    Intake/Output Summary (Last 24 hours) at 12/12/14 1004 Last  data filed at 12/12/14 0819  Gross per 24 hour  Intake 2317.5 ml  Output    665 ml  Net 1652.5 ml   Weight change:  Exam:   General:  Pt is alert, follows commands appropriately, not in acute distress  HEENT: No icterus, No thrush, [place]/AT  Cardiovascular: RRR, S1/S2, no rubs, no gallops; +JVD  Respiratory: Bibasilar crackles. No wheeze   Abdomen: Soft/+BS, non tender, non distended, no guarding  Extremities: 2+ LE edema, No lymphangitis, No petechiae, No rashes, no synovitis; left pretibial wound with yellow wound base without necrosis with mild erythema surrounding the wound  Data Reviewed: Basic Metabolic Panel:  Recent Labs Lab 12/11/14 1625 12/11/14 1958 12/12/14 0812  NA 139  --  137  K 4.8  --  4.9  CL 105  --  103  CO2 24  --  24  GLUCOSE 112*  --  124*  BUN 39*  --  37*  CREATININE 1.48*  --  1.33*  CALCIUM 8.6  --  8.5  MG  --  2.6*  --    Liver Function Tests:  Recent Labs Lab 12/11/14 1625 12/12/14 0812  AST 36 66*  ALT 67* 70*  ALKPHOS 88 87  BILITOT 1.1 0.9  PROT 6.7 6.8  ALBUMIN 3.7 3.6   No results for input(s): LIPASE, AMYLASE in the last 168 hours. No results for input(s): AMMONIA in the last 168 hours. CBC:  Recent Labs Lab 12/11/14 1625 12/12/14 0812  WBC 18.1* 14.1*  NEUTROABS  --  13.3*  HGB 8.9* 8.8*  HCT 30.0* 29.2*  MCV 93.5 93.0  PLT 261 209   Cardiac Enzymes:  Recent Labs Lab 12/11/14 1958 12/12/14 0115 12/12/14 0800  TROPONINI 0.05* 0.05* 0.05*   BNP: Invalid input(s): POCBNP CBG: No results for input(s): GLUCAP in the last 168 hours.  No results found for this or any previous visit (from the past 240 hour(s)).   Scheduled Meds: . sodium chloride   Intravenous Once  . antiseptic oral rinse  7 mL Mouth Rinse BID  . aspirin EC  81 mg Oral Daily  . atorvastatin  10 mg Oral Daily  . furosemide  40 mg Intravenous Once  . ipratropium-albuterol  3 mL Nebulization QID  . levothyroxine  100 mcg Oral QAC  breakfast  . oseltamivir  30 mg Oral BID  . sodium chloride  3 mL Intravenous Q12H  . vancomycin  1,250 mg Intravenous Q24H   Continuous Infusions: . sodium chloride 1,000 mL (12/11/14 1716)  . sodium chloride 75 mL/hr at 12/12/14 0006     Daeveon Zweber, DO  [facility] Pager [phone]  If 7PM-7AM, please contact night-coverage [email or web address] Password [email or web address] 12/12/2014, 10:04 AM   LOS: 1 day

## 2014-12-12 NOTE — Progress Notes (Addendum)
VASCULAR LAB PRELIMINARY  PRELIMINARY  PRELIMINARY  PRELIMINARY  Right lower extremity venous Doppler completed.    Preliminary report:  There is no obvious evidence of DVT or SVT noted in the bilateral lower extremities.  Doppler waveforms are pulsatile, suggestive of fluid overload.   Jeyren Danowski, RVT 12/12/2014, 9:48 AM

## 2014-12-12 NOTE — Care Management Note (Addendum)
    Page 1 of 1   12/15/2014     1:27:02 PM CARE MANAGEMENT NOTE 12/15/2014  Patient:  Caitlin Hampton, Caitlin Hampton   Account Number:  [id number]  Date Initiated:  12/12/2014  Documentation initiated by:  Dessa Phi  Subjective/Objective Assessment:   69 y/o f admitted w/CHF.LLE wound.     Action/Plan:   From home.Active w/[facility] dme-Home 02.   Anticipated DC Date:  12/17/2014   Anticipated DC Plan:  [place]  CM consult      Choice offered to / List presented to:             Status of service:  In process, will continue to follow Medicare Important Message given?  YES (If response is "NO", the following Medicare IM given date fields will be blank) Date Medicare IM given:  12/15/2014 Medicare IM given by:  [facility] Date Additional Medicare IM given:   Additional Medicare IM given by:    Discharge Disposition:    Per UR Regulation:  Reviewed for med. necessity/level of care/duration of stay  If discussed at [place] of Stay Meetings, dates discussed:   12/15/2014    Comments:  12/15/14 Dessa Phi RN BSN NCM [phone] CHF-improving, desating.02,nebs. D/C to  SNFwhen stable.  12/12/14 Dessa Phi RN BSN NCM [phone] [facility] active w/[facility]N-Kristen rep aware & following for [facility] order.

## 2014-12-13 ENCOUNTER — Inpatient Hospital Stay (HOSPITAL_COMMUNITY): Payer: Medicare Other

## 2014-12-13 DIAGNOSIS — J9610 Chronic respiratory failure, unspecified whether with hypoxia or hypercapnia: Secondary | ICD-10-CM

## 2014-12-13 DIAGNOSIS — I5033 Acute on chronic diastolic (congestive) heart failure: Secondary | ICD-10-CM

## 2014-12-13 LAB — BLOOD GAS, ARTERIAL
ACID-BASE EXCESS: 1.2 mmol/L (ref 0.0–2.0)
Bicarbonate: 26.9 mEq/L — ABNORMAL HIGH (ref 20.0–24.0)
Drawn by: 257701
FIO2: 1 %
O2 Saturation: 44 %
PH ART: 7.334 — AB (ref 7.350–7.450)
Patient temperature: 98.6
TCO2: 26 mmol/L (ref 0–100)
pCO2 arterial: 51.9 mmHg — ABNORMAL HIGH (ref 35.0–45.0)

## 2014-12-13 LAB — BASIC METABOLIC PANEL
Anion gap: 11 (ref 5–15)
BUN: 43 mg/dL — AB (ref 6–23)
CALCIUM: 8.5 mg/dL (ref 8.4–10.5)
CO2: 25 mmol/L (ref 19–32)
CREATININE: 1.51 mg/dL — AB (ref 0.50–1.10)
Chloride: 102 mmol/L (ref 96–112)
GFR calc Af Amer: 40 mL/min — ABNORMAL LOW (ref >90)
GFR, EST NON AFRICAN AMERICAN: 34 mL/min — AB (ref >90)
GLUCOSE: 85 mg/dL (ref 70–99)
Potassium: 4.5 mmol/L (ref 3.5–5.1)
SODIUM: 138 mmol/L (ref 135–145)

## 2014-12-13 LAB — LEGIONELLA ANTIGEN, URINE

## 2014-12-13 LAB — MAGNESIUM: Magnesium: 2.7 mg/dL — ABNORMAL HIGH (ref 1.5–2.5)

## 2014-12-13 MED ORDER — FUROSEMIDE 10 MG/ML IJ SOLN
40.0000 mg | Freq: Once | INTRAMUSCULAR | Status: AC
  Administered 2014-12-13: 40 mg via INTRAVENOUS
  Filled 2014-12-13: qty 4

## 2014-12-13 NOTE — Progress Notes (Signed)
Clinical Social Work Department BRIEF PSYCHOSOCIAL ASSESSMENT 12/13/2014  Patient:  Caitlin Hampton, Caitlin Hampton     Account Number:  [id number]     Admit date:  12/11/2014  Clinical Social Worker:  Glorious Peach, CLINICAL SOCIAL WORKER  Date/Time:  12/13/2014 01:57 PM  Referred by:  Physician  Date Referred:  12/13/2014 Referred for  SNF Placement   Other Referral:   Interview type:  Patient Other interview type:    PSYCHOSOCIAL DATA Living Status:  ALONE Admitted from facility:   Level of care:   Primary support name:  [name] Primary support relationship to patient:  SIBLING Degree of support available:   Adequate    CURRENT CONCERNS Current Concerns  Post-Acute Placement   Other Concerns:    SOCIAL WORK ASSESSMENT / PLAN CSW received consult from PT recommending pt goes to SNF for short term rehab. CSW to inform pt of recommendation.   Assessment/plan status:  Information/Referral to [employer] Other assessment/ plan:   Information/referral to community resources:   CSW informed pt of PT recommendation for SNF for short term rehab. CSW completed FL2, and will fax information to [facility].    PATIENT'S/FAMILY'S RESPONSE TO PLAN OF CARE: CSW met with patient at bedside, introduced self and explained role. CSW informed pt about PT recommendation for short term rehab at [facility]. Pt was a bit confused at first stating that she had not did any walking and PT couldn't work with her because of her vitals. CSW informed pt that PT could have thought that it was best that she received some intense rehab at a facility, since they couldn't get her to get up and walk here. Pt states that she does not have 24 hour care at home and does not want to put it all on her sister, Lelon Frohlich. Pt also states that they have dealt with Home Health in the past and will probably utilize it when she returns home. CSW explained the importance of the 24 hour supervision, being that when pt may have to use  the restroom in the middle of the night, she will need assistance. Pt agrees and is open to shirt tern rehab at a SNF. Pt states that she has not been to a SNF in the past but after seeing the list that CSW gave her, states that [name] close [facility] her home and will have her sister visit a few others to see which one is best fit for her. CSW will fax out needed clinicals to [facility] for bed offers and follow up with pt when bed offers are received.       Glorious Peach BSW Intern

## 2014-12-13 NOTE — Progress Notes (Signed)
Assumed care of patient at 23:30.  I agree with prior assessment and will continue to monitor patient. [name], Caitlin Hampton Cherie

## 2014-12-13 NOTE — Progress Notes (Signed)
Clinical Social Work Department CLINICAL SOCIAL WORK PLACEMENT NOTE 12/13/2014  Patient:  Caitlin Hampton, Caitlin Hampton  Account Number:  [id number] Admit date:  12/11/2014  Clinical Social Worker:  Renold Genta  Date/time:  12/13/2014 03:23 PM  Clinical Social Work is seeking post-discharge placement for this patient at the following level of care:   SKILLED NURSING   (*CSW will update this form in Epic as items are completed)   12/13/2014  Patient/family provided with [place] [facility] Department of Clinical Social Work's list of facilities offering this level of care within the geographic area requested by the patient (or if unable, by the patient's family).  12/13/2014  Patient/family informed of their freedom to choose among providers that offer the needed level of care, that participate in Medicare, Medicaid or managed care program needed by the patient, have an available bed and are willing to accept the patient.  12/13/2014  Patient/family informed of [facility]' ownership interest in [facility], as well as of the fact that they are under no obligation to receive care at this facility.  PASARR submitted to EDS on 12/13/2014 PASARR number received on 12/13/2014  FL2 transmitted to all facilities in geographic area requested by pt/family on  12/13/2014 FL2 transmitted to all facilities within larger geographic area on   Patient informed that his/her managed care company has contracts with or will negotiate with  certain facilities, including the following:   [facility]     Patient/family informed of bed offers received:   Patient chooses bed at  Physician recommends and patient chooses bed at    Patient to be transferred to  on   Patient to be transferred to facility by  Patient and family notified of transfer on  Name of family member notified:    The following physician request were entered in Epic:   Additional Comments:    Raynaldo Opitz, [place] Social Worker cell #: [phone]

## 2014-12-13 NOTE — Progress Notes (Signed)
PROGRESS NOTE  Caitlin Hampton RAQ:[id number] DOB: 02/25/1946 DOA: 12/11/2014 PCP: [name], MD  Brief history 69 year old female with a history of atrial fibrillation on Xarelto, hypertension, diastolic CHF, chronic respiratory failure on 3 L nasal cannula at home, COPD presented with a mechanical fall after her right leg gave out. Apparently she got tangled on her oxygen cord and had difficulty getting up. At baseline, the patient uses a walker for ambulation, but she has had 3 mechanical falls within the past 3 months. In addition, the patient has been complaining of increasing dyspnea on exertion for the past 2 weeks. She has been having difficulty even getting dressed without dyspnea on exertion. She denies any fevers, chills, hemoptysis, nausea, vomiting, diarrhea. She states that 3 days prior to this admission, her diltiazem dose was decreased from twice a day to once daily dosing. She has at least a 60-pack-year history of tobacco. Approximately one month ago, the patient hit her left leg on her wheelchair resulting in a laceration. She has been getting home health wound care as well as going to the wound care center. She has noted increasing drainage although not purulent from her left lower extremity.  Assessment/Plan: Acute on chronic diastolic CHF -The patient appears clinically volume overloaded -Discontinue IV fluids -Judicious furosemide IV--give 40mg  IV daily -Daily weights--not sure that weights are correct -I/O not accurate -Repeat echocardiogram--EF 65-70%, no WMA,PAP = 60 -Influenza PCR--neg-->d/c tamiflu Bradycardia -improving -Discontinue diltiazem at time of admission -Amiodarone was held with the time of admission -Appreciate cardiology to help adjustment of the patient's antiarrhythmic medications Acute and chronic respiratory failure/COPD -secondary to CHF in setting of underlying COPD; normally on 3L at home -Discontinue steroids -Bronchodilators when  necessary -Discontinue azithromycin -12/13/14--pt desat but no distress;  ABG likely venous; Placed on NRB--> O2 sat 100%--wean to keep oxygen saturation greater than 92%  -12/13/14 repeat CXR--no much change in pulm edema and pt still fluid overloaded-->give extra dose furosemide IV Atrial fibrillation -Rate controlled--actually bradycardic -Continue rivaroxaban although I have concerns given the patient's recent falls--defer to cardiology Elevated troponin -Demand ischemia in the setting of worsening renal function and decompensated heart failure -No chest pain presently -EKG without any ST-T wave change Anemia  -Fecal occult blood test  -Hemoglobin has dropped 4 g since 08/09/2014  -Check anemia panel-->iron sat 4% with ferritin 58 -give IV dose iron and start po iron Lower extremity pain and edema  -Venous duplex to rule out DVT--neg Left lower extremity wound--mild cellulitis -Wound care nurse consultation appreciated -continue on vanco-->may be able to switch to doxy 3/23 if clinically stable -d/c ceftriaxone and azithromycin Acute on chronic renal failure (CKD2) -Baseline creatinine 0.8-1.1 Deconditioning -PT eval-->SNF  Family Communication:   Sister updated at beside 3/22 Disposition Plan:   SNF when stable        Procedures/Studies: Dg Chest 2 View  12/11/2014   CLINICAL DATA:  Right leg pain and weakness.  EXAM: CHEST  2 VIEW  COMPARISON:  03/31/2014 and prior radiographs  FINDINGS: Cardiomegaly and pulmonary vascular congestion identified.  Left hemidiaphragm elevation again noted.  There is no evidence of focal airspace disease, pulmonary edema, suspicious pulmonary nodule/mass, pleural effusion, or pneumothorax. No acute bony abnormalities are identified.  IMPRESSION: Cardiomegaly with pulmonary vascular congestion.   Electronically Signed   By: Margarette Canada M.D.   On: 12/11/2014 16:42   Dg Chest Port 1 View  12/13/2014   CLINICAL DATA:  Acute  respiratory  failure, hypertension, COPD, CHF, GERD  EXAM: PORTABLE CHEST - 1 VIEW  COMPARISON:  Portable exam 1650 hours compared to 12/11/2014  FINDINGS: Enlargement of cardiac silhouette with pulmonary vascular congestion.  Atherosclerotic calcification aorta.  Hazy interstitial infiltrates in both lungs question minimal failure.  Lordotic positioning.  No gross pleural effusion or pneumothorax.  IMPRESSION: Suspect mild CHF.   Electronically Signed   By: Lavonia Dana M.D.   On: 12/13/2014 17:22        Subjective: Patient states that breathing is much better yesterday but certainly no worse. Denies any fevers, chills, coughing, hemoptysis, nausea, vomiting, diarrhea, abdominal pain, dysuria, hematuria. No rashes.  Objective: Filed Vitals:   12/13/14 1210 12/13/14 1342 12/13/14 1406 12/13/14 1959  BP:  116/72    Pulse:  97    Temp:  97.6 F (36.4 C)    TempSrc:  Oral    Resp:  20    Height:      Weight:      SpO2: 94% 96% 90% 99%    Intake/Output Summary (Last 24 hours) at 12/13/14 2010 Last data filed at 12/13/14 1500  Gross per 24 hour  Intake 1447.5 ml  Output   1100 ml  Net  347.5 ml   Weight change: 3 kg (6 lb 9.8 oz) Exam:   General:  Pt is alert, follows commands appropriately, not in acute distress  HEENT: No icterus, No thrush, [place]/AT  Cardiovascular: RRR, S1/S2, no rubs, no gallops  Respiratory: Bilateral crackles. No wheeze.  Abdomen: Soft/+BS, non tender, non distended, no guarding  Extremities: 2+LE edema, No lymphangitis, No petechiae, No rashes, no synovitis  Data Reviewed: Basic Metabolic Panel:  Recent Labs Lab 12/11/14 1625 12/11/14 1958 12/12/14 0812  NA 139  --  137  K 4.8  --  4.9  CL 105  --  103  CO2 24  --  24  GLUCOSE 112*  --  124*  BUN 39*  --  37*  CREATININE 1.48*  --  1.33*  CALCIUM 8.6  --  8.5  MG  --  2.6*  --    Liver Function Tests:  Recent Labs Lab 12/11/14 1625 12/12/14 0812  AST 36 66*  ALT 67* 70*  ALKPHOS 88 87    BILITOT 1.1 0.9  PROT 6.7 6.8  ALBUMIN 3.7 3.6   No results for input(s): LIPASE, AMYLASE in the last 168 hours. No results for input(s): AMMONIA in the last 168 hours. CBC:  Recent Labs Lab 12/11/14 1625 12/12/14 0812  WBC 18.1* 14.1*  NEUTROABS  --  13.3*  HGB 8.9* 8.8*  HCT 30.0* 29.2*  MCV 93.5 93.0  PLT 261 209   Cardiac Enzymes:  Recent Labs Lab 12/11/14 1958 12/12/14 0115 12/12/14 0800  TROPONINI 0.05* 0.05* 0.05*   BNP: Invalid input(s): POCBNP CBG: No results for input(s): GLUCAP in the last 168 hours.  Recent Results (from the past 240 hour(s))  Culture, blood (routine x 2)     Status: None (Preliminary result)   Collection Time: 12/11/14  7:56 PM  Result Value Ref Range Status   Specimen Description BLOOD LEFT ANTECUBITAL  Final   Special Requests BOTTLES DRAWN AEROBIC AND ANAEROBIC 5CC  Final   Culture   Final           BLOOD CULTURE RECEIVED NO GROWTH TO DATE CULTURE WILL BE HELD FOR 5 DAYS BEFORE ISSUING A FINAL NEGATIVE REPORT Performed at [facility]    Report Status  PENDING  Incomplete  Culture, blood (routine x 2)     Status: None (Preliminary result)   Collection Time: 12/11/14  7:58 PM  Result Value Ref Range Status   Specimen Description BLOOD RIGHT HAND  Final   Special Requests BOTTLES DRAWN AEROBIC AND ANAEROBIC 5ML  Final   Culture   Final           BLOOD CULTURE RECEIVED NO GROWTH TO DATE CULTURE WILL BE HELD FOR 5 DAYS BEFORE ISSUING A FINAL NEGATIVE REPORT Performed at [facility]    Report Status PENDING  Incomplete     Scheduled Meds: . antiseptic oral rinse  7 mL Mouth Rinse BID  . atorvastatin  10 mg Oral Daily  . furosemide  40 mg Intravenous Daily  . furosemide  40 mg Intravenous Once  . ipratropium-albuterol  3 mL Nebulization TID  . levothyroxine  100 mcg Oral QAC breakfast  . rivaroxaban  20 mg Oral Q supper  . sodium chloride  3 mL Intravenous Q12H  . vancomycin  1,250 mg Intravenous Q24H    Continuous Infusions: . sodium chloride 1,000 mL (12/11/14 1716)     Cordaryl Decelles, DO  [facility] Pager [phone]  If 7PM-7AM, please contact night-coverage [email or web address] Password [email or web address] 12/13/2014, 8:10 PM   LOS: 2 days

## 2014-12-13 NOTE — Progress Notes (Signed)
At patient bedside to explain PICC procedure, risks, benefits and purpose with patient and her sister.  After much discussion, questions answered and bedside nurse at side, patient agreed to procedure, signed consent and requested to use bathroom prior to procedure.  Patient placed on bedpan and upon returning directly after being taken off bedpan, patient states that she does not wish to have PICC placed.  Explained to her that not having line placed could cause her to be stuck multiple times for labs and IV restarts including in her feet as well as not having timely lab work for her care.  Patient's sister asked her if she was sure and patient states again that she just doesn't want to have it placed.  Robert Bellow, RN updated and will notify MD. Jarquavious Fentress, Nicolette Bang, RN VAST

## 2014-12-13 NOTE — Progress Notes (Signed)
Patient ID: Caitlin Hampton, female   DOB: 1946/05/09, 69 y.o.   MRN: [id number]    Subjective:  Breathing is better   Objective:  Filed Vitals:   12/12/14 2137 12/12/14 2253 12/13/14 0430 12/13/14 0727  BP: 118/47  150/67   Pulse:   62   Temp:   98.1 F (36.7 C)   TempSrc:   Oral   Resp:   21   Height:      Weight:   264 lb 8.8 oz (120 kg)   SpO2:  90% 93% 98%    Intake/Output from previous day:  Intake/Output Summary (Last 24 hours) at 12/13/14 0859 Last data filed at 12/13/14 1700  Gross per 24 hour  Intake 2443.75 ml  Output    850 ml  Net 1593.75 ml    Physical Exam: Affect appropriate Obese cushingoid female  HEENT: normal Neck supple with no adenopathy JVP normal no bruits no thyromegaly Lungs poor breath sounds exp wheezing and good diaphragmatic motion Heart:  S1/S2 no murmur, no rub, gallop or click PMI normal Abdomen: benighn, BS positve, no tenderness, no AAA no bruit.  No HSM or HJR Distal pulses intact with no bruits No edema Neuro non-focal Skin warm and dry Bruising on arms  No muscular weakness   Lab Results: Basic Metabolic Panel:  Recent Labs  12/11/14 1625 12/11/14 1958 12/12/14 0812  NA 139  --  137  K 4.8  --  4.9  CL 105  --  103  CO2 24  --  24  GLUCOSE 112*  --  124*  BUN 39*  --  37*  CREATININE 1.48*  --  1.33*  CALCIUM 8.6  --  8.5  MG  --  2.6*  --    Liver Function Tests:  Recent Labs  12/11/14 1625 12/12/14 0812  AST 36 66*  ALT 67* 70*  ALKPHOS 88 87  BILITOT 1.1 0.9  PROT 6.7 6.8  ALBUMIN 3.7 3.6   CBC:  Recent Labs  12/11/14 1625 12/12/14 0812  WBC 18.1* 14.1*  NEUTROABS  --  13.3*  HGB 8.9* 8.8*  HCT 30.0* 29.2*  MCV 93.5 93.0  PLT 261 209   Cardiac Enzymes:  Recent Labs  12/11/14 1958 12/12/14 0115 12/12/14 0800  TROPONINI 0.05* 0.05* 0.05*   Thyroid Function Tests:  Recent Labs  12/11/14 1958  TSH 0.632   Anemia Panel:  Recent Labs  12/12/14 1104  VITAMINB12 484  FOLATE  11.5  FERRITIN 58  TIBC 319  IRON 14*  RETICCTPCT 3.3*    Imaging: Dg Chest 2 View  12/11/2014   CLINICAL DATA:  Right leg pain and weakness.  EXAM: CHEST  2 VIEW  COMPARISON:  03/31/2014 and prior radiographs  FINDINGS: Cardiomegaly and pulmonary vascular congestion identified.  Left hemidiaphragm elevation again noted.  There is no evidence of focal airspace disease, pulmonary edema, suspicious pulmonary nodule/mass, pleural effusion, or pneumothorax. No acute bony abnormalities are identified.  IMPRESSION: Cardiomegaly with pulmonary vascular congestion.   Electronically Signed   By: Margarette Canada M.D.   On: 12/11/2014 16:42    Cardiac Studies:  ECG:  SB low voltage  Nonspecific ST changes misread by computer    Telemetry: SR rate 55-60  No long pauses   Echo: Study Conclusions  - Left ventricle: The cavity size was normal. Wall thickness was increased in a pattern of mild LVH. Systolic function was vigorous. The estimated ejection fraction was in the range of 65% to  70%. Wall motion was normal; there were no regional wall motion abnormalities. Left ventricular diastolic function parameters were normal. - Aortic valve: Valve area (VTI): 1.85 cm^2. Valve area (Vmax): 1.99 cm^2. - Mitral valve: There was mild regurgitation. - Left atrium: The atrium was mildly dilated. - Pulmonary arteries: PA peak pressure: 60 mm Hg (S).  Medications:   . sodium chloride   Intravenous Once  . antiseptic oral rinse  7 mL Mouth Rinse BID  . atorvastatin  10 mg Oral Daily  . furosemide  40 mg Intravenous Daily  . ipratropium-albuterol  3 mL Nebulization TID  . levothyroxine  100 mcg Oral QAC breakfast  . rivaroxaban  20 mg Oral Q supper  . sodium chloride  3 mL Intravenous Q12H  . vancomycin  1,250 mg Intravenous Q24H     . sodium chloride 1,000 mL (12/11/14 1716)  . 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

## 2014-12-13 NOTE — Evaluation (Signed)
Physical Therapy Evaluation Patient Details Name: Caitlin Hampton MRN: [id number] DOB: 09-21-1946 Today's Date: 12/13/2014   History of Present Illness  69 y.o. WF PMHx anxiety, HTN, systolic CHF, atrial fibrillation on anticoagulant, COPD on home oxygen 3 L O2 via [place], hypothyroidism admitted with SOB, RLE weakness, increased difficulty walking.  Clinical Impression  Pt admitted with above diagnosis. Pt currently with functional limitations due to the deficits listed below (see PT Problem List). SaO2 at rest 88% on 3L O2 [place], dropped to 79% on 3L with rolling in bed, 93% on 5L O2  at rest. Pt not able to tolerate mobility due to hypoxia. +2 assist to scoot up in bed. She reported pain in R calf, red area noted, RN notified. Will follow and will attempt OOB as cardiopulmonary status allows. At present, SNF recommended.  Pt will benefit from skilled PT to increase their independence and safety with mobility to allow discharge to the venue listed below.      Follow Up Recommendations SNF    Equipment Recommendations  Wheelchair (measurements PT)    Recommendations for Other Services       Precautions / Restrictions Precautions Precautions: Fall Precaution Comments: monitor O2 Restrictions Weight Bearing Restrictions: No      Mobility  Bed Mobility Overal bed mobility: +2 for physical assistance;Needs Assistance Bed Mobility: Rolling Rolling: Mod assist         General bed mobility comments: mod A to initiate rolling to L, +2 to scoot up in bed with pt assisting by pushing with BLEs in hook lying; further mobiilty deferred due to O2 sats dropping to 79% on 3L O2 with rolling in bed  Transfers                    Ambulation/Gait                Stairs            Wheelchair Mobility    Modified Rankin (Stroke Patients Only)       Balance                                             Pertinent Vitals/Pain Pain Assessment:  Faces Faces Pain Scale: Hurts even more Pain Location: R posterior calf with movement, red area noted, RN informed Pain Descriptors / Indicators: Sore Pain Intervention(s): Limited activity within patient's tolerance;Monitored during session    Home Living Family/patient expects to be discharged to:: Private residence Living Arrangements: Other relatives (sister) Available Help at Discharge: Family Type of Home: House Home Access: Stairs to enter   Technical brewer of Steps: 1 Home Layout: One level Home Equipment: Environmental consultant - 2 wheels;Toilet riser      Prior Function Level of Independence: Independent with assistive device(s)         Comments: used RW when ambulating, for past week and a half was requiring help for mobility, at basline is independent with sponge bath, 3 falls in past 3 months     Hand Dominance        Extremity/Trunk Assessment   Upper Extremity Assessment: LUE deficits/detail       LUE Deficits / Details: edema noted L upper arm   Lower Extremity Assessment: RLE deficits/detail RLE Deficits / Details: pitting edema noted R ankle, movement of RLE limited by pain in R posterior calf, area of  reddness noted, RN informed, able to flex B knees into hook lying to assist with scooting up in bed       Communication   Communication: No difficulties  Cognition Arousal/Alertness: Awake/alert Behavior During Therapy: WFL for tasks assessed/performed Overall Cognitive Status: Within Functional Limits for tasks assessed       Memory: Decreased short-term memory (sister provided some details of PLOF)              General Comments      Exercises        Assessment/Plan    PT Assessment Patient needs continued PT services  PT Diagnosis Generalized weakness;Acute pain   PT Problem List Decreased strength;Decreased activity tolerance;Decreased mobility;Cardiopulmonary status limiting activity  PT Treatment Interventions Gait training;DME  instruction;Functional mobility training;Therapeutic activities;Patient/family education;Therapeutic exercise   PT Goals (Current goals can be found in the Care Plan section) Acute Rehab PT Goals Patient Stated Goal: to be more mobile PT Goal Formulation: With patient/family Time For Goal Achievement: 12/27/14 Potential to Achieve Goals: Fair    Frequency Min 3X/week   Barriers to discharge        Co-evaluation               End of Session Equipment Utilized During Treatment: Oxygen Activity Tolerance: Treatment limited secondary to medical complications (Comment) (hypoxia) Patient left: in bed;with family/visitor present Nurse Communication: Mobility status         Time: 1700-1749 PT Time Calculation (min) (ACUTE ONLY): 34 min   Charges:   PT Evaluation $Initial PT Evaluation Tier I: 1 Procedure PT Treatments $Therapeutic Activity: 8-22 mins   PT G Codes:        Philomena Doheny 12/13/2014, 9:53 AM [phone]

## 2014-12-14 DIAGNOSIS — R7989 Other specified abnormal findings of blood chemistry: Secondary | ICD-10-CM

## 2014-12-14 LAB — BASIC METABOLIC PANEL
Anion gap: 9 (ref 5–15)
BUN: 42 mg/dL — ABNORMAL HIGH (ref 6–23)
CO2: 29 mmol/L (ref 19–32)
CREATININE: 1.43 mg/dL — AB (ref 0.50–1.10)
Calcium: 8.6 mg/dL (ref 8.4–10.5)
Chloride: 104 mmol/L (ref 96–112)
GFR, EST AFRICAN AMERICAN: 42 mL/min — AB (ref >90)
GFR, EST NON AFRICAN AMERICAN: 36 mL/min — AB (ref >90)
Glucose, Bld: 102 mg/dL — ABNORMAL HIGH (ref 70–99)
POTASSIUM: 4.6 mmol/L (ref 3.5–5.1)
Sodium: 142 mmol/L (ref 135–145)

## 2014-12-14 LAB — CBC
HCT: 27.5 % — ABNORMAL LOW (ref 36.0–46.0)
Hemoglobin: 8.1 g/dL — ABNORMAL LOW (ref 12.0–15.0)
MCH: 27.7 pg (ref 26.0–34.0)
MCHC: 29.5 g/dL — ABNORMAL LOW (ref 30.0–36.0)
MCV: 94.2 fL (ref 78.0–100.0)
Platelets: 241 10*3/uL (ref 150–400)
RBC: 2.92 MIL/uL — AB (ref 3.87–5.11)
RDW: 16.4 % — ABNORMAL HIGH (ref 11.5–15.5)
WBC: 15.4 10*3/uL — ABNORMAL HIGH (ref 4.0–10.5)

## 2014-12-14 MED ORDER — CYCLOBENZAPRINE HCL 5 MG PO TABS
5.0000 mg | ORAL_TABLET | Freq: Once | ORAL | Status: AC
  Administered 2014-12-15: 5 mg via ORAL
  Filled 2014-12-14 (×2): qty 1

## 2014-12-14 MED ORDER — FUROSEMIDE 10 MG/ML IJ SOLN
40.0000 mg | Freq: Two times a day (BID) | INTRAMUSCULAR | Status: DC
  Administered 2014-12-14 – 2014-12-16 (×5): 40 mg via INTRAVENOUS
  Filled 2014-12-14 (×5): qty 4

## 2014-12-14 MED ORDER — LORAZEPAM 0.5 MG PO TABS
0.2500 mg | ORAL_TABLET | Freq: Once | ORAL | Status: AC
  Administered 2014-12-14: 0.25 mg via ORAL
  Filled 2014-12-14: qty 1

## 2014-12-14 MED ORDER — BUDESONIDE 0.25 MG/2ML IN SUSP
0.2500 mg | Freq: Two times a day (BID) | RESPIRATORY_TRACT | Status: DC
  Administered 2014-12-14 – 2014-12-16 (×4): 0.25 mg via RESPIRATORY_TRACT
  Filled 2014-12-14 (×4): qty 2

## 2014-12-14 MED ORDER — DOXYCYCLINE HYCLATE 100 MG PO TABS
100.0000 mg | ORAL_TABLET | Freq: Two times a day (BID) | ORAL | Status: DC
  Administered 2014-12-14 – 2014-12-16 (×5): 100 mg via ORAL
  Filled 2014-12-14 (×5): qty 1

## 2014-12-14 MED ORDER — RIVAROXABAN 15 MG PO TABS
15.0000 mg | ORAL_TABLET | Freq: Every day | ORAL | Status: DC
  Administered 2014-12-14: 15 mg via ORAL
  Filled 2014-12-14 (×2): qty 1

## 2014-12-14 NOTE — Progress Notes (Signed)
Patient ID: Caitlin Hampton, female   DOB: 10-28-1945, 69 y.o.   MRN: [id number]    Subjective:  Marked dyspnea trying to ambulate with walker and sats in low 80%  Objective:  Filed Vitals:   12/14/14 0339 12/14/14 0449 12/14/14 0515 12/14/14 0807  BP:  134/42    Pulse:  64    Temp:  97.3 F (36.3 C)    TempSrc:  Oral    Resp:  21    Height:      Weight: 255 lb 11.7 oz (116 kg)     SpO2:  98% 94% 89%    Intake/Output from previous day:  Intake/Output Summary (Last 24 hours) at 12/14/14 0836 Last data filed at 12/14/14 0700  Gross per 24 hour  Intake  827.5 ml  Output   1850 ml  Net -1022.5 ml    Physical Exam: Affect appropriate Obese cushingoid female  HEENT: normal Neck supple with no adenopathy JVP normal no bruits no thyromegaly Lungs poor breath sounds exp wheezing and good diaphragmatic motion Heart:  S1/S2 no murmur, no rub, gallop or click PMI normal Abdomen: benighn, BS positve, no tenderness, no AAA no bruit.  No HSM or HJR Distal pulses intact with no bruits No edema Neuro non-focal Skin warm and dry Bruising on arms with dressing LLE   No muscular weakness   Lab Results: Basic Metabolic Panel:  Recent Labs  12/11/14 1958  12/13/14 1726 12/14/14 0513  NA  --   < > 138 142  K  --   < > 4.5 4.6  CL  --   < > 102 104  CO2  --   < > 25 29  GLUCOSE  --   < > 85 102*  BUN  --   < > 43* 42*  CREATININE  --   < > 1.51* 1.43*  CALCIUM  --   < > 8.5 8.6  MG 2.6*  --  2.7*  --   < > = values in this interval not displayed. Liver Function Tests:  Recent Labs  12/11/14 1625 12/12/14 0812  AST 36 66*  ALT 67* 70*  ALKPHOS 88 87  BILITOT 1.1 0.9  PROT 6.7 6.8  ALBUMIN 3.7 3.6   CBC:  Recent Labs  12/12/14 0812 12/14/14 0513  WBC 14.1* 15.4*  NEUTROABS 13.3*  --   HGB 8.8* 8.1*  HCT 29.2* 27.5*  MCV 93.0 94.2  PLT 209 241   Cardiac Enzymes:  Recent Labs  12/11/14 1958 12/12/14 0115 12/12/14 0800  TROPONINI 0.05* 0.05* 0.05*    Thyroid Function Tests:  Recent Labs  12/11/14 1958  TSH 0.632   Anemia Panel:  Recent Labs  12/12/14 1104  VITAMINB12 484  FOLATE 11.5  FERRITIN 58  TIBC 319  IRON 14*  RETICCTPCT 3.3*    Imaging: Dg Chest Port 1 View  12/13/2014   CLINICAL DATA:  Acute respiratory failure, hypertension, COPD, CHF, GERD  EXAM: PORTABLE CHEST - 1 VIEW  COMPARISON:  Portable exam 1650 hours compared to 12/11/2014  FINDINGS: Enlargement of cardiac silhouette with pulmonary vascular congestion.  Atherosclerotic calcification aorta.  Hazy interstitial infiltrates in both lungs question minimal failure.  Lordotic positioning.  No gross pleural effusion or pneumothorax.  IMPRESSION: Suspect mild CHF.   Electronically Signed   By: Lavonia Dana M.D.   On: 12/13/2014 17:22    Cardiac Studies:  ECG:  SB low voltage  Nonspecific ST changes misread by computer  Telemetry: SR rate 55-60  No long pauses 12/14/2014   Echo: Study Conclusions  - Left ventricle: The cavity size was normal. Wall thickness was increased in a pattern of mild LVH. Systolic function was vigorous. The estimated ejection fraction was in the range of 65% to 70%. Wall motion was normal; there were no regional wall motion abnormalities. Left ventricular diastolic function parameters were normal. - Aortic valve: Valve area (VTI): 1.85 cm^2. Valve area (Vmax): 1.99 cm^2. - Mitral valve: There was mild regurgitation. - Left atrium: The atrium was mildly dilated. - Pulmonary arteries: PA peak pressure: 60 mm Hg (S).  Medications:   . antiseptic oral rinse  7 mL Mouth Rinse BID  . atorvastatin  10 mg Oral Daily  . doxycycline  100 mg Oral Q12H  . furosemide  40 mg Intravenous Q12H  . ipratropium-albuterol  3 mL Nebulization TID  . levothyroxine  100 mcg Oral QAC breakfast  . rivaroxaban  20 mg Oral Q supper  . sodium chloride  3 mL Intravenous Q12H     . 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  Will sign off   Jenkins Rouge 12/14/2014, 8:36 AM

## 2014-12-14 NOTE — Progress Notes (Signed)
PICC order discontinued per verbal order from MD.  MD will put in new order if unable to get a working IV or if labs cannot be drawn.

## 2014-12-14 NOTE — Progress Notes (Signed)
CSW provided SNF bed offers to patient who accepted bed at [facility]. CSW confirmed with Suanne Marker at [facility] that they would be able to take patient when stable for discharge.   Clinical Social Work Department CLINICAL SOCIAL WORK PLACEMENT NOTE 12/14/2014  Patient:  Caitlin Hampton, Caitlin Hampton  Account Number:  [id number] Admit date:  12/11/2014  Clinical Social Worker:  Renold Genta  Date/time:  12/13/2014 03:23 PM  Clinical Social Work is seeking post-discharge placement for this patient at the following level of care:   SKILLED NURSING   (*CSW will update this form in Epic as items are completed)   12/13/2014  Patient/family provided with [place] Department of Clinical Social Work's list of facilities offering this level of care within the geographic area requested by the patient (or if unable, by the patient's family).  12/13/2014  Patient/family informed of their freedom to choose among providers that offer the needed level of care, that participate in Medicare, Medicaid or managed care program needed by the patient, have an available bed and are willing to accept the patient.  12/13/2014  Patient/family informed of [facility]' ownership interest in [facility], as well as of the fact that they are under no obligation to receive care at this facility.  PASARR submitted to EDS on 12/13/2014 PASARR number received on 12/13/2014  FL2 transmitted to all facilities in geographic area requested by pt/family on  12/13/2014 FL2 transmitted to all facilities within larger geographic area on   Patient informed that his/her managed care company has contracts with or will negotiate with  certain facilities, including the following:   [facility]     Patient/family informed of bed offers received:  12/14/2014 Patient chooses bed at [facility] Physician recommends and patient chooses bed at    Patient to be transferred to [facility] on   Patient to be transferred to facility by  Patient and family notified of transfer on  Name of family member notified:    The following physician request were entered in Epic:   Additional Comments:    Raynaldo Opitz, [place] Social Worker cell #: [phone]

## 2014-12-14 NOTE — Progress Notes (Signed)
16 french foley placed on 3/23 for aggressive diuresis.  Pt 02 still dropping to mid 80's at times on 5L.  MD aware, no new orders.  Will continue to monitor closely.

## 2014-12-14 NOTE — Progress Notes (Signed)
PROGRESS NOTE  Caitlin Hampton:[id number] DOB: [birth year] DOA: 12/11/2014 PCP: [name], MD  Assessment/Plan: Acute on chronic diastolic CHF -The patient appears clinically volume overloaded -continue IV lasix; daily weight and strict I's and O's -Repeated echocardiogram--EF 65-70%, no WMA, PAP = 60 -will monitor BMET and adjust/reeplete electrolytes  Bradycardia -improving -amiodarone and diltiazem held at time of admission -Appreciate cardiology to help adjustment of the patient's antiarrhythmic medications  Acute and chronic respiratory failure/COPD -secondary to CHF in setting of underlying COPD; normally on 3L at home -will start flutter valve and pulmicort -continue Bronchodilators when necessary -on doxycycline for potential component of bronchiectasis -Influenza PCR--neg-->d/c tamiflu  Atrial fibrillation -Rate controlled---actually bradycardic -Continue rivaroxaban; dose per pharmacy  Elevated troponin -Demand ischemia in the setting of worsening renal function and decompensated heart failure -No chest pain presently -EKG without any ST-T wave change -continue tx as mentioned above for CHF exacerbation  Anemia  -Fecal occult blood test  -Hemoglobin has dropped 4 g since 08/09/2014  -Check anemia panel-->iron sat 4% with ferritin 58 -give IV dose iron and start po iron  Lower extremity pain and edema  -Venous duplex neg for DVT/SVT -continue IV diuretics  Left lower extremity wound--mild cellulitis -Wound care nurse consultation appreciated; will follow rec's -will deescalate abx therapy to doxycycline   Acute on chronic renal failure (CKD2) -Baseline creatinine 0.8-1.1 -due to CHF exacerbation and diuresis -Cr 1.42; will monitor closely  -minimize nephrotoxic agents  Deconditioning -PT eval-->recommending SNF -will arrange after discharge; family and patient in agreement  Family Communication:   Sister updated at beside  3/22 Disposition Plan:   SNF when stable    Procedures/Studies: Dg Chest 2 View  12/11/2014   CLINICAL DATA:  Right leg pain and weakness.  EXAM: CHEST  2 VIEW  COMPARISON:  03/31/2014 and prior radiographs  FINDINGS: Cardiomegaly and pulmonary vascular congestion identified.  Left hemidiaphragm elevation again noted.  There is no evidence of focal airspace disease, pulmonary edema, suspicious pulmonary nodule/mass, pleural effusion, or pneumothorax. No acute bony abnormalities are identified.  IMPRESSION: Cardiomegaly with pulmonary vascular congestion.   Electronically Signed   By: Margarette Canada M.D.   On: 12/11/2014 16:42   Dg Chest Port 1 View  12/13/2014   CLINICAL DATA:  Acute respiratory failure, hypertension, COPD, CHF, GERD  EXAM: PORTABLE CHEST - 1 VIEW  COMPARISON:  Portable exam 1650 hours compared to 12/11/2014  FINDINGS: Enlargement of cardiac silhouette with pulmonary vascular congestion.  Atherosclerotic calcification aorta.  Hazy interstitial infiltrates in both lungs question minimal failure.  Lordotic positioning.  No gross pleural effusion or pneumothorax.  IMPRESSION: Suspect mild CHF.   Electronically Signed   By: Lavonia Dana M.D.   On: 12/13/2014 17:22    Subjective: Patient states that breathing is improving; but still desaturating with minimal activity and having trouble speaking in full sentences. Patient denies CP. Good urine output (neg 2300)  Objective: Filed Vitals:   12/14/14 1153 12/14/14 1154 12/14/14 1238 12/14/14 1441  BP:    123/62  Pulse:    63  Temp:    97.9 F (36.6 C)  TempSrc:    Oral  Resp:    24  Height:      Weight:   117.1 kg (258 lb 2.5 oz)   SpO2: 90% 92%  95%    Intake/Output Summary (Last 24 hours) at 12/14/14 1523 Last data filed at 12/14/14 1446  Gross per 24 hour  Intake    640 ml  Output   2950 ml  Net  -2310 ml   Weight change: -4 kg (-8 lb 13.1 oz) Exam:   General:  Pt is alert, follows commands appropriately, having  difficulty speaking in full sentences and with O2 desat with minimally exertion  HEENT: No icterus, No thrush, Cross/AT  Cardiovascular: RRR, S1/S2, no rubs, no gallops  Respiratory: positive up to mid-lungs Bilateral crackles. No wheezing; decrease air movement   Abdomen: Soft/+BS, non tender, non distended, no guarding  Extremities: 2+LE edema, No lymphangitis, No petechiae, No rashes, no synovitis  Data Reviewed: Basic Metabolic Panel:  Recent Labs Lab 12/11/14 1625 12/11/14 1958 12/12/14 0812 12/13/14 1726 12/14/14 0513  NA 139  --  137 138 142  K 4.8  --  4.9 4.5 4.6  CL 105  --  103 102 104  CO2 24  --  24 25 29   GLUCOSE 112*  --  124* 85 102*  BUN 39*  --  37* 43* 42*  CREATININE 1.48*  --  1.33* 1.51* 1.43*  CALCIUM 8.6  --  8.5 8.5 8.6  MG  --  2.6*  --  2.7*  --    Liver Function Tests:  Recent Labs Lab 12/11/14 1625 12/12/14 0812  AST 36 66*  ALT 67* 70*  ALKPHOS 88 87  BILITOT 1.1 0.9  PROT 6.7 6.8  ALBUMIN 3.7 3.6   CBC:  Recent Labs Lab 12/11/14 1625 12/12/14 0812 12/14/14 0513  WBC 18.1* 14.1* 15.4*  NEUTROABS  --  13.3*  --   HGB 8.9* 8.8* 8.1*  HCT 30.0* 29.2* 27.5*  MCV 93.5 93.0 94.2  PLT 261 209 241   Cardiac Enzymes:  Recent Labs Lab 12/11/14 1958 12/12/14 0115 12/12/14 0800  TROPONINI 0.05* 0.05* 0.05*   BNP: Invalid input(s): POCBNP CBG: No results for input(s): GLUCAP in the last 168 hours.  Recent Results (from the past 240 hour(s))  Culture, blood (routine x 2)     Status: None (Preliminary result)   Collection Time: 12/11/14  7:56 PM  Result Value Ref Range Status   Specimen Description BLOOD LEFT ANTECUBITAL  Final   Special Requests BOTTLES DRAWN AEROBIC AND ANAEROBIC 5CC  Final   Culture   Final           BLOOD CULTURE RECEIVED NO GROWTH TO DATE CULTURE WILL BE HELD FOR 5 DAYS BEFORE ISSUING A FINAL NEGATIVE REPORT Performed at [facility]    Report Status PENDING  Incomplete  Culture, blood  (routine x 2)     Status: None (Preliminary result)   Collection Time: 12/11/14  7:58 PM  Result Value Ref Range Status   Specimen Description BLOOD RIGHT HAND  Final   Special Requests BOTTLES DRAWN AEROBIC AND ANAEROBIC 5ML  Final   Culture   Final           BLOOD CULTURE RECEIVED NO GROWTH TO DATE CULTURE WILL BE HELD FOR 5 DAYS BEFORE ISSUING A FINAL NEGATIVE REPORT Performed at [facility]    Report Status PENDING  Incomplete     Scheduled Meds: . antiseptic oral rinse  7 mL Mouth Rinse BID  . atorvastatin  10 mg Oral Daily  . budesonide (PULMICORT) nebulizer solution  0.25 mg Nebulization BID  . doxycycline  100 mg Oral Q12H  . furosemide  40 mg Intravenous Q12H  . ipratropium-albuterol  3 mL Nebulization TID  . levothyroxine  100 mcg Oral QAC breakfast  .  rivaroxaban  15 mg Oral Q supper  . sodium chloride  3 mL Intravenous Q12H   Continuous Infusions: . sodium chloride 1,000 mL (12/11/14 1716)     Barton Dubois md  [facility] Pager [phone]  If 7PM-7AM, please contact night-coverage [email or web address] Password [email or web address] 12/14/2014, 3:23 PM   LOS: 3 days

## 2014-12-14 NOTE — Progress Notes (Signed)
Pt's o2 sats dropping to low 80's with mild exertion.  Lasix 40 mg IV given, respiratory therapy called and breathing treatment given.  MD notified and gave verbal order to titrate o2 up to 5 L, and page MD if pt needs more than 5L to keep sats over 89.  Will continue to monitor pt closely.

## 2014-12-14 NOTE — Progress Notes (Signed)
During the night, pt has called out very frequently to use the bedpan. Each time pt uses the bedpan, she urinates less than 300cc. Pt is currently on a NRB mask and gets very winded turning from side to side to use the bedpan. On Call NP. Tylene Fantasia, notified and gave an order to place a foley for aggressive IV diuresis. RN and NTs entered room to insert foley and pt refused foley at this time. Will continue to monitor pt and carry out POC. Carnella Gu[place] I

## 2014-12-14 NOTE — Progress Notes (Signed)
ANTICOAGULATION CONSULT NOTE - Initial Consult  Pharmacy Consult for Xarelto Indication: atrial fibrillation  No Known Allergies  Patient Measurements: Height: 5\' 4"  (162.6 cm) Weight: 258 lb 2.5 oz (117.1 kg) IBW/kg (Calculated) : 54.7  Vital Signs: Temp: 97.3 F (36.3 C) (03/23 0449) Temp Source: Oral (03/23 0449) BP: 134/42 mmHg (03/23 0449) Pulse Rate: 64 (03/23 0449)  Labs:  Recent Labs  12/11/14 1625 12/11/14 1958 12/12/14 0115 12/12/14 0800 12/12/14 0812 12/13/14 1726 12/14/14 0513  HGB 8.9*  --   --   --  8.8*  --  8.1*  HCT 30.0*  --   --   --  29.2*  --  27.5*  PLT 261  --   --   --  209  --  241  LABPROT 39.8*  --   --   --  30.3*  --   --   INR 4.07*  --   --   --  2.87*  --   --   CREATININE 1.48*  --   --   --  1.33* 1.51* 1.43*  TROPONINI  --  0.05* 0.05* 0.05*  --   --   --     Estimated Creatinine Clearance: 46.7 mL/min (by C-G formula based on Cr of 1.43).   Medical History: Past Medical History  Diagnosis Date  . Hypertension   . Thyroid disease   . COPD (chronic obstructive pulmonary disease)   . Anxiety   . H/O hiatal hernia   . Hypothyroidism   . Shortness of breath   . Atrial flutter 09/07/2012    , RVR/ hospital 12/13- rate control and Echo normal- on xarelto, when spontaneously converted she had bradycardia on Metoprolol and Digoxin  . GERD (gastroesophageal reflux disease)   . Rhinitis   . Parotitis     , on CT of face- 12/13  . CHF (congestive heart failure)   . Hx of echocardiogram     Echo (8/15):  EF 60-65%  . NSTEMI (non-ST elevated myocardial infarction)     Medications:  Scheduled:  . antiseptic oral rinse  7 mL Mouth Rinse BID  . atorvastatin  10 mg Oral Daily  . doxycycline  100 mg Oral Q12H  . furosemide  40 mg Intravenous Q12H  . ipratropium-albuterol  3 mL Nebulization TID  . levothyroxine  100 mcg Oral QAC breakfast  . rivaroxaban  15 mg Oral Q supper  . sodium chloride  3 mL Intravenous Q12H    Infusions:  . sodium chloride 1,000 mL (12/11/14 1716)    Assessment: 20 yoF admitted 3/20 after mechanical fall at home, and MD notes she has had 3 falls within the past 3 months.  PMH includes atrial fibrillation on chronic Xarelto anticoagulation.  Pharmacy is consulted for renal dosing. PTA dosing, resumed inpatient: Xarelto 20mg  PO daily with supper.  Today, 12/14/2014:   CBC: Hgb 8.1 (low, and decreasing), Plt 241 remain WNL  MD noted iron deficiency on 3/22, would consider iron repletion  Weight: 117 kg  Renal function: SCr 1.43 with CrCl ~ 46.7 ml/min  For CrCl 15 - 50 ml/min, recommended dose is 15mg  daily.   Goal of Therapy:  Xarelto per renal function Monitor platelets by anticoagulation protocol: Yes   Plan:   Reduce to Xarelto 15 mg daily with supper.  Follow up renal function and clinical condition.  Gretta Arab PharmD, BCPS Pager [phone] 12/14/2014 1:54 PM

## 2014-12-15 DIAGNOSIS — D508 Other iron deficiency anemias: Secondary | ICD-10-CM

## 2014-12-15 LAB — BASIC METABOLIC PANEL
ANION GAP: 10 (ref 5–15)
BUN: 37 mg/dL — ABNORMAL HIGH (ref 6–23)
CALCIUM: 9 mg/dL (ref 8.4–10.5)
CHLORIDE: 102 mmol/L (ref 96–112)
CO2: 31 mmol/L (ref 19–32)
Creatinine, Ser: 1.18 mg/dL — ABNORMAL HIGH (ref 0.50–1.10)
GFR calc Af Amer: 53 mL/min — ABNORMAL LOW (ref >90)
GFR calc non Af Amer: 46 mL/min — ABNORMAL LOW (ref >90)
Glucose, Bld: 96 mg/dL (ref 70–99)
Potassium: 3.9 mmol/L (ref 3.5–5.1)
SODIUM: 143 mmol/L (ref 135–145)

## 2014-12-15 LAB — URINALYSIS, ROUTINE W REFLEX MICROSCOPIC
Bilirubin Urine: NEGATIVE
Glucose, UA: NEGATIVE mg/dL
Ketones, ur: NEGATIVE mg/dL
LEUKOCYTES UA: NEGATIVE
Nitrite: NEGATIVE
PH: 5 (ref 5.0–8.0)
Protein, ur: NEGATIVE mg/dL
SPECIFIC GRAVITY, URINE: 1.008 (ref 1.005–1.030)
Urobilinogen, UA: 0.2 mg/dL (ref 0.0–1.0)

## 2014-12-15 LAB — URINE MICROSCOPIC-ADD ON

## 2014-12-15 MED ORDER — RIVAROXABAN 20 MG PO TABS
20.0000 mg | ORAL_TABLET | Freq: Every day | ORAL | Status: DC
  Administered 2014-12-15: 20 mg via ORAL
  Filled 2014-12-15 (×2): qty 1

## 2014-12-15 MED ORDER — CYCLOBENZAPRINE HCL 5 MG PO TABS
5.0000 mg | ORAL_TABLET | Freq: Once | ORAL | Status: AC
  Administered 2014-12-15: 5 mg via ORAL

## 2014-12-15 MED ORDER — POLYSACCHARIDE IRON COMPLEX 150 MG PO CAPS
150.0000 mg | ORAL_CAPSULE | Freq: Two times a day (BID) | ORAL | Status: DC
  Administered 2014-12-15 – 2014-12-16 (×3): 150 mg via ORAL
  Filled 2014-12-15 (×3): qty 1

## 2014-12-15 NOTE — Progress Notes (Signed)
PROGRESS NOTE  Caitlin Hampton OMV:[id number] DOB: Jan 31, 1946 DOA: 12/11/2014 PCP: [name], MD  Assessment/Plan: Acute on chronic diastolic CHF -The patient appears clinically volume overloaded -continue IV lasix; daily weight and strict I's and O's -Repeated echocardiogram--EF 65-70%, no WMA, PAP = 60 -will monitor BMET and adjust/reeplete electrolytes  Bradycardia -improving -amiodarone and diltiazem held at time of admission -Appreciate cardiology to help adjustment of the patient's antiarrhythmic medications  Acute and chronic respiratory failure/COPD -secondary to CHF in setting of underlying COPD; normally on 3L at home -will continue flutter valve and pulmicort -continue Bronchodilators when necessary -on doxycycline for potential component of bronchiectasis -Influenza PCR--neg-->d/c tamiflu  Atrial fibrillation -Rate controlled---actually bradycardic -Continue rivaroxaban; dose per pharmacy  Elevated troponin -Demand ischemia in the setting of worsening renal function and decompensated heart failure -No chest pain presently -EKG without any ST-T wave change -continue tx as mentioned above for CHF exacerbation  Anemia  -Fecal occult blood test  -Hemoglobin has dropped 4 g since 08/09/2014  -Check anemia panel-->iron sat 4% with ferritin 58 -started on niferex  Lower extremity pain and edema  -Venous duplex neg for DVT/SVT -continue IV diuretics  Left lower extremity wound--mild cellulitis -Wound care nurse consultation appreciated; will follow rec's -will continue doxycycline   Acute on chronic renal failure (CKD2) -Baseline creatinine 0.8-1.1 -due to CHF exacerbation and diuresis -Cr 1.18; will monitor closely  -minimize nephrotoxic agents  Deconditioning -PT eval-->recommending SNF -will arrange after discharge; family and patient in agreement  Family Communication:   Sister updated at beside 3/22 Disposition Plan:   SNF when  stable    Procedures/Studies: Dg Chest 2 View  12/11/2014   CLINICAL DATA:  Right leg pain and weakness.  EXAM: CHEST  2 VIEW  COMPARISON:  03/31/2014 and prior radiographs  FINDINGS: Cardiomegaly and pulmonary vascular congestion identified.  Left hemidiaphragm elevation again noted.  There is no evidence of focal airspace disease, pulmonary edema, suspicious pulmonary nodule/mass, pleural effusion, or pneumothorax. No acute bony abnormalities are identified.  IMPRESSION: Cardiomegaly with pulmonary vascular congestion.   Electronically Signed   By: Margarette Canada M.D.   On: 12/11/2014 16:42   Dg Chest Port 1 View  12/13/2014   CLINICAL DATA:  Acute respiratory failure, hypertension, COPD, CHF, GERD  EXAM: PORTABLE CHEST - 1 VIEW  COMPARISON:  Portable exam 1650 hours compared to 12/11/2014  FINDINGS: Enlargement of cardiac silhouette with pulmonary vascular congestion.  Atherosclerotic calcification aorta.  Hazy interstitial infiltrates in both lungs question minimal failure.  Lordotic positioning.  No gross pleural effusion or pneumothorax.  IMPRESSION: Suspect mild CHF.   Electronically Signed   By: Lavonia Dana M.D.   On: 12/13/2014 17:22    Subjective: Patient states that breathing continue to improve. Denies CP, nausea, vomiting and fever. Main complain is bladder spasm. Good urine output (neg 2500)  Objective: Filed Vitals:   12/15/14 0500 12/15/14 0918 12/15/14 1139 12/15/14 1317  BP: 140/46   107/45  Pulse:    64  Temp: 97.9 F (36.6 C)     TempSrc: Oral   Oral  Resp: 22   18  Height:      Weight: 115.7 kg (255 lb 1.2 oz)     SpO2: 100% 92% 83% 94%    Intake/Output Summary (Last 24 hours) at 12/15/14 1518 Last data filed at 12/15/14 1321  Gross per 24 hour  Intake    420 ml  Output   3000  ml  Net  -2580 ml   Weight change: 1.1 kg (2 lb 6.8 oz) Exam:   General:  Pt is alert, follows commands appropriately,breathing better and denying CP or palpitation  HEENT: No icterus,  No thrush, [place]/AT  Cardiovascular: RRR, S1/S2, no rubs, no gallops  Respiratory: improve air movement, still with bibasilar crackles. No wheezing; scattered rhonchi  Abdomen: Soft/+BS, non tender, non distended, no guarding  Extremities: 2+LE edema, No lymphangitis, No petechiae, no synovitis; LLE with cellulitic changes around wound.  Data Reviewed: Basic Metabolic Panel:  Recent Labs Lab 12/11/14 1625 12/11/14 1958 12/12/14 0812 12/13/14 1726 12/14/14 0513 12/15/14 0542  NA 139  --  137 138 142 143  K 4.8  --  4.9 4.5 4.6 3.9  CL 105  --  103 102 104 102  CO2 24  --  24 25 29 31   GLUCOSE 112*  --  124* 85 102* 96  BUN 39*  --  37* 43* 42* 37*  CREATININE 1.48*  --  1.33* 1.51* 1.43* 1.18*  CALCIUM 8.6  --  8.5 8.5 8.6 9.0  MG  --  2.6*  --  2.7*  --   --    Liver Function Tests:  Recent Labs Lab 12/11/14 1625 12/12/14 0812  AST 36 66*  ALT 67* 70*  ALKPHOS 88 87  BILITOT 1.1 0.9  PROT 6.7 6.8  ALBUMIN 3.7 3.6   CBC:  Recent Labs Lab 12/11/14 1625 12/12/14 0812 12/14/14 0513  WBC 18.1* 14.1* 15.4*  NEUTROABS  --  13.3*  --   HGB 8.9* 8.8* 8.1*  HCT 30.0* 29.2* 27.5*  MCV 93.5 93.0 94.2  PLT 261 209 241   Cardiac Enzymes:  Recent Labs Lab 12/11/14 1958 12/12/14 0115 12/12/14 0800  TROPONINI 0.05* 0.05* 0.05*   BNP: Invalid input(s): POCBNP CBG: No results for input(s): GLUCAP in the last 168 hours.  Recent Results (from the past 240 hour(s))  Culture, blood (routine x 2)     Status: None (Preliminary result)   Collection Time: 12/11/14  7:56 PM  Result Value Ref Range Status   Specimen Description BLOOD LEFT ANTECUBITAL  Final   Special Requests BOTTLES DRAWN AEROBIC AND ANAEROBIC 5CC  Final   Culture   Final           BLOOD CULTURE RECEIVED NO GROWTH TO DATE CULTURE WILL BE HELD FOR 5 DAYS BEFORE ISSUING A FINAL NEGATIVE REPORT Performed at [facility]    Report Status PENDING  Incomplete  Culture, blood (routine x 2)      Status: None (Preliminary result)   Collection Time: 12/11/14  7:58 PM  Result Value Ref Range Status   Specimen Description BLOOD RIGHT HAND  Final   Special Requests BOTTLES DRAWN AEROBIC AND ANAEROBIC 5ML  Final   Culture   Final           BLOOD CULTURE RECEIVED NO GROWTH TO DATE CULTURE WILL BE HELD FOR 5 DAYS BEFORE ISSUING A FINAL NEGATIVE REPORT Performed at [facility]    Report Status PENDING  Incomplete     Scheduled Meds: . antiseptic oral rinse  7 mL Mouth Rinse BID  . atorvastatin  10 mg Oral Daily  . budesonide (PULMICORT) nebulizer solution  0.25 mg Nebulization BID  . cyclobenzaprine  5 mg Oral Once  . doxycycline  100 mg Oral Q12H  . furosemide  40 mg Intravenous Q12H  . ipratropium-albuterol  3 mL Nebulization TID  . iron  polysaccharides  150 mg Oral BID  . levothyroxine  100 mcg Oral QAC breakfast  . rivaroxaban  20 mg Oral Q supper  . sodium chloride  3 mL Intravenous Q12H   Continuous Infusions: . sodium chloride 1,000 mL (12/11/14 1716)     Barton Dubois md  [facility] Pager [phone]  If 7PM-7AM, please contact night-coverage [email or web address] Password [facility] 12/15/2014, 3:18 PM   LOS: 4 days

## 2014-12-15 NOTE — Progress Notes (Signed)
Physical Therapy Treatment Patient Details Name: Caitlin Hampton MRN: [id number] DOB: 02-10-1946 Today's Date: 12/15/2014    History of Present Illness 69 y.o. WF PMHx anxiety, HTN, systolic CHF, atrial fibrillation on anticoagulant, COPD on home oxygen 3 L O2 via [place], hypothyroidism admitted with SOB, RLE weakness, increased difficulty walking.    PT Comments    Pt was able to transfer bed to recliner with RW and min/mod assist. SaO2 dropped to 83% on 5L O2 with activity. Frequent cues for pursed lip breathing as pt tends to mouth breathe. Improved activity tolerance compared to previous PT session.  Follow Up Recommendations  SNF     Equipment Recommendations  Wheelchair (measurements PT)    Recommendations for Other Services       Precautions / Restrictions Precautions Precautions: Fall Precaution Comments: monitor O2 Restrictions Weight Bearing Restrictions: No    Mobility  Bed Mobility Overal bed mobility: +2 for physical assistance;Needs Assistance Bed Mobility: Supine to Sit     Supine to sit: Mod assist     General bed mobility comments: HOB up 60*, assist with pad to pivot hips to EOB  Transfers Overall transfer level: Needs assistance Equipment used: Rolling walker (2 wheeled) Transfers: Sit to/from [employer] Transfers Sit to Stand: From elevated surface;Min assist;+2 safety/equipment Stand pivot transfers: +2 safety/equipment;Min assist       General transfer comment: assist to rise, +2 for safety; increased time to pivot with RW to recliner; SaO2 83% on 5L O2 [place] with activity, up to 89% after 3 minutes rest  Ambulation/Gait                 Stairs            Wheelchair Mobility    Modified Rankin (Stroke Patients Only)       Balance Overall balance assessment: Needs assistance   Sitting balance-Leahy Scale: Fair       Standing balance-Leahy Scale: Poor                      Cognition Arousal/Alertness:  Awake/alert Behavior During Therapy: WFL for tasks assessed/performed Overall Cognitive Status: Within Functional Limits for tasks assessed       Memory: Decreased short-term memory (sister provided some details of PLOF)              Exercises General Exercises - Lower Extremity Ankle Circles/Pumps: AROM;Both;Supine;20 reps    General Comments        Pertinent Vitals/Pain Pain Assessment: No/denies pain    Home Living                      Prior Function            PT Goals (current goals can now be found in the care plan section) Acute Rehab PT Goals Patient Stated Goal: to be more mobile PT Goal Formulation: With patient/family Time For Goal Achievement: 12/27/14 Potential to Achieve Goals: Fair Progress towards PT goals: Progressing toward goals    Frequency  Min 3X/week    PT Plan Current plan remains appropriate    Co-evaluation             End of Session Equipment Utilized During Treatment: Oxygen;Gait belt Activity Tolerance: Treatment limited secondary to medical complications (Comment);Patient limited by fatigue (hypoxia) Patient left: with family/visitor present;in chair     Time: 4098-1191 PT Time Calculation (min) (ACUTE ONLY): 28 min  Charges:  $Therapeutic Activity: 23-37 mins  G Codes:      Blondell Reveal Kistler 12/15/2014, 11:45 AM [phone]

## 2014-12-15 NOTE — Progress Notes (Signed)
[place] for Xarelto Indication: atrial fibrillation  No Known Allergies  Patient Measurements: Height: 5\' 4"  (162.6 cm) Weight: 255 lb 1.2 oz (115.7 kg) IBW/kg (Calculated) : 54.7  Vital Signs: Temp: 97.9 F (36.6 C) (03/24 0500) Temp Source: Oral (03/24 0500) BP: 140/46 mmHg (03/24 0500) Pulse Rate: 64 (03/24 0029)  Labs:  Recent Labs  12/13/14 1726 12/14/14 0513 12/15/14 0542  HGB  --  8.1*  --   HCT  --  27.5*  --   PLT  --  241  --   CREATININE 1.51* 1.43* 1.18*    Estimated Creatinine Clearance: 56.2 mL/min (by C-G formula based on Cr of 1.18).   Medications:  Scheduled:  . antiseptic oral rinse  7 mL Mouth Rinse BID  . atorvastatin  10 mg Oral Daily  . budesonide (PULMICORT) nebulizer solution  0.25 mg Nebulization BID  . doxycycline  100 mg Oral Q12H  . furosemide  40 mg Intravenous Q12H  . ipratropium-albuterol  3 mL Nebulization TID  . iron polysaccharides  150 mg Oral BID  . levothyroxine  100 mcg Oral QAC breakfast  . rivaroxaban  15 mg Oral Q supper  . sodium chloride  3 mL Intravenous Q12H   Assessment: 68 yoF admitted 3/20 after mechanical fall at home, and MD notes she has had 3 falls within the past 3 months.  PMH includes atrial fibrillation on chronic Xarelto anticoagulation.  Pharmacy is consulted for renal dosing. PTA dosing, resumed inpatient: Xarelto 20mg  PO daily with supper.  Today, 12/15/2014:   CBC: Hgb 8.1 (low, and decreasing), Plt 241 remain WNL  MD noted iron deficiency on 3/22, and Niferex started 3/24  Weight: 115 kg  Renal function: SCr improved to 1.18 with CrCl ~ 56 ml/min (using adj weight) but with CrCl ~ 81 ml/min using Total Body Weight  Noted drug-drug interaction with Diltiazem PTA, but has been held this admission.   Goal of Therapy:  Xarelto per renal function Monitor platelets by anticoagulation protocol: Yes   Plan:   Resume home dose, Xarelto 20 mg daily with  supper.  Follow up renal function and clinical condition.  Gretta Arab PharmD, BCPS Pager [phone] 12/15/2014 11:41 AM

## 2014-12-16 DIAGNOSIS — Z7901 Long term (current) use of anticoagulants: Secondary | ICD-10-CM

## 2014-12-16 DIAGNOSIS — E039 Hypothyroidism, unspecified: Secondary | ICD-10-CM

## 2014-12-16 LAB — BASIC METABOLIC PANEL
ANION GAP: 8 (ref 5–15)
BUN: 36 mg/dL — ABNORMAL HIGH (ref 6–23)
CHLORIDE: 100 mmol/L (ref 96–112)
CO2: 33 mmol/L — AB (ref 19–32)
Calcium: 8.2 mg/dL — ABNORMAL LOW (ref 8.4–10.5)
Creatinine, Ser: 0.96 mg/dL (ref 0.50–1.10)
GFR, EST AFRICAN AMERICAN: 68 mL/min — AB (ref >90)
GFR, EST NON AFRICAN AMERICAN: 59 mL/min — AB (ref >90)
GLUCOSE: 105 mg/dL — AB (ref 70–99)
POTASSIUM: 3.5 mmol/L (ref 3.5–5.1)
SODIUM: 141 mmol/L (ref 135–145)

## 2014-12-16 MED ORDER — DOXYCYCLINE HYCLATE 100 MG PO TABS: 100.0000 mg | ORAL_TABLET | Freq: Two times a day (BID) | ORAL | Status: DC

## 2014-12-16 MED ORDER — POLYSACCHARIDE IRON COMPLEX 150 MG PO CAPS: 150.0000 mg | ORAL_CAPSULE | Freq: Two times a day (BID) | ORAL | Status: AC

## 2014-12-16 MED ORDER — ISOSORB DINITRATE-HYDRALAZINE 20-37.5 MG PO TABS: 0.5000 | ORAL_TABLET | Freq: Two times a day (BID) | ORAL | Status: DC

## 2014-12-16 MED ORDER — BUDESONIDE 0.25 MG/2ML IN SUSP: 0.2500 mg | Freq: Two times a day (BID) | RESPIRATORY_TRACT | Status: AC

## 2014-12-16 MED ORDER — POTASSIUM CHLORIDE CRYS ER 20 MEQ PO TBCR: 40.0000 meq | EXTENDED_RELEASE_TABLET | Freq: Every day | ORAL | Status: DC

## 2014-12-16 MED ORDER — FUROSEMIDE 20 MG PO TABS: 40.0000 mg | ORAL_TABLET | Freq: Two times a day (BID) | ORAL | Status: DC

## 2014-12-16 NOTE — Progress Notes (Signed)
[facility] called for transport. Patient & RN, Sonia Baller aware. [facility] Medicare authorization obtained.      Raynaldo Opitz, [facility] Clinical Social Worker cell #: [phone]

## 2014-12-16 NOTE — Progress Notes (Signed)
Patient is set to discharge to [facility] today. Patient & sister, Lelon Frohlich at bedside aware. Discharge packet given to RN, Sonia Baller. [facility] will be called for transport once done with lunch & once [facility] authorization has been obtained.   Clinical Social Work Department CLINICAL SOCIAL WORK PLACEMENT NOTE 12/16/2014  Patient:  Caitlin, Hampton  Account Number:  [id number] Admit date:  12/11/2014  Clinical Social Worker:  Renold Genta  Date/time:  12/13/2014 03:23 PM  Clinical Social Work is seeking post-discharge placement for this patient at the following level of care:   SKILLED NURSING   (*CSW will update this form in Epic as items are completed)   12/13/2014  Patient/family provided with [place] Department of Clinical Social Work's list of facilities offering this level of care within the geographic area requested by the patient (or if unable, by the patient's family).  12/13/2014  Patient/family informed of their freedom to choose among providers that offer the needed level of care, that participate in Medicare, Medicaid or managed care program needed by the patient, have an available bed and are willing to accept the patient.  12/13/2014  Patient/family informed of [facility]' ownership interest in [facility], as well as of the fact that they are under no obligation to receive care at this facility.  PASARR submitted to EDS on 12/13/2014 PASARR number received on 12/13/2014  FL2 transmitted to all facilities in geographic area requested by pt/family on  12/13/2014 FL2 transmitted to all facilities within larger geographic area on   Patient informed that his/her managed care company has contracts with or will negotiate with  certain facilities, including the following:   [facility]     Patient/family informed of bed offers received:  12/14/2014 Patient chooses bed at [facility] Physician recommends and patient chooses bed at     Patient to be transferred to [facility] on  12/16/2014 Patient to be transferred to facility by [facility] Patient and family notified of transfer on 12/16/2014 Name of family member notified:  patient's sister, Lelon Frohlich at bedside  The following physician request were entered in Epic:   Additional Comments:   Raynaldo Opitz, [place] Social Worker cell #: [phone]

## 2014-12-16 NOTE — Discharge Summary (Signed)
Physician Discharge Summary  Caitlin Hampton UUV:[id number] DOB: 28-Oct-1945 DOA: 12/11/2014  PCP: [name], MD  Admit date: 12/11/2014 Discharge date: 12/16/2014  Time spent: >30 minutes  Recommendations for Outpatient Follow-up:  Please repeat CBC in 10 days to follow Hgb trend Repeat CMET during follow up with PCP to reassess LFT's, electrolytes and renal function Recheck BP and adjust antihypertensive therapy as needed Follow HR and if need resume cardizem; patient would benefit of follow up with cardiology in outpatient setting (3 weeks or so)  Discharge Diagnoses:  Active Problems:   Atrial flutter with rapid ventricular response, unknown duration   Tobacco abuse   Obese   Respiratory failure requiring O2, inhalers   Hypertension   COPD (chronic obstructive pulmonary disease)   Hypothyroidism   CHF (congestive heart failure)   Chronic systolic CHF (congestive heart failure)   Acute on chronic respiratory failure   GI bleed   Absolute anemia   Cellulitis of right lower extremity   Sinus bradycardia   Other specified hypotension   Leukocytosis   DVT (deep venous thrombosis)   Acute on chronic diastolic CHF (congestive heart failure)   Acute on chronic renal failure   Chronic respiratory failure   Cellulitis and abscess of leg   HX: anticoagulation   Elevated troponin   Discharge Condition: stable and improved. Will discharge to SNF for rehabilitation. Patient will follow with PCP in approx 10 days after discharge from facility. No CP, no SOB, improved breathing and just mild wheezing at discharge.   Diet recommendation: heart healthy diet  Filed Weights   12/14/14 1238 12/15/14 0500 12/16/14 0541  Weight: 117.1 kg (258 lb 2.5 oz) 115.7 kg (255 lb 1.2 oz) 114.6 kg (252 lb 10.4 oz)    History of present illness:  69 y.o. WF PMHx anxiety, HTN, systolic CHF, atrial fibrillation on anticoagulant, COPD on home oxygen 3 L O2 via [place], hypothyroidism, dry weight  unknown, Pt was trying to walk today when she felt like her leg gave away on her right. It was not painful although she did notice a little soreness to touch in the morning. She does not have any weakness in her right arm. She has several medical problems including COPD. She is on chronic oxygen. At baseline she does have difficulty walking and getting around. She has noticed a gradual worsening though over the last several weeks. No fevers or chills. No vomiting or diarrhea. No back pain. States uses walker and home, increased SOB, negative CP, positive dry cough, LLE lacerations seen by wound care nurse which occurred approximately 2-3 weeks ago. Dressing changed ~ 1 week ago  Hospital Course:  Acute on chronic diastolic CHF -The patient appears clinically improved; still with some swelling, but no crackles and improved O2 sat -discharge on higher dose of lasix and bidil BID -recommended to follow low sodium diet and to check weight on daily basis -Repeated echocardiogram--EF 65-70%, no WMA, PAP = 60 -will require close monitoring of BMET and adjust/reeplete electrolytes as needed  Bradycardia -improving -amiodarone and diltiazem discontinue per cardiology rec's -Appreciate cardiology to help adjustment of the patient's antiarrhythmic medications  Acute and chronic respiratory failure/COPD -secondary to CHF in setting of underlying COPD; normally on 3L at home -will recommend use of flutter valve and pulmicort -continue Bronchodilators  -on doxycycline for potential component of bronchiectasis -Influenza PCR--neg  Atrial fibrillation -Rate controlled--and actually bradycardic -Continue rivaroxaban -per cardiology ok to discontinue amiodarone and cardizem at this moment   Elevated troponin -  Demand ischemia in the setting of worsening renal function and decompensated heart failure -No chest pain at discharge -EKG without any ST-T wave change and 2-D echo without  WMA -continue tx as mentioned above for CHF exacerbation -continue heart healthy diet  Anemia  -Fecal occult blood test neg -Hemoglobin has dropped 4 g since 08/09/2014  -Check anemia panel-->iron sat 4% with ferritin 58 -give IV dose iron and started po iron  Lower extremity pain and edema  -Venous duplex neg for DVT/SVT -continue diuretics and leg elevation -patient will benefit for ted hoses once cellulitis heals    Left lower extremity wound--mild cellulitis -Wound care nurse consultation appreciated; please follow rec's: LLE:  Cleanse with NS, pat gently dry.  Fill/cover defect with saline moistened  Gauze 2x2 (opened),  then cover with petrolatum gauze dressing so that saline dressing does not dry out. Top with dry gauze and secure with Kerlix wrap.  Change twice daily. -will continue therapy with doxycycline to complete antibiotic therapy   Acute on chronic renal failure (CKD2) -Baseline creatinine 0.8-1.1 -due to CHF exacerbation and diuresis -at discharge Cr 0.96 -in an effort to minimize nephrotoxic agents lisinopril has been discontinued -close follow up of renal function in outpatient setting  Deconditioning -PT eval-->recommending SNF -will discharge to SNF for rehabilitation   Procedures:  See below for x-ray reports  2-D echo: EF 65-70%, no WMA, PAP = 60  LE duplex: neg for DVT/SVT  Consultations:  Cardiology   Discharge Exam: Filed Vitals:   12/16/14 0541  BP: 135/40  Pulse: 56  Temp: 98.3 F (36.8 C)  Resp: 19    General: Pt is alert, follows commands appropriately, able to speak in full sentences with O2 sat > 90% on chronic oxygen supplementation. No CP, improve air movement and good urine output. still weak and deconditioned   HEENT: No icterus, No thrush, [place]/AT  Cardiovascular: RRR, S1/S2, no rubs, no gallops  Respiratory: mild exp wheezing; decrease BS at bases; no frank crackles; fair air movement   Abdomen: Soft/+BS, non tender,  non distended, no guarding  Extremities: 2+LE edema, No lymphangitis, No petechiae, No rashes, no synovitis   Discharge Instructions   Discharge Instructions    Diet - low sodium heart healthy    Complete by:  As directed      Discharge instructions    Complete by:  As directed   Take medications as prescribed Needs to follow low sodium diet (less than 2 gram daily) Daily weights (contact physician if weight up by 3 pounds overnight or more than 5 pounds in a week) Repeat BMET in 5 days to follow electrolytes and renal function Doxycycline to be take for 7 more days Please repeat CBC in 10 days to follow Hgb trend No smoking Continue 3-4L [place] oxygen supplementation 24/7     Increase activity slowly    Complete by:  As directed           Current Discharge Medication List    START taking these medications   Details  budesonide (PULMICORT) 0.25 MG/2ML nebulizer solution Take 2 mLs (0.25 mg total) by nebulization 2 (two) times daily. Qty: 60 mL, Refills: 12    doxycycline (VIBRA-TABS) 100 MG tablet Take 1 tablet (100 mg total) by mouth every 12 (twelve) hours. To be taken for 7 days    iron polysaccharides (NIFEREX) 150 MG capsule Take 1 capsule (150 mg total) by mouth 2 (two) times daily.    isosorbide-hydrALAZINE (BIDIL) 20-37.5 MG per  tablet Take 0.5 tablets by mouth 2 (two) times daily.      CONTINUE these medications which have CHANGED   Details  furosemide (LASIX) 20 MG tablet Take 2 tablets (40 mg total) by mouth 2 (two) times daily. Qty: 30 tablet, Refills: 5    potassium chloride SA (K-DUR,KLOR-CON) 20 MEQ tablet Take 2 tablets (40 mEq total) by mouth daily.      CONTINUE these medications which have NOT CHANGED   Details  acetaminophen (TYLENOL) 500 MG tablet Take 1,000 mg by mouth every 6 (six) hours as needed for mild pain.    albuterol (PROVENTIL HFA;VENTOLIN HFA) 108 (90 BASE) MCG/ACT inhaler Inhale 2 puffs into the lungs every 6 (six) hours as needed for  wheezing or shortness of breath.    atorvastatin (LIPITOR) 10 MG tablet Take 10 mg by mouth daily.     levothyroxine (SYNTHROID, LEVOTHROID) 100 MCG tablet Take 100 mcg by mouth daily.    ranitidine (ZANTAC) 75 MG tablet Take 75 mg by mouth daily as needed for heartburn.    Rivaroxaban (XARELTO) 20 MG TABS Take 1 tablet (20 mg total) by mouth daily with breakfast. Qty: 30 tablet, Refills: 11    tiotropium (SPIRIVA) 18 MCG inhalation capsule Place 18 mcg into inhaler and inhale daily.      STOP taking these medications     amiodarone (PACERONE) 400 MG tablet      diltiazem (DILACOR XR) 120 MG 24 hr capsule      lisinopril (PRINIVIL,ZESTRIL) 40 MG tablet        No Known Allergies Follow-up Information    Follow up with [name], MD.   Specialty:  Internal Medicine   Why:  patient to be seen 10 days after discharge from facility    Contact information:   [address] [place] [address] [phone]       The results of significant diagnostics from this hospitalization (including imaging, microbiology, ancillary and laboratory) are listed below for reference.    Significant Diagnostic Studies: Dg Chest 2 View  12/11/2014   CLINICAL DATA:  Right leg pain and weakness.  EXAM: CHEST  2 VIEW  COMPARISON:  03/31/2014 and prior radiographs  FINDINGS: Cardiomegaly and pulmonary vascular congestion identified.  Left hemidiaphragm elevation again noted.  There is no evidence of focal airspace disease, pulmonary edema, suspicious pulmonary nodule/mass, pleural effusion, or pneumothorax. No acute bony abnormalities are identified.  IMPRESSION: Cardiomegaly with pulmonary vascular congestion.   Electronically Signed   By: Margarette Canada M.D.   On: 12/11/2014 16:42   Dg Chest Port 1 View  12/13/2014   CLINICAL DATA:  Acute respiratory failure, hypertension, COPD, CHF, GERD  EXAM: PORTABLE CHEST - 1 VIEW  COMPARISON:  Portable exam 1650 hours compared to 12/11/2014   FINDINGS: Enlargement of cardiac silhouette with pulmonary vascular congestion.  Atherosclerotic calcification aorta.  Hazy interstitial infiltrates in both lungs question minimal failure.  Lordotic positioning.  No gross pleural effusion or pneumothorax.  IMPRESSION: Suspect mild CHF.   Electronically Signed   By: Lavonia Dana M.D.   On: 12/13/2014 17:22    Microbiology: Recent Results (from the past 240 hour(s))  Culture, blood (routine x 2)     Status: None (Preliminary result)   Collection Time: 12/11/14  7:56 PM  Result Value Ref Range Status   Specimen Description BLOOD LEFT ANTECUBITAL  Final   Special Requests BOTTLES DRAWN AEROBIC AND ANAEROBIC 5CC  Final   Culture   Final  BLOOD CULTURE RECEIVED NO GROWTH TO DATE CULTURE WILL BE HELD FOR 5 DAYS BEFORE ISSUING A FINAL NEGATIVE REPORT Performed at [facility]    Report Status PENDING  Incomplete  Culture, blood (routine x 2)     Status: None (Preliminary result)   Collection Time: 12/11/14  7:58 PM  Result Value Ref Range Status   Specimen Description BLOOD RIGHT HAND  Final   Special Requests BOTTLES DRAWN AEROBIC AND ANAEROBIC 5ML  Final   Culture   Final           BLOOD CULTURE RECEIVED NO GROWTH TO DATE CULTURE WILL BE HELD FOR 5 DAYS BEFORE ISSUING A FINAL NEGATIVE REPORT Performed at [facility]    Report Status PENDING  Incomplete     Labs: Basic Metabolic Panel:  Recent Labs Lab 12/11/14 1958 12/12/14 0812 12/13/14 1726 12/14/14 0513 12/15/14 0542 12/16/14 0516  NA  --  137 138 142 143 141  K  --  4.9 4.5 4.6 3.9 3.5  CL  --  103 102 104 102 100  CO2  --  24 25 29 31  33*  GLUCOSE  --  124* 85 102* 96 105*  BUN  --  37* 43* 42* 37* 36*  CREATININE  --  1.33* 1.51* 1.43* 1.18* 0.96  CALCIUM  --  8.5 8.5 8.6 9.0 8.2*  MG 2.6*  --  2.7*  --   --   --    Liver Function Tests:  Recent Labs Lab 12/11/14 1625 12/12/14 0812  AST 36 66*  ALT 67* 70*  ALKPHOS 88 87  BILITOT 1.1  0.9  PROT 6.7 6.8  ALBUMIN 3.7 3.6   CBC:  Recent Labs Lab 12/11/14 1625 12/12/14 0812 12/14/14 0513  WBC 18.1* 14.1* 15.4*  NEUTROABS  --  13.3*  --   HGB 8.9* 8.8* 8.1*  HCT 30.0* 29.2* 27.5*  MCV 93.5 93.0 94.2  PLT 261 209 241   Cardiac Enzymes:  Recent Labs Lab 12/11/14 1958 12/12/14 0115 12/12/14 0800  TROPONINI 0.05* 0.05* 0.05*   BNP: BNP (last 3 results)  Recent Labs  12/11/14 1958  BNP 1060.6*    ProBNP (last 3 results)  Recent Labs  03/31/14 1555 04/01/14 0339 04/25/14 1120  PROBNP 918.7* 993.8* 183.0*    Signed:  Barton Dubois  [facility] 12/16/2014, 11:51 AM

## 2014-12-16 NOTE — Progress Notes (Signed)
Report given to [facility]. Foley removed. IV removed. Pt dressed and ready for transport.

## 2014-12-18 LAB — CULTURE, BLOOD (ROUTINE X 2)
CULTURE: NO GROWTH
Culture: NO GROWTH

## 2014-12-19 ENCOUNTER — Encounter: Payer: Self-pay | Admitting: Internal Medicine

## 2014-12-19 ENCOUNTER — Telehealth: Payer: Self-pay | Admitting: Cardiology

## 2014-12-19 ENCOUNTER — Non-Acute Institutional Stay (SKILLED_NURSING_FACILITY): Payer: Medicare Other | Admitting: Internal Medicine

## 2014-12-19 DIAGNOSIS — J9621 Acute and chronic respiratory failure with hypoxia: Secondary | ICD-10-CM

## 2014-12-19 DIAGNOSIS — L03115 Cellulitis of right lower limb: Secondary | ICD-10-CM

## 2014-12-19 DIAGNOSIS — D509 Iron deficiency anemia, unspecified: Secondary | ICD-10-CM | POA: Diagnosis not present

## 2014-12-19 DIAGNOSIS — R001 Bradycardia, unspecified: Secondary | ICD-10-CM | POA: Diagnosis not present

## 2014-12-19 DIAGNOSIS — N189 Chronic kidney disease, unspecified: Secondary | ICD-10-CM

## 2014-12-19 DIAGNOSIS — N179 Acute kidney failure, unspecified: Secondary | ICD-10-CM

## 2014-12-19 DIAGNOSIS — I5033 Acute on chronic diastolic (congestive) heart failure: Secondary | ICD-10-CM

## 2014-12-19 DIAGNOSIS — R600 Localized edema: Secondary | ICD-10-CM | POA: Diagnosis not present

## 2014-12-19 DIAGNOSIS — R7989 Other specified abnormal findings of blood chemistry: Secondary | ICD-10-CM

## 2014-12-19 DIAGNOSIS — R778 Other specified abnormalities of plasma proteins: Secondary | ICD-10-CM

## 2014-12-19 DIAGNOSIS — I4892 Unspecified atrial flutter: Secondary | ICD-10-CM

## 2014-12-19 NOTE — Progress Notes (Signed)
Discharge summary sent to payer through [facility]  

## 2014-12-19 NOTE — Progress Notes (Signed)
MRN: [id number] Name: Caitlin Hampton  Sex: female Age: 69 y.o. DOB: [birth year]  [place] #: [name] Facility/Room: 308 Level Of Care: SNF Provider: Inocencio Homes D Emergency Contacts: Extended Emergency Contact Information Primary Emergency Contact: Causey,Ann Address: [address]          [place], [place] [address] [place] Phone: [phone] Mobile Phone: [phone] Relation: Sister Secondary Emergency Contact: Caviness,Bill Address: [address]           [place], [place] [address] [place] Phone: [phone] Mobile Phone: [phone] Relation: Brother  Code Status:   Allergies: Review of patient's allergies indicates no known allergies.  Chief Complaint  Patient presents with  . New Admit To SNF    HPI: Patient is 69 y.o. female who is admitted to SNF for generalized weakness after hospitalized for acute resp failure 2/2 acute exacerbation CHF.  Past Medical History  Diagnosis Date  . Hypertension   . Thyroid disease   . COPD (chronic obstructive pulmonary disease)   . Anxiety   . H/O hiatal hernia   . Hypothyroidism   . Shortness of breath   . Atrial flutter 09/07/2012    , RVR/ hospital 12/13- rate control and Echo normal- on xarelto, when spontaneously converted she had bradycardia on Metoprolol and Digoxin  . GERD (gastroesophageal reflux disease)   . Rhinitis   . Parotitis     , on CT of face- 12/13  . CHF (congestive heart failure)   . Hx of echocardiogram     Echo (8/15):  EF 60-65%  . NSTEMI (non-ST elevated myocardial infarction)     Past Surgical History  Procedure Laterality Date  . Tonsillectomy    . Breast surgery      benign R brest CA  . Cardioversion N/A 04/02/2014    Procedure: CARDIOVERSION;  Surgeon: Josue Hector, MD;  Location: [facility];  Service: Cardiovascular;  Laterality: N/A;  . Cardioversion N/A 08/17/2014    Procedure: CARDIOVERSION;  Surgeon: [name], MD;  Location: [facility] ENDOSCOPY;   Service: Cardiovascular;  Laterality: N/A;      Medication List       This list is accurate as of: 12/19/14 11:59 PM.  Always use your most recent med list.               acetaminophen 500 MG tablet  Commonly known as:  TYLENOL  Take 1,000 mg by mouth every 6 (six) hours as needed for mild pain.     albuterol 108 (90 BASE) MCG/ACT inhaler  Commonly known as:  PROVENTIL HFA;VENTOLIN HFA  Inhale 2 puffs into the lungs every 6 (six) hours as needed for wheezing or shortness of breath.     atorvastatin 10 MG tablet  Commonly known as:  LIPITOR  Take 10 mg by mouth daily.     budesonide 0.25 MG/2ML nebulizer solution  Commonly known as:  PULMICORT  Take 2 mLs (0.25 mg total) by nebulization 2 (two) times daily.     doxycycline 100 MG tablet  Commonly known as:  VIBRA-TABS  Take 1 tablet (100 mg total) by mouth every 12 (twelve) hours. To be taken for 7 days     furosemide 20 MG tablet  Commonly known as:  LASIX  Take 2 tablets (40 mg total) by mouth 2 (two) times daily.     iron polysaccharides 150 MG capsule  Commonly known as:  NIFEREX  Take 1 capsule (150 mg total) by mouth 2 (two)  times daily.     isosorbide-hydrALAZINE 20-37.5 MG per tablet  Commonly known as:  BIDIL  Take 0.5 tablets by mouth 2 (two) times daily.     levothyroxine 100 MCG tablet  Commonly known as:  SYNTHROID, LEVOTHROID  Take 100 mcg by mouth daily.     potassium chloride SA 20 MEQ tablet  Commonly known as:  K-DUR,KLOR-CON  Take 2 tablets (40 mEq total) by mouth daily.     ranitidine 75 MG tablet  Commonly known as:  ZANTAC  Take 75 mg by mouth daily as needed for heartburn.     rivaroxaban 20 MG Tabs tablet  Commonly known as:  XARELTO  Take 1 tablet (20 mg total) by mouth daily with breakfast.     tiotropium 18 MCG inhalation capsule  Commonly known as:  SPIRIVA  Place 18 mcg into inhaler and inhale daily.        No orders of the defined types were placed in this encounter.      There is no immunization history on file for this patient.  History  Substance Use Topics  . Smoking status: Former Smoker -- 1.50 packs/day    Types: Cigarettes    Quit date: 07/23/2012  . Smokeless tobacco: Not on file  . Alcohol Use: No     Comment: occassionally    Family history is noncontributory    Review of Systems  DATA OBTAINED: from patient, nurse GENERAL:  no fevers,+ fatigue, appetite changes SKIN: No itching, rash or wounds EYES: No eye pain, redness, discharge EARS: No earache, tinnitus, change in hearing NOSE: No congestion, drainage or bleeding  MOUTH/THROAT: No mouth or tooth pain, No sore throat RESPIRATORY: No cough, wheezing, SOB CARDIAC: No chest pain, palpitations, lower extremity edema  GI: No abdominal pain, No N/V/D or constipation, No heartburn or reflux  GU: No dysuria, frequency or urgency, or incontinence  MUSCULOSKELETAL: No unrelieved bone/joint pain NEUROLOGIC: No headache, dizziness or focal weakness PSYCHIATRIC: No overt anxiety or sadness, No behavior issue.   Filed Vitals:   12/19/14 1224  BP: 119/86  Pulse: 56  Temp: 98.6 F (37 C)  Resp: 18    Physical Exam  GENERAL APPEARANCE: Alert, conversant,  No acute distress.  SKIN: No diaphoresis rash HEAD: Normocephalic, atraumatic  EYES: Conjunctiva/lids clear. Pupils round, reactive. EOMs intact.  EARS: External exam WNL, canals clear. Hearing grossly normal.  NOSE: No deformity or discharge.  MOUTH/THROAT: Lips w/o lesions  RESPIRATORY: Breathing is even, unlabored. Lung sounds are clear   CARDIOVASCULAR: Heart RRR no murmurs, rubs or gallops. Trace peripheral edema.   GASTROINTESTINAL: Abdomen is soft, non-tender, not distended w/ normal bowel sounds. GENITOURINARY: Bladder non tender, not distended  MUSCULOSKELETAL: No abnormal joints or musculature NEUROLOGIC:  Cranial nerves 2-12 grossly intact. Moves all extremities  PSYCHIATRIC: Mood and affect appropriate to  situation, no behavioral issues  Patient Active Problem List   Diagnosis Date Noted  . Lower extremity edema 12/26/2014  . Acute on chronic diastolic CHF (congestive heart failure) 12/12/2014  . Acute on chronic renal failure 12/12/2014  . Chronic respiratory failure 12/12/2014  . Cellulitis and abscess of leg 12/12/2014  . HX: anticoagulation 12/12/2014  . Elevated troponin 12/12/2014  . Chronic systolic CHF (congestive heart failure) 12/11/2014  . Acute on chronic respiratory failure 12/11/2014  . GI bleed 12/11/2014  . Anemia, iron deficiency   . Cellulitis of right lower extremity   . Sinus bradycardia   . Other specified hypotension   .  Leukocytosis   . DVT (deep venous thrombosis)   . Acute renal failure (ARF) 04/03/2014  . Hypertension   . COPD (chronic obstructive pulmonary disease)   . Hypothyroidism   . Shortness of breath   . CHF (congestive heart failure)   . Respiratory failure requiring O2, inhalers 09/09/2012  . COPD with exacerbation 09/09/2012  . Acute diastolic CHF (congestive heart failure) 09/09/2012  . Obese 09/08/2012  . Atrial flutter with rapid ventricular response, unknown duration 09/07/2012  . Tobacco abuse 09/07/2012  . HTN (hypertension), LVH with NL LVF 09/07/2012  . 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 Parotiditis, with lt facial swelling, on ABs 09/07/2012  . Atrial flutter 09/07/2012    CBC    Component Value Date/Time   WBC 15.4* 12/14/2014 0513   RBC 2.92* 12/14/2014 0513   RBC 3.17* 12/12/2014 1104   HGB 8.1* 12/14/2014 0513   HCT 27.5* 12/14/2014 0513   PLT 241 12/14/2014 0513   MCV 94.2 12/14/2014 0513   LYMPHSABS 0.4* 12/12/2014 0812   MONOABS 0.4 12/12/2014 0812   EOSABS 0.0 12/12/2014 0812   BASOSABS 0.0 12/12/2014 0812    CMP     Component Value Date/Time   NA 141 12/16/2014 0516   K 3.5 12/16/2014 0516   CL 100 12/16/2014 0516   CO2 33* 12/16/2014 0516   GLUCOSE 105*  12/16/2014 0516   BUN 36* 12/16/2014 0516   CREATININE 0.96 12/16/2014 0516   CALCIUM 8.2* 12/16/2014 0516   PROT 6.8 12/12/2014 0812   ALBUMIN 3.6 12/12/2014 0812   AST 66* 12/12/2014 0812   ALT 70* 12/12/2014 0812   ALKPHOS 87 12/12/2014 0812   BILITOT 0.9 12/12/2014 0812   GFRNONAA 59* 12/16/2014 0516   GFRAA 68* 12/16/2014 0516    Assessment and Plan  Acute on chronic diastolic CHF (congestive heart failure) The patient appears clinically improved; still with some swelling, but no crackles and improved O2 sat -discharge on higher dose of lasix and bidil BID -recommended to follow low sodium diet and to check weight on daily basis -Repeated echocardiogram--EF 65-70%, no WMA, PAP = 60   Sinus bradycardia improving -amiodarone and diltiazem discontinue per cardiology rec's -Appreciate cardiology to help adjustment of the patient's antiarrhythmic medications   Acute on chronic respiratory failure secondary to CHF in setting of underlying COPD; normally on 3L at home -will recommend use of flutter valve and pulmicort -continue Bronchodilators  -on doxycycline for potential component of bronchiectasis -Influenza PCR--neg   Atrial flutter with rapid ventricular response, unknown duration Rate controlled--and actually bradycardic -Continue rivaroxaban -per cardiology ok to discontinue amiodarone and cardizem at this moment    Anemia, iron deficiency Rate controlled--and actually bradycardic -Continue rivaroxaban -per cardiology ok to discontinue amiodarone and cardizem at this moment    Lower extremity edema Venous duplex neg for DVT/SVT -continue diuretics and leg elevation -patient will benefit for ted hoses once cellulitis heals     Cellulitis of right lower extremity Wound care nurse consultation appreciated; please follow rec's: LLE: Cleanse with NS, pat gently dry. Fill/cover defect with saline moistened Gauze 2x2 (opened), then cover with petrolatum  gauze dressing so that saline dressing does not dry out. Top with dry gauze and secure with Kerlix wrap. Change twice daily. -will continue therapy with doxycycline to complete antibiotic therapy      Acute on chronic renal failure Baseline creatinine 0.8-1.1 -due to CHF exacerbation and diuresis -at discharge Cr 0.96 -in an  effort to minimize nephrotoxic agents lisinopril has been discontinued -close follow up of renal function in outpatient setting    Elevated troponin -Demand ischemia in the setting of worsening renal function and decompensated heart failure -No chest pain at discharge -EKG without any ST-T wave change and 2-D echo without WMA -continue tx as mentioned above for CHF exacerbation -continue heart healthy diet     Hennie Duos, MD

## 2014-12-19 NOTE — Telephone Encounter (Signed)
New message        Pt sister states pt is in rehab and is not able to be brought in for care

## 2014-12-19 NOTE — Telephone Encounter (Signed)
Left message for Caitlin Hampton that I did receive her message.  Pt has an appt 6/3 @ 2:15 pm with Dr Marlou Porch which we will leave for now.  Requested she call back with any questions or concerns.

## 2014-12-20 ENCOUNTER — Telehealth: Payer: Self-pay | Admitting: Cardiology

## 2014-12-20 NOTE — Telephone Encounter (Signed)
Dr. Marlou Porch responded to have patient come in prior to scheduled June appointment. Patient's sister had indicated to leave a detailed message with date/time, if applicable. Appointment scheduled for January 11, 2015 at 1345 with Dr. Marlou Porch. Detailed message left on home and cell phone, per request. Will also mail letter to home address of patient. (completed)

## 2014-12-20 NOTE — Telephone Encounter (Signed)
NEw Message  Pt sister calling to speak w/ RN about pt's June appt, and wanted to confirm if appt needed to be earlier. Please call back and discuss.

## 2014-12-20 NOTE — Telephone Encounter (Signed)
Have her come in.  [name], MD

## 2014-12-20 NOTE — Telephone Encounter (Signed)
[name], sister of patient [facility]) called to ask if Dr. Marlou Porch needs to see patient before June appointment, in light of the recent turn of events and the patient being placed in [facility] for rehabilitation. Patient to be scheduled to see PCP, Dr. Deforest Hoyles, within 10 days post discharge. Discharged on 12/16/14 from hospital straight to [facility]. Sister will check if this appt is in place yet. Routed to Dr. Marlou Porch.

## 2014-12-21 ENCOUNTER — Telehealth: Payer: Self-pay | Admitting: Cardiology

## 2014-12-21 NOTE — Telephone Encounter (Addendum)
error 

## 2014-12-26 ENCOUNTER — Encounter: Payer: Self-pay | Admitting: Internal Medicine

## 2014-12-26 DIAGNOSIS — R600 Localized edema: Secondary | ICD-10-CM | POA: Insufficient documentation

## 2014-12-26 NOTE — Assessment & Plan Note (Signed)
Rate controlled--and actually bradycardic -Continue rivaroxaban -per cardiology ok to discontinue amiodarone and cardizem at this moment

## 2014-12-26 NOTE — Assessment & Plan Note (Signed)
Wound care nurse consultation appreciated; please follow rec's: LLE: Cleanse with NS, pat gently dry. Fill/cover defect with saline moistened Gauze 2x2 (opened), then cover with petrolatum gauze dressing so that saline dressing does not dry out. Top with dry gauze and secure with Kerlix wrap. Change twice daily. -will continue therapy with doxycycline to complete antibiotic therapy

## 2014-12-26 NOTE — Assessment & Plan Note (Signed)
Venous duplex neg for DVT/SVT -continue diuretics and leg elevation -patient will benefit for ted hoses once cellulitis heals

## 2014-12-26 NOTE — Assessment & Plan Note (Signed)
improving -amiodarone and diltiazem discontinue per cardiology rec's -Appreciate cardiology to help adjustment of the patient's antiarrhythmic medications

## 2014-12-26 NOTE — Assessment & Plan Note (Signed)
Baseline creatinine 0.8-1.1 -due to CHF exacerbation and diuresis -at discharge Cr 0.96 -in an effort to minimize nephrotoxic agents lisinopril has been discontinued -close follow up of renal function in outpatient setting

## 2014-12-26 NOTE — Assessment & Plan Note (Signed)
secondary to CHF in setting of underlying COPD; normally on 3L at home -will recommend use of flutter valve and pulmicort -continue Bronchodilators  -on doxycycline for potential component of bronchiectasis -Influenza PCR--neg

## 2014-12-26 NOTE — Assessment & Plan Note (Signed)
The patient appears clinically improved; still with some swelling, but no crackles and improved O2 sat -discharge on higher dose of lasix and bidil BID -recommended to follow low sodium diet and to check weight on daily basis -Repeated echocardiogram--EF 65-70%, no WMA, PAP = 60

## 2014-12-26 NOTE — Assessment & Plan Note (Signed)
-  Demand ischemia in the setting of worsening renal function and decompensated heart failure -No chest pain at discharge -EKG without any ST-T wave change and 2-D echo without WMA -continue tx as mentioned above for CHF exacerbation -continue heart healthy diet

## 2015-01-11 ENCOUNTER — Ambulatory Visit: Payer: Medicare Other | Admitting: Cardiology

## 2015-01-17 ENCOUNTER — Non-Acute Institutional Stay (SKILLED_NURSING_FACILITY): Payer: Medicare Other | Admitting: Nurse Practitioner

## 2015-01-17 DIAGNOSIS — I4892 Unspecified atrial flutter: Secondary | ICD-10-CM | POA: Diagnosis not present

## 2015-01-17 DIAGNOSIS — J438 Other emphysema: Secondary | ICD-10-CM | POA: Diagnosis not present

## 2015-01-17 DIAGNOSIS — L03115 Cellulitis of right lower limb: Secondary | ICD-10-CM | POA: Diagnosis not present

## 2015-01-17 DIAGNOSIS — I5033 Acute on chronic diastolic (congestive) heart failure: Secondary | ICD-10-CM

## 2015-01-17 DIAGNOSIS — D509 Iron deficiency anemia, unspecified: Secondary | ICD-10-CM

## 2015-01-17 DIAGNOSIS — I10 Essential (primary) hypertension: Secondary | ICD-10-CM | POA: Diagnosis not present

## 2015-01-17 NOTE — Progress Notes (Signed)
Patient ID: Caitlin Hampton, female   DOB: 11/25/1945, 69 y.o.   MRN: [id number]    Nursing Home Location:  [facility]   Place of Service: SNF (31)  PCP: [name], MD  No Known Allergies  Chief Complaint  Patient presents with  . Discharge Note    HPI:  Patient is a 69 y.o. female seen today at [facility] for discharge home. pmh of anxiety, HTN, systolic CHF, atrial fibrillation on anticoagulant, COPD on home oxygen 3 L, hypothyroidism.Pt was admitted to the hospital due to cellulitis along with COPD and CHF exacerbation. Pt transferred to [facility] for ongoing rehab. Patient currently doing well with therapy, now stable to discharge home with home health and close outpt follow up.   Review of Systems:  Review of Systems DATA OBTAINED: from patient  GENERAL: no fevers, appetite changes, activity at baseline SKIN: No itching or rash; wounds improved to LE EYES: No eye pain, redness, discharge MOUTH/THROAT: No mouth or tooth pain, No sore throat RESPIRATORY: No cough, wheezing, SOB with activity but overall has improved since hospitalization.  CARDIAC: No chest pain, palpitations, lower extremity edema  GI: No abdominal pain, No N/V/D; mild constipation, No heartburn or reflux  GU: No dysuria, frequency or urgency, or incontinence  MUSCULOSKELETAL: No bone/joint pain NEUROLOGIC: No headache, dizziness or focal weakness PSYCHIATRIC: No overt anxiety or sadness, No behavior issue.   Past Medical History  Diagnosis Date  . Hypertension   . Thyroid disease   . COPD (chronic obstructive pulmonary disease)   . Anxiety   . H/O hiatal hernia   . Hypothyroidism   . Shortness of breath   . Atrial flutter 09/07/2012    , RVR/ hospital 12/13- rate control and Echo normal- on xarelto, when spontaneously converted she had bradycardia on Metoprolol and Digoxin  . GERD (gastroesophageal reflux disease)   . Rhinitis   . Parotitis     , on CT of  face- 12/13  . CHF (congestive heart failure)   . Hx of echocardiogram     Echo (8/15):  EF 60-65%  . NSTEMI (non-ST elevated myocardial infarction)    Past Surgical History  Procedure Laterality Date  . Tonsillectomy    . Breast surgery      benign R brest CA  . Cardioversion N/A 04/02/2014    Procedure: CARDIOVERSION;  Surgeon: Josue Hector, MD;  Location: [place];  Service: Cardiovascular;  Laterality: N/A;  . Cardioversion N/A 08/17/2014    Procedure: CARDIOVERSION;  Surgeon: [name], MD;  Location: [facility] ENDOSCOPY;  Service: Cardiovascular;  Laterality: N/A;   Social History:   reports that she quit smoking about 2 years ago. Her smoking use included Cigarettes. She smoked 1.50 packs per day. She does not have any smokeless tobacco history on file. She reports that she does not drink alcohol or use illicit drugs.  Family History  Problem Relation Age of Onset  . CAD Mother   . Stroke Mother   . Prostate cancer Father   . Dementia Father     Medications: Patient's Medications  New Prescriptions   No medications on file  Previous Medications   ACETAMINOPHEN (TYLENOL) 500 MG TABLET    Take 1,000 mg by mouth every 6 (six) hours as needed for mild pain.   ALBUTEROL (PROVENTIL HFA;VENTOLIN HFA) 108 (90 BASE) MCG/ACT INHALER    Inhale 2 puffs into the lungs every 6 (six) hours as needed for wheezing or shortness of breath.  ATORVASTATIN (LIPITOR) 10 MG TABLET    Take 10 mg by mouth daily.    BUDESONIDE (PULMICORT) 0.25 MG/2ML NEBULIZER SOLUTION    Take 2 mLs (0.25 mg total) by nebulization 2 (two) times daily.   FUROSEMIDE (LASIX) 20 MG TABLET    Take 2 tablets (40 mg total) by mouth 2 (two) times daily.   IRON POLYSACCHARIDES (NIFEREX) 150 MG CAPSULE    Take 1 capsule (150 mg total) by mouth 2 (two) times daily.   ISOSORBIDE-HYDRALAZINE (BIDIL) 20-37.5 MG PER TABLET    Take 0.5 tablets by mouth 2 (two) times daily.   LEVOTHYROXINE (SYNTHROID, LEVOTHROID) 100 MCG TABLET    Take  100 mcg by mouth daily.   POTASSIUM CHLORIDE SA (K-DUR,KLOR-CON) 20 MEQ TABLET    Take 2 tablets (40 mEq total) by mouth daily.   RANITIDINE (ZANTAC) 75 MG TABLET    Take 75 mg by mouth daily as needed for heartburn.   RIVAROXABAN (XARELTO) 20 MG TABS    Take 1 tablet (20 mg total) by mouth daily with breakfast.   TIOTROPIUM (SPIRIVA) 18 MCG INHALATION CAPSULE    Place 18 mcg into inhaler and inhale daily.  Modified Medications   No medications on file  Discontinued Medications   DOXYCYCLINE (VIBRA-TABS) 100 MG TABLET    Take 1 tablet (100 mg total) by mouth every 12 (twelve) hours. To be taken for 7 days     Physical Exam: Filed Vitals:   01/17/15 1504  BP: 162/80  Pulse: 85  Temp: 98.2 F (36.8 C)  Resp: 20  Weight: 229 lb (103.874 kg)    Physical Exam GENERAL APPEARANCE: Alert, conversant, No acute distress.  SKIN: No diaphoresis or rash, legs wrapped  HEAD: Normocephalic, atraumatic  EYES: Conjunctiva/lids clear. Pupils round, reactive. EOMs intact.  EARS: Hearing grossly normal.  NOSE: No deformity or discharge.  MOUTH/THROAT: Lips w/o lesions  RESPIRATORY: Breathing is even, unlabored. Lung sounds are diminished CARDIOVASCULAR: Heart RRR no murmurs, rubs or gallops. Trace peripheral edema.  GASTROINTESTINAL: Abdomen is soft, non-tender, not distended w/ normal bowel sounds. Has umbilical hernia, pt reports this has been unchanged.  GENITOURINARY: Bladder non tender, not distended  MUSCULOSKELETAL: No abnormal joints or musculature NEUROLOGIC: Cranial nerves 2-12 grossly intact. Moves all extremities  PSYCHIATRIC: Mood and affect appropriate to situation, no behavioral issues Labs reviewed: Basic Metabolic Panel:  Recent Labs  12/11/14 1958  12/13/14 1726 12/14/14 0513 12/15/14 0542 12/16/14 0516  NA  --   < > 138 142 143 141  K  --   < > 4.5 4.6 3.9 3.5  CL  --   < > 102 104 102 100  CO2  --   < > 25 29 31  33*  GLUCOSE  --   < > 85 102* 96 105*    BUN  --   < > 43* 42* 37* 36*  CREATININE  --   < > 1.51* 1.43* 1.18* 0.96  CALCIUM  --   < > 8.5 8.6 9.0 8.2*  MG 2.6*  --  2.7*  --   --   --   < > = values in this interval not displayed. Liver Function Tests:  Recent Labs  03/31/14 1555 12/11/14 1625 12/12/14 0812  AST 18 36 66*  ALT 14 67* 70*  ALKPHOS 83 88 87  BILITOT 0.4 1.1 0.9  PROT 7.5 6.7 6.8  ALBUMIN 4.1 3.7 3.6   No results for input(s): LIPASE, AMYLASE in the last 8760 hours.  No results for input(s): AMMONIA in the last 8760 hours. CBC:  Recent Labs  03/31/14 1555  12/11/14 1625 12/12/14 0812 12/14/14 0513  WBC 9.5  < > 18.1* 14.1* 15.4*  NEUTROABS 7.4  --   --  13.3*  --   HGB 12.0  < > 8.9* 8.8* 8.1*  HCT 38.5  < > 30.0* 29.2* 27.5*  MCV 96.0  < > 93.5 93.0 94.2  PLT 225  < > 261 209 241  < > = values in this interval not displayed. TSH:  Recent Labs  04/01/14 0339 12/11/14 1958  TSH 1.220 0.632   A1C: Lab Results  Component Value Date   HGBA1C 5.8* 09/07/2012   Lipid Panel: No results for input(s): CHOL, HDL, LDLCALC, TRIG, CHOLHDL, LDLDIRECT in the last 8760 hours.  CBC NO Diff (Complete Blood Count)    Result: 01/03/2015 5:27 PM   ( Status: F )       WBC 9.3     4.0-10.5 K/uL SLN   RBC 3.22   L 3.87-5.11 MIL/uL SLN   Hemoglobin 8.7   L 12.0-15.0 g/dL SLN   Hematocrit 28.1   L 36.0-46.0 % SLN   MCV 87.3     78.0-100.0 fL SLN   MCH 27.0     26.0-34.0 pg SLN   MCHC 31.0     30.0-36.0 g/dL SLN   RDW 19.2   H 11.5-15.5 % SLN   Platelet Count 328     150-400 K/uL SLN   MPV 10.0     8.6-12.4 fL SLN   Basic Metabolic Panel    Result: 01/03/2015 4:51 PM   ( Status: F )       Sodium 136     135-145 mEq/L SLN   Potassium 4.4     3.5-5.3 mEq/L SLN   Chloride 98     96-112 mEq/L SLN   CO2 30     19-32 mEq/L SLN   Glucose 106   H 70-99 mg/dL SLN   BUN 16     6-23 mg/dL SLN   Creatinine 1.35   H 0.50-1.10 mg/dL SLN   Calcium 8.5  Assessment/Plan 1. Essential hypertension -blood  pressures review and sbp ranges from 110-160s/70-80. Will cont current regimen at this time  2. Atrial flutter with rapid ventricular response, unknown duration Rate controlled, Continues on rivaroxaban  3. Acute on chronic diastolic CHF (congestive heart failure) -improved, conts on lasix and bidil -weights have been stable while at [facility], LE edema improved   4. Other emphysema Remains on long term O2, conts pulmicort and PRN medication  5. Anemia, iron deficiency conts iron, will need out pt follow up. Last hgb at [facility] was 8.7 which is stable from hospital.   6. Cellulitis of right lower extremity Completed course of doxycycline, overall improve however still with wounds followed by wound care at [facility]. Will have [place] provided for ongoing home care.    pt is stable for discharge-will need PT/OT/Nursing per home health. DME and Rx written.  will need to follow up with PCP within 2 weeks.

## 2015-02-06 ENCOUNTER — Ambulatory Visit (INDEPENDENT_AMBULATORY_CARE_PROVIDER_SITE_OTHER): Payer: Medicare Other | Admitting: Cardiology

## 2015-02-06 ENCOUNTER — Encounter: Payer: Self-pay | Admitting: Cardiology

## 2015-02-06 VITALS — BP 112/74 | HR 101 | Ht 64.0 in | Wt 232.4 lb

## 2015-02-06 DIAGNOSIS — I5032 Chronic diastolic (congestive) heart failure: Secondary | ICD-10-CM | POA: Diagnosis not present

## 2015-02-06 DIAGNOSIS — I4892 Unspecified atrial flutter: Secondary | ICD-10-CM | POA: Diagnosis not present

## 2015-02-06 DIAGNOSIS — J438 Other emphysema: Secondary | ICD-10-CM | POA: Diagnosis not present

## 2015-02-06 NOTE — Progress Notes (Signed)
Caitlin Hampton. [address], [place]  [address] Phone: [phone] Fax:  [phone]  Date:  02/06/2015   ID:  Caitlin, Hampton 23-Jul-1946, MRN [id number]  PCP:  [name], MD   History of Present Illness: Caitlin Hampton is a 69 y.o. female COPD on home O2, atrial flutter currently on anticoagulation with Xarelto.  She was hospitalized on 12/13 for rapid ventricular response atrial fibrillation. And in nursing home in April 2016 for cellulitis of right lower extremity. Home health has been seeing her.  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 Shortness of breath with activity. She has had some weight gain but she admits to dietary indiscretion.  Wears oxygen at baseline.  11/11/14-cardioversion on 08/17/14 was successful. She has fallen on 10/27/14, extremity laceration.   Quite stable. Doing well. BiDil was very expensive. It was stopped on 02/06/15.  Wt Readings from Last 3 Encounters:  02/06/15 232 lb 6.4 oz (105.416 kg)  01/17/15 229 lb (103.874 kg)  12/16/14 252 lb 10.4 oz (114.6 kg)     Past Medical History  Diagnosis Date  . Hypertension   . Thyroid disease   . COPD (chronic obstructive pulmonary disease)   . Anxiety   . H/O hiatal hernia   . Hypothyroidism   . Shortness of breath   . Atrial flutter 09/07/2012    , RVR/ hospital 12/13- rate control and Echo normal- on xarelto, when spontaneously converted she had bradycardia on Metoprolol and Digoxin  . GERD (gastroesophageal reflux disease)   . Rhinitis   . Parotitis     , on CT of face- 12/13  . CHF (congestive heart failure)   . Hx of echocardiogram     Echo (8/15):  EF 60-65%  . NSTEMI (non-ST elevated myocardial infarction)     Past Surgical History  Procedure Laterality Date  . Tonsillectomy    . Breast surgery      benign R brest CA  . Cardioversion N/A 04/02/2014    Procedure: CARDIOVERSION;  Surgeon: Josue Hector, MD;  Location: [place];  Service:  Cardiovascular;  Laterality: N/A;  . Cardioversion N/A 08/17/2014    Procedure: CARDIOVERSION;  Surgeon: [name], MD;  Location: [facility] ENDOSCOPY;  Service: Cardiovascular;  Laterality: N/A;    Current Outpatient Prescriptions  Medication Sig Dispense Refill  . acetaminophen (TYLENOL) 500 MG tablet Take 1,000 mg by mouth every 6 (six) hours as needed for mild pain.    Marland Kitchen albuterol (PROVENTIL HFA;VENTOLIN HFA) 108 (90 BASE) MCG/ACT inhaler Inhale 2 puffs into the lungs every 6 (six) hours as needed for wheezing or shortness of breath.    Marland Kitchen albuterol (PROVENTIL) (2.5 MG/3ML) 0.083% nebulizer solution Take 2.5 mg by nebulization every 6 (six) hours as needed for wheezing or shortness of breath.    Marland Kitchen atorvastatin (LIPITOR) 10 MG tablet Take 10 mg by mouth daily.     . budesonide (PULMICORT) 0.25 MG/2ML nebulizer solution Take 2 mLs (0.25 mg total) by nebulization 2 (two) times daily. 60 mL 12  . diltiazem (CARDIZEM CD) 120 MG 24 hr capsule Take 120 mg by mouth 2 (two) times daily.  0  . furosemide (LASIX) 20 MG tablet Take 2 tablets (40 mg total) by mouth 2 (two) times daily. 30 tablet 5  . iron polysaccharides (NIFEREX) 150 MG capsule Take 1 capsule (150 mg total) by mouth 2 (two) times daily.    . isosorbide-hydrALAZINE (BIDIL) 20-37.5 MG  per tablet Take 0.5 tablets by mouth 2 (two) times daily.    Marland Kitchen levothyroxine (SYNTHROID, LEVOTHROID) 100 MCG tablet Take 100 mcg by mouth daily.    Marland Kitchen lisinopril (PRINIVIL,ZESTRIL) 40 MG tablet Take 40 mg by mouth daily.  8  . potassium chloride SA (K-DUR,KLOR-CON) 20 MEQ tablet Take 2 tablets (40 mEq total) by mouth daily.    . ranitidine (ZANTAC) 75 MG tablet Take 75 mg by mouth daily as needed for heartburn.    . Rivaroxaban (XARELTO) 20 MG TABS Take 1 tablet (20 mg total) by mouth daily with breakfast. 30 tablet 11  . tiotropium (SPIRIVA) 18 MCG inhalation capsule Place 18 mcg into inhaler and inhale daily.     No current facility-administered medications for  this visit.    Allergies:   No Known Allergies  Social History:  The patient  reports that she quit smoking about 2 years ago. Her smoking use included Cigarettes. She smoked 1.50 packs per day. She does not have any smokeless tobacco history on file. She reports that she does not drink alcohol or use illicit drugs.   ROS:  Please see the history of present illness.   Denies any new breathing issues, syncope, orthopnea, PND    PHYSICAL EXAM: VS:  BP 112/74 mmHg  Pulse 101  Ht 5\' 4"  (1.626 m)  Wt 232 lb 6.4 oz (105.416 kg)  BMI 39.87 kg/m2  SpO2 93% Well nourished, well developed, in no acute distressWheelchair HEENT: normalHome oxygen. Neck: no JVD Cardiac:  Reg, tachycardic; no murmur Lungs:  clear to auscultation bilaterally, no wheezing, rhonchi or rales Abd: soft, nontender, no hepatomegaly obesity Ext: 1 plus bilateral lower extremity edema. Skin: warm and dry Neuro: no focal abnormalities noted  EKG:  08/09/14-atrial flutter with 2-1 conduction heart rate 114 bpm     11/11/14-poor quality EKG, heart rate 68 bpm, possibly sinus rhythm.  ASSESSMENT AND PLAN:  1. Atrial flutter- he has been paroxysmal. Amiodarone has been stopped since last hospitalization. She was having some significant bradycardia during that time. We will stay away from amiodarone. Severe lung disease. Obesity., It will be challenging to maintain rhythm control. She is quite comfortable currently in rate control. Continue with diltiazem anticoagulation. 2. Obesity-continue to encourage weight loss. 3. COPD/hypoxia-home oxygen, likely driving her dyspnea. 4. Diastolic heart failure-currently fairly well compensated.  5. 6 month follow up.  Signed, [name], MD [facility]  02/06/2015 1:43 PM

## 2015-02-06 NOTE — Patient Instructions (Addendum)
Medication Instructions:  Please stop your Bidil. Continue all other medications as listed.  Follow-Up: Follow up in 6 months with Dr. Marlou Porch.  You will receive a letter in the mail 2 months before you are due.  Please call us when you receive this letter to schedule your follow up appointment.  Thank you for choosing [facility]!!

## 2015-02-24 ENCOUNTER — Ambulatory Visit: Payer: Medicare Other | Admitting: Cardiology

## 2015-06-29 ENCOUNTER — Other Ambulatory Visit: Payer: Self-pay | Admitting: Cardiology

## 2015-07-07 ENCOUNTER — Other Ambulatory Visit: Payer: Self-pay | Admitting: Cardiology

## 2015-07-07 NOTE — Telephone Encounter (Signed)
ASSESSMENT AND PLAN:  1. Atrial flutter- he has been paroxysmal. Amiodarone has been stopped since last hospitalization. She was having some significant bradycardia during that time. We will stay away from amiodarone.

## 2015-07-20 ENCOUNTER — Ambulatory Visit
Admission: RE | Admit: 2015-07-20 | Discharge: 2015-07-20 | Disposition: A | Discharge status: Home / Self Care | Payer: Medicare Other | Source: Ambulatory Visit | Attending: Internal Medicine | Admitting: Internal Medicine

## 2015-07-20 DIAGNOSIS — Z1231 Encounter for screening mammogram for malignant neoplasm of breast: Secondary | ICD-10-CM

## 2015-07-25 ENCOUNTER — Other Ambulatory Visit: Payer: Self-pay | Admitting: Internal Medicine

## 2015-07-25 DIAGNOSIS — R928 Other abnormal and inconclusive findings on diagnostic imaging of breast: Secondary | ICD-10-CM

## 2015-07-31 ENCOUNTER — Other Ambulatory Visit: Payer: Medicare Other

## 2015-08-01 ENCOUNTER — Other Ambulatory Visit: Payer: Self-pay | Admitting: Internal Medicine

## 2015-08-01 ENCOUNTER — Ambulatory Visit
Admission: RE | Admit: 2015-08-01 | Discharge: 2015-08-01 | Disposition: A | Discharge status: Home / Self Care | Payer: Medicare Other | Source: Ambulatory Visit | Attending: Internal Medicine | Admitting: Internal Medicine

## 2015-08-01 DIAGNOSIS — R928 Other abnormal and inconclusive findings on diagnostic imaging of breast: Secondary | ICD-10-CM

## 2015-08-02 ENCOUNTER — Telehealth: Payer: Self-pay

## 2015-08-02 NOTE — Telephone Encounter (Signed)
Discussed stopping Xarelto 48 hours prior to breast biopsy, (per Dr. Marlou Porch),  with the patient's sister, [name].   She was instructed to have Caitlin Hampton take her last Xarelto on Saturday 08-05-15 in preparation for her biopsy on 08-08-15 at [facility].  She verbalized an understanding of these instructions.

## 2015-08-07 ENCOUNTER — Other Ambulatory Visit: Payer: Self-pay | Admitting: Internal Medicine

## 2015-08-07 DIAGNOSIS — R928 Other abnormal and inconclusive findings on diagnostic imaging of breast: Secondary | ICD-10-CM

## 2015-08-08 ENCOUNTER — Inpatient Hospital Stay: Admission: RE | Admit: 2015-08-08 | Payer: Medicare Other | Source: Ambulatory Visit

## 2015-08-25 ENCOUNTER — Encounter: Payer: Self-pay | Admitting: Cardiology

## 2015-08-25 ENCOUNTER — Ambulatory Visit (INDEPENDENT_AMBULATORY_CARE_PROVIDER_SITE_OTHER): Payer: Medicare Other | Admitting: Cardiology

## 2015-08-25 VITALS — BP 130/78 | HR 49 | Ht 64.0 in | Wt 243.0 lb

## 2015-08-25 DIAGNOSIS — J438 Other emphysema: Secondary | ICD-10-CM | POA: Diagnosis not present

## 2015-08-25 DIAGNOSIS — Z7901 Long term (current) use of anticoagulants: Secondary | ICD-10-CM | POA: Diagnosis not present

## 2015-08-25 DIAGNOSIS — J441 Chronic obstructive pulmonary disease with (acute) exacerbation: Secondary | ICD-10-CM | POA: Diagnosis not present

## 2015-08-25 DIAGNOSIS — Z9229 Personal history of other drug therapy: Secondary | ICD-10-CM

## 2015-08-25 NOTE — Patient Instructions (Signed)

## 2015-08-25 NOTE — Progress Notes (Signed)
[facility] [place]. [address], [address], [place]  [address] Phone: [phone] Fax:  [phone]  Date:  08/25/2015   ID:  Caitlin Hampton, Caitlin Hampton 12-15-1945, MRN [id number]  PCP:  [name], MD   History of Present Illness: Caitlin Hampton is a 69 y.o. female COPD on home O2, atrial flutter currently on anticoagulation with Xarelto.  She was hospitalized on 12/13 for rapid ventricular response atrial fibrillation. And in nursing home in April 2016 for cellulitis of right lower extremity. Home health had been seeing her.  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 Shortness of breath with activity. She has had some weight gain but she admits to dietary indiscretion.  Wears oxygen at baseline.  11/11/14-cardioversion on 08/17/14 was successful. She has fallen on 10/27/14, extremity laceration.   Quite stable. Doing well. BiDil was very expensive. It was stopped on 02/06/15.d  08/25/15 - she is having occasional nosebleeds. Does not humidify O2. Still with SOB. No CP. Dr. Lysle Rubens checking blood.     Wt Readings from Last 3 Encounters:  08/25/15 243 lb (110.224 kg)  02/06/15 232 lb 6.4 oz (105.416 kg)  01/17/15 229 lb (103.874 kg)     Past Medical History  Diagnosis Date  . Hypertension   . Thyroid disease   . COPD (chronic obstructive pulmonary disease) ([place])   . Anxiety   . H/O hiatal hernia   . Hypothyroidism   . Shortness of breath   . Atrial flutter ([place]) 09/07/2012    , RVR/ hospital 12/13- rate control and Echo normal- on xarelto, when spontaneously converted she had bradycardia on Metoprolol and Digoxin  . GERD (gastroesophageal reflux disease)   . Rhinitis   . Parotitis     , on CT of face- 12/13  . CHF (congestive heart failure) ([place])   . Hx of echocardiogram     Echo (8/15):  EF 60-65%  . NSTEMI (non-ST elevated myocardial infarction) [facility])     Past Surgical History  Procedure Laterality Date  . Tonsillectomy    . Breast surgery      benign R brest CA  . Cardioversion N/A 04/02/2014    Procedure: CARDIOVERSION;  Surgeon: Josue Hector, MD;  Location: [place];  Service: Cardiovascular;  Laterality: N/A;  . Cardioversion N/A 08/17/2014    Procedure: CARDIOVERSION;  Surgeon: [name], MD;  Location: [facility] END[facility]COPY;  Service: Cardiovascular;  Laterality: N/A;    Current Outpatient Prescriptions  Medication Sig Dispense Refill  . acetaminophen (TYLENOL) 500 MG tablet Take 1,000 mg by mouth every 6 (six) hours as needed for mild pain.    Marland Kitchen albuterol (PROVENTIL) (2.5 MG/3ML) 0.083% nebulizer solution Take 2.5 mg by nebulization every 6 (six) hours as needed for wheezing or shortness of breath.    Marland Kitchen atorvastatin (LIPITOR) 10 MG tablet Take 10 mg by mouth daily.     . budesonide (PULMICORT) 0.25 MG/2ML nebulizer solution Take 2 mLs (0.25 mg total) by nebulization 2 (two) times daily. 60 mL 12  . diltiazem (CARDIZEM CD) 120 MG 24 hr capsule Take 120 mg by mouth 2 (two) times daily.  0  . furosemide (LASIX) 40 MG tablet Take 40 mg by mouth 2 (two) times daily.    . iron polysaccharides (NIFEREX) 150 MG capsule Take 1 capsule (150 mg total) by mouth 2 (two) times daily.    Marland Kitchen levothyroxine (SYNTHROID, LEVOTHROID) 100 MCG tablet Take 100 mcg by mouth daily.    Marland Kitchen  lisinopril (PRINIVIL,ZESTRIL) 40 MG tablet Take 40 mg by mouth daily.  8  . potassium chloride SA (K-DUR,KLOR-CON) 20 MEQ tablet TAKE ONE TABLET EACH DAY 30 tablet 6  . Rivaroxaban (XARELTO) 20 MG TABS Take 1 tablet (20 mg total) by mouth daily with breakfast. 30 tablet 11  . albuterol (PROVENTIL HFA;VENTOLIN HFA) 108 (90 BASE) MCG/ACT inhaler Inhale 2 puffs into the lungs every 6 (six) hours as needed for wheezing or shortness of breath.    . tiotropium (SPIRIVA) 18 MCG inhalation capsule Place 18 mcg into inhaler and inhale daily.     No current facility-administered medications for this visit.    Allergies:   No Known Allergies  Social History:  The patient  reports that  she quit smoking about 3 years ago. Her smoking use included Cigarettes. She smoked 1.50 packs per day. She does not have any smokeless tobacco history on file. She reports that she does not drink alcohol or use illicit drugs.   ROS:  Please see the history of present illness.   Denies any new breathing issues, syncope, orthopnea, PND    PHYSICAL EXAM: VS:  BP 130/78 mmHg  Pulse 49  Ht 5\' 4"  (1.626 m)  Wt 243 lb (110.224 kg)  BMI 41.69 kg/m2  SpO2 88% Well nourished, well developed, in no acute distressWheelchair HEENT: normalHome oxygen. Neck: no JVD Cardiac:  Reg, tachycardic; no murmur Lungs:  clear to auscultation bilaterally, no wheezing, rhonchi or rales Abd: soft, nontender, no hepatomegaly obesity Ext: 1 plus bilateral lower extremity edema. Skin: warm and dry Neuro: no focal abnormalities noted  EKG:  08/09/14-atrial flutter with 2-1 conduction heart rate 114 bpm     11/11/14-poor quality EKG, heart rate 68 bpm, possibly sinus rhythm.  ASSESSMENT AND PLAN:  1. Atrial flutter- has been paroxysmal. Amiodarone has been stopped since last hospitalization. She was having some significant bradycardia during that time. We will stay away fro m amiodarone. Severe lung disease. Obesity., It will be challenging to maintain rhythm control. She is quite comfortable currently in rate control. Overall doing well.  Continue with diltiazem anticoagulation. 2. Obesity-continue to encourage weight loss. 3. COPD/hypoxia-home oxygen, likely main driver of her dyspnea. 4. Anemia - Hg 9-10 improved from 8.  5. Nosebleeds. - humidify. Saline.  6. Diastolic heart failure-currently fairly well compensated.  7. 6 month follow up.  Signed, [name], MD [facility]  08/25/2015 3:43 PM

## 2015-09-11 ENCOUNTER — Encounter (HOSPITAL_COMMUNITY): Payer: Self-pay | Admitting: Emergency Medicine

## 2015-09-11 ENCOUNTER — Emergency Department (HOSPITAL_COMMUNITY): Payer: Medicare Other

## 2015-09-11 ENCOUNTER — Inpatient Hospital Stay (HOSPITAL_COMMUNITY)
Admission: EM | Admit: 2015-09-11 | Discharge: 2015-10-03 | DRG: 853 | Disposition: A | Discharge status: Skilled Nursing Facility | Payer: Medicare Other | Attending: Internal Medicine | Admitting: Internal Medicine

## 2015-09-11 DIAGNOSIS — K5900 Constipation, unspecified: Secondary | ICD-10-CM | POA: Diagnosis not present

## 2015-09-11 DIAGNOSIS — R739 Hyperglycemia, unspecified: Secondary | ICD-10-CM | POA: Diagnosis present

## 2015-09-11 DIAGNOSIS — Z87891 Personal history of nicotine dependence: Secondary | ICD-10-CM

## 2015-09-11 DIAGNOSIS — I272 Other secondary pulmonary hypertension: Secondary | ICD-10-CM | POA: Diagnosis present

## 2015-09-11 DIAGNOSIS — N179 Acute kidney failure, unspecified: Secondary | ICD-10-CM | POA: Diagnosis not present

## 2015-09-11 DIAGNOSIS — E039 Hypothyroidism, unspecified: Secondary | ICD-10-CM | POA: Diagnosis present

## 2015-09-11 DIAGNOSIS — Z79899 Other long term (current) drug therapy: Secondary | ICD-10-CM | POA: Diagnosis not present

## 2015-09-11 DIAGNOSIS — I482 Chronic atrial fibrillation, unspecified: Secondary | ICD-10-CM | POA: Diagnosis present

## 2015-09-11 DIAGNOSIS — L89610 Pressure ulcer of right heel, unstageable: Secondary | ICD-10-CM | POA: Diagnosis present

## 2015-09-11 DIAGNOSIS — Z6841 Body Mass Index (BMI) 40.0 and over, adult: Secondary | ICD-10-CM | POA: Diagnosis not present

## 2015-09-11 DIAGNOSIS — J9622 Acute and chronic respiratory failure with hypercapnia: Secondary | ICD-10-CM | POA: Diagnosis not present

## 2015-09-11 DIAGNOSIS — G9340 Encephalopathy, unspecified: Secondary | ICD-10-CM | POA: Diagnosis not present

## 2015-09-11 DIAGNOSIS — R5381 Other malaise: Secondary | ICD-10-CM | POA: Diagnosis not present

## 2015-09-11 DIAGNOSIS — T380X5A Adverse effect of glucocorticoids and synthetic analogues, initial encounter: Secondary | ICD-10-CM | POA: Diagnosis present

## 2015-09-11 DIAGNOSIS — I252 Old myocardial infarction: Secondary | ICD-10-CM

## 2015-09-11 DIAGNOSIS — E872 Acidosis: Secondary | ICD-10-CM | POA: Diagnosis present

## 2015-09-11 DIAGNOSIS — D62 Acute posthemorrhagic anemia: Secondary | ICD-10-CM | POA: Diagnosis present

## 2015-09-11 DIAGNOSIS — J9600 Acute respiratory failure, unspecified whether with hypoxia or hypercapnia: Secondary | ICD-10-CM

## 2015-09-11 DIAGNOSIS — I130 Hypertensive heart and chronic kidney disease with heart failure and stage 1 through stage 4 chronic kidney disease, or unspecified chronic kidney disease: Secondary | ICD-10-CM | POA: Diagnosis present

## 2015-09-11 DIAGNOSIS — Z9981 Dependence on supplemental oxygen: Secondary | ICD-10-CM

## 2015-09-11 DIAGNOSIS — I5033 Acute on chronic diastolic (congestive) heart failure: Secondary | ICD-10-CM | POA: Diagnosis present

## 2015-09-11 DIAGNOSIS — I2510 Atherosclerotic heart disease of native coronary artery without angina pectoris: Secondary | ICD-10-CM | POA: Diagnosis present

## 2015-09-11 DIAGNOSIS — D631 Anemia in chronic kidney disease: Secondary | ICD-10-CM | POA: Diagnosis present

## 2015-09-11 DIAGNOSIS — J441 Chronic obstructive pulmonary disease with (acute) exacerbation: Secondary | ICD-10-CM | POA: Diagnosis present

## 2015-09-11 DIAGNOSIS — D72829 Elevated white blood cell count, unspecified: Secondary | ICD-10-CM | POA: Diagnosis present

## 2015-09-11 DIAGNOSIS — R0602 Shortness of breath: Secondary | ICD-10-CM | POA: Diagnosis present

## 2015-09-11 DIAGNOSIS — E860 Dehydration: Secondary | ICD-10-CM | POA: Diagnosis present

## 2015-09-11 DIAGNOSIS — Z7951 Long term (current) use of inhaled steroids: Secondary | ICD-10-CM

## 2015-09-11 DIAGNOSIS — I4892 Unspecified atrial flutter: Secondary | ICD-10-CM | POA: Diagnosis present

## 2015-09-11 DIAGNOSIS — R319 Hematuria, unspecified: Secondary | ICD-10-CM | POA: Diagnosis not present

## 2015-09-11 DIAGNOSIS — J9690 Respiratory failure, unspecified, unspecified whether with hypoxia or hypercapnia: Secondary | ICD-10-CM

## 2015-09-11 DIAGNOSIS — K567 Ileus, unspecified: Secondary | ICD-10-CM | POA: Diagnosis not present

## 2015-09-11 DIAGNOSIS — S8011XA Contusion of right lower leg, initial encounter: Secondary | ICD-10-CM | POA: Diagnosis present

## 2015-09-11 DIAGNOSIS — E875 Hyperkalemia: Secondary | ICD-10-CM | POA: Diagnosis not present

## 2015-09-11 DIAGNOSIS — J9621 Acute and chronic respiratory failure with hypoxia: Secondary | ICD-10-CM | POA: Diagnosis present

## 2015-09-11 DIAGNOSIS — R0902 Hypoxemia: Secondary | ICD-10-CM

## 2015-09-11 DIAGNOSIS — J440 Chronic obstructive pulmonary disease with acute lower respiratory infection: Secondary | ICD-10-CM | POA: Diagnosis present

## 2015-09-11 DIAGNOSIS — Z7901 Long term (current) use of anticoagulants: Secondary | ICD-10-CM | POA: Diagnosis not present

## 2015-09-11 DIAGNOSIS — Z9229 Personal history of other drug therapy: Secondary | ICD-10-CM

## 2015-09-11 DIAGNOSIS — J189 Pneumonia, unspecified organism: Secondary | ICD-10-CM | POA: Diagnosis present

## 2015-09-11 DIAGNOSIS — R140 Abdominal distension (gaseous): Secondary | ICD-10-CM

## 2015-09-11 DIAGNOSIS — W19XXXA Unspecified fall, initial encounter: Secondary | ICD-10-CM | POA: Diagnosis present

## 2015-09-11 DIAGNOSIS — N390 Urinary tract infection, site not specified: Secondary | ICD-10-CM | POA: Diagnosis present

## 2015-09-11 DIAGNOSIS — A419 Sepsis, unspecified organism: Secondary | ICD-10-CM | POA: Diagnosis present

## 2015-09-11 DIAGNOSIS — N182 Chronic kidney disease, stage 2 (mild): Secondary | ICD-10-CM | POA: Diagnosis present

## 2015-09-11 DIAGNOSIS — R0600 Dyspnea, unspecified: Secondary | ICD-10-CM

## 2015-09-11 DIAGNOSIS — I10 Essential (primary) hypertension: Secondary | ICD-10-CM | POA: Diagnosis not present

## 2015-09-11 DIAGNOSIS — L8990 Pressure ulcer of unspecified site, unspecified stage: Secondary | ICD-10-CM | POA: Insufficient documentation

## 2015-09-11 HISTORY — DX: Pneumonia, unspecified organism: J18.9

## 2015-09-11 LAB — CBC WITH DIFFERENTIAL/PLATELET
BASOS ABS: 0 10*3/uL (ref 0.0–0.1)
BASOS PCT: 0 %
Basophils Absolute: 0 10*3/uL (ref 0.0–0.1)
Basophils Relative: 0 %
Eosinophils Absolute: 0 10*3/uL (ref 0.0–0.7)
Eosinophils Absolute: 0 10*3/uL (ref 0.0–0.7)
Eosinophils Relative: 0 %
Eosinophils Relative: 0 %
HEMATOCRIT: 32 % — AB (ref 36.0–46.0)
HEMATOCRIT: 30.8 % — AB (ref 36.0–46.0)
HEMOGLOBIN: 8.9 g/dL — AB (ref 12.0–15.0)
Hemoglobin: 8.4 g/dL — ABNORMAL LOW (ref 12.0–15.0)
LYMPHS PCT: 6 %
LYMPHS PCT: 3 %
Lymphs Abs: 0.7 10*3/uL (ref 0.7–4.0)
Lymphs Abs: 0.3 10*3/uL — ABNORMAL LOW (ref 0.7–4.0)
MCH: 22.5 pg — ABNORMAL LOW (ref 26.0–34.0)
MCH: 22 pg — ABNORMAL LOW (ref 26.0–34.0)
MCHC: 27.8 g/dL — AB (ref 30.0–36.0)
MCHC: 27.3 g/dL — AB (ref 30.0–36.0)
MCV: 80.8 fL (ref 78.0–100.0)
MCV: 80.6 fL (ref 78.0–100.0)
MONO ABS: 0.1 10*3/uL (ref 0.1–1.0)
MONOS PCT: 7 %
MONOS PCT: 1 %
Monocytes Absolute: 0.8 10*3/uL (ref 0.1–1.0)
NEUTROS ABS: 9.9 10*3/uL — AB (ref 1.7–7.7)
NEUTROS ABS: 10.1 10*3/uL — AB (ref 1.7–7.7)
NEUTROS PCT: 87 %
Neutrophils Relative %: 96 %
Platelets: 335 10*3/uL (ref 150–400)
Platelets: 272 10*3/uL (ref 150–400)
RBC: 3.96 MIL/uL (ref 3.87–5.11)
RBC: 3.82 MIL/uL — ABNORMAL LOW (ref 3.87–5.11)
RDW: 19.6 % — ABNORMAL HIGH (ref 11.5–15.5)
RDW: 19.6 % — AB (ref 11.5–15.5)
WBC: 11.5 10*3/uL — ABNORMAL HIGH (ref 4.0–10.5)
WBC: 10.5 10*3/uL (ref 4.0–10.5)

## 2015-09-11 LAB — URINE MICROSCOPIC-ADD ON

## 2015-09-11 LAB — URINALYSIS, ROUTINE W REFLEX MICROSCOPIC
BILIRUBIN URINE: NEGATIVE
GLUCOSE, UA: NEGATIVE mg/dL
KETONES UR: 15 mg/dL — AB
Nitrite: POSITIVE — AB
PH: 5 (ref 5.0–8.0)
Protein, ur: 30 mg/dL — AB
Specific Gravity, Urine: 1.021 (ref 1.005–1.030)

## 2015-09-11 LAB — I-STAT CG4 LACTIC ACID, ED
LACTIC ACID, VENOUS: 2.05 mmol/L — AB (ref 0.5–2.0)
LACTIC ACID, VENOUS: 2.55 mmol/L — AB (ref 0.5–2.0)
Lactic Acid, Venous: 2.29 mmol/L (ref 0.5–2.0)

## 2015-09-11 LAB — BASIC METABOLIC PANEL
ANION GAP: 10 (ref 5–15)
BUN: 12 mg/dL (ref 6–20)
CHLORIDE: 105 mmol/L (ref 101–111)
CO2: 26 mmol/L (ref 22–32)
Calcium: 8.6 mg/dL — ABNORMAL LOW (ref 8.9–10.3)
Creatinine, Ser: 0.94 mg/dL (ref 0.44–1.00)
GFR calc non Af Amer: >60 mL/min (ref >60)
Glucose, Bld: 108 mg/dL — ABNORMAL HIGH (ref 65–99)
Potassium: 3.4 mmol/L — ABNORMAL LOW (ref 3.5–5.1)
Sodium: 141 mmol/L (ref 135–145)

## 2015-09-11 LAB — PROCALCITONIN: Procalcitonin: <0.1 ng/mL

## 2015-09-11 LAB — I-STAT ARTERIAL BLOOD GAS, ED
ACID-BASE EXCESS: 1 mmol/L (ref 0.0–2.0)
Bicarbonate: 26 mEq/L — ABNORMAL HIGH (ref 20.0–24.0)
O2 Saturation: 94 %
PH ART: 7.394 (ref 7.350–7.450)
TCO2: 27 mmol/L (ref 0–100)
pCO2 arterial: 42.7 mmHg (ref 35.0–45.0)
pO2, Arterial: 73 mmHg — ABNORMAL LOW (ref 80.0–100.0)

## 2015-09-11 LAB — PROTIME-INR
INR: 2.11 — AB (ref 0.00–1.49)
Prothrombin Time: 23.5 seconds — ABNORMAL HIGH (ref 11.6–15.2)

## 2015-09-11 LAB — TROPONIN I: TROPONIN I: 0.04 ng/mL — AB (ref <0.031)

## 2015-09-11 LAB — MRSA PCR SCREENING: MRSA by PCR: NEGATIVE

## 2015-09-11 LAB — BRAIN NATRIURETIC PEPTIDE: B NATRIURETIC PEPTIDE 5: 737.3 pg/mL — AB (ref 0.0–100.0)

## 2015-09-11 LAB — APTT: aPTT: 27 seconds (ref 24–37)

## 2015-09-11 MED ORDER — IPRATROPIUM-ALBUTEROL 0.5-2.5 (3) MG/3ML IN SOLN
3.0000 mL | Freq: Once | RESPIRATORY_TRACT | Status: AC
  Administered 2015-09-11: 3 mL via RESPIRATORY_TRACT
  Filled 2015-09-11: qty 3

## 2015-09-11 MED ORDER — CHLORHEXIDINE GLUCONATE 0.12 % MT SOLN
15.0000 mL | Freq: Two times a day (BID) | OROMUCOSAL | Status: DC
  Administered 2015-09-11 – 2015-09-15 (×8): 15 mL via OROMUCOSAL
  Filled 2015-09-11 (×6): qty 15

## 2015-09-11 MED ORDER — SODIUM CHLORIDE 0.9 % IV BOLUS (SEPSIS)
1000.0000 mL | INTRAVENOUS | Status: AC
  Administered 2015-09-11 (×3): 1000 mL via INTRAVENOUS

## 2015-09-11 MED ORDER — DEXTROSE 5 % IV SOLN: 1.0000 g | Freq: Once | INTRAVENOUS | Status: DC

## 2015-09-11 MED ORDER — DEXTROSE 5 % IV SOLN
500.0000 mg | INTRAVENOUS | Status: DC
  Administered 2015-09-11 – 2015-09-12 (×2): 500 mg via INTRAVENOUS
  Filled 2015-09-11 (×4): qty 500

## 2015-09-11 MED ORDER — DEXTROSE 5 % IV SOLN: 500.0000 mg | Freq: Once | INTRAVENOUS | Status: DC

## 2015-09-11 MED ORDER — SODIUM CHLORIDE 0.9 % IJ SOLN
3.0000 mL | Freq: Two times a day (BID) | INTRAMUSCULAR | Status: DC
  Administered 2015-09-11 – 2015-10-03 (×39): 3 mL via INTRAVENOUS

## 2015-09-11 MED ORDER — SODIUM CHLORIDE 0.9 % IV BOLUS (SEPSIS)
500.0000 mL | INTRAVENOUS | Status: AC
  Administered 2015-09-11: 500 mL via INTRAVENOUS

## 2015-09-11 MED ORDER — BUDESONIDE 0.25 MG/2ML IN SUSP
0.2500 mg | Freq: Two times a day (BID) | RESPIRATORY_TRACT | Status: DC
  Administered 2015-09-11 – 2015-09-25 (×27): 0.25 mg via RESPIRATORY_TRACT
  Filled 2015-09-11 (×27): qty 2

## 2015-09-11 MED ORDER — DEXTROSE 5 % IV SOLN
2.0000 g | INTRAVENOUS | Status: DC
  Administered 2015-09-11 – 2015-09-12 (×2): 2 g via INTRAVENOUS
  Filled 2015-09-11 (×3): qty 2

## 2015-09-11 MED ORDER — ACETAMINOPHEN 650 MG RE SUPP: 650.0000 mg | Freq: Four times a day (QID) | RECTAL | Status: DC | PRN

## 2015-09-11 MED ORDER — ACETAMINOPHEN 325 MG PO TABS: 650.0000 mg | ORAL_TABLET | Freq: Four times a day (QID) | ORAL | Status: DC | PRN

## 2015-09-11 MED ORDER — DILTIAZEM HCL ER COATED BEADS 120 MG PO CP24
120.0000 mg | ORAL_CAPSULE | Freq: Two times a day (BID) | ORAL | Status: DC
  Administered 2015-09-12 – 2015-09-25 (×27): 120 mg via ORAL
  Filled 2015-09-11 (×32): qty 1

## 2015-09-11 MED ORDER — METHYLPREDNISOLONE SODIUM SUCC 40 MG IJ SOLR
40.0000 mg | Freq: Two times a day (BID) | INTRAMUSCULAR | Status: DC
  Administered 2015-09-11 – 2015-09-13 (×4): 40 mg via INTRAVENOUS
  Filled 2015-09-11 (×4): qty 1

## 2015-09-11 MED ORDER — ALBUTEROL (5 MG/ML) CONTINUOUS INHALATION SOLN
10.0000 mg/h | INHALATION_SOLUTION | Freq: Once | RESPIRATORY_TRACT | Status: AC
  Administered 2015-09-11: 10 mg/h via RESPIRATORY_TRACT
  Filled 2015-09-11: qty 20

## 2015-09-11 MED ORDER — IPRATROPIUM BROMIDE 0.02 % IN SOLN
0.5000 mg | Freq: Once | RESPIRATORY_TRACT | Status: DC
  Filled 2015-09-11: qty 2.5

## 2015-09-11 MED ORDER — LEVOTHYROXINE SODIUM 100 MCG PO TABS
100.0000 ug | ORAL_TABLET | Freq: Every day | ORAL | Status: DC
  Administered 2015-09-12 – 2015-09-24 (×13): 100 ug via ORAL
  Filled 2015-09-11 (×13): qty 1

## 2015-09-11 MED ORDER — SODIUM CHLORIDE 0.9 % IV SOLN
INTRAVENOUS | Status: DC
  Administered 2015-09-11: 20:00:00 via INTRAVENOUS

## 2015-09-11 MED ORDER — LEVALBUTEROL HCL 0.63 MG/3ML IN NEBU
0.6300 mg | INHALATION_SOLUTION | Freq: Four times a day (QID) | RESPIRATORY_TRACT | Status: DC
  Administered 2015-09-11 – 2015-09-15 (×15): 0.63 mg via RESPIRATORY_TRACT
  Filled 2015-09-11 (×15): qty 3

## 2015-09-11 MED ORDER — ATORVASTATIN CALCIUM 10 MG PO TABS
10.0000 mg | ORAL_TABLET | Freq: Every day | ORAL | Status: DC
  Administered 2015-09-11 – 2015-09-25 (×15): 10 mg via ORAL
  Filled 2015-09-11 (×14): qty 1

## 2015-09-11 MED ORDER — ONDANSETRON HCL 4 MG/2ML IJ SOLN
4.0000 mg | Freq: Four times a day (QID) | INTRAMUSCULAR | Status: DC | PRN
  Administered 2015-09-28 – 2015-09-30 (×2): 4 mg via INTRAVENOUS
  Filled 2015-09-11 (×2): qty 2

## 2015-09-11 MED ORDER — ALBUTEROL SULFATE (2.5 MG/3ML) 0.083% IN NEBU: 2.5000 mg | INHALATION_SOLUTION | Freq: Four times a day (QID) | RESPIRATORY_TRACT | Status: DC | PRN

## 2015-09-11 MED ORDER — ONDANSETRON HCL 4 MG PO TABS: 4.0000 mg | ORAL_TABLET | Freq: Four times a day (QID) | ORAL | Status: DC | PRN

## 2015-09-11 MED ORDER — METHYLPREDNISOLONE SODIUM SUCC 125 MG IJ SOLR
125.0000 mg | Freq: Once | INTRAMUSCULAR | Status: AC
  Administered 2015-09-11: 125 mg via INTRAVENOUS
  Filled 2015-09-11: qty 2

## 2015-09-11 MED ORDER — RIVAROXABAN 20 MG PO TABS
20.0000 mg | ORAL_TABLET | Freq: Every day | ORAL | Status: DC
  Administered 2015-09-12 – 2015-09-14 (×3): 20 mg via ORAL
  Filled 2015-09-11 (×3): qty 1

## 2015-09-11 MED ORDER — CETYLPYRIDINIUM CHLORIDE 0.05 % MT LIQD
7.0000 mL | Freq: Two times a day (BID) | OROMUCOSAL | Status: DC
  Administered 2015-09-12 – 2015-09-15 (×6): 7 mL via OROMUCOSAL

## 2015-09-11 MED ORDER — IPRATROPIUM-ALBUTEROL 0.5-2.5 (3) MG/3ML IN SOLN
3.0000 mL | Freq: Once | RESPIRATORY_TRACT | Status: DC
  Filled 2015-09-11: qty 3

## 2015-09-11 MED ORDER — MAGNESIUM SULFATE 2 GM/50ML IV SOLN
2.0000 g | Freq: Once | INTRAVENOUS | Status: AC
  Administered 2015-09-11: 2 g via INTRAVENOUS
  Filled 2015-09-11: qty 50

## 2015-09-11 NOTE — ED Notes (Signed)
Pt to ER via [facility] with complaint of fall, pt was getting into car on the passenger side and fell, did not hit her head or lose consciousness. Pt has hematoma to right leg from fall. Pt was going to the PCP this afternoon for progressive worsening cough with blood. Per EMS pt lung fields have wheezing in upper and lower lobes. Pt has hx of a fib, COPD, and CHF. Pt is a/o x4.

## 2015-09-11 NOTE — ED Notes (Signed)
Attempted to call report

## 2015-09-11 NOTE — H&P (Signed)
History and Physical  [name] [id number] DOB: [birth year] DOA: 09/11/2015  Referring physician: Harlow Hampton, ER Physician  PCP: Caitlin Low, MD   Chief Complaint: Shortness of breath, cough and fall   HPI: Caitlin Hampton is a 69 y.o. female  With past medical history of COPD on chronic oxygen, atrial fibrillation on chronic anticoagulation and CAD who for the last week started having progressively increasing dyspnea on exertion and a mostly nonproductive cough who decided to go see her PCP today after she started coughing up small amounts of blood. When she tried to get to her sister's car, she fell hitting her left leg which led to a very large bruise and swelling. Patient unable to stand so she came into the emergency room. In the emergency room, no evidence of fracture. Hampton extremity found to have a very large hematoma. Patient found to be in acute respiratory distress requiring additional supplemental oxygen lab work noted a mild leukocytosis and a lactic acid level of 2. Patient given breathing treatments and an ABG done noted no signs of mild hypoxia. Chest x-ray was unrevealing, but then a repeat lactic acid level had increased to 2.55. Case discussed with hospitalist and code sepsis initiated. Blood cultures drawn and patient started on aggressive IV volume resuscitation plus antibiotics.   Review of Systems:  Patient seen in the emergency room . Pt complains of shortness of breath although with additional oxygen, better. She also complains of some wheezing, cough although this is used off some and some leg pain at site of hematoma .  Pt denies any headaches, vision changes, dysphagia, chest pain, palpitations, abdominal pain, hematuria, dysuria, constipation, diarrhea, focal extremity numbness weakness or pain other than Hampton as described above .  Review of systems are otherwise negative  Past Medical History  Diagnosis Date  . Hypertension   . Thyroid disease   . COPD  (chronic obstructive pulmonary disease) ([place])   . Anxiety   . H/O hiatal hernia   . Hypothyroidism   . Shortness of breath   . Atrial flutter ([place]) 09/07/2012    , RVR/ hospital 12/13- rate control and Echo normal- on xarelto, when spontaneously converted she had bradycardia on Metoprolol and Digoxin  . GERD (gastroesophageal reflux disease)   . Rhinitis   . Parotitis     , on CT of face- 12/13  . CHF (congestive heart failure) ([place])   . Hx of echocardiogram     Echo (8/15):  EF 60-65%  . NSTEMI (non-ST elevated myocardial infarction) [facility])    Past Surgical History  Procedure Laterality Date  . Tonsillectomy    . Breast surgery      benign R brest CA  . Cardioversion N/A 04/02/2014    Procedure: CARDIOVERSION;  Surgeon: Josue Hector, MD;  Location: [place];  Service: Cardiovascular;  Laterality: N/A;  . Cardioversion N/A 08/17/2014    Procedure: CARDIOVERSION;  Surgeon: [name], MD;  Location: [facility] ENDOSCOPY;  Service: Cardiovascular;  Laterality: N/A;   Social History:  reports that she quit smoking about 3 years ago. Her smoking use included Cigarettes. She smoked 1.50 packs per day. She does not have any smokeless tobacco history on file. She reports that she does not drink alcohol or use illicit drugs. Patient lives at home with her sister  & is able to participate in activities of daily living using a walker  No Known Allergies  Family History  Problem Relation Age of Onset  . CAD Mother   .  Stroke Mother   . Prostate cancer Father   . Dementia Father       Prior to Admission medications   Medication Sig Start Date End Date Taking? Authorizing Provider  acetaminophen (TYLENOL) 500 MG tablet Take 1,000 mg by mouth every 6 (six) hours as needed for mild pain.   Yes Historical Provider, MD  albuterol (PROVENTIL HFA;VENTOLIN HFA) 108 (90 BASE) MCG/ACT inhaler Inhale 2 puffs into the lungs every 6 (six) hours as needed for wheezing or shortness of breath.   Yes Historical  Provider, MD  albuterol (PROVENTIL) (2.5 MG/3ML) 0.083% nebulizer solution Take 2.5 mg by nebulization every 6 (six) hours as needed for wheezing or shortness of breath.   Yes Historical Provider, MD  atorvastatin (LIPITOR) 10 MG tablet Take 10 mg by mouth daily.  05/03/14  Yes Historical Provider, MD  budesonide (PULMICORT) 0.25 MG/2ML nebulizer solution Take 2 mLs (0.25 mg total) by nebulization 2 (two) times daily. 12/16/14  Yes Barton Dubois, MD  diltiazem (CARDIZEM CD) 120 MG 24 hr capsule Take 120 mg by mouth 2 (two) times daily. 12/01/14  Yes Historical Provider, MD  furosemide (LASIX) 40 MG tablet Take 40 mg by mouth 2 (two) times daily.   Yes Historical Provider, MD  iron polysaccharides (NIFEREX) 150 MG capsule Take 1 capsule (150 mg total) by mouth 2 (two) times daily. 12/16/14  Yes Barton Dubois, MD  levothyroxine (SYNTHROID, LEVOTHROID) 100 MCG tablet Take 100 mcg by mouth daily.   Yes Historical Provider, MD  potassium chloride SA (K-DUR,KLOR-CON) 20 MEQ tablet TAKE ONE TABLET EACH DAY Patient taking differently: take one half tablet each day with furosemide 06/29/15  Yes Jerline Pain, MD  Rivaroxaban (XARELTO) 20 MG TABS Take 1 tablet (20 mg total) by mouth daily with breakfast. 09/12/12  Yes Lavone Orn, MD  tiotropium (SPIRIVA) 18 MCG inhalation capsule Place 18 mcg into inhaler and inhale daily.    Historical Provider, MD    Physical Exam: BP 137/68 mmHg  Pulse 79  Temp(Src) 97.9 F (36.6 C)  Resp 23  SpO2 94%  General:  Alert and oriented 3, mild distress from labored breathing  Eyes: Sclera non-icteric, extra ocular muscles intact ENT: normo-cephalic, atraumatic, mucous membranes are slightly dry  Neck: thick neck, no visible JVD  Cardiovascular: Irregular rhythm, rate controlled  Respiratory: Decreased breath sounds throughout, prolonged expiratory phase bilaterally  Abdomen: Soft, obese, nontender, hypoactive bowel sounds  Skin: No skin breaks or tears, multiple skin  tags.  Musculoskeletal: 1+ pitting edema bilaterally. Right lower extremity nodes of very large hematoma measuring approximately 3 inches in diameter, tender  Psychiatric: Patient is appropriate, no evidence of psychoses  Neurologic: No focal deficits           Labs on Admission:  Basic Metabolic Panel:  Recent Labs Lab 09/11/15 1527  NA 141  K 3.4*  CL 105  CO2 26  GLUCOSE 108*  BUN 12  CREATININE 0.94  CALCIUM 8.6*   Liver Function Tests: No results for input(s): AST, ALT, ALKPHOS, BILITOT, PROT, ALBUMIN in the last 168 hours. No results for input(s): LIPASE, AMYLASE in the last 168 hours. No results for input(s): AMMONIA in the last 168 hours. CBC:  Recent Labs Lab 09/11/15 1527  WBC 11.5*  NEUTROABS 9.9*  HGB 8.9*  HCT 32.0*  MCV 80.8  PLT 335   Cardiac Enzymes:  Recent Labs Lab 09/11/15 1527  TROPONINI 0.04*    BNP (last 3 results)  Recent Labs  12/11/14  1958 09/11/15 1527  BNP 1060.6* 737.3*    ProBNP (last 3 results) No results for input(s): PROBNP in the last 8760 hours.  CBG: No results for input(s): GLUCAP in the last 168 hours.  Radiological Exams on Admission: Dg Chest Portable 1 View  09/11/2015  CLINICAL DATA:  Shortness of breath 1 day. EXAM: PORTABLE CHEST 1 VIEW COMPARISON:  12/13/2014 and 12/11/2014 FINDINGS: Lordotic technique demonstrated. Subtle stable elevation of the left hemidiaphragm. Lungs are hypoinflated with minimal prominence of the central perihilar markings likely minimal vascular congestion. No definite effusion. No lobar consolidation. Stable moderate cardiomegaly. There is calcified plaque over the aortic arch. Remainder of the exam is unchanged. IMPRESSION: Stable cardiomegaly with findings suggesting mild vascular congestion. Electronically Signed   By: Marin Olp M.D.   On: 09/11/2015 15:26   Dg Tibia/fibula Right Port  09/11/2015  CLINICAL DATA:  Status post fall today with a right lower leg injury and pain.  Initial encounter. EXAM: PORTABLE RIGHT TIBIA AND FIBULA - 2 VIEW COMPARISON:  None. FINDINGS: No acute bony or joint abnormality is identified. Degenerative change about the right knee is noted. Scattered atherosclerotic calcifications are seen. IMPRESSION: No acute abnormality. Electronically Signed   By: Inge Rise M.D.   On: 09/11/2015 15:26    EKG: Independently reviewed. Read as sinus rhythm with a prolonged PR interval, however no P waves visible so this is more likely age fibrillation with a controlled rhythm and Hampton voltage  Assessment/Plan Present on Admission:  . COPD exacerbation ([place]) . Sepsis ([place]) secondary to community-acquired pneumonia plus COPD exacerbation causing acute on chronic respiratory failure with hypoxia: Patient meets criteria given rising lactic acidosis, tachypnea, mild leukocytosis. Treat with aggressive fluid resuscitation, IV antibiotics, nebulizers, additional oxygen   and steroids   . Chronic a-fib ([place]): Rate controlled. Given stable blood pressure, we'll restart Cardizem. Continue Xarelto. Patient's chads 2 score of 4  . Morbid obesity [facility]): Patient needs criteria with BMI greater than 40   Consultants: None  Code Status: Full code as confirmed by patient    Family Communication: Sister at the bedside    Disposition Plan: Place and stepped down, anticipate stay for at least a few days    Time spent: 45 minutes    [facility] Pager [phone]

## 2015-09-11 NOTE — ED Provider Notes (Signed)
CSN: [id number]     Arrival date & time 09/11/15  1440 History   First MD Initiated Contact with Patient 09/11/15 1454     Chief Complaint  Patient presents with  . Shortness of Breath  . Fall     (Consider location/radiation/quality/duration/timing/severity/associated sxs/prior Treatment) HPI Comments: Suffered fall today while trying to get in the car on way to see doctor. States stumbled and injured R leg on floor of car.  Did not hit head or LOC.  Has had progressively worsening SOB x several days with cough.  Yesterday had specks of blood in her mouth of uncertain source.  On xarelto normally, did not take today because scheduled for breast biopsy in 2 days.  Cough productive of yellow and green mucus.  No fever. No chest pain.  No head, neck, back or abdominal pain.  Pain and hematoma to RLE. On o2 3L at baseline.  Sats mid 18s on arrival.  Patient is a 69 y.o. female presenting with shortness of breath and fall. The history is provided by the patient and the EMS personnel.  Shortness of Breath Associated symptoms: cough   Associated symptoms: no abdominal pain, no chest pain, no fever, no headaches, no rash and no vomiting   Fall Associated symptoms include shortness of breath. Pertinent negatives include no chest pain, no abdominal pain and no headaches.    Past Medical History  Diagnosis Date  . Hypertension   . Thyroid disease   . COPD (chronic obstructive pulmonary disease) ([place])   . Anxiety   . H/O hiatal hernia   . Hypothyroidism   . Shortness of breath   . Atrial flutter ([place]) 09/07/2012    , RVR/ hospital 12/13- rate control and Echo normal- on xarelto, when spontaneously converted she had bradycardia on Metoprolol and Digoxin  . GERD (gastroesophageal reflux disease)   . Rhinitis   . Parotitis     , on CT of face- 12/13  . CHF (congestive heart failure) ([place])   . Hx of echocardiogram     Echo (8/15):  EF 60-65%  . NSTEMI (non-ST elevated myocardial infarction)  [facility])    Past Surgical History  Procedure Laterality Date  . Tonsillectomy    . Breast surgery      benign R brest CA  . Cardioversion N/A 04/02/2014    Procedure: CARDIOVERSION;  Surgeon: Josue Hector, MD;  Location: [place];  Service: Cardiovascular;  Laterality: N/A;  . Cardioversion N/A 08/17/2014    Procedure: CARDIOVERSION;  Surgeon: [name], MD;  Location: [facility] ENDOSCOPY;  Service: Cardiovascular;  Laterality: N/A;   Family History  Problem Relation Age of Onset  . CAD Mother   . Stroke Mother   . Prostate cancer Father   . Dementia Father    Social History  Substance Use Topics  . Smoking status: Former Smoker -- 1.50 packs/day    Types: Cigarettes    Quit date: 07/23/2012  . Smokeless tobacco: None  . Alcohol Use: No     Comment: occassionally   OB History    No data available     Review of Systems  Constitutional: Positive for activity change, appetite change and fatigue. Negative for fever.  HENT: Negative for congestion.   Eyes: Negative for visual disturbance.  Respiratory: Positive for cough and shortness of breath. Negative for chest tightness.   Cardiovascular: Positive for leg swelling. Negative for chest pain.  Gastrointestinal: Negative for nausea, vomiting and abdominal pain.  Genitourinary: Negative for dysuria,  hematuria, vaginal bleeding and vaginal discharge.  Musculoskeletal: Negative for myalgias and arthralgias.  Skin: Positive for wound. Negative for rash.  Neurological: Negative for dizziness, light-headedness and headaches.  A complete 10 system review of systems was obtained and all systems are negative except as noted in the HPI and PMH.      Allergies  Review of patient's allergies indicates no known allergies.  Home Medications   Prior to Admission medications   Medication Sig Start Date End Date Taking? Authorizing Provider  acetaminophen (TYLENOL) 500 MG tablet Take 1,000 mg by mouth every 6 (six) hours as needed for mild  pain.   Yes Historical Provider, MD  albuterol (PROVENTIL HFA;VENTOLIN HFA) 108 (90 BASE) MCG/ACT inhaler Inhale 2 puffs into the lungs every 6 (six) hours as needed for wheezing or shortness of breath.   Yes Historical Provider, MD  albuterol (PROVENTIL) (2.5 MG/3ML) 0.083% nebulizer solution Take 2.5 mg by nebulization every 6 (six) hours as needed for wheezing or shortness of breath.   Yes Historical Provider, MD  atorvastatin (LIPITOR) 10 MG tablet Take 10 mg by mouth daily.  05/03/14  Yes Historical Provider, MD  budesonide (PULMICORT) 0.25 MG/2ML nebulizer solution Take 2 mLs (0.25 mg total) by nebulization 2 (two) times daily. 12/16/14  Yes Barton Dubois, MD  diltiazem (CARDIZEM CD) 120 MG 24 hr capsule Take 120 mg by mouth 2 (two) times daily. 12/01/14  Yes Historical Provider, MD  furosemide (LASIX) 40 MG tablet Take 40 mg by mouth 2 (two) times daily.   Yes Historical Provider, MD  iron polysaccharides (NIFEREX) 150 MG capsule Take 1 capsule (150 mg total) by mouth 2 (two) times daily. 12/16/14  Yes Barton Dubois, MD  levothyroxine (SYNTHROID, LEVOTHROID) 100 MCG tablet Take 100 mcg by mouth daily.   Yes Historical Provider, MD  potassium chloride SA (K-DUR,KLOR-CON) 20 MEQ tablet TAKE ONE TABLET EACH DAY Patient taking differently: take one half tablet each day with furosemide 06/29/15  Yes Jerline Pain, MD  Rivaroxaban (XARELTO) 20 MG TABS Take 1 tablet (20 mg total) by mouth daily with breakfast. 09/12/12  Yes Lavone Orn, MD  tiotropium (SPIRIVA) 18 MCG inhalation capsule Place 18 mcg into inhaler and inhale daily.    Historical Provider, MD   BP 116/74 mmHg  Pulse 70  Temp(Src) 97.8 F (36.6 C) (Oral)  Resp 25  Ht 5\' 4"  (1.626 m)  Wt 244 lb 11.4 oz (111 kg)  BMI 41.98 kg/m2  SpO2 92% Physical Exam  Constitutional: She is oriented to person, place, and time. She appears well-developed and well-nourished. She appears distressed.  Speaking in short sentences, increased work of  breatahing  HENT:  Head: Normocephalic and atraumatic.  Mouth/Throat: Oropharynx is clear and moist. No oropharyngeal exudate.  No blood in OP, no evidence of recent bleeding  Eyes: Conjunctivae and EOM are normal. Pupils are equal, round, and reactive to light.  Neck: Normal range of motion. Neck supple.  No meningismus.  Cardiovascular: Normal rate, regular rhythm, normal heart sounds and intact distal pulses.   No murmur heard. Pulmonary/Chest: She is in respiratory distress. She has wheezes.  Decreased breath sounds with scattered wheezing, poor air exchange  Abdominal: Soft. She exhibits distension. There is no tenderness. There is no rebound and no guarding.  Reducible umbilical hernia  Musculoskeletal: Normal range of motion. She exhibits edema and tenderness.  10 cm hematoma to R lateral lower leg.    Neurological: She is alert and oriented to person, place, and  time. No cranial nerve deficit. She exhibits normal muscle tone. Coordination normal.  No ataxia on finger to nose bilaterally. No pronator drift. 5/5 strength throughout. CN 2-12 intact.Equal grip strength. Sensation intact.   Skin: Skin is warm.  Psychiatric: She has a normal mood and affect. Her behavior is normal.  Nursing note and vitals reviewed.   ED Course  Procedures (including critical care time) Labs Review Labs Reviewed  CBC WITH DIFFERENTIAL/PLATELET - Abnormal; Notable for the following:    WBC 11.5 (*)    Hemoglobin 8.9 (*)    HCT 32.0 (*)    MCH 22.5 (*)    MCHC 27.8 (*)    RDW 19.6 (*)    Neutro Abs 9.9 (*)    All other components within normal limits  BASIC METABOLIC PANEL - Abnormal; Notable for the following:    Potassium 3.4 (*)    Glucose, Bld 108 (*)    Calcium 8.6 (*)    All other components within normal limits  PROTIME-INR - Abnormal; Notable for the following:    Prothrombin Time 23.5 (*)    INR 2.11 (*)    All other components within normal limits  TROPONIN I - Abnormal; Notable  for the following:    Troponin I 0.04 (*)    All other components within normal limits  BRAIN NATRIURETIC PEPTIDE - Abnormal; Notable for the following:    B Natriuretic Peptide 737.3 (*)    All other components within normal limits  CBC WITH DIFFERENTIAL/PLATELET - Abnormal; Notable for the following:    RBC 3.82 (*)    Hemoglobin 8.4 (*)    HCT 30.8 (*)    MCH 22.0 (*)    MCHC 27.3 (*)    RDW 19.6 (*)    Neutro Abs 10.1 (*)    Lymphs Abs 0.3 (*)    All other components within normal limits  URINALYSIS, ROUTINE W REFLEX MICROSCOPIC (NOT AT [facility]) - Abnormal; Notable for the following:    Color, Urine AMBER (*)    APPearance CLOUDY (*)    Hgb urine dipstick TRACE (*)    Ketones, ur 15 (*)    Protein, ur 30 (*)    Nitrite POSITIVE (*)    Leukocytes, UA SMALL (*)    All other components within normal limits  URINE MICROSCOPIC-ADD ON - Abnormal; Notable for the following:    Squamous Epithelial / LPF 0-5 (*)    Bacteria, UA MANY (*)    Casts HYALINE CASTS (*)    All other components within normal limits  I-STAT CG4 LACTIC ACID, ED - Abnormal; Notable for the following:    Lactic Acid, Venous 2.05 (*)    All other components within normal limits  I-STAT ARTERIAL BLOOD GAS, ED - Abnormal; Notable for the following:    pO2, Arterial 73.0 (*)    Bicarbonate 26.0 (*)    All other components within normal limits  I-STAT CG4 LACTIC ACID, ED - Abnormal; Notable for the following:    Lactic Acid, Venous 2.55 (*)    All other components within normal limits  I-STAT CG4 LACTIC ACID, ED - Abnormal; Notable for the following:    Lactic Acid, Venous 2.29 (*)    All other components within normal limits  MRSA PCR SCREENING  CULTURE, BLOOD (ROUTINE X 2)  CULTURE, BLOOD (ROUTINE X 2)  URINE CULTURE  PROCALCITONIN  APTT  LACTIC ACID, PLASMA  CBC  I-STAT CG4 LACTIC ACID, ED    Imaging Review Dg  Chest Portable 1 View  09/11/2015  CLINICAL DATA:  Shortness of breath 1 day. EXAM:  PORTABLE CHEST 1 VIEW COMPARISON:  12/13/2014 and 12/11/2014 FINDINGS: Lordotic technique demonstrated. Subtle stable elevation of the left hemidiaphragm. Lungs are hypoinflated with minimal prominence of the central perihilar markings likely minimal vascular congestion. No definite effusion. No lobar consolidation. Stable moderate cardiomegaly. There is calcified plaque over the aortic arch. Remainder of the exam is unchanged. IMPRESSION: Stable cardiomegaly with findings suggesting mild vascular congestion. Electronically Signed   By: Marin Olp M.D.   On: 09/11/2015 15:26   Dg Tibia/fibula Right Port  09/11/2015  CLINICAL DATA:  Status post fall today with a right lower leg injury and pain. Initial encounter. EXAM: PORTABLE RIGHT TIBIA AND FIBULA - 2 VIEW COMPARISON:  None. FINDINGS: No acute bony or joint abnormality is identified. Degenerative change about the right knee is noted. Scattered atherosclerotic calcifications are seen. IMPRESSION: No acute abnormality. Electronically Signed   By: Inge Rise M.D.   On: 09/11/2015 15:26   I have personally reviewed and evaluated these images and lab results as part of my medical decision-making.   EKG Interpretation   Date/Time:  Monday September 11 2015 14:54:12 EST Ventricular Rate:  79 PR Interval:  231 QRS Duration: 103 QT Interval:  304 QTC Calculation: 348 R Axis:   19 Text Interpretation:  Sinus rhythm Prolonged PR interval Low voltage,  extremity and precordial leads Borderline repolarization abnormality  Baseline wander in lead(s) V1 lateral ST depressions Confirmed by Wyvonnia Dusky   MD, Sandy Level [phone]) on 09/11/2015 3:12:59 PM      MDM   Final diagnoses:  COPD exacerbation ([place])  Fall, initial encounter  Leg hematoma, right, initial encounter   Fall with RLE injury.  Did not hit head.  On xarelto.  Increased WOB with coughing and wheezing.   patient with  Worsening oxygen requirement requiring 5 L to maintain her  saturations in the mid 90s. She is wheezing on exam with decreased air exchange. She is given nebulizers and steroids on arrival as well as magnesium.   Chest x-ray does not show any infiltrate. X-ray of her leg is negative for fracture but clinically she does have a large hematoma.   Her ABG does not show any signs of CO2 retention and she feels improved so BiPAP was held at this point. Labs shows stable anemia. There is also stable mild troponin elevation. INR is therapeutic.   Repeat lactate is elevated. Hospitalist requests activation of code sepsis even though patient is afebrile with stable vital signs. She was given IV fluid, IV antibiotics for presumed COPD exacerbation with possible pneumonia. UA positive for infection. Admission d.w Dr. [place] Pink to step down unit.  CRITICAL CARE Performed by: Ezequiel Essex Total critical care time: 35 minutes Critical care time was exclusive of separately billable procedures and treating other patients. Critical care was necessary to treat or prevent imminent or life-threatening deterioration. Critical care was time spent personally by me on the following activities: development of treatment plan with patient and/or surrogate as well as nursing, discussions with consultants, evaluation of patient's response to treatment, examination of patient, obtaining history from patient or surrogate, ordering and performing treatments and interventions, ordering and review of laboratory studies, ordering and review of radiographic studies, pulse oximetry and re-evaluation of patient's condition.    Ezequiel Essex, MD 09/11/15 2352

## 2015-09-12 DIAGNOSIS — N390 Urinary tract infection, site not specified: Secondary | ICD-10-CM | POA: Diagnosis present

## 2015-09-12 LAB — CBC
HEMATOCRIT: 30.6 % — AB (ref 36.0–46.0)
Hemoglobin: 8.3 g/dL — ABNORMAL LOW (ref 12.0–15.0)
MCH: 22.1 pg — ABNORMAL LOW (ref 26.0–34.0)
MCHC: 27.1 g/dL — ABNORMAL LOW (ref 30.0–36.0)
MCV: 81.4 fL (ref 78.0–100.0)
PLATELETS: 304 10*3/uL (ref 150–400)
RBC: 3.76 MIL/uL — AB (ref 3.87–5.11)
RDW: 19.5 % — ABNORMAL HIGH (ref 11.5–15.5)
WBC: 10.9 10*3/uL — AB (ref 4.0–10.5)

## 2015-09-12 LAB — LACTIC ACID, PLASMA: Lactic Acid, Venous: 1.8 mmol/L (ref 0.5–2.0)

## 2015-09-12 MED ORDER — FUROSEMIDE 10 MG/ML IJ SOLN
INTRAMUSCULAR | Status: AC
  Filled 2015-09-12: qty 4

## 2015-09-12 MED ORDER — FUROSEMIDE 10 MG/ML IJ SOLN
40.0000 mg | Freq: Two times a day (BID) | INTRAMUSCULAR | Status: DC
  Administered 2015-09-12 – 2015-09-14 (×6): 40 mg via INTRAVENOUS
  Filled 2015-09-12 (×5): qty 4

## 2015-09-12 NOTE — Progress Notes (Signed)
Patient is currently on [facility] and sats are 96%. Patient is in no distress and all vitals are stable. BIPAP is not needed at this time.

## 2015-09-12 NOTE — Progress Notes (Signed)
PROGRESS NOTE  MICHELLA MONTEON [id number] DOB: 06-13-1946 DOA: 09/11/2015 PCP: [name], MD  HPI/Recap of past 24 hours: Patient is an 69 year old female past oral history of COPD on chronic 3 L oxygen, atrial fibrillation on chronic anticoagulation and CAD who had been having for the past 1 week increased dyspnea on exertion and nonproductive cough, but came to the emergency room on 12/19 after she was headed to see her PCP, but fell hitting her left leg which led to a large hematoma. In the emergency room, patient was found to have mild cytosis, lactic acidosis and acute respiratory failure requiring 5-6 L of oxygen. Was suspected the patient had a pneumonia causing sepsis. Chest x-ray was not very revealing, likely from dehydration. Patient started on IV antibiotics and treated with sepsis protocol with aggressive fluid resuscitation. UTI came back later also she noting infection  Patient today for an better. Her lactate level is now slightly below 2. Patient still requiring 6 L of oxygen, but otherwise overall feels better.  Assessment/Plan: Principal Problem:   Sepsis ([place]) secondary to community-acquired pneumonia and urinary tract infection: Patient meets criteria for sepsis given lactic acidosis, tachypnea, tachycardia, leukocytosis and rhythm and pulmonary source. Now stabilized. Patient adequately volume resuscitated and now volume overloaded so diurese. On antibiotics which should cover both pneumonia and urinary tract infection. Monitor for cultures. Active Problems:   Morbid obesity [facility]): Patient meets criteria with BMI greater than 40.     Acute and chronic respiratory failure with hypoxia ([facility]) secondary to possible pneumonia and also COPD exacerbation and now volume overload: We'll continue to wean down steroids. Continue nebulizers. Continue supplemental oxygen and antibiotics. With aggressive fluid resuscitation, she likely volume overloaded now that sepsis is stabilized,  we'll start to diurese.    Chronic a-fib ([place]): Continue on xarelto. Patient's chads 2 score is 4. Rate control.    Code Status: Full code as confirmed by patient   Family Communication: Sister at the bedside   Disposition Plan: Continue in stepdown until oxygen much closer to baseline.     Consultants:   None   Procedures None   Antibiotics:  IV Rocephin 12/19-present  IV Zithromax 12/19-present    Objective: BP 148/85 mmHg  Pulse 81  Temp(Src) 97.2 F (36.2 C) (Oral)  Resp 21  Ht 5\' 4"  (1.626 m)  Wt 111 kg (244 lb 11.4 oz)  BMI 41.98 kg/m2  SpO2 95%  Intake/Output Summary (Last 24 hours) at 09/12/15 1320 Last data filed at 09/12/15 1100  Gross per 24 hour  Intake 3797.25 ml  Output    550 ml  Net 3247.25 ml   Filed Weights   09/11/15 2000  Weight: 111 kg (244 lb 11.4 oz)    Exam:   General:  alert and oriented 3, no acute distress   Cardiovascular: irregular rhythm, rate controlled, 2/6 systolic ejection   Respiratory: decreased breath sounds throughout   Abdomen: soft, obese, nontender, positive bowel sounds   Musculoskeletal: 1+ pitting edema from the knees down, right lower extremity notes a very large hematoma from where she fell    Data Reviewed: Basic Metabolic Panel:  Recent Labs Lab 09/11/15 1527  NA 141  K 3.4*  CL 105  CO2 26  GLUCOSE 108*  BUN 12  CREATININE 0.94  CALCIUM 8.6*   Liver Function Tests: No results for input(s): AST, ALT, ALKPHOS, BILITOT, PROT, ALBUMIN in the last 168 hours. No results for input(s): LIPASE, AMYLASE in the last 168 hours. No  results for input(s): AMMONIA in the last 168 hours. CBC:  Recent Labs Lab 09/11/15 1527 09/11/15 1944 09/12/15 0322  WBC 11.5* 10.5 10.9*  NEUTROABS 9.9* 10.1*  --   HGB 8.9* 8.4* 8.3*  HCT 32.0* 30.8* 30.6*  MCV 80.8 80.6 81.4  PLT 335 272 304   Cardiac Enzymes:    Recent Labs Lab 09/11/15 1527  TROPONINI 0.04*   BNP (last 3 results)  Recent  Labs  12/11/14 1958 09/11/15 1527  BNP 1060.6* 737.3*    ProBNP (last 3 results) No results for input(s): PROBNP in the last 8760 hours.  CBG: No results for input(s): GLUCAP in the last 168 hours.  Recent Results (from the past 240 hour(s))  MRSA PCR Screening     Status: None   Collection Time: 09/11/15  8:23 PM  Result Value Ref Range Status   MRSA by PCR NEGATIVE NEGATIVE Final    Comment:        The GeneXpert MRSA Assay (FDA approved for NASAL specimens only), is one component of a comprehensive MRSA colonization surveillance program. It is not intended to diagnose MRSA infection nor to guide or monitor treatment for MRSA infections.   Urine culture     Status: None (Preliminary result)   Collection Time: 09/11/15  9:48 PM  Result Value Ref Range Status   Specimen Description URINE, CLEAN CATCH  Final   Special Requests NONE  Final   Culture CULTURE REINCUBATED FOR BETTER GROWTH  Final   Report Status PENDING  Incomplete     Studies: Dg Chest Portable 1 View  09/11/2015  CLINICAL DATA:  Shortness of breath 1 day. EXAM: PORTABLE CHEST 1 VIEW COMPARISON:  12/13/2014 and 12/11/2014 FINDINGS: Lordotic technique demonstrated. Subtle stable elevation of the left hemidiaphragm. Lungs are hypoinflated with minimal prominence of the central perihilar markings likely minimal vascular congestion. No definite effusion. No lobar consolidation. Stable moderate cardiomegaly. There is calcified plaque over the aortic arch. Remainder of the exam is unchanged. IMPRESSION: Stable cardiomegaly with findings suggesting mild vascular congestion. Electronically Signed   By: Marin Olp M.D.   On: 09/11/2015 15:26   Dg Tibia/fibula Right Port  09/11/2015  CLINICAL DATA:  Status post fall today with a right lower leg injury and pain. Initial encounter. EXAM: PORTABLE RIGHT TIBIA AND FIBULA - 2 VIEW COMPARISON:  None. FINDINGS: No acute bony or joint abnormality is identified. Degenerative  change about the right knee is noted. Scattered atherosclerotic calcifications are seen. IMPRESSION: No acute abnormality. Electronically Signed   By: Inge Rise M.D.   On: 09/11/2015 15:26    Scheduled Meds: . antiseptic oral rinse  7 mL Mouth Rinse q12n4p  . atorvastatin  10 mg Oral QHS  . azithromycin  500 mg Intravenous Q24H  . budesonide  0.25 mg Nebulization BID  . cefTRIAXone (ROCEPHIN)  IV  2 g Intravenous Q24H  . chlorhexidine  15 mL Mouth Rinse BID  . diltiazem  120 mg Oral BID  . furosemide      . furosemide  40 mg Intravenous Q12H  . ipratropium  0.5 mg Nebulization Once  . ipratropium-albuterol  3 mL Nebulization Once  . levalbuterol  0.63 mg Nebulization Q6H  . levothyroxine  100 mcg Oral QAC breakfast  . methylPREDNISolone (SOLU-MEDROL) injection  40 mg Intravenous Q12H  . rivaroxaban  20 mg Oral Q breakfast  . sodium chloride  3 mL Intravenous Q12H    Continuous Infusions: . sodium chloride 0 mL/hr at 09/11/15 2151  Time spent:  35 minutes  [facility] Pager [phone] . If 7PM-7AM, please contact night-coverage at [email or web address], password [facility] 09/12/2015, 1:20 PM  LOS: 1 day

## 2015-09-12 NOTE — Care Management Note (Addendum)
Case Management Note  Patient Details  Name: TUMEKA ASELTINE MRN: [id number] Date of Birth: 07-22-1946  Subjective/Objective:    Adm w sepsis                Action/Plan: lives w sister, has home o2 w ahc   Expected Discharge Date:                  Expected Discharge Plan:     In-House Referral:     Discharge planning Services     Post Acute Care Choice:    Choice offered to:     DME Arranged:    DME Agency:     HH Arranged:    [place] Agency:     Status of Service:     Medicare Important Message Given:    Date Medicare IM Given:    Medicare IM give by:    Date Additional Medicare IM Given:    Additional Medicare Important Message give by:     If discussed at [place] of Stay Meetings, dates discussed:    Additional Comments: ur review done  Lacretia Leigh, RN 09/12/2015, 7:41 AM

## 2015-09-13 LAB — CBC
HEMATOCRIT: 31.3 % — AB (ref 36.0–46.0)
Hemoglobin: 8.7 g/dL — ABNORMAL LOW (ref 12.0–15.0)
MCH: 22.9 pg — ABNORMAL LOW (ref 26.0–34.0)
MCHC: 27.8 g/dL — AB (ref 30.0–36.0)
MCV: 82.4 fL (ref 78.0–100.0)
PLATELETS: 287 10*3/uL (ref 150–400)
RBC: 3.8 MIL/uL — ABNORMAL LOW (ref 3.87–5.11)
RDW: 19.9 % — AB (ref 11.5–15.5)
WBC: 16.6 10*3/uL — ABNORMAL HIGH (ref 4.0–10.5)

## 2015-09-13 LAB — BASIC METABOLIC PANEL
Anion gap: 11 (ref 5–15)
BUN: 18 mg/dL (ref 6–20)
CALCIUM: 8.6 mg/dL — AB (ref 8.9–10.3)
CO2: 24 mmol/L (ref 22–32)
CREATININE: 1.07 mg/dL — AB (ref 0.44–1.00)
Chloride: 105 mmol/L (ref 101–111)
GFR, EST AFRICAN AMERICAN: 60 mL/min — AB (ref >60)
GFR, EST NON AFRICAN AMERICAN: 52 mL/min — AB (ref >60)
GLUCOSE: 144 mg/dL — AB (ref 65–99)
Potassium: 3.5 mmol/L (ref 3.5–5.1)
Sodium: 140 mmol/L (ref 135–145)

## 2015-09-13 LAB — URINE CULTURE

## 2015-09-13 LAB — PROCALCITONIN

## 2015-09-13 LAB — LACTIC ACID, PLASMA: Lactic Acid, Venous: 1.3 mmol/L (ref 0.5–2.0)

## 2015-09-13 MED ORDER — CEFTRIAXONE SODIUM 1 G IJ SOLR
1.0000 g | INTRAMUSCULAR | Status: AC
  Administered 2015-09-13 – 2015-09-15 (×3): 1 g via INTRAVENOUS
  Filled 2015-09-13 (×3): qty 10

## 2015-09-13 MED ORDER — CLONIDINE HCL 0.1 MG PO TABS
0.1000 mg | ORAL_TABLET | Freq: Two times a day (BID) | ORAL | Status: DC
  Administered 2015-09-13 – 2015-09-25 (×25): 0.1 mg via ORAL
  Filled 2015-09-13 (×26): qty 1

## 2015-09-13 MED ORDER — PREDNISONE 20 MG PO TABS
40.0000 mg | ORAL_TABLET | Freq: Every day | ORAL | Status: DC
  Administered 2015-09-14 – 2015-09-15 (×2): 40 mg via ORAL
  Filled 2015-09-13 (×2): qty 2

## 2015-09-13 MED ORDER — OXYCODONE HCL 5 MG PO TABS
5.0000 mg | ORAL_TABLET | ORAL | Status: DC | PRN
  Administered 2015-09-13 – 2015-09-24 (×5): 5 mg via ORAL
  Filled 2015-09-13 (×5): qty 1

## 2015-09-13 NOTE — Progress Notes (Signed)
Patients O2 on 4L sats reading 84%  O2 increased to 5L per [place] sats now 92%. Will continue to monitor patient.

## 2015-09-13 NOTE — Evaluation (Signed)
Physical Therapy Evaluation Patient Details Name: Caitlin Hampton MRN: [id number] DOB: 1945-09-24 Today's Date: 09/13/2015   History of Present Illness  Patient is an 69 year old female past oral history of COPD on chronic 3 L oxygen, atrial fibrillation on chronic anticoagulation and CAD who had been having for the past 1 week increased dyspnea on exertion and nonproductive cough, but came to the emergency room on 12/19 after she was headed to see her PCP, but fell hitting her left leg which led to a large hematoma. In the emergency room, patient was found to have mild cytosis, lactic acidosis and acute respiratory failure requiring 5-6 L of oxygen. Was suspected the patient had a pneumonia causing sepsis. Chest x-ray was not very revealing, likely from dehydration. Patient started on IV antibiotics and treated with sepsis protocol with aggressive fluid resuscitation. UTI came back later also she noting infection  Clinical Impression  Pt admitted with above diagnosis. Pt currently with functional limitations due to the deficits listed below (see PT Problem List). Pt was able to pivot to 3N1 and then to recliner.  Desat quite a bit needing venturi mask at 40%.  Terminated treatment and did not exercise given pts low tolerance to exercise. Will follow acutely.  Pt will benefit from skilled PT to increase their independence and safety with mobility to allow discharge to the venue listed below.      Follow Up Recommendations SNF;Supervision/Assistance - 24 hour    Equipment Recommendations  None recommended by PT    Recommendations for Other Services       Precautions / Restrictions Precautions Precautions: Fall Restrictions Weight Bearing Restrictions: No      Mobility  Bed Mobility Overal bed mobility: Needs Assistance;+2 for physical assistance Bed Mobility: Supine to Sit;Rolling Rolling: Mod assist   Supine to sit: Mod assist;+2 for physical assistance     General bed mobility  comments: Pt needed assist for LEs and elevation of trunk.  Transfers Overall transfer level: Needs assistance Equipment used: Rolling walker (2 wheeled) Transfers: Sit to/from [facility] Sit to Stand: Mod assist;+2 physical assistance;From elevated surface Stand pivot transfers: Mod assist;Min assist;+2 physical assistance       General transfer comment: Pt was able to stand to RW with assist to power up and cues for hand placement.  Took pivotal steps to 3N1.  Used 3N1 and then after needed total assist to be cleaned and then pivoted with R Wto recliner.  Desat to 83-85% on 6LO2 with sats hovering there.  After 2 min with no improvement, placed venturi mask at 40% with sats finally >90%-94% after 3 min with mask in place.  Notified nursing and left venturi on pt.    Ambulation/Gait                Stairs            Wheelchair Mobility    Modified Rankin (Stroke Patients Only)       Balance                                             Pertinent Vitals/Pain Pain Assessment: Faces Faces Pain Scale: Hurts little more Pain Location: bil LEs Pain Descriptors / Indicators: Aching Pain Intervention(s): Limited activity within patient's tolerance;Monitored during session;Repositioned  See above for desaturation     Home Living Family/patient expects to be discharged to::  Private residence Living Arrangements: Other relatives (sister) Available Help at Discharge: Family;Available 24 hours/day Type of Home: House Home Access: Stairs to enter   [employer] of Steps: 1 Home Layout: One level Home Equipment: Walker - 2 wheels;Toilet riser (5-6LO2)      Prior Function Level of Independence: Needs assistance   Gait / Transfers Assistance Needed: walked short distances with RW  ADL's / Homemaking Assistance Needed: sponge bathed sitting on toilet  Comments: used RW when ambulating, for past week and a half was requiring  help for mobility, at basline is independent with sponge bath, 3 falls in past 3 months     Hand Dominance        Extremity/Trunk Assessment   Upper Extremity Assessment: Defer to OT evaluation           Lower Extremity Assessment: Generalized weakness      Cervical / Trunk Assessment: Kyphotic  Communication   Communication: No difficulties  Cognition Arousal/Alertness: Awake/alert Behavior During Therapy: WFL for tasks assessed/performed Overall Cognitive Status: Within Functional Limits for tasks assessed                      General Comments General comments (skin integrity, edema, etc.): bil LEs weeping with bruising noted right LE with blister.    Exercises        Assessment/Plan    PT Assessment Patient needs continued PT services  PT Diagnosis Generalized weakness   PT Problem List Decreased activity tolerance;Decreased balance;Decreased mobility;Decreased knowledge of use of DME;Decreased safety awareness;Decreased knowledge of precautions;Decreased strength;Decreased range of motion;Pain  PT Treatment Interventions DME instruction;Gait training;Functional mobility training;Therapeutic activities;Therapeutic exercise;Balance training;Patient/family education   PT Goals (Current goals can be found in the Care Plan section) Acute Rehab PT Goals Patient Stated Goal: to get better PT Goal Formulation: With patient Time For Goal Achievement: 09/27/15 Potential to Achieve Goals: Good    Frequency Min 3X/week   Barriers to discharge        Co-evaluation               End of Session Equipment Utilized During Treatment: Gait belt;Oxygen Activity Tolerance: Patient limited by fatigue Patient left: in chair;with call bell/phone within reach;with chair alarm set Nurse Communication: Mobility status;Need for lift equipment         Time: [id number] PT Time Calculation (min) (ACUTE ONLY): 24 min   Charges:   PT Evaluation $Initial PT  Evaluation Tier I: 1 Procedure PT Treatments $Therapeutic Activity: 8-22 mins   PT G Codes:        Caitlin Hampton F 2015/09/15, 2:25 PM Caitlin Hampton,PT Acute Rehabilitation [phone] (pager)

## 2015-09-13 NOTE — Progress Notes (Addendum)
PROGRESS NOTE  Caitlin Hampton [id number] DOB: 1946/08/23 DOA: 09/11/2015 PCP: [name], MD  HPI/Recap of past 24 hours: Patient is an 69 year old female past oral history of COPD on chronic 3 L oxygen, atrial fibrillation on chronic anticoagulation and CAD who had been having for the past 1 week increased dyspnea on exertion and nonproductive cough, but came to the emergency room on 12/19 after she was headed to see her PCP, but fell hitting her left leg which led to a large hematoma. In the emergency room, patient was found to have mild cytosis, lactic acidosis and acute respiratory failure requiring 5-6 L of oxygen. Was suspected the patient had a pneumonia causing sepsis. Chest x-ray was not very revealing, likely from dehydration. Patient started on IV antibiotics and treated with sepsis protocol with aggressive fluid resuscitation. UTI came back later also she noting infection  Patient of the last few days has slowly continued to improve. This morning, needing 4-5 liters of oxygen nasal cannula she herself feels good. No complaints other than some mild right leg pain.   Assessment/Plan: Principal Problem:   Sepsis ([place]) secondary to community-acquired pneumonia and urinary tract infection: Patient meets criteria for sepsis given lactic acidosis, tachypnea, tachycardia, leukocytosis and rhythm and pulmonary source. Now stabilized. Patient adequately volume resuscitated and now volume overloaded so diurese. given normal Pro calcitonin levels, will discontinue Zithromax and continue Rocephin for UTI. Change Solu-Medrol to by mouth prednisone  Active Problems:   Morbid obesity [facility]): Patient meets criteria with BMI greater than 40.     Acute and chronic respiratory failure with hypoxia (H[facility]) secondary to possible pneumonia and also COPD exacerbation and now volume overload: We'll continue to wean down steroids. Continue nebulizers. Continue supplemental oxygen and antibiotics. With  aggressive fluid resuscitation, she likely volume overloaded now that sepsis is stabilizedtrying to diurese  Leukocytosis: Likely secondary to steroids given on antibiotics and normal pro calcitonin    Chronic a-fib ([place]): Continue on xarelto. Patient's chads 2 score is 4. Rate control.  Hematoma of right leg: Swelling significantly down, now only bruising.   Code Status: Full code as confirmed by patient   Family Communication: Sister at the bedside   Disposition Plan: Continue in stepdown until oxygen much closer to baseline.  Try to get out of bed, work with PT   Consultants:   None   Procedures None   Antibiotics:  IV Rocephin 12/19-present  IV Zithromax 12/112/21    Objective: BP 131/79 mmHg  Pulse 94  Temp(Src) 97.5 F (36.4 C) (Oral)  Resp 30  Ht 5\' 4"  (1.626 m)  Wt 114 kg (251 lb 5.2 oz)  BMI 43.12 kg/m2  SpO2 88%  Intake/Output Summary (Last 24 hours) at 09/13/15 1514 Last data filed at 09/13/15 1230  Gross per 24 hour  Intake    640 ml  Output   1100 ml  Net   -460 ml   Filed Weights   09/11/15 2000 09/13/15 0500  Weight: 111 kg (244 lb 11.4 oz) 114 kg (251 lb 5.2 oz)    Exam:   General:  alert and oriented 3, no acute distress   Cardiovascular: irregular rhythm, rate controlled, 2/6 systolic ejection   Respiratory: decreased breath sounds throughout   Abdomen: soft, obese, nontender, positive bowel sounds  Musculoskeletal: 1+ pitting edema from the knees downright leg, minimal swelling with some dark bruising  Data Reviewed: Basic Metabolic Panel:  Recent Labs Lab 09/11/15 1527 09/13/15 0400  NA 141 140  K  3.4* 3.5  CL 105 105  CO2 26 24  GLUCOSE 108* 144*  BUN 12 18  CREATININE 0.94 1.07*  CALCIUM 8.6* 8.6*   Liver Function Tests: No results for input(s): AST, ALT, ALKPHOS, BILITOT, PROT, ALBUMIN in the last 168 hours. No results for input(s): LIPASE, AMYLASE in the last 168 hours. No results for input(s): AMMONIA in the  last 168 hours. CBC:  Recent Labs Lab 09/11/15 1527 09/11/15 1944 09/12/15 0322 09/13/15 0400  WBC 11.5* 10.5 10.9* 16.6*  NEUTROABS 9.9* 10.1*  --   --   HGB 8.9* 8.4* 8.3* 8.7*  HCT 32.0* 30.8* 30.6* 31.3*  MCV 80.8 80.6 81.4 82.4  PLT 335 272 304 287   Cardiac Enzymes:    Recent Labs Lab 09/11/15 1527  TROPONINI 0.04*   BNP (last 3 results)  Recent Labs  12/11/14 1958 09/11/15 1527  BNP 1060.6* 737.3*    ProBNP (last 3 results) No results for input(s): PROBNP in the last 8760 hours.  CBG: No results for input(s): GLUCAP in the last 168 hours.  Recent Results (from the past 240 hour(s))  Blood Culture (routine x 2)     Status: None (Preliminary result)   Collection Time: 09/11/15  3:27 PM  Result Value Ref Range Status   Specimen Description BLOOD RIGHT FOREARM  Final   Special Requests BOTTLES DRAWN AEROBIC AND ANAEROBIC 5CC  Final   Culture NO GROWTH 2 DAYS  Final   Report Status PENDING  Incomplete  Blood Culture (routine x 2)     Status: None (Preliminary result)   Collection Time: 09/11/15  7:44 PM  Result Value Ref Range Status   Specimen Description BLOOD RIGHT HAND  Final   Special Requests BOTTLES DRAWN AEROBIC ONLY 6CC  Final   Culture NO GROWTH 2 DAYS  Final   Report Status PENDING  Incomplete  MRSA PCR Screening     Status: None   Collection Time: 09/11/15  8:23 PM  Result Value Ref Range Status   MRSA by PCR NEGATIVE NEGATIVE Final    Comment:        The GeneXpert MRSA Assay (FDA approved for NASAL specimens only), is one component of a comprehensive MRSA colonization surveillance program. It is not intended to diagnose MRSA infection nor to guide or monitor treatment for MRSA infections.   Urine culture     Status: None   Collection Time: 09/11/15  9:48 PM  Result Value Ref Range Status   Specimen Description URINE, CLEAN CATCH  Final   Special Requests NONE  Final   Culture MULTIPLE SPECIES PRESENT, SUGGEST RECOLLECTION  Final     Report Status 09/13/2015 FINAL  Final     Studies: No results found.  Scheduled Meds: . antiseptic oral rinse  7 mL Mouth Rinse q12n4p  . atorvastatin  10 mg Oral QHS  . budesonide  0.25 mg Nebulization BID  . cefTRIAXone (ROCEPHIN)  IV  1 g Intravenous Q24H  . chlorhexidine  15 mL Mouth Rinse BID  . cloNIDine  0.1 mg Oral BID  . diltiazem  120 mg Oral BID  . furosemide  40 mg Intravenous Q12H  . ipratropium  0.5 mg Nebulization Once  . ipratropium-albuterol  3 mL Nebulization Once  . levalbuterol  0.63 mg Nebulization Q6H  . levothyroxine  100 mcg Oral QAC breakfast  . [START ON 09/14/2015] predniSONE  40 mg Oral Q breakfast  . rivaroxaban  20 mg Oral Q breakfast  . sodium chloride  3 mL Intravenous Q12H    Continuous Infusions: . sodium chloride 10 mL/hr at 09/12/15 1900     Time spent:  25 minutes  [facility] Pager [phone] . If 7PM-7AM, please contact night-coverage at [email or web address], password [facility] 09/13/2015, 3:14 PM  LOS: 2 days

## 2015-09-13 NOTE — Progress Notes (Signed)
Patient c/o intermittent severe right lower leg pain. Dr. [place] Pink paged and made aware. Orders written.

## 2015-09-14 ENCOUNTER — Inpatient Hospital Stay (HOSPITAL_COMMUNITY): Payer: Medicare Other

## 2015-09-14 ENCOUNTER — Encounter (HOSPITAL_COMMUNITY): Payer: Self-pay | Admitting: Internal Medicine

## 2015-09-14 DIAGNOSIS — R0600 Dyspnea, unspecified: Secondary | ICD-10-CM

## 2015-09-14 DIAGNOSIS — I5033 Acute on chronic diastolic (congestive) heart failure: Secondary | ICD-10-CM

## 2015-09-14 DIAGNOSIS — J189 Pneumonia, unspecified organism: Secondary | ICD-10-CM | POA: Diagnosis present

## 2015-09-14 DIAGNOSIS — S8011XA Contusion of right lower leg, initial encounter: Secondary | ICD-10-CM | POA: Diagnosis present

## 2015-09-14 DIAGNOSIS — E039 Hypothyroidism, unspecified: Secondary | ICD-10-CM

## 2015-09-14 DIAGNOSIS — S8011XD Contusion of right lower leg, subsequent encounter: Secondary | ICD-10-CM

## 2015-09-14 HISTORY — DX: Pneumonia, unspecified organism: J18.9

## 2015-09-14 LAB — BASIC METABOLIC PANEL
ANION GAP: 11 (ref 5–15)
BUN: 26 mg/dL — ABNORMAL HIGH (ref 6–20)
CHLORIDE: 99 mmol/L — AB (ref 101–111)
CO2: 30 mmol/L (ref 22–32)
Calcium: 8.4 mg/dL — ABNORMAL LOW (ref 8.9–10.3)
Creatinine, Ser: 1.11 mg/dL — ABNORMAL HIGH (ref 0.44–1.00)
GFR calc Af Amer: 57 mL/min — ABNORMAL LOW (ref >60)
GFR, EST NON AFRICAN AMERICAN: 49 mL/min — AB (ref >60)
GLUCOSE: 129 mg/dL — AB (ref 65–99)
POTASSIUM: 3.5 mmol/L (ref 3.5–5.1)
Sodium: 140 mmol/L (ref 135–145)

## 2015-09-14 LAB — TROPONIN I
TROPONIN I: 0.04 ng/mL — AB (ref <0.031)
Troponin I: 0.05 ng/mL — ABNORMAL HIGH (ref <0.031)

## 2015-09-14 LAB — BRAIN NATRIURETIC PEPTIDE: B NATRIURETIC PEPTIDE 5: 658 pg/mL — AB (ref 0.0–100.0)

## 2015-09-14 LAB — TSH: TSH: 1.984 u[IU]/mL (ref 0.350–4.500)

## 2015-09-14 MED ORDER — IPRATROPIUM BROMIDE 0.02 % IN SOLN
0.5000 mg | Freq: Four times a day (QID) | RESPIRATORY_TRACT | Status: DC
  Administered 2015-09-14 – 2015-09-15 (×4): 0.5 mg via RESPIRATORY_TRACT
  Filled 2015-09-14 (×4): qty 2.5

## 2015-09-14 MED ORDER — ARFORMOTEROL TARTRATE 15 MCG/2ML IN NEBU
15.0000 ug | INHALATION_SOLUTION | Freq: Two times a day (BID) | RESPIRATORY_TRACT | Status: DC
Start: 2015-09-14 — End: 2015-10-03
  Administered 2015-09-14 – 2015-10-03 (×32): 15 ug via RESPIRATORY_TRACT
  Filled 2015-09-14 (×40): qty 2

## 2015-09-14 MED ORDER — RIVAROXABAN 20 MG PO TABS: 20.0000 mg | ORAL_TABLET | Freq: Every day | ORAL | Status: DC

## 2015-09-14 MED ORDER — LEVALBUTEROL HCL 0.63 MG/3ML IN NEBU: 0.6300 mg | INHALATION_SOLUTION | RESPIRATORY_TRACT | Status: DC | PRN

## 2015-09-14 MED ORDER — IPRATROPIUM BROMIDE 0.02 % IN SOLN: 0.5000 mg | RESPIRATORY_TRACT | Status: DC | PRN

## 2015-09-14 NOTE — Consult Note (Signed)
Reason for Consult:  Right leg with large hematoma Referring Physician: Grandville Silos, MD [facility]  Caitlin Hampton is an 69 y.o. female.  HPI:   69 yo female with multiple medical problems who is also on blood thinning medications.  She did accidentally bump her right leg earlier this week and sustained a large contusion to her right shin.  This has now developed into a very large hematoma.  Ortho is asked to assess the hematoma and gives recs for eval and tx.  Past Medical History  Diagnosis Date  . Hypertension   . Thyroid disease   . COPD (chronic obstructive pulmonary disease) ([place])   . Anxiety   . H/O hiatal hernia   . Hypothyroidism   . Shortness of breath   . Atrial flutter ([name]) 09/07/2012    , RVR/ hospital 12/13- rate control and Echo normal- on xarelto, when spontaneously converted she had bradycardia on Metoprolol and Digoxin  . GERD (gastroesophageal reflux disease)   . Rhinitis   . Parotitis     , on CT of face- 12/13  . CHF (congestive heart failure) ([place])   . Hx of echocardiogram     Echo (8/15):  EF 60-65%  . NSTEMI (non-ST elevated myocardial infarction) ([place])   . CAP (community acquired pneumonia) 09/14/2015    Past Surgical History  Procedure Laterality Date  . Tonsillectomy    . Breast surgery      benign R brest CA  . Cardioversion N/A 04/02/2014    Procedure: CARDIOVERSION;  Surgeon: Josue Hector, MD;  Location: [place];  Service: Cardiovascular;  Laterality: N/A;  . Cardioversion N/A 08/17/2014    Procedure: CARDIOVERSION;  Surgeon: [name], MD;  Location: [facility] ENDOSCOPY;  Service: Cardiovascular;  Laterality: N/A;    Family History  Problem Relation Age of Onset  . CAD Mother   . Stroke Mother   . Prostate cancer Father   . Dementia Father     Social History:  reports that she quit smoking about 3 years ago. Her smoking use included Cigarettes. She smoked 1.50 packs per day. She does not have any smokeless tobacco history on file. She  reports that she does not drink alcohol or use illicit drugs.  Allergies: No Known Allergies  Medications: I have reviewed the patient's current medications.  Results for orders placed or performed during the hospital encounter of 09/11/15 (from the past 48 hour(s))  Procalcitonin     Status: None   Collection Time: 09/13/15  4:00 AM  Result Value Ref Range   Procalcitonin <0.10 ng/mL    Comment:        Interpretation: PCT (Procalcitonin) <= 0.5 ng/mL: Systemic infection (sepsis) is not likely. Local bacterial infection is possible. (NOTE)         ICU PCT Algorithm               Non ICU PCT Algorithm    ----------------------------     ------------------------------         PCT < 0.25 ng/mL                 PCT < 0.1 ng/mL     Stopping of antibiotics            Stopping of antibiotics       strongly encouraged.               strongly encouraged.    ----------------------------     ------------------------------  PCT level decrease by               PCT < 0.25 ng/mL       >= 80% from peak PCT       OR PCT 0.25 - 0.5 ng/mL          Stopping of antibiotics                                             encouraged.     Stopping of antibiotics           encouraged.    ----------------------------     ------------------------------       PCT level decrease by              PCT >= 0.25 ng/mL       < 80% from peak PCT        AND PCT >= 0.5 ng/mL            Continuin g antibiotics                                              encouraged.       Continuing antibiotics            encouraged.    ----------------------------     ------------------------------     PCT level increase compared          PCT > 0.5 ng/mL         with peak PCT AND          PCT >= 0.5 ng/mL             Escalation of antibiotics                                          strongly encouraged.      Escalation of antibiotics        strongly encouraged.   CBC     Status: Abnormal   Collection Time: 09/13/15  4:00 AM    Result Value Ref Range   WBC 16.6 (H) 4.0 - 10.5 K/uL   RBC 3.80 (L) 3.87 - 5.11 MIL/uL   Hemoglobin 8.7 (L) 12.0 - 15.0 g/dL   HCT 31.3 (L) 36.0 - 46.0 %   MCV 82.4 78.0 - 100.0 fL   MCH 22.9 (L) 26.0 - 34.0 pg   MCHC 27.8 (L) 30.0 - 36.0 g/dL   RDW 19.9 (H) 11.5 - 15.5 %   Platelets 287 150 - 400 K/uL  Basic metabolic panel     Status: Abnormal   Collection Time: 09/13/15  4:00 AM  Result Value Ref Range   Sodium 140 135 - 145 mmol/L   Potassium 3.5 3.5 - 5.1 mmol/L   Chloride 105 101 - 111 mmol/L   CO2 24 22 - 32 mmol/L   Glucose, Bld 144 (H) 65 - 99 mg/dL   BUN 18 6 - 20 mg/dL   Creatinine, Ser 1.07 (H) 0.44 - 1.00 mg/dL   Calcium 8.6 (L) 8.9 - 10.3 mg/dL   GFR calc non Af Amer 52 (L) >60 mL/min   GFR calc Af  Amer 60 (L) >60 mL/min    Comment: (NOTE) The eGFR has been calculated using the CKD EPI equation. This calculation has not been validated in all clinical situations. eGFR's persistently <60 mL/min signify possible Chronic Kidney Disease.    Anion gap 11 5 - 15  Lactic acid, plasma     Status: None   Collection Time: 09/13/15  4:00 AM  Result Value Ref Range   Lactic Acid, Venous 1.3 0.5 - 2.0 mmol/L  Basic metabolic panel     Status: Abnormal   Collection Time: 09/14/15  2:28 AM  Result Value Ref Range   Sodium 140 135 - 145 mmol/L   Potassium 3.5 3.5 - 5.1 mmol/L   Chloride 99 (L) 101 - 111 mmol/L   CO2 30 22 - 32 mmol/L   Glucose, Bld 129 (H) 65 - 99 mg/dL   BUN 26 (H) 6 - 20 mg/dL   Creatinine, Ser 1.11 (H) 0.44 - 1.00 mg/dL   Calcium 8.4 (L) 8.9 - 10.3 mg/dL   GFR calc non Af Amer 49 (L) >60 mL/min   GFR calc Af Amer 57 (L) >60 mL/min    Comment: (NOTE) The eGFR has been calculated using the CKD EPI equation. This calculation has not been validated in all clinical situations. eGFR's persistently <60 mL/min signify possible Chronic Kidney Disease.    Anion gap 11 5 - 15    Dg Chest 2 View  09/14/2015  CLINICAL DATA:  Shortness of Breath EXAM:  CHEST  2 VIEW COMPARISON:  September 11, 2015 FINDINGS: There is patchy atelectasis in each lung base. There is mild interstitial edema with cardiomegaly and borderline pulmonary venous hypertension. No adenopathy. There is atherosclerotic calcification in the aorta. There is degenerative change in the thoracic spine. There is a small right pleural effusion. IMPRESSION: Findings indicative of a degree of congestive heart failure. Electronically Signed   By: Lowella Grip III M.D.   On: 09/14/2015 09:59    ROS Blood pressure 130/104, pulse 98, temperature 97.4 F (36.3 C), temperature source Axillary, resp. rate 25, height _0  (1.626 m), weight 114 kg (251 lb 5.2 oz), SpO2 90 %. Physical Exam  Constitutional: She is oriented to person, place, and time. She appears well-developed and well-nourished.  Eyes: Pupils are equal, round, and reactive to light.  Respiratory: She is in respiratory distress.  GI: Soft.  Musculoskeletal:       Legs: Neurological: She is alert and oriented to person, place, and time.    Assessment/Plan: Large right leg hematoma 1)  I was able to use a #10 blade at the bedside and remove necrotic skin and evacuate a very large hematoma.  We then packed the wound with a wet-to-dry dressing.  This will unfortunately take a while to heal and I may put a wound vac on her right leg wound after a day of we-to-dry dressings.  Will follow closely.  Mcarthur Rossetti 09/14/2015, 12:02 PM

## 2015-09-14 NOTE — NC FL2 (Signed)
[place] LEVEL OF CARE SCREENING TOOL     IDENTIFICATION  Patient Name: Caitlin Hampton Birthdate: 06/17/1946 Sex: female Admission Date (Current Location): 09/11/2015  [facility] and [place] Number:  [employer] and Address:  [facility] [employer], [place] [address], [place], [place] [address]      Provider Number: [id number]  Attending Physician Name and Address:  Eugenie Filler, MD  Relative Name and Phone Number:       Current Level of Care: Hospital Recommended Level of Care: [place] Prior Approval Number:    Date Approved/Denied:   PASRR Number: [id number]  Discharge Plan: SNF    Current Diagnoses: Patient Active Problem List   Diagnosis Date Noted  . Hematoma of right lower extremity 09/14/2015  . CAP (community acquired pneumonia) 09/14/2015  . UTI (urinary tract infection) 09/12/2015  . COPD exacerbation ([place]) 09/11/2015  . Sepsis ([place]) 09/11/2015  . Acute and chronic respiratory failure with hypoxia ([place]) 09/11/2015  . Chronic a-fib ([place]) 09/11/2015  . Chronic diastolic heart failure ([place]) 02/06/2015  . Morbid obesity ([place]) 02/06/2015  . Other emphysema ([place]) 02/06/2015  . Lower extremity edema 12/26/2014  . Acute on chronic diastolic CHF (congestive heart failure) ([place]) 12/12/2014  . Acute on chronic renal failure ([place]) 12/12/2014  . Chronic respiratory failure ([place]) 12/12/2014  . Cellulitis and abscess of leg 12/12/2014  . HX: anticoagulation 12/12/2014  . Elevated troponin 12/12/2014  . Chronic systolic CHF (congestive heart failure) ([place]) 12/11/2014  . Acute on chronic respiratory failure ([place]) 12/11/2014  . GI bleed 12/11/2014  . Anemia, iron deficiency   . Cellulitis of right lower extremity   . Other specified hypotension   . Leukocytosis   . DVT (deep venous thrombosis) ([place])   . Acute renal failure (ARF) ([place]) 04/03/2014  . Hypertension   . COPD (chronic obstructive pulmonary disease)  ([place])   . Hypothyroidism   . Shortness of breath   . CHF (congestive heart failure) ([place])   . Respiratory failure requiring O2, inhalers 09/09/2012  . COPD with exacerbation ([place]) 09/09/2012  . Acute diastolic CHF (congestive heart failure) ([place]) 09/09/2012  . Obese 09/08/2012  . Atrial flutter with rapid ventricular response, unknown duration 09/07/2012  . HTN (hypertension), LVH with NL LVF 09/07/2012  . 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 Parotiditis, with lt facial swelling, on ABs 09/07/2012  . Atrial flutter ([place]) 09/07/2012    Orientation RESPIRATION BLADDER Height & Weight    Self, Time  O2 Continent 5\' 4"  (162.6 cm) 251 lbs.  BEHAVIORAL SYMPTOMS/MOOD NEUROLOGICAL BOWEL NUTRITION STATUS      Continent Diet (please review discharge summary for dietary needs)  AMBULATORY STATUS COMMUNICATION OF NEEDS Skin   Extensive Assist Verbally Bruising (leg)                       Personal Care Assistance Level of Assistance  Bathing, Dressing Bathing Assistance: Limited assistance   Dressing Assistance: Limited assistance     Functional Limitations Info             [place]  PT (By licensed PT), OT (By licensed OT)                    Contractures Contractures Info: Not present    Additional Factors Info  Current Medications (09/14/2015):  This is the current hospital active medication list Current Facility-Administered Medications  Medication Dose Route Frequency Provider Last Rate Last Dose  . 0.9 %  sodium chloride infusion   Intravenous Continuous Annita Brod, MD 10 mL/hr at 09/12/15 1900    . acetaminophen (TYLENOL) tablet 650 mg  650 mg Oral Q6H PRN Annita Brod, MD       Or  . acetaminophen (TYLENOL) suppository 650 mg  650 mg Rectal Q6H PRN Annita Brod, MD      . antiseptic oral rinse (CPC / CETYLPYRIDINIUM CHLORIDE 0.05%) solution 7 mL  7 mL  Mouth Rinse q12n4p Annita Brod, MD   7 mL at 09/13/15 1629  . arformoterol (BROVANA) nebulizer solution 15 mcg  15 mcg Nebulization BID Irine Seal V, MD      . atorvastatin (LIPITOR) tablet 10 mg  10 mg Oral QHS Annita Brod, MD   10 mg at 09/13/15 2127  . budesonide (PULMICORT) nebulizer solution 0.25 mg  0.25 mg Nebulization BID Annita Brod, MD   0.25 mg at 09/14/15 0757  . cefTRIAXone (ROCEPHIN) 1 g in dextrose 5 % 50 mL IVPB  1 g Intravenous Q24H Annita Brod, MD   1 g at 09/13/15 1628  . chlorhexidine (PERIDEX) 0.12 % solution 15 mL  15 mL Mouth Rinse BID Annita Brod, MD   15 mL at 09/14/15 1005  . cloNIDine (CATAPRES) tablet 0.1 mg  0.1 mg Oral BID Annita Brod, MD   0.1 mg at 09/14/15 1005  . diltiazem (CARDIZEM CD) 24 hr capsule 120 mg  120 mg Oral BID Annita Brod, MD   120 mg at 09/14/15 1005  . furosemide (LASIX) injection 40 mg  40 mg Intravenous Q12H Annita Brod, MD   40 mg at 09/14/15 1007  . ipratropium (ATROVENT) nebulizer solution 0.5 mg  0.5 mg Nebulization Once Ezequiel [place], MD   0.5 mg at 09/11/15 1610  . ipratropium (ATROVENT) nebulizer solution 0.5 mg  0.5 mg Nebulization Q6H Irine Seal V, MD      . ipratropium (ATROVENT) nebulizer solution 0.5 mg  0.5 mg Nebulization Q2H PRN Eugenie Filler, MD      . ipratropium-albuterol (DUONEB) 0.5-2.5 (3) MG/3ML nebulizer solution 3 mL  3 mL Nebulization Once Ezequiel [place], MD   3 mL at 09/11/15 1610  . levalbuterol (XOPENEX) nebulizer solution 0.63 mg  0.63 mg Nebulization Q6H Annita Brod, MD   0.63 mg at 09/14/15 0757  . levalbuterol (XOPENEX) nebulizer solution 0.63 mg  0.63 mg Nebulization Q2H PRN Eugenie Filler, MD      . levothyroxine (SYNTHROID, LEVOTHROID) tablet 100 mcg  100 mcg Oral QAC breakfast Annita Brod, MD   100 mcg at 09/14/15 1005  . ondansetron (ZOFRAN) tablet 4 mg  4 mg Oral Q6H PRN Annita Brod, MD       Or  . ondansetron [facility])  injection 4 mg  4 mg Intravenous Q6H PRN Annita Brod, MD      . oxyCODONE (Oxy IR/ROXICODONE) immediate release tablet 5 mg  5 mg Oral Q4H PRN Annita Brod, MD   5 mg at 09/13/15 1802  . predniSONE (DELTASONE) tablet 40 mg  40 mg Oral Q breakfast Annita Brod, MD   40 mg at 09/14/15 1007  . [START ON 09/15/2015] rivaroxaban (XARELTO) tablet 20 mg  20 mg Oral Q breakfast Eugenie Filler, MD      .  sodium chloride 0.9 % injection 3 mL  3 mL Intravenous Q12H Annita Brod, MD   3 mL at 09/14/15 1007     Discharge Medications: Please see discharge summary for a list of discharge medications.  Relevant Imaging Results:  Relevant Lab Results:   Additional Information SSN: [id number]  Dulcy Fanny, LCSW

## 2015-09-14 NOTE — Clinical Social Work Note (Signed)
Clinical Social Work Assessment  Patient Details  Name: Caitlin Hampton MRN: [id number] Date of Birth: 06-Sep-1946  Date of referral:  09/14/15               Reason for consult:                   Permission sought to share information with:  Family Supports, Customer service manager, Case Optician, dispensing granted to share information::  Yes, Verbal Permission Granted  Name::      (sister, Lelon Frohlich)  Agency::   ([place] 1st choice)  Relationship::     Contact Information:     Housing/Transportation Living arrangements for the past 2 months:  [place] of Information:  Patient, Other (Comment Required) (sister, Lelon Frohlich) Patient Interpreter Needed:  None Criminal Activity/Legal Involvement Pertinent to Current Situation/Hospitalization:  No - Comment as needed Significant Relationships:  Siblings (sister and brother) Lives with:  Siblings (sister, Lelon Frohlich) Do you feel safe going back to the place where you live?  No (sister cannot care for patient at home) Need for family participation in patient care:     Care giving concerns:  Sister states she cannot care for patient at home. Patient would need to be independent with ADLs in order to safely return to home setting.   Social Worker assessment / plan:  CSW met with patient at bedside who was being taken to xray.  Sister was at bedside.  CSW received verbal permission to speak with sister, Lelon Frohlich.  Lelon Frohlich states that patient is from home with her x3 years. Sister is agreeable to SNF recommendation and states patient was at [facility] last summer after and admission to [facility] and patient and sister was satisfied with PT and the stay there at that time.  Sister states her mother was also a resident at [facility] and was satisfied.  CSW to speak with patient to determine choice.  Sister states there are no stairs inside the home and only two steps outside the home.  Sister states patient was admitted to [facility] status post  SOB.  Sister states the patient's bedroom is on the "back side of the house" and the patient would be exhausted and winded after walking with walker to kitchen.  Sister states this is worsening since Thanksgiving.  Patient is not medically cleared at this time.  Employment status:  Retired Passenger transport manager) PT Recommendations:  [place] / Referral to community resources:  [place]  Patient/Family's Response to care:  Sister is agreeable to SNF.  Patient/Family's Understanding of and Emotional Response to Diagnosis, Current Treatment, and Prognosis:  Patient- unable to assess.  Sister is realistic regarding level of care needed at time of discharge.  Emotional Assessment Appearance:  Appears stated age Attitude/Demeanor/Rapport:    Affect (typically observed):  Accepting Orientation:  Oriented to Self, Oriented to  Time, Oriented to Place, Oriented to Situation Alcohol / Substance use:  Not Applicable Psych involvement (Current and /or in the community):  No (Comment)  Discharge Needs  Concerns to be addressed:  No discharge needs identified Readmission within the last 30 days:  No Current discharge risk:  None Barriers to Discharge:  Continued Medical Work up   Dulcy Fanny, LCSW 09/14/2015, 9:34 AM

## 2015-09-14 NOTE — Progress Notes (Signed)
[facility] PROGRESS NOTE  Caitlin Hampton FVC:[id number] DOB: Apr 06, 1946 DOA: 09/11/2015 PCP: [name], MD  Brief interval history Patient is an 69 year old female past oral history of COPD on chronic 3 L oxygen, atrial fibrillation on chronic anticoagulation and CAD who had been having for the past 1 week increased dyspnea on exertion and nonproductive cough, but came to the emergency room on 12/19 after she was headed to see her PCP, but fell hitting her left leg which led to a large hematoma. In the emergency room, patient was found to have mild cytosis, lactic acidosis and acute respiratory failure requiring 5-6 L of oxygen. Was suspected the patient had a pneumonia causing sepsis. Chest x-ray was not very revealing, likely from dehydration. Patient started on IV antibiotics and treated with sepsis protocol with aggressive fluid resuscitation. UA came back concerning urinary tract infection. Urine cultures negative.   Patient of the last few days has slowly continued to improve. Yesterday 09/13/2015 patient, needing 4-5 liters of oxygen nasal cannula she herself feels good. No complaints other than some mild right leg pain.   On 09/14/2015 patient noted to be in acute on chronic respiratory distress on a Venturi mask with sats in the high 80s to low 90s. Repeat chest x-ray done consistent with volume overload. Patient also noted to have a significantly enlarged hematoma on the right lower extremity. Patient is being diuresed and orthopedics, Dr Ninfa Linden was consulted for evacuation of right lower extremity hematoma.   Assessment/Plan: #1 acute on chronic respiratory failure Likely multifactorial largely in part secondary to acute on chronic diastolic heart failure in the setting of probable COPD exacerbation and probable community-acquired pneumonia. Patient currently on the Venturi mask with sats in the high 80s to low 90s. Patient diuresed well and -1.874 L over the past 24 hours.  Chest x-ray consistent with volume overload and bilateral pleural effusions. Check a TSH. Check a BNP. Cycle cardiac enzymes every 6 hours 3. Check a 2-D echo. Continue Lasix 40 mg IV every 12 hours. Continue steroid taper, nebulizers, IV Rocephin. Will add Brovana.  #2 sepsis On admission it was felt patient met criteria for sepsis given lactic acidosis, tachypnea, tachycardia, leukocytosis and felt to likely be secondary to a pulmonary source and possible urinary tract infection. Patient is currently afebrile. Patient's pulmonary status complicated by volume overload. Continue oral steroid taper, nebulizer treatments, IV Rocephin.  #3 probable community-acquired pneumonia Per admission. Patient currently afebrile. Patient also with a leukocytosis of also on steroids. Chest x-ray done this morning was consistent with volume overload. Patient was on azithromycin and currently now on IV Rocephin. Possibly transitioning to oral Augmentin tomorrow.  #4 acute on chronic diastolic heart failure Patient currently volume overload. Cycle cardiac enzymes every 6 hours 3. Check a TSH. Check a BNP. Check a 2-D echo. Continue IV Lasix 40 mg every 12 hours, Lipitor, Cardizem.  #5 bacteria in the urine On admission was some concerns that patient may have a urinary tract infection. Urine cultures with multiple bacterial morphotypes. Follow.  #6 leukocytosis Likely secondary to problems #2, 3 and steroids. Urine cultures negative. Blood cultures pending. Patient on empiric IV Rocephin. Patient on a steroid taper. Follow.  #7 hematoma of the right lower extremity Patient with significant swelling on right lower extremity hematoma. Patient also on chronic anticoagulation. Orthopedics has been consulted and patient was seen in consultation by Dr. Ninfa Linden who was able to use a #10 blade at the bedside to remove necrotic skin and evacuated a  very large hematoma and wound packed with wet-to-dry dressing. Orthopedics  considering wound VAC. Per orthopedics okay to continue patient's anticoagulation.  #8 hypothyroidism Check a TSH. Continue home dose Synthroid.  #9 chronic atrial fibrillation Continue Cardizem for rate control. Xarelto for anticoagulation.   #10 prophylaxis PPI for GI prophylaxis. Xarelto for DVT prophylaxis.  Code Status: Full Family Communication: Updated patient. No family at bedside. Disposition Plan: Remain in SDU. Home when respiratory status improved, and back to baseline.   Consultants:  Orthopedics: Dr. Ninfa Linden 09/14/2015    Procedures:  Chest x-ray 09/11/2015, 09/14/2015  X-ray of the tib-fib 09/11/2015  Antibiotics:  IV Rocephin 09/11/2015  IV Azithromycin 09/11/2015>>>09/13/2015    HPI/Subjective: Patient states SOB on minimal exertion. No CP.   Objective: Filed Vitals:   09/14/15 0759 09/14/15 0800  BP:  149/99  Pulse: 58 65  Temp:    Resp: 20 21    Intake/Output Summary (Last 24 hours) at 09/14/15 1125 Last data filed at 09/14/15 0800  Gross per 24 hour  Intake    446 ml  Output   1650 ml  Net  -1204 ml   Filed Weights   09/11/15 2000 09/13/15 0500  Weight: 111 kg (244 lb 11.4 oz) 114 kg (251 lb 5.2 oz)    Exam:   General:  On venturi mask. Sitting upright.  Cardiovascular: Irregularly irregular  Respiratory: Decvreased BS in bases. Scattered crackles. minimal wheezing. No rhonchi  Abdomen: Soft/NT/ND/+BS  Musculoskeletal: nO C/C/. 1 + Ble edema. RLE in tibial region with large 10 x 10 cm hematoma with some weeping.  Data Reviewed: Basic Metabolic Panel:  Recent Labs Lab 09/11/15 1527 09/13/15 0400 09/14/15 0228  NA 141 140 140  K 3.4* 3.5 3.5  CL 105 105 99*  CO2 _0 GLUCOSE 108* 144* 129*  BUN 12 18 26*  CREATININE 0.94 1.07* 1.11*  CALCIUM 8.6* 8.6* 8.4*   Liver Function Tests: No results for input(s): AST, ALT, ALKPHOS, BILITOT, PROT, ALBUMIN in the last 168 hours. No results for input(s): LIPASE,  AMYLASE in the last 168 hours. No results for input(s): AMMONIA in the last 168 hours. CBC:  Recent Labs Lab 09/11/15 1527 09/11/15 1944 09/12/15 0322 09/13/15 0400  WBC 11.5* 10.5 10.9* 16.6*  NEUTROABS 9.9* 10.1*  --   --   HGB 8.9* 8.4* 8.3* 8.7*  HCT 32.0* 30.8* 30.6* 31.3*  MCV 80.8 80.6 81.4 82.4  PLT 335 272 304 287   Cardiac Enzymes:  Recent Labs Lab 09/11/15 1527  TROPONINI 0.04*   BNP (last 3 results)  Recent Labs  12/11/14 1958 09/11/15 1527  BNP 1060.6* 737.3*    ProBNP (last 3 results) No results for input(s): PROBNP in the last 8760 hours.  CBG: No results for input(s): GLUCAP in the last 168 hours.  Recent Results (from the past 240 hour(s))  Blood Culture (routine x 2)     Status: None (Preliminary result)   Collection Time: 09/11/15  3:27 PM  Result Value Ref Range Status   Specimen Description BLOOD RIGHT FOREARM  Final   Special Requests BOTTLES DRAWN AEROBIC AND ANAEROBIC 5CC  Final   Culture NO GROWTH 2 DAYS  Final   Report Status PENDING  Incomplete  Blood Culture (routine x 2)     Status: None (Preliminary result)   Collection Time: 09/11/15  7:44 PM  Result Value Ref Range Status   Specimen Description BLOOD RIGHT HAND  Final   Special Requests BOTTLES DRAWN AEROBIC  ONLY 6CC  Final   Culture NO GROWTH 2 DAYS  Final   Report Status PENDING  Incomplete  MRSA PCR Screening     Status: None   Collection Time: 09/11/15  8:23 PM  Result Value Ref Range Status   MRSA by PCR NEGATIVE NEGATIVE Final    Comment:        The GeneXpert MRSA Assay (FDA approved for NASAL specimens only), is one component of a comprehensive MRSA colonization surveillance program. It is not intended to diagnose MRSA infection nor to guide or monitor treatment for MRSA infections.   Urine culture     Status: None   Collection Time: 09/11/15  9:48 PM  Result Value Ref Range Status   Specimen Description URINE, CLEAN CATCH  Final   Special Requests NONE   Final   Culture MULTIPLE SPECIES PRESENT, SUGGEST RECOLLECTION  Final   Report Status 09/13/2015 FINAL  Final     Studies: Dg Chest 2 View  09/14/2015  CLINICAL DATA:  Shortness of Breath EXAM: CHEST  2 VIEW COMPARISON:  September 11, 2015 FINDINGS: There is patchy atelectasis in each lung base. There is mild interstitial edema with cardiomegaly and borderline pulmonary venous hypertension. No adenopathy. There is atherosclerotic calcification in the aorta. There is degenerative change in the thoracic spine. There is a small right pleural effusion. IMPRESSION: Findings indicative of a degree of congestive heart failure. Electronically Signed   By: William  Woodruff III M.D.   On: 09/14/2015 09:59    Scheduled Meds: . antiseptic oral rinse  7 mL Mouth Rinse q12n4p  . atorvastatin  10 mg Oral QHS  . budesonide  0.25 mg Nebulization BID  . cefTRIAXone (ROCEPHIN)  IV  1 g Intravenous Q24H  . chlorhexidine  15 mL Mouth Rinse BID  . cloNIDine  0.1 mg Oral BID  . diltiazem  120 mg Oral BID  . furosemide  40 mg Intravenous Q12H  . ipratropium  0.5 mg Nebulization Once  . ipratropium-albuterol  3 mL Nebulization Once  . levalbuterol  0.63 mg Nebulization Q6H  . levothyroxine  100 mcg Oral QAC breakfast  . predniSONE  40 mg Oral Q breakfast  . rivaroxaban  20 mg Oral Q breakfast  . sodium chloride  3 mL Intravenous Q12H   Continuous Infusions: . sodium chloride 10 mL/hr at 09/12/15 1900    Principal Problem:   Acute and chronic respiratory failure with hypoxia (HCC) Active Problems:   Hypertension   Hypothyroidism   Acute on chronic diastolic CHF (congestive heart failure) (HCC)   HX: anticoagulation   Morbid obesity (HCC)   COPD exacerbation (HCC)   Sepsis (HCC)   Chronic a-fib (HCC)   UTI (urinary tract infection)   Hematoma of right lower extremity   CAP (community acquired pneumonia)    Time spent: 40 minutes    THOMPSON,DANIEL M.D. [facility] Pager [phone].  If 7PM-7AM, please contact night-coverage at [email or web address], password [email or web address] 09/14/2015, 11:25 AM  LOS: 3 days             

## 2015-09-14 NOTE — Progress Notes (Signed)
Patient refusing foley catheter at this time. 40 mg of PO lasix is being given every twelve hours. Patient is not showing signs of retention at this time. Will continue to monitor.

## 2015-09-14 NOTE — Care Management Important Message (Signed)
Important Message  Patient Details  Name: JULIANNAH BARDO MRN: [id number] Date of Birth: [birth year]   Medicare Important Message Given:  Yes    Barb Merino Lorriane Dehart 09/14/2015, 1:46 PM

## 2015-09-14 NOTE — Clinical Social Work Placement (Signed)
   CLINICAL SOCIAL WORK PLACEMENT  NOTE  Date:  09/14/2015  Patient Details  Name: Caitlin Hampton MRN: [id number] Date of Birth: 18-Oct-1945  Clinical Social Work is seeking post-discharge placement for this patient at the [place] level of care (*CSW will initial, date and re-position this form in  chart as items are completed):  Yes   Patient/family provided with [place] Work Department's list of facilities offering this level of care within the geographic area requested by the patient (or if unable, by the patient's family).  Yes   Patient/family informed of their freedom to choose among providers that offer the needed level of care, that participate in Medicare, Medicaid or managed care program needed by the patient, have an available bed and are willing to accept the patient.  Yes   Patient/family informed of [place]'s ownership interest in [facility] and [facility], as well as of the fact that they are under no obligation to receive care at these facilities.  PASRR submitted to EDS on 09/14/15     PASRR number received on 09/14/15     Existing PASRR number confirmed on       FL2 transmitted to all facilities in geographic area requested by pt/family on 09/14/15     FL2 transmitted to all facilities within larger geographic area on       Patient informed that his/her managed care company has contracts with or will negotiate with certain facilities, including the following:            Patient/family informed of bed offers received.  Patient chooses bed at       Physician recommends and patient chooses bed at      Patient to be transferred to   on  .  Patient to be transferred to facility by       Patient family notified on   of transfer.  Name of family member notified:        PHYSICIAN       Additional Comment:    _______________________________________________ Dulcy Fanny, LCSW 09/14/2015, 11:59 AM

## 2015-09-14 NOTE — Clinical Social Work Note (Signed)
CSW submitted [employer] authorization for SNF for [place].  Unsure of projected discharge date.  Authorization is good through Sunday 09/17/2015.  [employer] is open on Monday if authorization expires Sunday 12/25 CSW can obtain insurance authorization on Monday.  Insurance auth: [id number] RVC next review date 12/27  Nonnie Done, [place] [phone]  [place] [id number]  Licensed Clinical Social Worker

## 2015-09-14 NOTE — Progress Notes (Signed)
Echocardiogram 2D Echocardiogram has been performed.  Tresa Res 09/14/2015, 1:30 PM

## 2015-09-15 LAB — BASIC METABOLIC PANEL
ANION GAP: 11 (ref 5–15)
BUN: 31 mg/dL — AB (ref 6–20)
CALCIUM: 8.1 mg/dL — AB (ref 8.9–10.3)
CHLORIDE: 99 mmol/L — AB (ref 101–111)
CO2: 32 mmol/L (ref 22–32)
CREATININE: 1.21 mg/dL — AB (ref 0.44–1.00)
GFR, EST AFRICAN AMERICAN: 52 mL/min — AB (ref >60)
GFR, EST NON AFRICAN AMERICAN: 45 mL/min — AB (ref >60)
GLUCOSE: 107 mg/dL — AB (ref 65–99)
POTASSIUM: 3.8 mmol/L (ref 3.5–5.1)
Sodium: 142 mmol/L (ref 135–145)

## 2015-09-15 LAB — CBC
HCT: 23.2 % — ABNORMAL LOW (ref 36.0–46.0)
HCT: 28.1 % — ABNORMAL LOW (ref 36.0–46.0)
HEMATOCRIT: 21.9 % — AB (ref 36.0–46.0)
HEMOGLOBIN: 6.2 g/dL — AB (ref 12.0–15.0)
HEMOGLOBIN: 6.6 g/dL — AB (ref 12.0–15.0)
Hemoglobin: 8.4 g/dL — ABNORMAL LOW (ref 12.0–15.0)
MCH: 22.5 pg — ABNORMAL LOW (ref 26.0–34.0)
MCH: 22.9 pg — AB (ref 26.0–34.0)
MCH: 23.9 pg — ABNORMAL LOW (ref 26.0–34.0)
MCHC: 28.3 g/dL — ABNORMAL LOW (ref 30.0–36.0)
MCHC: 28.4 g/dL — ABNORMAL LOW (ref 30.0–36.0)
MCHC: 29.9 g/dL — ABNORMAL LOW (ref 30.0–36.0)
MCV: 79.6 fL (ref 78.0–100.0)
MCV: 80.6 fL (ref 78.0–100.0)
MCV: 80.1 fL (ref 78.0–100.0)
PLATELETS: 222 10*3/uL (ref 150–400)
Platelets: 227 10*3/uL (ref 150–400)
Platelets: 203 10*3/uL (ref 150–400)
RBC: 2.75 MIL/uL — AB (ref 3.87–5.11)
RBC: 2.88 MIL/uL — AB (ref 3.87–5.11)
RBC: 3.51 MIL/uL — AB (ref 3.87–5.11)
RDW: 19.5 % — ABNORMAL HIGH (ref 11.5–15.5)
RDW: 19.5 % — ABNORMAL HIGH (ref 11.5–15.5)
RDW: 17.8 % — AB (ref 11.5–15.5)
WBC: 15.7 10*3/uL — AB (ref 4.0–10.5)
WBC: 16.9 10*3/uL — ABNORMAL HIGH (ref 4.0–10.5)
WBC: 17 10*3/uL — AB (ref 4.0–10.5)

## 2015-09-15 LAB — ABO/RH: ABO/RH(D): O NEG

## 2015-09-15 LAB — PREPARE RBC (CROSSMATCH)

## 2015-09-15 LAB — TROPONIN I: Troponin I: 0.04 ng/mL — ABNORMAL HIGH (ref <0.031)

## 2015-09-15 LAB — PROCALCITONIN: Procalcitonin: <0.1 ng/mL

## 2015-09-15 MED ORDER — FUROSEMIDE 10 MG/ML IJ SOLN
40.0000 mg | Freq: Every day | INTRAMUSCULAR | Status: DC
  Administered 2015-09-15 – 2015-09-16 (×2): 40 mg via INTRAVENOUS
  Filled 2015-09-15 (×2): qty 4

## 2015-09-15 MED ORDER — PREDNISONE 20 MG PO TABS
20.0000 mg | ORAL_TABLET | Freq: Every day | ORAL | Status: DC
  Administered 2015-09-16 – 2015-09-18 (×3): 20 mg via ORAL
  Filled 2015-09-15 (×3): qty 1

## 2015-09-15 MED ORDER — SODIUM CHLORIDE 0.9 % IV SOLN: Freq: Once | INTRAVENOUS | Status: DC

## 2015-09-15 MED ORDER — CETYLPYRIDINIUM CHLORIDE 0.05 % MT LIQD
7.0000 mL | Freq: Two times a day (BID) | OROMUCOSAL | Status: DC
  Administered 2015-09-15 – 2015-10-03 (×32): 7 mL via OROMUCOSAL

## 2015-09-15 MED ORDER — IPRATROPIUM BROMIDE 0.02 % IN SOLN
0.5000 mg | Freq: Four times a day (QID) | RESPIRATORY_TRACT | Status: DC
  Administered 2015-09-15 – 2015-09-25 (×41): 0.5 mg via RESPIRATORY_TRACT
  Filled 2015-09-15 (×41): qty 2.5

## 2015-09-15 MED ORDER — FUROSEMIDE 10 MG/ML IJ SOLN
20.0000 mg | Freq: Once | INTRAMUSCULAR | Status: AC
  Administered 2015-09-15: 20 mg via INTRAVENOUS
  Filled 2015-09-15: qty 2

## 2015-09-15 MED ORDER — LEVALBUTEROL HCL 0.63 MG/3ML IN NEBU
0.6300 mg | INHALATION_SOLUTION | Freq: Four times a day (QID) | RESPIRATORY_TRACT | Status: DC
  Administered 2015-09-15 – 2015-09-25 (×40): 0.63 mg via RESPIRATORY_TRACT
  Filled 2015-09-15 (×41): qty 3

## 2015-09-15 MED ORDER — FUROSEMIDE 10 MG/ML IJ SOLN: 60.0000 mg | Freq: Two times a day (BID) | INTRAMUSCULAR | Status: DC

## 2015-09-15 MED ORDER — ACETAMINOPHEN 325 MG PO TABS
650.0000 mg | ORAL_TABLET | Freq: Once | ORAL | Status: AC
  Administered 2015-09-15: 650 mg via ORAL
  Filled 2015-09-15: qty 2

## 2015-09-15 NOTE — Progress Notes (Signed)
PT Cancellation Note  Patient Details Name: Caitlin Hampton MRN: [id number] DOB: [birth year]   Cancelled Treatment:    Reason Eval/Treat Not Completed: Medical issues which prohibited therapy (Right LE bleeding per nurse.  HOLD PT today per nurse.) Will return on Monday to continue PT as pt tolerates.  Thanks.   Irwin Brakeman F 09/15/2015, 10:11 AM  [facility] [phone] (pager)

## 2015-09-15 NOTE — Progress Notes (Signed)
[place] TEAM 1 - Stepdown/ICU TEAM PROGRESS NOTE  Caitlin Hampton WCH:[id number] DOB: Sep 08, 1946 DOA: 09/11/2015 PCP: [name], MD  Admit HPI / Brief Narrative: 69 year old female past w/ a hx of COPD on chronic 3 L oxygen, atrial fibrillation on chronic anticoagulation, and CAD who had been having one week of increased dyspnea on exertion and nonproductive cough, but came to the emergency room on 12/19 after she was headed to see her PCP, but fell hitting her R leg which led to a large shin hematoma. In the emergency room, patient was found to have acute respiratory failure requiring 5-6 L of oxygen. Chest x-ray was not revealing. Patient started on IV antibiotics and treated with sepsis protocol with aggressive fluid resuscitation. UA came back concerning for a urinary tract infection.   On 09/14/2015 patient noted to be in acute on chronic respiratory distress on a Venturi mask with sats in the high 80s to low 90s. Repeat chest x-ray consistent with volume overload. Patient also noted to have a significantly enlarged hematoma on the right lower extremity. Dr Ninfa Linden was consulted for evacuation of right lower extremity hematoma.  HPI/Subjective: The pt states she feels better overall.  She has noted a signif amount of bleeding from her R leg wound over the last 12 hours.  She denies cp, n/v, or abom pain.  Her SOB has improved, but is not yet resolved per her account.    Assessment/Plan:  acute on chronic hypoxic respiratory failure - multifactorial  acute on chronic diastolic heart failure in the setting of probable COPD exacerbation and ?community-acquired pneumonia - still requiring twice of home O2 regimen to keep sats barely at 90 - diurese as BP allows - wean O2 as able   ?sepsis v/s SIRS  met criteria for probable sepsis given tachypnea, tachycardia, leukocytosis and UTI +/- CAP  ?community-acquired pneumonia procalcitonin not suggestive of an active pulmonary infection - f/u  CXRs have not shown an infiltrate - stop abx after 5th dose today and follow    acute on chronic Right Heart Failure  EF 55-60% on TTE 12/22 - RV failure and sever Pulm HTN appreciated - baseline weight of late as low as 110 kg (12/2) - currently 112kg - net positive ~3L this admit - diurese as BP allows   bacteria in the urine Urine cultures have proven to not be helpful - UA + but c/w an unclean sample   hematoma of the right lower extremity Dr. Ninfa Linden was able to use a #10 blade at the bedside to remove necrotic skin and evacuate a very large hematoma - wound packed with wet-to-dry dressing - considering wound VAC - per Orthopedics okay to continue anticoagulation, but w/ active bleeding will hold today   anemia due to acute blood loss Transfuse as required to keep Hgb 7.0 or >  acute renal insufficiency  crt has been climbing - likely prerenal - follow trend w/ transfusion   hypothyroidism Continue home dose Synthroid - TSH 1.984  chronic atrial fibrillation Continue Cardizem - resume Xarelto when bleeding ceases   Obesity - Body mass index is 42.36 kg/(m^2).  Code Status: FULL Family Communication: spoke w/ pt and sister at bedside  Disposition Plan: SDU   Consultants: Orthopedics - Dr. Ninfa Linden  Procedures: 12/22 - bedside debridement and evacuation of R LE hematoma  12/22 - TTE as noted above   Antibiotics: Rocephin 12/19 > 12/23 Azithromycin 12/19 > 12/20  DVT prophylaxis: SCDs only today   Objective: Blood pressure  103/69, pulse 89, temperature 98.2 F (36.8 C), temperature source Oral, resp. rate 19, height '5\' 4"'$  (1.626 m), weight 112 kg (246 lb 14.6 oz), SpO2 90 %.  Intake/Output Summary (Last 24 hours) at 09/15/15 0858 Last data filed at 09/15/15 0819  Gross per 24 hour  Intake    510 ml  Output    150 ml  Net    360 ml   Exam: General: No acute respiratory distress at rest  Lungs: Clear to auscultation bilaterally without wheezes or  crackles Cardiovascular: Regular rate without murmur gallop or rub normal S1 and S2 - distant HS  Abdomen: Nontender, nondistended, soft, bowel sounds positive, no rebound, no ascites, no appreciable mass Extremities: No significant cyanosis, or clubbing, R LE dressed and currently dry, 1+ edema B LE   Data Reviewed: Basic Metabolic Panel:  Recent Labs Lab 09/11/15 1527 09/13/15 0400 09/14/15 0228 09/15/15 0347  NA 141 140 140 142  K 3.4* 3.5 3.5 3.8  CL 105 105 99* 99*  CO2 '26 24 30 '$ 32  GLUCOSE 108* 144* 129* 107*  BUN 12 18 26* 31*  CREATININE 0.94 1.07* 1.11* 1.21*  CALCIUM 8.6* 8.6* 8.4* 8.1*    CBC:  Recent Labs Lab 09/11/15 1527 09/11/15 1944 09/12/15 0322 09/13/15 0400 09/15/15 0347 09/15/15 0600  WBC 11.5* 10.5 10.9* 16.6* 15.7* 16.9*  NEUTROABS 9.9* 10.1*  --   --   --   --   HGB 8.9* 8.4* 8.3* 8.7* 6.2* 6.6*  HCT 32.0* 30.8* 30.6* 31.3* 21.9* 23.2*  MCV 80.8 80.6 81.4 82.4 79.6 80.6  PLT 335 272 304 287 227 203    Liver Function Tests: No results for input(s): AST, ALT, ALKPHOS, BILITOT, PROT, ALBUMIN in the last 168 hours. No results for input(s): LIPASE, AMYLASE in the last 168 hours. No results for input(s): AMMONIA in the last 168 hours.  Coags:  Recent Labs Lab 09/11/15 1527  INR 2.11*    Recent Labs Lab 09/11/15 2120  APTT 27    Cardiac Enzymes:  Recent Labs Lab 09/11/15 1527 09/14/15 1320 09/14/15 1753 09/14/15 2332  TROPONINI 0.04* 0.05* 0.04* 0.04*    Recent Results (from the past 240 hour(s))  Blood Culture (routine x 2)     Status: None (Preliminary result)   Collection Time: 09/11/15  3:27 PM  Result Value Ref Range Status   Specimen Description BLOOD RIGHT FOREARM  Final   Special Requests BOTTLES DRAWN AEROBIC AND ANAEROBIC 5CC  Final   Culture NO GROWTH 3 DAYS  Final   Report Status PENDING  Incomplete  Blood Culture (routine x 2)     Status: None (Preliminary result)   Collection Time: 09/11/15  7:44 PM   Result Value Ref Range Status   Specimen Description BLOOD RIGHT HAND  Final   Special Requests BOTTLES DRAWN AEROBIC ONLY [place]  Final   Culture NO GROWTH 3 DAYS  Final   Report Status PENDING  Incomplete  MRSA PCR Screening     Status: None   Collection Time: 09/11/15  8:23 PM  Result Value Ref Range Status   MRSA by PCR NEGATIVE NEGATIVE Final    Comment:        The GeneXpert MRSA Assay (FDA approved for NASAL specimens only), is one component of a comprehensive MRSA colonization surveillance program. It is not intended to diagnose MRSA infection nor to guide or monitor treatment for MRSA infections.   Urine culture     Status: None   Collection  Time: 09/11/15  9:48 PM  Result Value Ref Range Status   Specimen Description URINE, CLEAN CATCH  Final   Special Requests NONE  Final   Culture MULTIPLE SPECIES PRESENT, SUGGEST RECOLLECTION  Final   Report Status 09/13/2015 FINAL  Final     Studies:   Recent x-ray studies have been reviewed in detail by the Attending Physician  Scheduled Meds:  Scheduled Meds: . sodium chloride   Intravenous Once  . antiseptic oral rinse  7 mL Mouth Rinse q12n4p  . arformoterol  15 mcg Nebulization BID  . atorvastatin  10 mg Oral QHS  . budesonide  0.25 mg Nebulization BID  . cefTRIAXone (ROCEPHIN)  IV  1 g Intravenous Q24H  . chlorhexidine  15 mL Mouth Rinse BID  . cloNIDine  0.1 mg Oral BID  . diltiazem  120 mg Oral BID  . furosemide  20 mg Intravenous Once  . furosemide  40 mg Intravenous Q12H  . ipratropium  0.5 mg Nebulization Once  . ipratropium  0.5 mg Nebulization Q6H  . ipratropium-albuterol  3 mL Nebulization Once  . levalbuterol  0.63 mg Nebulization Q6H  . levothyroxine  100 mcg Oral QAC breakfast  . predniSONE  40 mg Oral Q breakfast  . rivaroxaban  20 mg Oral Q breakfast  . sodium chloride  3 mL Intravenous Q12H    Time spent on care of this patient: 35 mins   MCCLUNG,JEFFREY T , MD   [facility] Office   [phone] Pager - Text Page per Shea Evans as per below:  On-Call/Text Page:      [email or web address]      password [email or web address]  If 7PM-7AM, please contact night-coverage [email or web address] Password [email or web address] 09/15/2015, 8:58 AM   LOS: 4 days

## 2015-09-16 LAB — TYPE AND SCREEN
ABO/RH(D): O NEG
ANTIBODY SCREEN: NEGATIVE
UNIT DIVISION: 0
Unit division: 0

## 2015-09-16 LAB — COMPREHENSIVE METABOLIC PANEL
ALT: 24 U/L (ref 14–54)
ANION GAP: 7 (ref 5–15)
AST: 24 U/L (ref 15–41)
Albumin: 2.7 g/dL — ABNORMAL LOW (ref 3.5–5.0)
Alkaline Phosphatase: 103 U/L (ref 38–126)
BUN: 29 mg/dL — AB (ref 6–20)
CALCIUM: 8 mg/dL — AB (ref 8.9–10.3)
CHLORIDE: 98 mmol/L — AB (ref 101–111)
CO2: 37 mmol/L — AB (ref 22–32)
CREATININE: 1.03 mg/dL — AB (ref 0.44–1.00)
GFR calc non Af Amer: 54 mL/min — ABNORMAL LOW (ref >60)
GLUCOSE: 112 mg/dL — AB (ref 65–99)
POTASSIUM: 5 mmol/L (ref 3.5–5.1)
SODIUM: 142 mmol/L (ref 135–145)
Total Bilirubin: 0.9 mg/dL (ref 0.3–1.2)
Total Protein: 5.5 g/dL — ABNORMAL LOW (ref 6.5–8.1)

## 2015-09-16 LAB — CBC
HCT: 26.7 % — ABNORMAL LOW (ref 36.0–46.0)
Hemoglobin: 8.1 g/dL — ABNORMAL LOW (ref 12.0–15.0)
MCH: 24.3 pg — AB (ref 26.0–34.0)
MCHC: 30.3 g/dL (ref 30.0–36.0)
MCV: 80.2 fL (ref 78.0–100.0)
PLATELETS: 190 10*3/uL (ref 150–400)
RBC: 3.33 MIL/uL — AB (ref 3.87–5.11)
RDW: 17.8 % — ABNORMAL HIGH (ref 11.5–15.5)
WBC: 17.4 10*3/uL — ABNORMAL HIGH (ref 4.0–10.5)

## 2015-09-16 LAB — CULTURE, BLOOD (ROUTINE X 2)
Culture: NO GROWTH
Culture: NO GROWTH

## 2015-09-16 MED ORDER — POTASSIUM CHLORIDE CRYS ER 20 MEQ PO TBCR
20.0000 meq | EXTENDED_RELEASE_TABLET | Freq: Two times a day (BID) | ORAL | Status: DC
  Administered 2015-09-16 – 2015-09-19 (×7): 20 meq via ORAL
  Filled 2015-09-16 (×7): qty 1

## 2015-09-16 MED ORDER — FUROSEMIDE 10 MG/ML IJ SOLN
80.0000 mg | Freq: Two times a day (BID) | INTRAMUSCULAR | Status: DC
  Administered 2015-09-16: 80 mg via INTRAVENOUS
  Filled 2015-09-16: qty 8

## 2015-09-16 NOTE — Progress Notes (Signed)
Patient ID: Caitlin Hampton, female   DOB: 19-Aug-1946, 69 y.o.   MRN: [id number] I changed her right leg wound dressing this am.  I did find an actively bleeding vein and had to use an electrocaudery pen at the bedside to get it to stop.  I placed a new wet-to-dry dressing.  Due to the size of her wound, she will need a formal I&D in the OR.  I plan to do this on Monday 12/26.  Will need to hold anticoagulation after today.  I spoke to her and her sister about this as well.

## 2015-09-16 NOTE — Progress Notes (Signed)
[place] TEAM 1 - Stepdown/ICU TEAM  Caitlin Hampton OIN:[id number] DOB: March 21, 1946 DOA: 09/11/2015 PCP: [name], MD  Admit HPI / Brief Narrative: 69 year old female w/ a hx of COPD on chronic 3 L oxygen, atrial fibrillation on chronic anticoagulation, and CAD who had been having one week of increased dyspnea on exertion and nonproductive cough, but came to the emergency room on 12/19 after she fell hitting her R leg which led to a large shin hematoma. In the emergency room, patient was found to have acute respiratory failure requiring 5-6 L of oxygen. Chest x-ray was not revealing. Patient started on IV antibiotics and treated with sepsis protocol with aggressive fluid resuscitation. UA came back concerning for a urinary tract infection.   On 09/14/2015 patient noted to be in acute on chronic respiratory distress on a Venturi mask with sats in the high 80s to low 90s. Repeat chest x-ray consistent with volume overload. Patient also noted to have a significantly enlarged hematoma on the right lower extremity. Dr Ninfa Linden was consulted for evacuation of right lower extremity hematoma.  HPI/Subjective: Pt states she is feeling better overall.  No current sob at rest, cp, n/v, or abdom pain.  Some intermittent sharp pains in her R leg, but not constant or particularly troublesome.    Assessment/Plan:  acute on chronic hypoxic respiratory failure - multifactorial  acute on chronic diastolic heart failure in the setting of probable COPD exacerbation and ?community-acquired pneumonia - still requiring 2xhome O2 regimen to keep sats barely at 90 - diurese - wean O2 as able   ?sepsis v/s SIRS  met criteria for probable sepsis given tachypnea, tachycardia, leukocytosis and UTI +/- CAP  ?community-acquired pneumonia procalcitonin not suggestive of an active pulmonary infection - f/u CXRs have not shown an infiltrate - stopped abx after 5th dose - follow    acute on chronic Right Heart Failure    EF 55-60% on TTE 12/22 + RV failure and sever Pulm HTN appreciated - baseline weight of late as low as 110 kg (12/2) - currently 110.5kg - net positive ~3.4L this admit - increase diuretic and institute fluid restriction    bacteria in the urine Urine cultures have proven to not be helpful - UA + but c/w an unclean sample - has completed a course of abx tx - no sx presently   hematoma of the right lower extremity Dr. Ninfa Linden was able to use a #10 blade at the bedside to remove necrotic skin and evacuate a very large hematoma - in f/u Ortho has decided OR I&D will be required - anticoag stopped 12/23 due to ongoing bleeding   anemia due to acute blood loss Transfuse as required to keep Hgb 7.0 or > - stable for now   acute renal insufficiency  crt had been climbing, but appears to have stabilized today - likely prerenal - follow trend  hypothyroidism Continue home dose Synthroid - TSH 1.984  chronic atrial fibrillation Continue Cardizem - resume Xarelto when bleeding ceases - rate well controleld   Obesity - Body mass index is 41.79 kg/(m^2).  Code Status: FULL Family Communication: spoke w/ pt and sister at bedside again today  Disposition Plan: SDU   Consultants: Orthopedics - Dr. Ninfa Linden  Procedures: 12/22 - bedside debridement and evacuation of R LE hematoma  12/22 - TTE as noted above   Antibiotics: Rocephin 12/19 > 12/23 Azithromycin 12/19 > 12/20  DVT prophylaxis: SCD L LE   Objective: Blood pressure 107/58, pulse 84, temperature 97.6  F (36.4 C), temperature source Oral, resp. rate 15, height '5\' 4"'$  (1.626 m), weight 110.5 kg (243 lb 9.7 oz), SpO2 91 %.  Intake/Output Summary (Last 24 hours) at 09/16/15 1153 Last data filed at 09/16/15 1100  Gross per 24 hour  Intake    620 ml  Output    450 ml  Net    170 ml   Exam: General: No acute respiratory distress at rest - alert  Lungs: fine basilar crackles - no wheeze  Cardiovascular: Regular rate without  murmur gallop or rub - distant HS  Abdomen: Nontender, nondistended, soft, bowel sounds positive, no rebound, no ascites, no appreciable mass Extremities: No significant cyanosis, or clubbing, R LE dressed and currently dry, 1+ edema B LE persists    Data Reviewed: Basic Metabolic Panel:  Recent Labs Lab 09/11/15 1527 09/13/15 0400 09/14/15 0228 09/15/15 0347 09/16/15 0452  NA 141 140 140 142 142  K 3.4* 3.5 3.5 3.8 5.0  CL 105 105 99* 99* 98*  CO2 '26 24 30 '$ 32 37*  GLUCOSE 108* 144* 129* 107* 112*  BUN 12 18 26* 31* 29*  CREATININE 0.94 1.07* 1.11* 1.21* 1.03*  CALCIUM 8.6* 8.6* 8.4* 8.1* 8.0*    CBC:  Recent Labs Lab 09/11/15 1527 09/11/15 1944  09/13/15 0400 09/15/15 0347 09/15/15 0600 09/15/15 1924 09/16/15 0452  WBC 11.5* 10.5  < > 16.6* 15.7* 16.9* 17.0* 17.4*  NEUTROABS 9.9* 10.1*  --   --   --   --   --   --   HGB 8.9* 8.4*  < > 8.7* 6.2* 6.6* 8.4* 8.1*  HCT 32.0* 30.8*  < > 31.3* 21.9* 23.2* 28.1* 26.7*  MCV 80.8 80.6  < > 82.4 79.6 80.6 80.1 80.2  PLT 335 272  < > 287 227 203 222 190  < > = values in this interval not displayed.  Liver Function Tests:  Recent Labs Lab 09/16/15 0452  AST 24  ALT 24  ALKPHOS 103  BILITOT 0.9  PROT 5.5*  ALBUMIN 2.7*   Coags:  Recent Labs Lab 09/11/15 1527  INR 2.11*    Recent Labs Lab 09/11/15 2120  APTT 27    Cardiac Enzymes:  Recent Labs Lab 09/11/15 1527 09/14/15 1320 09/14/15 1753 09/14/15 2332  TROPONINI 0.04* 0.05* 0.04* 0.04*    Recent Results (from the past 240 hour(s))  Blood Culture (routine x 2)     Status: None (Preliminary result)   Collection Time: 09/11/15  3:27 PM  Result Value Ref Range Status   Specimen Description BLOOD RIGHT FOREARM  Final   Special Requests BOTTLES DRAWN AEROBIC AND ANAEROBIC 5CC  Final   Culture NO GROWTH 4 DAYS  Final   Report Status PENDING  Incomplete  Blood Culture (routine x 2)     Status: None (Preliminary result)   Collection Time: 09/11/15   7:44 PM  Result Value Ref Range Status   Specimen Description BLOOD RIGHT HAND  Final   Special Requests BOTTLES DRAWN AEROBIC ONLY 6CC  Final   Culture NO GROWTH 4 DAYS  Final   Report Status PENDING  Incomplete  MRSA PCR Screening     Status: None   Collection Time: 09/11/15  8:23 PM  Result Value Ref Range Status   MRSA by PCR NEGATIVE NEGATIVE Final    Comment:        The GeneXpert MRSA Assay (FDA approved for NASAL specimens only), is one component of a comprehensive MRSA colonization surveillance program.  It is not intended to diagnose MRSA infection nor to guide or monitor treatment for MRSA infections.   Urine culture     Status: None   Collection Time: 09/11/15  9:48 PM  Result Value Ref Range Status   Specimen Description URINE, CLEAN CATCH  Final   Special Requests NONE  Final   Culture MULTIPLE SPECIES PRESENT, SUGGEST RECOLLECTION  Final   Report Status 09/13/2015 FINAL  Final     Studies:   Recent x-ray studies have been reviewed in detail by the Attending Physician  Scheduled Meds:  Scheduled Meds: . sodium chloride   Intravenous Once  . antiseptic oral rinse  7 mL Mouth Rinse BID  . arformoterol  15 mcg Nebulization BID  . atorvastatin  10 mg Oral QHS  . budesonide  0.25 mg Nebulization BID  . cloNIDine  0.1 mg Oral BID  . diltiazem  120 mg Oral BID  . furosemide  40 mg Intravenous Daily  . ipratropium  0.5 mg Nebulization Once  . ipratropium  0.5 mg Nebulization QID  . ipratropium-albuterol  3 mL Nebulization Once  . levalbuterol  0.63 mg Nebulization QID  . levothyroxine  100 mcg Oral QAC breakfast  . predniSONE  20 mg Oral Q breakfast  . sodium chloride  3 mL Intravenous Q12H    Time spent on care of this patient: 35 mins   Richie Bonanno T , MD   [facility] Office  [phone] Pager - Text Page per Shea Evans as per below:  On-Call/Text Page:      [email or web address]      password [email or web address]  If 7PM-7AM, please contact  night-coverage [email or web address] Password [email or web address] 09/16/2015, 11:53 AM   LOS: 5 days

## 2015-09-16 NOTE — Progress Notes (Signed)
CSW confirmed w/ pt that she would be going to [facility] [place] at discharge by [place].  Percell Locus Anthonyjames Bargar LCSWA [phone]

## 2015-09-17 DIAGNOSIS — L8990 Pressure ulcer of unspecified site, unspecified stage: Secondary | ICD-10-CM | POA: Insufficient documentation

## 2015-09-17 LAB — CBC
HCT: 27.3 % — ABNORMAL LOW (ref 36.0–46.0)
HEMOGLOBIN: 8.1 g/dL — AB (ref 12.0–15.0)
MCH: 24.2 pg — ABNORMAL LOW (ref 26.0–34.0)
MCHC: 29.7 g/dL — ABNORMAL LOW (ref 30.0–36.0)
MCV: 81.5 fL (ref 78.0–100.0)
Platelets: 225 10*3/uL (ref 150–400)
RBC: 3.35 MIL/uL — AB (ref 3.87–5.11)
RDW: 18.3 % — ABNORMAL HIGH (ref 11.5–15.5)
WBC: 17.5 10*3/uL — AB (ref 4.0–10.5)

## 2015-09-17 LAB — BASIC METABOLIC PANEL
Anion gap: 9 (ref 5–15)
BUN: 26 mg/dL — ABNORMAL HIGH (ref 6–20)
CHLORIDE: 96 mmol/L — AB (ref 101–111)
CO2: 36 mmol/L — ABNORMAL HIGH (ref 22–32)
Calcium: 8.1 mg/dL — ABNORMAL LOW (ref 8.9–10.3)
Creatinine, Ser: 1.12 mg/dL — ABNORMAL HIGH (ref 0.44–1.00)
GFR calc non Af Amer: 49 mL/min — ABNORMAL LOW (ref >60)
GFR, EST AFRICAN AMERICAN: 57 mL/min — AB (ref >60)
Glucose, Bld: 92 mg/dL (ref 65–99)
POTASSIUM: 3.9 mmol/L (ref 3.5–5.1)
SODIUM: 141 mmol/L (ref 135–145)

## 2015-09-17 MED ORDER — FUROSEMIDE 10 MG/ML IJ SOLN
80.0000 mg | Freq: Once | INTRAMUSCULAR | Status: AC
  Administered 2015-09-17: 80 mg via INTRAVENOUS
  Filled 2015-09-17: qty 8

## 2015-09-17 MED ORDER — FUROSEMIDE 10 MG/ML IJ SOLN: 80.0000 mg | Freq: Two times a day (BID) | INTRAMUSCULAR | Status: DC

## 2015-09-17 MED ORDER — FUROSEMIDE 40 MG PO TABS
40.0000 mg | ORAL_TABLET | Freq: Two times a day (BID) | ORAL | Status: DC
  Administered 2015-09-17 – 2015-09-24 (×15): 40 mg via ORAL
  Filled 2015-09-17 (×15): qty 1

## 2015-09-17 NOTE — Anesthesia Preprocedure Evaluation (Addendum)
Anesthesia Evaluation  Patient identified by MRN, date of birth, ID band Patient awake    Reviewed: Allergy & Precautions, H&P , NPO status , Patient's Chart, lab work & pertinent test results  Airway Mallampati: III  TM Distance: >3 FB Neck ROM: Limited    Dental  (+) Missing, Upper Dentures, Lower Dentures, Poor Dentition   Pulmonary shortness of breath, COPD,  oxygen dependent, former smoker,     + decreased breath sounds+ wheezing      Cardiovascular hypertension, +CHF  + dysrhythmias Atrial Fibrillation  Rhythm:Irregular Rate:Normal  Ef 55-60, elevated pulmonary pressures per TTE on 12/22, mildly dilated RV, mildly reduced RV function   Neuro/Psych PSYCHIATRIC DISORDERS Anxiety    GI/Hepatic hiatal hernia, GERD  ,  Endo/Other  Hypothyroidism   Renal/GU Renal disease     Musculoskeletal   Abdominal   Peds  Hematology  (+) anemia ,   Anesthesia Other Findings 02 requirement back to baseline with diuresis, Hgb 8 and stable -on 4L [place] oxygen  Reproductive/Obstetrics                            Anesthesia Physical  Anesthesia Plan  ASA: III  Anesthesia Plan: General   Post-op Pain Management:    Induction: Intravenous  Airway Management Planned: LMA  Additional Equipment:   Intra-op Plan:   Post-operative Plan: Extubation in OR  Informed Consent: I have reviewed the patients History and Physical, chart, labs and discussed the procedure including the risks, benefits and alternatives for the proposed anesthesia with the patient or authorized representative who has indicated his/her understanding and acceptance.   Dental advisory given  Plan Discussed with: CRNA and Anesthesiologist  Anesthesia Plan Comments:         Anesthesia Quick Evaluation

## 2015-09-17 NOTE — Progress Notes (Signed)
Patient ID: Caitlin Hampton, female   DOB: 1946/07/07, 69 y.o.   MRN: [id number] I have spoken to her in detail and she understands fully the need to proceed to the OR tomorrow am 12/26 to assess her right leg wound and clean it up as much as possible. We will then place a VAC to help promote granulation/healing.  She may still need a skin graft later.  NPO after midnight tonight.

## 2015-09-17 NOTE — Progress Notes (Signed)
[place] TEAM 1 - Stepdown/ICU TEAM  Caitlin Hampton QPY:[id number] DOB: Feb 09, 1946 DOA: 09/11/2015 PCP: [name], MD  Admit HPI / Brief Narrative: 69 year old female w/ a hx of COPD on chronic 3 L oxygen, atrial fibrillation on chronic anticoagulation, and CAD who had been having one week of increased dyspnea on exertion and nonproductive cough, but came to the emergency room on 12/19 after she fell hitting her R leg which led to a large shin hematoma. In the emergency room, patient was found to have acute respiratory failure requiring 5-6 L of oxygen. Chest x-ray was not revealing. Patient started on IV antibiotics and treated with sepsis protocol with aggressive fluid resuscitation. UA came back concerning for a urinary tract infection.   On 09/14/2015 patient noted to be in acute on chronic respiratory distress on a Venturi mask with sats in the high 80s to low 90s. Repeat chest x-ray consistent with volume overload. Patient also noted to have a significantly enlarged hematoma on the right lower extremity. Dr Ninfa Linden was consulted for evacuation of right lower extremity hematoma.  HPI/Subjective: The pt has no new complaints today.  She is much less sob.  She denies cp.    Assessment/Plan:  acute on chronic hypoxic respiratory failure - multifactorial  acute on chronic diastolic heart failure in the setting of probable COPD exacerbation and ?community-acquired pneumonia - able to be weaned down to 4L O2 over last 24hrs  ?sepsis v/s SIRS  met criteria for probable sepsis given tachypnea, tachycardia, leukocytosis and UTI +/- CAP  ?community-acquired pneumonia procalcitonin not suggestive of an active pulmonary infection - f/u CXRs have not shown an infiltrate - stopped abx after 5th dose - remains afebrile   acute on chronic Right Heart Failure  EF 55-60% on TTE 12/22 + RV failure and sever Pulm HTN appreciated - baseline weight of late as low as 110 kg (12/2) - currently 107.5kg -  net positive ~3.5L this admit?, but question if this is accurate - continue diuretic at increased dose - watch renal fxn   bacteria in the urine Urine cultures not helpful - UA + but c/w an unclean sample - has completed a course of abx tx   hematoma of the right lower extremity Dr. Ninfa Linden was initially able to use a #10 blade at the bedside to remove necrotic skin and evacuate a very large hematoma - in f/u Ortho has decided OR I&D will be required - anticoag stopped 12/23 due to ongoing bleeding   anemia due to acute blood loss Transfuse as required to keep Hgb 7.0 or > - stable for now   acute renal insufficiency  crt had been climbing, but appears to have stabilized for now - follow trend  hypothyroidism Continue home dose Synthroid - TSH 1.984  chronic atrial fibrillation Continue Cardizem - resume Xarelto when bleeding ceases - rate well controleld   Obesity - Body mass index is 40.66 kg/(m^2).  Code Status: FULL Family Communication: no family present at time of exam today  Disposition Plan: SDU until out of OR   Consultants: Orthopedics - Dr. Ninfa Linden  Procedures: 12/22 - bedside debridement and evacuation of R LE hematoma  12/22 - TTE as noted above   Antibiotics: Rocephin 12/19 > 12/23 Azithromycin 12/19 > 12/20  DVT prophylaxis: SCD L LE   Objective: Blood pressure 143/76, pulse 79, temperature 98.2 F (36.8 C), temperature source Oral, resp. rate 20, height '5\' 4"'$  (1.626 m), weight 107.5 kg (236 lb 15.9 oz), SpO2 96 %.  Intake/Output Summary (Last 24 hours) at 09/17/15 1125 Last data filed at 09/16/15 2200  Gross per 24 hour  Intake    240 ml  Output    100 ml  Net    140 ml   Exam: General: No acute respiratory distress Lungs: CTA th/o - no wheeze or crackles  Cardiovascular: Regular rate without murmur gallop or rub - distant HS  Abdomen: Nontender, nondistended, soft, bowel sounds positive Extremities: No significant cyanosis, or clubbing, R LE  dressed and currently dry, trace edema B LE  Data Reviewed: Basic Metabolic Panel:  Recent Labs Lab 09/13/15 0400 09/14/15 0228 09/15/15 0347 09/16/15 0452 09/17/15 0225  NA 140 140 142 142 141  K 3.5 3.5 3.8 5.0 3.9  CL 105 99* 99* 98* 96*  CO2 24 30 32 37* 36*  GLUCOSE 144* 129* 107* 112* 92  BUN 18 26* 31* 29* 26*  CREATININE 1.07* 1.11* 1.21* 1.03* 1.12*  CALCIUM 8.6* 8.4* 8.1* 8.0* 8.1*    CBC:  Recent Labs Lab 09/11/15 1527 09/11/15 1944  09/15/15 0347 09/15/15 0600 09/15/15 1924 09/16/15 0452 09/17/15 0225  WBC 11.5* 10.5  < > 15.7* 16.9* 17.0* 17.4* 17.5*  NEUTROABS 9.9* 10.1*  --   --   --   --   --   --   HGB 8.9* 8.4*  < > 6.2* 6.6* 8.4* 8.1* 8.1*  HCT 32.0* 30.8*  < > 21.9* 23.2* 28.1* 26.7* 27.3*  MCV 80.8 80.6  < > 79.6 80.6 80.1 80.2 81.5  PLT 335 272  < > 227 203 222 190 225  < > = values in this interval not displayed.  Liver Function Tests:  Recent Labs Lab 09/16/15 0452  AST 24  ALT 24  ALKPHOS 103  BILITOT 0.9  PROT 5.5*  ALBUMIN 2.7*   Coags:  Recent Labs Lab 09/11/15 1527  INR 2.11*    Recent Labs Lab 09/11/15 2120  APTT 27    Cardiac Enzymes:  Recent Labs Lab 09/11/15 1527 09/14/15 1320 09/14/15 1753 09/14/15 2332  TROPONINI 0.04* 0.05* 0.04* 0.04*    Recent Results (from the past 240 hour(s))  Blood Culture (routine x 2)     Status: None   Collection Time: 09/11/15  3:27 PM  Result Value Ref Range Status   Specimen Description BLOOD RIGHT FOREARM  Final   Special Requests BOTTLES DRAWN AEROBIC AND ANAEROBIC 5CC  Final   Culture NO GROWTH 5 DAYS  Final   Report Status 09/16/2015 FINAL  Final  Blood Culture (routine x 2)     Status: None   Collection Time: 09/11/15  7:44 PM  Result Value Ref Range Status   Specimen Description BLOOD RIGHT HAND  Final   Special Requests BOTTLES DRAWN AEROBIC ONLY 6CC  Final   Culture NO GROWTH 5 DAYS  Final   Report Status 09/16/2015 FINAL  Final  MRSA PCR Screening      Status: None   Collection Time: 09/11/15  8:23 PM  Result Value Ref Range Status   MRSA by PCR NEGATIVE NEGATIVE Final    Comment:        The GeneXpert MRSA Assay (FDA approved for NASAL specimens only), is one component of a comprehensive MRSA colonization surveillance program. It is not intended to diagnose MRSA infection nor to guide or monitor treatment for MRSA infections.   Urine culture     Status: None   Collection Time: 09/11/15  9:48 PM  Result Value Ref Range Status  Specimen Description URINE, CLEAN CATCH  Final   Special Requests NONE  Final   Culture MULTIPLE SPECIES PRESENT, SUGGEST RECOLLECTION  Final   Report Status 09/13/2015 FINAL  Final     Studies:   Recent x-ray studies have been reviewed in detail by the Attending Physician  Scheduled Meds:  Scheduled Meds: . antiseptic oral rinse  7 mL Mouth Rinse BID  . arformoterol  15 mcg Nebulization BID  . atorvastatin  10 mg Oral QHS  . budesonide  0.25 mg Nebulization BID  . cloNIDine  0.1 mg Oral BID  . diltiazem  120 mg Oral BID  . furosemide  80 mg Intravenous Q12H  . ipratropium  0.5 mg Nebulization QID  . levalbuterol  0.63 mg Nebulization QID  . levothyroxine  100 mcg Oral QAC breakfast  . potassium chloride  20 mEq Oral BID  . predniSONE  20 mg Oral Q breakfast  . sodium chloride  3 mL Intravenous Q12H    Time spent on care of this patient: 25 mins   Deborah Lazcano T , MD   [facility] Office  [phone] Pager - Text Page per Shea Evans as per below:  On-Call/Text Page:      [email or web address]      password [email or web address]  If 7PM-7AM, please contact night-coverage [email or web address] Password [email or web address] 09/17/2015, 11:25 AM   LOS: 6 days

## 2015-09-18 ENCOUNTER — Inpatient Hospital Stay (HOSPITAL_COMMUNITY): Payer: Medicare Other | Admitting: Anesthesiology

## 2015-09-18 ENCOUNTER — Encounter (HOSPITAL_COMMUNITY)
Admission: EM | Disposition: A | Discharge status: Skilled Nursing Facility | Payer: Self-pay | Source: Home / Self Care | Attending: Internal Medicine

## 2015-09-18 ENCOUNTER — Encounter (HOSPITAL_COMMUNITY): Payer: Self-pay | Admitting: Anesthesiology

## 2015-09-18 HISTORY — PX: APPLICATION OF WOUND VAC: SHX5189

## 2015-09-18 HISTORY — PX: I&D EXTREMITY: SHX5045

## 2015-09-18 LAB — BASIC METABOLIC PANEL
ANION GAP: 11 (ref 5–15)
BUN: 27 mg/dL — ABNORMAL HIGH (ref 6–20)
CHLORIDE: 96 mmol/L — AB (ref 101–111)
CO2: 34 mmol/L — AB (ref 22–32)
Calcium: 8.4 mg/dL — ABNORMAL LOW (ref 8.9–10.3)
Creatinine, Ser: 1.09 mg/dL — ABNORMAL HIGH (ref 0.44–1.00)
GFR calc non Af Amer: 51 mL/min — ABNORMAL LOW (ref >60)
GFR, EST AFRICAN AMERICAN: 59 mL/min — AB (ref >60)
Glucose, Bld: 103 mg/dL — ABNORMAL HIGH (ref 65–99)
Potassium: 4.8 mmol/L (ref 3.5–5.1)
Sodium: 141 mmol/L (ref 135–145)

## 2015-09-18 LAB — CBC
HCT: 27.7 % — ABNORMAL LOW (ref 36.0–46.0)
HEMOGLOBIN: 7.8 g/dL — AB (ref 12.0–15.0)
MCH: 23.5 pg — AB (ref 26.0–34.0)
MCHC: 28.2 g/dL — ABNORMAL LOW (ref 30.0–36.0)
MCV: 83.4 fL (ref 78.0–100.0)
Platelets: 232 10*3/uL (ref 150–400)
RBC: 3.32 MIL/uL — AB (ref 3.87–5.11)
RDW: 19 % — ABNORMAL HIGH (ref 11.5–15.5)
WBC: 15.8 10*3/uL — ABNORMAL HIGH (ref 4.0–10.5)

## 2015-09-18 SURGERY — IRRIGATION AND DEBRIDEMENT EXTREMITY
Anesthesia: General | Site: Leg Upper | Laterality: Right

## 2015-09-18 MED ORDER — FENTANYL CITRATE (PF) 100 MCG/2ML IJ SOLN
INTRAMUSCULAR | Status: DC | PRN
  Administered 2015-09-18: 25 ug via INTRAVENOUS

## 2015-09-18 MED ORDER — 0.9 % SODIUM CHLORIDE (POUR BTL) OPTIME
TOPICAL | Status: DC | PRN
  Administered 2015-09-18: 1000 mL

## 2015-09-18 MED ORDER — LIDOCAINE HCL (CARDIAC) 20 MG/ML IV SOLN
INTRAVENOUS | Status: AC
  Filled 2015-09-18: qty 5

## 2015-09-18 MED ORDER — PHENYLEPHRINE HCL 10 MG/ML IJ SOLN
INTRAMUSCULAR | Status: DC | PRN
  Administered 2015-09-18: 80 ug via INTRAVENOUS
  Administered 2015-09-18: 40 ug via INTRAVENOUS

## 2015-09-18 MED ORDER — SODIUM CHLORIDE 0.9 % IV SOLN
10.0000 mg | INTRAVENOUS | Status: DC | PRN
Start: 2015-09-18 — End: 2015-09-18
  Administered 2015-09-18: 10 ug/min via INTRAVENOUS

## 2015-09-18 MED ORDER — DEXAMETHASONE SODIUM PHOSPHATE 4 MG/ML IJ SOLN
INTRAMUSCULAR | Status: DC | PRN
  Administered 2015-09-18: 8 mg via INTRAVENOUS

## 2015-09-18 MED ORDER — OXYCODONE HCL 5 MG/5ML PO SOLN: 5.0000 mg | Freq: Once | ORAL | Status: AC | PRN

## 2015-09-18 MED ORDER — MIDAZOLAM HCL 2 MG/2ML IJ SOLN
INTRAMUSCULAR | Status: AC
  Filled 2015-09-18: qty 2

## 2015-09-18 MED ORDER — PROPOFOL 10 MG/ML IV BOLUS
INTRAVENOUS | Status: AC
  Filled 2015-09-18: qty 20

## 2015-09-18 MED ORDER — OXYCODONE HCL 5 MG PO TABS
5.0000 mg | ORAL_TABLET | Freq: Once | ORAL | Status: AC | PRN
  Administered 2015-09-18: 5 mg via ORAL

## 2015-09-18 MED ORDER — LIDOCAINE HCL (CARDIAC) 20 MG/ML IV SOLN
INTRAVENOUS | Status: DC | PRN
  Administered 2015-09-18: 100 mg via INTRAVENOUS

## 2015-09-18 MED ORDER — SODIUM CHLORIDE 0.9 % IR SOLN
Status: DC | PRN
  Administered 2015-09-18: 3000 mL

## 2015-09-18 MED ORDER — PREDNISONE 10 MG PO TABS
10.0000 mg | ORAL_TABLET | Freq: Every day | ORAL | Status: DC
  Administered 2015-09-19 – 2015-09-24 (×6): 10 mg via ORAL
  Filled 2015-09-18 (×2): qty 1
  Filled 2015-09-18 (×2): qty 2
  Filled 2015-09-18 (×2): qty 1

## 2015-09-18 MED ORDER — ONDANSETRON HCL 4 MG/2ML IJ SOLN
INTRAMUSCULAR | Status: AC
  Filled 2015-09-18: qty 2

## 2015-09-18 MED ORDER — DEXAMETHASONE SODIUM PHOSPHATE 4 MG/ML IJ SOLN
INTRAMUSCULAR | Status: AC
  Filled 2015-09-18: qty 2

## 2015-09-18 MED ORDER — ONDANSETRON HCL 4 MG/2ML IJ SOLN: 4.0000 mg | Freq: Once | INTRAMUSCULAR | Status: DC | PRN

## 2015-09-18 MED ORDER — RIVAROXABAN 20 MG PO TABS: 20.0000 mg | ORAL_TABLET | Freq: Every day | ORAL | Status: DC

## 2015-09-18 MED ORDER — LACTATED RINGERS IV SOLN
INTRAVENOUS | Status: DC | PRN
  Administered 2015-09-18: 07:00:00 via INTRAVENOUS

## 2015-09-18 MED ORDER — EPHEDRINE SULFATE 50 MG/ML IJ SOLN
INTRAMUSCULAR | Status: AC
  Filled 2015-09-18: qty 1

## 2015-09-18 MED ORDER — SUCCINYLCHOLINE CHLORIDE 20 MG/ML IJ SOLN
INTRAMUSCULAR | Status: AC
  Filled 2015-09-18: qty 1

## 2015-09-18 MED ORDER — PROPOFOL 10 MG/ML IV BOLUS
INTRAVENOUS | Status: DC | PRN
  Administered 2015-09-18: 90 mg via INTRAVENOUS

## 2015-09-18 MED ORDER — FENTANYL CITRATE (PF) 250 MCG/5ML IJ SOLN
INTRAMUSCULAR | Status: AC
  Filled 2015-09-18: qty 5

## 2015-09-18 MED ORDER — ONDANSETRON HCL 4 MG/2ML IJ SOLN
INTRAMUSCULAR | Status: DC | PRN
  Administered 2015-09-18: 4 mg via INTRAVENOUS

## 2015-09-18 MED ORDER — OXYCODONE HCL 5 MG PO TABS
ORAL_TABLET | ORAL | Status: AC
  Filled 2015-09-18: qty 1

## 2015-09-18 MED ORDER — FENTANYL CITRATE (PF) 100 MCG/2ML IJ SOLN: 25.0000 ug | INTRAMUSCULAR | Status: DC | PRN

## 2015-09-18 SURGICAL SUPPLY — 65 items
BANDAGE ACE 6X5 VEL STRL LF (GAUZE/BANDAGES/DRESSINGS) ×4 IMPLANT
BANDAGE ELASTIC 3 VELCRO ST LF (GAUZE/BANDAGES/DRESSINGS) IMPLANT
BLADE SURG 10 STRL SS (BLADE) IMPLANT
BNDG COHESIVE 1X5 TAN STRL LF (GAUZE/BANDAGES/DRESSINGS) IMPLANT
BNDG COHESIVE 4X5 TAN STRL (GAUZE/BANDAGES/DRESSINGS) IMPLANT
BNDG COHESIVE 6X5 TAN STRL LF (GAUZE/BANDAGES/DRESSINGS) IMPLANT
BNDG CONFORM 3 STRL LF (GAUZE/BANDAGES/DRESSINGS) IMPLANT
BNDG GAUZE STRTCH 6 (GAUZE/BANDAGES/DRESSINGS) IMPLANT
CANISTER WOUND CARE 500ML ATS (WOUND CARE) ×4 IMPLANT
CORDS BIPOLAR (ELECTRODE) IMPLANT
COVER SURGICAL LIGHT HANDLE (MISCELLANEOUS) ×4 IMPLANT
CUFF TOURNIQUET SINGLE 18IN (TOURNIQUET CUFF) IMPLANT
CUFF TOURNIQUET SINGLE 24IN (TOURNIQUET CUFF) IMPLANT
CUFF TOURNIQUET SINGLE 34IN LL (TOURNIQUET CUFF) IMPLANT
CUFF TOURNIQUET SINGLE 44IN (TOURNIQUET CUFF) IMPLANT
DRAPE INCISE IOBAN 66X45 STRL (DRAPES) ×8 IMPLANT
DRAPE ORTHO SPLIT 77X108 STRL (DRAPES) ×4
DRAPE SURG 17X23 STRL (DRAPES) IMPLANT
DRAPE SURG ORHT 6 SPLT 77X108 (DRAPES) ×4 IMPLANT
DRAPE U-SHAPE 47X51 STRL (DRAPES) ×4 IMPLANT
DRSG MEPILEX BORDER 4X4 (GAUZE/BANDAGES/DRESSINGS) ×4 IMPLANT
DRSG VAC ATS MED SENSATRAC (GAUZE/BANDAGES/DRESSINGS) ×4 IMPLANT
DURAPREP 26ML APPLICATOR (WOUND CARE) IMPLANT
ELECT CAUTERY BLADE 6.4 (BLADE) ×4 IMPLANT
ELECT REM PT RETURN 9FT ADLT (ELECTROSURGICAL) ×4
ELECTRODE REM PT RTRN 9FT ADLT (ELECTROSURGICAL) ×2 IMPLANT
GAUZE SPONGE 4X4 12PLY STRL (GAUZE/BANDAGES/DRESSINGS) IMPLANT
GAUZE XEROFORM 1X8 LF (GAUZE/BANDAGES/DRESSINGS) IMPLANT
GAUZE XEROFORM 5X9 LF (GAUZE/BANDAGES/DRESSINGS) ×4 IMPLANT
GLOVE BIO SURGEON STRL SZ7 (GLOVE) ×4 IMPLANT
GLOVE BIO SURGEON STRL SZ8 (GLOVE) ×4 IMPLANT
GLOVE BIOGEL PI IND STRL 7.0 (GLOVE) ×2 IMPLANT
GLOVE BIOGEL PI IND STRL 8 (GLOVE) ×4 IMPLANT
GLOVE BIOGEL PI INDICATOR 7.0 (GLOVE) ×2
GLOVE BIOGEL PI INDICATOR 8 (GLOVE) ×4
GLOVE ORTHO TXT STRL SZ7.5 (GLOVE) ×4 IMPLANT
GOWN STRL REUS W/ TWL LRG LVL3 (GOWN DISPOSABLE) ×2 IMPLANT
GOWN STRL REUS W/ TWL XL LVL3 (GOWN DISPOSABLE) ×2 IMPLANT
GOWN STRL REUS W/TWL LRG LVL3 (GOWN DISPOSABLE) ×2
GOWN STRL REUS W/TWL XL LVL3 (GOWN DISPOSABLE) ×2
HANDPIECE INTERPULSE COAX TIP (DISPOSABLE)
KIT BASIN OR (CUSTOM PROCEDURE TRAY) ×4 IMPLANT
KIT ROOM TURNOVER OR (KITS) ×4 IMPLANT
MANIFOLD NEPTUNE II (INSTRUMENTS) ×4 IMPLANT
NS IRRIG 1000ML POUR BTL (IV SOLUTION) ×4 IMPLANT
PACK ORTHO EXTREMITY (CUSTOM PROCEDURE TRAY) ×4 IMPLANT
PAD ARMBOARD 7.5X6 YLW CONV (MISCELLANEOUS) ×4 IMPLANT
PADDING CAST ABS 4INX4YD NS (CAST SUPPLIES)
PADDING CAST ABS COTTON 4X4 ST (CAST SUPPLIES) IMPLANT
PADDING CAST COTTON 6X4 STRL (CAST SUPPLIES) IMPLANT
SET HNDPC FAN SPRY TIP SCT (DISPOSABLE) IMPLANT
SPONGE LAP 18X18 X RAY DECT (DISPOSABLE) IMPLANT
STOCKINETTE IMPERVIOUS 9X36 MD (GAUZE/BANDAGES/DRESSINGS) ×4 IMPLANT
SUT ETHILON 2 0 FS 18 (SUTURE) IMPLANT
SUT ETHILON 3 0 PS 1 (SUTURE) IMPLANT
SYR CONTROL 10ML LL (SYRINGE) IMPLANT
TOWEL OR 17X24 6PK STRL BLUE (TOWEL DISPOSABLE) IMPLANT
TOWEL OR 17X26 10 PK STRL BLUE (TOWEL DISPOSABLE) ×4 IMPLANT
TUBE ANAEROBIC SPECIMEN COL (MISCELLANEOUS) IMPLANT
TUBE CONNECTING 12'X1/4 (SUCTIONS) ×1
TUBE CONNECTING 12X1/4 (SUCTIONS) ×3 IMPLANT
TUBE FEEDING 5FR 15 INCH (TUBING) IMPLANT
UNDERPAD 30X30 INCONTINENT (UNDERPADS AND DIAPERS) ×4 IMPLANT
WATER STERILE IRR 1000ML POUR (IV SOLUTION) IMPLANT
YANKAUER SUCT BULB TIP NO VENT (SUCTIONS) ×4 IMPLANT

## 2015-09-18 NOTE — Progress Notes (Signed)
Left vac order form on shadow chart if needed.

## 2015-09-18 NOTE — Transfer of Care (Signed)
Immediate Anesthesia Transfer of Care Note  Patient: Caitlin Hampton  Procedure(s) Performed: Procedure(s): IRRIGATION AND DEBRIDEMENT RIGHT LOWER LEG. (Right) APPLICATION OF WOUND VAC RIGHT LOWER LEG (Right)  Patient Location: PACU  Anesthesia Type:General  Level of Consciousness: awake, alert  and oriented  Airway & Oxygen Therapy: Patient Spontanous Breathing and Patient connected to nasal cannula oxygen  Post-op Assessment: Report given to RN, Post -op Vital signs reviewed and stable and Patient moving all extremities X 4  Post vital signs: Reviewed and stable  Last Vitals:  Filed Vitals:   09/18/15 0600 09/18/15 0657  BP:    Pulse: 92 93  Temp:    Resp: 17 19    Complications: No apparent anesthesia complications

## 2015-09-18 NOTE — Progress Notes (Signed)
Patient ID: Caitlin Hampton, female   DOB: Jan 12, 1946, 69 y.o.   MRN: [id number] The plan is to proceed to the OR today for irrigation/debridement of her right leg hematoma and place a VAC sponge to promote healing.

## 2015-09-18 NOTE — Brief Op Note (Signed)
09/11/2015 - 09/18/2015  8:02 AM  PATIENT:  Caitlin Hampton  69 y.o. female  PRE-OPERATIVE DIAGNOSIS:  HEMATOMA RIGHT LEG  POST-OPERATIVE DIAGNOSIS:  HEMATOMA RIGHT LEG  PROCEDURE:  Procedure(s): IRRIGATION AND DEBRIDEMENT RIGHT LOWER LEG. (Right) APPLICATION OF WOUND VAC RIGHT LOWER LEG (Right)  SURGEON:  Surgeon(s) and Role:    * Mcarthur Rossetti, MD - Primary  ANESTHESIA:   general  EBL:   minimal  COUNTS:  YES  TOURNIQUET:  * No tourniquets in log *  DICTATION: .Other Dictation: Dictation Number [id number]  PLAN OF CARE: Admit to inpatient   PATIENT DISPOSITION:  PACU - hemodynamically stable.   Delay start of Pharmacological VTE agent (>24hrs) due to surgical blood loss or risk of bleeding: no

## 2015-09-18 NOTE — Anesthesia Procedure Notes (Signed)
Procedure Name: LMA Insertion Date/Time: 09/18/2015 7:32 AM Performed by: Rush Farmer E Pre-anesthesia Checklist: Patient identified, Emergency Drugs available, Suction available, Patient being monitored and Timeout performed Patient Re-evaluated:Patient Re-evaluated prior to inductionOxygen Delivery Method: Circle system utilized Preoxygenation: Pre-oxygenation with 100% oxygen Intubation Type: IV induction LMA: LMA inserted LMA Size: 4.0 Number of attempts: 1 Placement Confirmation: positive ETCO2 and breath sounds checked- equal and bilateral Tube secured with: Tape Dental Injury: Teeth and Oropharynx as per pre-operative assessment

## 2015-09-18 NOTE — Progress Notes (Signed)
[facility] TEAM 1 - Stepdown/ICU TEAM  Caitlin Hampton MLY:[id number] DOB: 18-May-1946 DOA: 09/11/2015 PCP: [name], MD  Admit HPI / Brief Narrative: 69 year old female w/ a hx of COPD on chronic 3 L oxygen, atrial fibrillation on chronic anticoagulation, and CAD who had been having one week of increased dyspnea on exertion and nonproductive cough, but came to the emergency room on 12/19 after she fell hitting her R leg which led to a large shin hematoma. In the emergency room, patient was found to have acute respiratory failure requiring 5-6 L of oxygen. Chest x-ray was not revealing. Patient started on IV antibiotics and treated with sepsis protocol with aggressive fluid resuscitation. UA came back concerning for a urinary tract infection.   On 09/14/2015 patient noted to be in acute on chronic respiratory distress on a Venturi mask with sats in the high 80s to low 90s. Repeat chest x-ray consistent with volume overload. Patient also noted to have a significantly enlarged hematoma on the right lower extremity. Dr Ninfa Linden was consulted for evacuation of right lower extremity hematoma.  HPI/Subjective: The pt went to the OR this morning for I&D of her R LE hematoma.  She is resting comfortably in bed w/o dyspnea, cp, n/v, or abdom pain.  She has a good appetite, and states her pain is well controlled.      Assessment/Plan:  acute on chronic hypoxic respiratory failure - multifactorial  acute on chronic diastolic heart failure in the setting of probable COPD exacerbation and ?community-acquired pneumonia - able to be weaned down to 4L O2 - approaching home dose of 3L continuous  ?sepsis v/s SIRS  met criteria for SIRS v/s sepsis given tachypnea, tachycardia, leukocytosis and ?UTI +/- CAP  ?community-acquired pneumonia procalcitonin not suggestive of an active pulmonary infection - f/u CXRs have not shown an infiltrate - stopped abx after 5th dose - remains afebrile   bacteria in the  urine Urine cultures not helpful - UA + but c/w an unclean sample - has completed a course of abx tx   acute on chronic Right Heart Failure  EF 55-60% on TTE 12/22 + RV failure and sever Pulm HTN appreciated - baseline weight of late as low as 110 kg (12/2) - currently 107.2kg - net positive ~4.8L this admit?, but question if this is accurate - continue diuretic at increased dose - renal fxn stable   hematoma of the right lower extremity Dr. Ninfa Linden was initially able to use a #10 blade at the bedside to remove necrotic skin and evacuate a very large hematoma - in f/u Ortho has decided OR I&D will be required - anticoag stopped 12/23 due to ongoing bleeding - s/p I&D w/ vac placement 12/26 - plan to resume anticoag in AM   anemia due to acute blood loss Transfuse as required to keep Hgb 7.0 or > - stable Hgb today   acute renal insufficiency  crt appears to have stabilized for now - follow trend  hypothyroidism Continue home dose Synthroid - TSH 1.984  chronic atrial fibrillation Continue Cardizem - resume Xarelto in AM and follow - rate well controleld   Obesity - Body mass index is 40.55 kg/(m^2).  Code Status: FULL Family Communication: no family present at time of exam today  Disposition Plan: stable for transfer to tele bed - begin PT/OT - anticipate rehab stay at a SNF will be indicated - watch of bleeding w/ resumption of Xarelto   Consultants: Orthopedics - Dr. Ninfa Linden  Procedures: 12/22 -  bedside debridement and evacuation of R LE hematoma  12/22 - TTE as noted above   Antibiotics: Rocephin 12/19 > 12/23 Azithromycin 12/19 > 12/20  DVT prophylaxis: SCD L LE   Objective: Blood pressure 106/90, pulse 91, temperature 97.5 F (36.4 C), temperature source Oral, resp. rate 21, height '5\' 4"'$  (1.626 m), weight 107.2 kg (236 lb 5.3 oz), SpO2 100 %.  Intake/Output Summary (Last 24 hours) at 09/18/15 1454 Last data filed at 09/18/15 1000  Gross per 24 hour  Intake     840 ml  Output     10 ml  Net    830 ml   Exam: General: No acute respiratory distress- alert and pleasant Lungs: CTA th/o - no wheeze/crackles  Cardiovascular: Regular rate without murmur gallop or rub - distant HS  Abdomen: Nontender, nondistended, soft, bowel sounds+ Extremities: No significant cyanosis, or clubbing, R LE dressed w/ VAC in palce, trace edema B LE  Data Reviewed: Basic Metabolic Panel:  Recent Labs Lab 09/14/15 0228 09/15/15 0347 09/16/15 0452 09/17/15 0225 09/18/15 0356  NA 140 142 142 141 141  K 3.5 3.8 5.0 3.9 4.8  CL 99* 99* 98* 96* 96*  CO2 30 32 37* 36* 34*  GLUCOSE 129* 107* 112* 92 103*  BUN 26* 31* 29* 26* 27*  CREATININE 1.11* 1.21* 1.03* 1.12* 1.09*  CALCIUM 8.4* 8.1* 8.0* 8.1* 8.4*    CBC:  Recent Labs Lab 09/11/15 1527 09/11/15 1944  09/15/15 0600 09/15/15 1924 09/16/15 0452 09/17/15 0225 09/18/15 0356  WBC 11.5* 10.5  < > 16.9* 17.0* 17.4* 17.5* 15.8*  NEUTROABS 9.9* 10.1*  --   --   --   --   --   --   HGB 8.9* 8.4*  < > 6.6* 8.4* 8.1* 8.1* 7.8*  HCT 32.0* 30.8*  < > 23.2* 28.1* 26.7* 27.3* 27.7*  MCV 80.8 80.6  < > 80.6 80.1 80.2 81.5 83.4  PLT 335 272  < > 203 222 190 225 232  < > = values in this interval not displayed.  Liver Function Tests:  Recent Labs Lab 09/16/15 0452  AST 24  ALT 24  ALKPHOS 103  BILITOT 0.9  PROT 5.5*  ALBUMIN 2.7*   Coags:  Recent Labs Lab 09/11/15 1527  INR 2.11*    Recent Labs Lab 09/11/15 2120  APTT 27    Cardiac Enzymes:  Recent Labs Lab 09/11/15 1527 09/14/15 1320 09/14/15 1753 09/14/15 2332  TROPONINI 0.04* 0.05* 0.04* 0.04*    Recent Results (from the past 240 hour(s))  Blood Culture (routine x 2)     Status: None   Collection Time: 09/11/15  3:27 PM  Result Value Ref Range Status   Specimen Description BLOOD RIGHT FOREARM  Final   Special Requests BOTTLES DRAWN AEROBIC AND ANAEROBIC 5CC  Final   Culture NO GROWTH 5 DAYS  Final   Report Status 09/16/2015  FINAL  Final  Blood Culture (routine x 2)     Status: None   Collection Time: 09/11/15  7:44 PM  Result Value Ref Range Status   Specimen Description BLOOD RIGHT HAND  Final   Special Requests BOTTLES DRAWN AEROBIC ONLY [place]  Final   Culture NO GROWTH 5 DAYS  Final   Report Status 09/16/2015 FINAL  Final  MRSA PCR Screening     Status: None   Collection Time: 09/11/15  8:23 PM  Result Value Ref Range Status   MRSA by PCR NEGATIVE NEGATIVE Final  Comment:        The GeneXpert MRSA Assay (FDA approved for NASAL specimens only), is one component of a comprehensive MRSA colonization surveillance program. It is not intended to diagnose MRSA infection nor to guide or monitor treatment for MRSA infections.   Urine culture     Status: None   Collection Time: 09/11/15  9:48 PM  Result Value Ref Range Status   Specimen Description URINE, CLEAN CATCH  Final   Special Requests NONE  Final   Culture MULTIPLE SPECIES PRESENT, SUGGEST RECOLLECTION  Final   Report Status 09/13/2015 FINAL  Final     Studies:   Recent x-ray studies have been reviewed in detail by the Attending Physician  Scheduled Meds:  Scheduled Meds: . antiseptic oral rinse  7 mL Mouth Rinse BID  . arformoterol  15 mcg Nebulization BID  . atorvastatin  10 mg Oral QHS  . budesonide  0.25 mg Nebulization BID  . cloNIDine  0.1 mg Oral BID  . diltiazem  120 mg Oral BID  . furosemide  40 mg Oral BID  . ipratropium  0.5 mg Nebulization QID  . levalbuterol  0.63 mg Nebulization QID  . levothyroxine  100 mcg Oral QAC breakfast  . potassium chloride  20 mEq Oral BID  . predniSONE  20 mg Oral Q breakfast  . sodium chloride  3 mL Intravenous Q12H    Time spent on care of this patient: 35 mins   Kaileia Flow T , MD   [facility] Office  [phone] Pager - Text Page per Shea Evans as per below:  On-Call/Text Page:      [email or web address]      password [email or web address]  If 7PM-7AM, please contact  night-coverage [email or web address] Password [email or web address] 09/18/2015, 2:54 PM   LOS: 7 days

## 2015-09-18 NOTE — Consult Note (Addendum)
WOC wound consult note Dr Ninfa Linden of the ortho service following for RLE post-op wound and Vac dressing; please re-consult if further assistance is desired for this location. Reason for Consult: Consult requested for right heel Wound type: Unstageable pressure injury; present on admission Pressure Ulcer POA: Yes Measurement: 2X1cm, dry yellow wound bed with callous over site which removes easily, revealing pink dry scar tissue Drainage (amount, consistency, odor) No odor or drainage Periwound: Generalized erythremia surrounding site Dressing procedure/placement/frequency: Float heel in Prevalon boot to reduce pressure to affected area; this is already in place.  No further topical treatment needed at this time. Please re-consult if further assistance is needed.  Thank-you,  Julien Girt MSN, [place], [place], [place], [place]

## 2015-09-18 NOTE — Anesthesia Postprocedure Evaluation (Signed)
Anesthesia Post Note  Patient: Caitlin Hampton  Procedure(s) Performed: Procedure(s) (LRB): IRRIGATION AND DEBRIDEMENT RIGHT LOWER LEG. (Right) APPLICATION OF WOUND VAC RIGHT LOWER LEG (Right)  Patient location during evaluation: PACU Anesthesia Type: General Level of consciousness: awake and alert Pain management: pain level controlled Vital Signs Assessment: post-procedure vital signs reviewed and stable Respiratory status: spontaneous breathing, nonlabored ventilation, respiratory function stable and patient connected to nasal cannula oxygen Cardiovascular status: blood pressure returned to baseline and stable Postop Assessment: no signs of nausea or vomiting Anesthetic complications: no Comments: Will need breathing treatments continued with RT, incentive spirometer    Last Vitals:  Filed Vitals:   09/18/15 0808 09/18/15 0815  BP: 123/72 113/86  Pulse: 98 94  Temp: 36.5 C   Resp: 19 21    Last Pain:  Filed Vitals:   09/18/15 0829  PainSc: Asleep                 Zenaida Deed

## 2015-09-18 NOTE — Op Note (Signed)
Caitlin Hampton, Caitlin Hampton                 ACCOUNT NO.:  [id number]  MEDICAL RECORD NO.:  [id number]  LOCATION:  [facility]                         FACILITY:  [facility]  PHYSICIAN:  Lind Guest. Ninfa Linden, M.D.DATE OF BIRTH:  Aug 28, 1946  DATE OF PROCEDURE:  09/18/2015 DATE OF DISCHARGE:                              OPERATIVE REPORT   PREOPERATIVE DIAGNOSIS:  Right leg wound status post contusion with hematoma on a patient on chronic anticoagulation with wound measuring 13 cm x 8 cm on right anterolateral shin.  POSTOPERATIVE DIAGNOSIS:  Right leg wound status post contusion with hematoma on a patient on chronic anticoagulation with wound measuring 13 cm x 8 cm on right anterolateral shin.  PROCEDURE: 1. Irrigation and debridement of right leg wound measuring 8 x 13 cm     with sharp excisional debridement of only necrotic skin. 2. Placement of medium VAC  sponge.  SURGEON:  Lind Guest. Ninfa Linden, M.D.  ANESTHESIA:  General via LMA.  BLOOD LOSS:  Minimal.  COMPLICATIONS:  None.  ANTIBIOTICS:  2 g IV Ancef.  INDICATIONS:  [name] is a 69 year old female on chronic anticoagulation who last week sustained a contusion to her right leg. This obviously disrupted some type of vein and being on blood thinning medications, she developed a very large hematoma.  On Thursday of last week, we had evacuated the hematoma at the bedside, but she was too sick to bring her to the operating room, we have been treating this with wet to dry dressings.  The wound was very large and we recommended debridement in operative setting.  Due to her sensitivities and placement of VAC sponge which I did, it will end up needing to perform skin grafting at a later date.  The risks and benefits of surgery were explained to her in detail and her sister do wish to proceed with surgery.  PROCEDURE DESCRIPTION:  After informed consent was obtained, appropriate right leg was marked, she was brought to the operating room  and placed supine on the operating table.  General anesthesia was obtained.  Her right leg was prepped and draped with just Betadine from the thigh down the toes.  A time-out was called and she was identified as the correct patient, correct right leg.  We then measured the wound and it again measured 8 x 13 cm.  We then used a #10 blade and excised the margins of necrotic skin.  We then used pulsatile lavage to irrigate the wound with 3 L normal saline solution.  We used electrocautery to try to coagulate any bleeding.  I then placed a medium Hemovac sponge over the wound and I then was able to obtain a good seal at -125 mm continuous suction. A well-padded sterile dressing was applied to this.  She was awakened, extubated and taken to the recovery room in stable condition.  All final counts were correct.  There were no complications noted. Postoperatively, we will leave this VAC on for several days before returning to the operating room for a skin grafting procedure.     Lind Guest. Ninfa Linden, M.D.     CYB/MEDQ  D:  09/18/2015  T:  09/18/2015  Job:  [id number] 

## 2015-09-18 NOTE — Anesthesia Postprocedure Evaluation (Signed)
Anesthesia Post Note  Patient: Caitlin Hampton  Procedure(s) Performed: Procedure(s) (LRB): IRRIGATION AND DEBRIDEMENT RIGHT LOWER LEG. (Right) APPLICATION OF WOUND VAC RIGHT LOWER LEG (Right)  Anesthesia Post Evaluation  Last Vitals:  Filed Vitals:   09/18/15 0808 09/18/15 0815  BP: 123/72 113/86  Pulse: 98 94  Temp: 36.5 C   Resp: 19 21    Last Pain:  Filed Vitals:   09/18/15 0829  PainSc: Asleep                 Naraya Stoneberg

## 2015-09-18 NOTE — Progress Notes (Addendum)
Received Caitlin Hampton to room 3E17 from [facility].  Patient alert and oriented x 4.  VAC dressing intact to right lower leg with ace wrap over wound vac.  02 at 4L per nasal cannula.  Left heel elevated on pillow.  Skin intact with ecchymotic areas to left side, including back down to leg above dressing.  CCMD called with 2 person verification - patient on box 28,  Skin warm and dry.  VSs stable.  Prevalon boot on right foot.

## 2015-09-19 ENCOUNTER — Encounter (HOSPITAL_COMMUNITY): Payer: Self-pay | Admitting: Orthopaedic Surgery

## 2015-09-19 LAB — COMPREHENSIVE METABOLIC PANEL
ALT: 28 U/L (ref 14–54)
ANION GAP: 9 (ref 5–15)
AST: 26 U/L (ref 15–41)
Albumin: 2.7 g/dL — ABNORMAL LOW (ref 3.5–5.0)
Alkaline Phosphatase: 76 U/L (ref 38–126)
BUN: 32 mg/dL — ABNORMAL HIGH (ref 6–20)
CHLORIDE: 94 mmol/L — AB (ref 101–111)
CO2: 36 mmol/L — AB (ref 22–32)
Calcium: 8.6 mg/dL — ABNORMAL LOW (ref 8.9–10.3)
Creatinine, Ser: 1.35 mg/dL — ABNORMAL HIGH (ref 0.44–1.00)
GFR calc non Af Amer: 39 mL/min — ABNORMAL LOW (ref >60)
GFR, EST AFRICAN AMERICAN: 45 mL/min — AB (ref >60)
Glucose, Bld: 125 mg/dL — ABNORMAL HIGH (ref 65–99)
Potassium: 5.8 mmol/L — ABNORMAL HIGH (ref 3.5–5.1)
SODIUM: 139 mmol/L (ref 135–145)
Total Bilirubin: 0.8 mg/dL (ref 0.3–1.2)
Total Protein: 5.3 g/dL — ABNORMAL LOW (ref 6.5–8.1)

## 2015-09-19 LAB — CBC
HCT: 23.7 % — ABNORMAL LOW (ref 36.0–46.0)
Hemoglobin: 6.3 g/dL — CL (ref 12.0–15.0)
MCH: 22.3 pg — AB (ref 26.0–34.0)
MCHC: 26.6 g/dL — AB (ref 30.0–36.0)
MCV: 84 fL (ref 78.0–100.0)
PLATELETS: 238 10*3/uL (ref 150–400)
RBC: 2.82 MIL/uL — ABNORMAL LOW (ref 3.87–5.11)
RDW: 19.6 % — AB (ref 11.5–15.5)
WBC: 14.5 10*3/uL — ABNORMAL HIGH (ref 4.0–10.5)

## 2015-09-19 LAB — IRON AND TIBC
IRON: 30 ug/dL (ref 28–170)
SATURATION RATIOS: 8 % — AB (ref 10.4–31.8)
TIBC: 367 ug/dL (ref 250–450)
UIBC: 337 ug/dL

## 2015-09-19 LAB — FOLATE: FOLATE: 7.9 ng/mL (ref >5.9)

## 2015-09-19 LAB — GLUCOSE, CAPILLARY
GLUCOSE-CAPILLARY: 126 mg/dL — AB (ref 65–99)
GLUCOSE-CAPILLARY: 132 mg/dL — AB (ref 65–99)
Glucose-Capillary: 152 mg/dL — ABNORMAL HIGH (ref 65–99)

## 2015-09-19 LAB — RETICULOCYTES
RBC.: 3 MIL/uL — ABNORMAL LOW (ref 3.87–5.11)
RETIC COUNT ABSOLUTE: 105 10*3/uL (ref 19.0–186.0)
Retic Ct Pct: 3.5 % — ABNORMAL HIGH (ref 0.4–3.1)

## 2015-09-19 LAB — HEMOGLOBIN AND HEMATOCRIT, BLOOD
HEMATOCRIT: 29.7 % — AB (ref 36.0–46.0)
Hemoglobin: 8.8 g/dL — ABNORMAL LOW (ref 12.0–15.0)

## 2015-09-19 LAB — PREPARE RBC (CROSSMATCH)

## 2015-09-19 LAB — VITAMIN B12: Vitamin B-12: 330 pg/mL (ref 180–914)

## 2015-09-19 LAB — POTASSIUM: Potassium: 5.9 mmol/L — ABNORMAL HIGH (ref 3.5–5.1)

## 2015-09-19 LAB — FERRITIN: Ferritin: 60 ng/mL (ref 11–307)

## 2015-09-19 MED ORDER — FUROSEMIDE 10 MG/ML IJ SOLN
20.0000 mg | Freq: Once | INTRAMUSCULAR | Status: AC
  Administered 2015-09-19: 20 mg via INTRAVENOUS
  Filled 2015-09-19: qty 2

## 2015-09-19 MED ORDER — SODIUM POLYSTYRENE SULFONATE 15 GM/60ML PO SUSP
45.0000 g | Freq: Once | ORAL | Status: AC
  Filled 2015-09-19: qty 180

## 2015-09-19 MED ORDER — SODIUM CHLORIDE 0.9 % IV SOLN
Freq: Once | INTRAVENOUS | Status: AC
  Administered 2015-09-19: 13:00:00 via INTRAVENOUS

## 2015-09-19 MED ORDER — DIPHENHYDRAMINE HCL 25 MG PO CAPS
25.0000 mg | ORAL_CAPSULE | Freq: Once | ORAL | Status: AC
  Administered 2015-09-19: 25 mg via ORAL
  Filled 2015-09-19: qty 1

## 2015-09-19 MED ORDER — ACETAMINOPHEN 325 MG PO TABS
650.0000 mg | ORAL_TABLET | Freq: Once | ORAL | Status: AC
  Administered 2015-09-19: 650 mg via ORAL
  Filled 2015-09-19: qty 2

## 2015-09-19 MED ORDER — SODIUM POLYSTYRENE SULFONATE 15 GM/60ML PO SUSP
45.0000 g | Freq: Once | ORAL | Status: DC
  Administered 2015-09-19: 45 g via ORAL
  Filled 2015-09-19: qty 180

## 2015-09-19 NOTE — Progress Notes (Addendum)
[facility] PROGRESS NOTE  Caitlin Hampton OBS:[id number] DOB: 31-Mar-1946 DOA: 09/11/2015 PCP: [name], MD  Brief interval history Patient is an 69 year old female past oral history of COPD on chronic 3 L oxygen, atrial fibrillation on chronic anticoagulation and CAD who had been having for the past 1 week increased dyspnea on exertion and nonproductive cough, but came to the emergency room on 12/19 after she was headed to see her PCP, but fell hitting her left leg which led to a large hematoma. In the emergency room, patient was found to have mild cytosis, lactic acidosis and acute respiratory failure requiring 5-6 L of oxygen. Was suspected the patient had a pneumonia causing sepsis. Chest x-ray was not very revealing, likely from dehydration. Patient started on IV antibiotics and treated with sepsis protocol with aggressive fluid resuscitation. UA came back concerning urinary tract infection. Urine cultures negative.   Patient of the last few days has slowly continued to improve.  09/13/2015 patient, needing 4-5 liters of oxygen nasal cannula she herself feels good. No complaints other than some mild right leg pain.   On 09/14/2015 patient noted to be in acute on chronic respiratory distress on a Venturi mask with sats in the high 80s to low 90s. Repeat chest x-ray done consistent with volume overload. Patient also noted to have a significantly enlarged hematoma on the right lower extremity. Patient is being diuresed and orthopedics, Dr Ninfa Linden was consulted for evacuation of right lower extremity hematoma. Patient went to OR 09/18/2015 for I and D of her right lower extremity hematoma.   Assessment/Plan: #1 acute on chronic respiratory failure Likely multifactorial largely in part secondary to acute on chronic diastolic heart failure in the setting of probable COPD exacerbation and probable community-acquired pneumonia. Patient with clinical improvement. Patient noted to desat early  this morning to the mid 70s on transferring on 3 L nasal cannula with improvement with oxygenation with increased to 5 L nasal cannula. Cardiac enzymes negative 3. 2-D echo with EF of 55-60% with no wall margin abnormalities and severely elevated pulmonary pressures. Patient status post full course of antibiotics. Continue oral Lasix, steroid taper, nebulizers.  #2 sepsis On admission it was felt patient met criteria for sepsis given lactic acidosis, tachypnea, tachycardia, leukocytosis and felt to likely be secondary to a pulmonary source and possible urinary tract infection. Patient is currently afebrile. Patient's pulmonary status complicated by volume overload. Continue oral steroid taper, nebulizer treatments, s/p course of antibiotics.  #3 probable community-acquired pneumonia Per admission. Patient currently afebrile. Patient also with a leukocytosis of also on steroids. Patient status post full course of antibiotic treatment.  #4 acute on chronic diastolic heart failure Patient currently volume overload. Cardiac enzymes negative 3. TSH within normal limits. 2-D echo with EF of 55-60% with no wall motion abnormalities. Severely elevated pulmonary pressure. Continue oral Lasix, Lipitor, Cardizem.  #5 bacteria in the urine On admission was some concerns that patient may have a urinary tract infection. Urine cultures with multiple bacterial morphotypes. Follow.  #6 leukocytosis Likely secondary to problems #2, 3 and steroids. Urine cultures negative. Blood cultures negative. Patient status post antibiotics. Patient on a steroid taper. Follow.  #7 hematoma of the right lower extremity Patient with significant swelling on right lower extremity hematoma. Patient also on chronic anticoagulation. Orthopedics has been consulted and patient was seen in consultation by Dr. Ninfa Linden who was able to use a #10 blade at the bedside to remove necrotic skin and evacuated a very large hematoma and wound  packed with wet-to-dry dressing. Patient status post I and D right leg wound measuring 8 x 13 cm with placement of medium VAC sponge in the operating room on 09/18/2015. Per orthopedics.  #8 acute blood loss anemia Likely secondary to hematoma. Patient's hemoglobin 6.3. Transfuse 2 units packed red blood cells. Hold anticoagulation.  #9 hypothyroidism TSH is 1.984. Continue home dose Synthroid.  #10 chronic atrial fibrillation Continue Cardizem for rate control. Xarelto on hold secondary to large hematoma and anemia. Could probably resume Xarelto in the next 1-2 days if hemoglobin remains stable.   #11 hyperkalemia Discontinue potassium supplementation. Kayexalate.  #12 prophylaxis PPI for GI prophylaxis. SCDs on left lower extremity for DVT prophylaxis.  Code Status: Full Family Communication: Updated patient. No family at bedside. Disposition Plan: Hopefully to skilled nursing facility when oxygenation is at baseline and when okay with orthopedics.    Consultants:  Orthopedics: Dr. Ninfa Linden 09/14/2015    Procedures:  Chest x-ray 09/11/2015, 09/14/2015  X-ray of the tib-fib 09/11/2015  Bedside I and D and evacuation of right lower extremity hematoma 09/14/2015  2-D echo 09/14/2015  2 units packed red blood cells 09/19/2015  Antibiotics:  IV Rocephin 09/11/2015>>>> 09/15/2015  IV Azithromycin 09/11/2015>>>09/13/2015    HPI/Subjective: Patient states SOB improved. No CP.   Objective: Filed Vitals:   09/19/15 1536 09/19/15 1600  BP: 124/68 102/57  Pulse: 71 91  Temp: 98.3 F (36.8 C) 98.7 F (37.1 C)  Resp: 20 20    Intake/Output Summary (Last 24 hours) at 09/19/15 1701 Last data filed at 09/18/15 2000  Gross per 24 hour  Intake      0 ml  Output    100 ml  Net   -100 ml   Filed Weights   09/18/15 0400 09/18/15 1855 09/19/15 0536  Weight: 107.2 kg (236 lb 5.3 oz) 107.185 kg (236 lb 4.8 oz) 106.8 kg (235 lb 7.2 oz)    Exam:   General:  Sitting  in chair in no acute cardiopulmonary distress.   Cardiovascular: Irregularly irregular  Respiratory: Decvreased BS in bases. Scattered crackles. minimal wheezing. No rhonchi  Abdomen: Soft/NT/ND/+BS  Musculoskeletal: nO C/C/. 1 + Ble edema. RLE in tibial region with bandage and wound VAC on.  Data Reviewed: Basic Metabolic Panel:  Recent Labs Lab 09/15/15 0347 09/16/15 0452 09/17/15 0225 09/18/15 0356 09/19/15 0824  NA 142 142 141 141 139  K 3.8 5.0 3.9 4.8 5.8*  CL 99* 98* 96* 96* 94*  CO2 32 37* 36* 34* 36*  GLUCOSE 107* 112* 92 103* 125*  BUN 31* 29* 26* 27* 32*  CREATININE 1.21* 1.03* 1.12* 1.09* 1.35*  CALCIUM 8.1* 8.0* 8.1* 8.4* 8.6*   Liver Function Tests:  Recent Labs Lab 09/16/15 0452 09/19/15 0824  AST 24 26  ALT 24 28  ALKPHOS 103 76  BILITOT 0.9 0.8  PROT 5.5* 5.3*  ALBUMIN 2.7* 2.7*   No results for input(s): LIPASE, AMYLASE in the last 168 hours. No results for input(s): AMMONIA in the last 168 hours. CBC:  Recent Labs Lab 09/15/15 1924 09/16/15 0452 09/17/15 0225 09/18/15 0356 09/19/15 0824  WBC 17.0* 17.4* 17.5* 15.8* 14.5*  HGB 8.4* 8.1* 8.1* 7.8* 6.3*  HCT 28.1* 26.7* 27.3* 27.7* 23.7*  MCV 80.1 80.2 81.5 83.4 84.0  PLT 222 190 225 232 238   Cardiac Enzymes:  Recent Labs Lab 09/14/15 1320 09/14/15 1753 09/14/15 2332  TROPONINI 0.05* 0.04* 0.04*   BNP (last 3 results)  Recent Labs  12/11/14 1958 09/11/15  1527 09/14/15 1320  BNP 1060.6* 737.3* 658.0*    ProBNP (last 3 results) No results for input(s): PROBNP in the last 8760 hours.  CBG:  Recent Labs Lab 09/19/15 1143 09/19/15 1558  GLUCAP 152* 126*    Recent Results (from the past 240 hour(s))  Blood Culture (routine x 2)     Status: None   Collection Time: 09/11/15  3:27 PM  Result Value Ref Range Status   Specimen Description BLOOD RIGHT FOREARM  Final   Special Requests BOTTLES DRAWN AEROBIC AND ANAEROBIC 5CC  Final   Culture NO GROWTH 5 DAYS  Final    Report Status 09/16/2015 FINAL  Final  Blood Culture (routine x 2)     Status: None   Collection Time: 09/11/15  7:44 PM  Result Value Ref Range Status   Specimen Description BLOOD RIGHT HAND  Final   Special Requests BOTTLES DRAWN AEROBIC ONLY [place]  Final   Culture NO GROWTH 5 DAYS  Final   Report Status 09/16/2015 FINAL  Final  MRSA PCR Screening     Status: None   Collection Time: 09/11/15  8:23 PM  Result Value Ref Range Status   MRSA by PCR NEGATIVE NEGATIVE Final    Comment:        The GeneXpert MRSA Assay (FDA approved for NASAL specimens only), is one component of a comprehensive MRSA colonization surveillance program. It is not intended to diagnose MRSA infection nor to guide or monitor treatment for MRSA infections.   Urine culture     Status: None   Collection Time: 09/11/15  9:48 PM  Result Value Ref Range Status   Specimen Description URINE, CLEAN CATCH  Final   Special Requests NONE  Final   Culture MULTIPLE SPECIES PRESENT, SUGGEST RECOLLECTION  Final   Report Status 09/13/2015 FINAL  Final     Studies: No results found.  Scheduled Meds: . sodium chloride   Intravenous Once  . antiseptic oral rinse  7 mL Mouth Rinse BID  . arformoterol  15 mcg Nebulization BID  . atorvastatin  10 mg Oral QHS  . budesonide  0.25 mg Nebulization BID  . cloNIDine  0.1 mg Oral BID  . diltiazem  120 mg Oral BID  . furosemide  40 mg Oral BID  . ipratropium  0.5 mg Nebulization QID  . levalbuterol  0.63 mg Nebulization QID  . levothyroxine  100 mcg Oral QAC breakfast  . predniSONE  10 mg Oral Q breakfast  . sodium chloride  3 mL Intravenous Q12H   Continuous Infusions:    Principal Problem:   Acute and chronic respiratory failure with hypoxia (HCC) Active Problems:   Hypertension   Hypothyroidism   Acute on chronic diastolic CHF (congestive heart failure) (HCC)   HX: anticoagulation   Morbid obesity (HCC)   COPD exacerbation (HCC)   Sepsis ([place])   Chronic a-fib  (HCC)   UTI (urinary tract infection)   Hematoma of right lower extremity   CAP (community acquired pneumonia)   Pressure ulcer    Time spent: 67 minutes    THOMPSON,DANIEL M.D. [facility] Pager [phone]. If 7PM-7AM, please contact night-coverage at [email or web address], password [facility] 09/19/2015, 5:01 PM  LOS: 8 days

## 2015-09-19 NOTE — Care Management Important Message (Signed)
Important Message  Patient Details  Name: Caitlin Hampton MRN: [id number] Date of Birth: 17-Aug-1946   Medicare Important Message Given:  Yes    Nathen May 09/19/2015, 12:11 PM

## 2015-09-19 NOTE — Evaluation (Signed)
Occupational Therapy Evaluation Patient Details Name: ENNIFER TELLIER MRN: [id number] DOB: [birth year] Today's Date: 09/19/2015    History of Present Illness Patient is an 69 year old female past history of COPD on chronic 3 L oxygen, atrial fibrillation on chronic anticoagulation and CAD who presented to ED with increased dyspnea on exertion and nonproductive cough, and s/p fall hitting her left leg which led to a large hematoma. Patient found to have acute respiratory failure requiring 5-6 L of oxygen.  Dr. Ninfa Linden took to surgery on 09/18/15 for I&D of Right leg wound and VAC placement.   Clinical Impression   Pt was able to sponge bathe, dress and perform toileting at a modified independent level. She ambulated with a walker. Pt reports struggling with LB bathing and dressing and being fearful of attempting to shower. Pt presents with decreased activity tolerance, generalized weakness and impaired balance interfering with ability to perform at her baseline.  She is quite anxious about falling and requires 2 person assist for transfers.  Recommending ST rehab in SNF prior to return home with her sister.  Will follow acutely.    Follow Up Recommendations  SNF;Supervision/Assistance - 24 hour    Equipment Recommendations       Recommendations for Other Services       Precautions / Restrictions Precautions Precautions: Fall Precaution Comments: wound vac, L LE Restrictions Weight Bearing Restrictions: No      Mobility Bed Mobility               General bed mobility comments: pt in chair  Transfers Overall transfer level: Needs assistance Equipment used: Rolling walker (2 wheeled) Transfers: Sit to/from [facility] Sit to Stand: Mod assist;+2 physical assistance Stand pivot transfers: Mod assist;+2 physical assistance       General transfer comment: assist to rise, maintain balance and pivot    Balance Overall balance assessment: Needs  assistance Sitting-balance support: Feet supported Sitting balance-Leahy Scale: Fair Sitting balance - Comments: in recliner/on 3 in 1, holds arms of chair due to anxiety     Standing balance-Leahy Scale: Poor Standing balance comment: heavy reliance on B UEs                            ADL Overall ADL's : Needs assistance/impaired Eating/Feeding: Independent;Sitting   Grooming: Wash/dry hands;Wash/dry face;Set up;Sitting   Upper Body Bathing: Minimal assitance;Sitting   Lower Body Bathing: +2 for physical assistance;Total assistance;Sit to/from stand   Upper Body Dressing : Minimal assistance;Sitting   Lower Body Dressing: +2 for safety/equipment;Total assistance;Sit to/from stand   Toilet Transfer: +2 for physical assistance;Moderate assistance;Stand-pivot;BSC   Toileting- Clothing Manipulation and Hygiene: +2 for physical assistance;Total assistance;Sit to/from stand       Functional mobility during ADLs:  (unable to ambulate) General ADL Comments: Pt with poor activity tolerance. Currently receiving blood transfusion.     Vision     Perception     Praxis      Pertinent Vitals/Pain Pain Assessment: No/denies pain     Hand Dominance Right   Extremity/Trunk Assessment Upper Extremity Assessment Upper Extremity Assessment: Generalized weakness   Lower Extremity Assessment Lower Extremity Assessment: Defer to PT evaluation   Cervical / Trunk Assessment Cervical / Trunk Assessment: Kyphotic   Communication Communication Communication: No difficulties   Cognition Arousal/Alertness: Awake/alert Behavior During Therapy: Anxious Overall Cognitive Status: Within Functional Limits for tasks assessed  General Comments       Exercises       Shoulder Instructions      Home Living Family/patient expects to be discharged to:: Private residence Living Arrangements: Other relatives (sister) Available Help at Discharge:  Family;Available 24 hours/day (sister cannot physically assist) Type of Home: House Home Access: Stairs to enter [employer] of Steps: 1   Home Layout: One level     Bathroom Shower/Tub: Teacher, early years/pre: Standard     Home Equipment: Environmental consultant - 2 wheels;Toilet riser (oxygen)          Prior Functioning/Environment Level of Independence: Needs assistance  Gait / Transfers Assistance Needed: walked short distances with RW ADL's / Homemaking Assistance Needed: sponge bathed sitting on toilet, struggled with LB bathing and dressing, often avoiding, sister helped with meal prep and did all housekeeping   Comments: used RW when ambulating, for past week and a half was requiring help for mobility, at basline is independent with sponge bath, 3 falls in past 3 months    OT Diagnosis: Generalized weakness   OT Problem List: Decreased strength;Decreased activity tolerance;Impaired balance (sitting and/or standing);Decreased safety awareness;Decreased knowledge of use of DME or AE;Cardiopulmonary status limiting activity;Obesity   OT Treatment/Interventions: Self-care/ADL training;DME and/or AE instruction;Patient/family education;Balance training;Therapeutic activities;Therapeutic exercise    OT Goals(Current goals can be found in the care plan section) Acute Rehab OT Goals Patient Stated Goal: to get better OT Goal Formulation: With patient Time For Goal Achievement: 10/03/15 Potential to Achieve Goals: Good ADL Goals Pt Will Perform Upper Body Bathing: with set-up;sitting;with adaptive equipment Pt Will Perform Upper Body Dressing: with set-up;sitting Pt Will Transfer to Toilet: with mod assist;stand pivot transfer;bedside commode Pt/caregiver will Perform Home Exercise Program: Increased strength;Both right and left upper extremity (AROM ) Additional ADL Goal #1: Pt will sit EOB x 15 minutes while engaged in ADL or exercise with UB.  OT Frequency: Min  2X/week   Barriers to D/C: Decreased caregiver support          Co-evaluation              End of Session Equipment Utilized During Treatment: Gait belt;Rolling walker;Oxygen  Activity Tolerance: Patient limited by fatigue Patient left: in chair;with call bell/phone within reach;with chair alarm set   Time: 1341-1401 OT Time Calculation (min): 20 min Charges:  OT General Charges $OT Visit: 1 Procedure OT Evaluation $Initial OT Evaluation Tier I: 1 Procedure G-Codes:    Malka So 09/19/2015, 3:03 PM [phone]

## 2015-09-19 NOTE — Progress Notes (Signed)
Called to room by PT for desat after transferring from bed to chair - 02 down to mid-70s.  Oxygen was @ 3L per nasal cannula.  02 increased to 4L then 5L and 02 sat rose to 89-90%.  Patient had no labored breathing during this time and has been conversing with staff and visitors.

## 2015-09-19 NOTE — Progress Notes (Signed)
Patient ID: Caitlin Hampton, female   DOB: 1946-02-22, 69 y.o.   MRN: [id number] No acute changes.  VAC over right leg wound to suction with good seal.  She can put full weight on that leg from my standpoint.  She does need a split-thickness skin graft at this point.  Will leave the vac on for a day or 2 more before getting her on the schedule for a STSG.

## 2015-09-19 NOTE — Progress Notes (Signed)
Physical Therapy Treatment Patient Details Name: LENNIX LAMINACK MRN: [id number] DOB: [birth year] Today's Date: 09/19/2015    History of Present Illness Patient is an 69 year old female past history of COPD on chronic 3 L oxygen, atrial fibrillation on chronic anticoagulation and CAD who presented to ED with increased dyspnea on exertion and nonproductive cough, and s/p fall hitting her left leg which led to a large hematoma. Patient found to have acute respiratory failure requiring 5-6 L of oxygen.  Dr. Ninfa Linden took to surgery on 09/18/15 for I&D of Right leg wound and VAC placement.    PT Comments    Patient did better today with mobility.  Patient limited by anxiety, balance, cardiopulm status and weakness.  Patient continues to present with dependencies in mobility and will benefit from continued PT to progress mobility within cardiopulm status and strive toward more independence.  Agree patient will need SNF for continued progression.     Follow Up Recommendations  SNF     Equipment Recommendations  None recommended by PT    Recommendations for Other Services       Precautions / Restrictions Precautions Precautions: Fall Restrictions Weight Bearing Restrictions: No    Mobility  Bed Mobility Overal bed mobility: Needs Assistance Bed Mobility: Supine to Sit     Supine to sit: Min assist     General bed mobility comments: Pt needed assist for LEs and elevation of trunk.  Transfers Overall transfer level: Needs assistance Equipment used: Rolling walker (2 wheeled) Transfers: Sit to/from [facility] Sit to Stand: Mod assist;+2 physical assistance Stand pivot transfers: Mod assist;+2 physical assistance       General transfer comment: facilitation to power up and maintain balance in standing; cueing and encouragement for sequencing and technique.  Took pivotal steps to 3in1 and then recliner.  Patient did desat to 70% and did not recover after 5 minutes,  notified RN and RN to room.  Ambulation/Gait                 Stairs            Wheelchair Mobility    Modified Rankin (Stroke Patients Only)       Balance Overall balance assessment: Needs assistance Sitting-balance support: Bilateral upper extremity supported Sitting balance-Leahy Scale: Poor Sitting balance - Comments: patient holding onto rail for balance   Standing balance support: Bilateral upper extremity supported Standing balance-Leahy Scale: Poor Standing balance comment: required RW for balance/support                    Cognition Arousal/Alertness: Awake/alert Behavior During Therapy: Anxious Overall Cognitive Status: Within Functional Limits for tasks assessed                      Exercises      General Comments        Pertinent Vitals/Pain Pain Assessment: No/denies pain    Home Living                      Prior Function            PT Goals (current goals can now be found in the care plan section) Progress towards PT goals: Progressing toward goals    Frequency  Min 3X/week    PT Plan Current plan remains appropriate    Co-evaluation             End of Session Equipment Utilized During Treatment: Gait belt;Oxygen  Activity Tolerance: Patient limited by fatigue;Treatment limited secondary to medical complications (Comment) Patient left: in chair;with call bell/phone within reach;with chair alarm set;with family/visitor present;with nursing/sitter in room     Time: [id number] PT Time Calculation (min) (ACUTE ONLY): 29 min  Charges:  $Therapeutic Activity: 23-37 mins                    G Codes:      Shanna Cisco 10-12-2015, 10:58 AM  12-Oct-2015 [place], [place]

## 2015-09-19 NOTE — Progress Notes (Signed)
Physical Therapy Treatment Patient Details Name: Caitlin Hampton MRN: [id number] DOB: [birth year] Today's Date: 09/19/2015    History of Present Illness Patient is an 69 year old female past history of COPD on chronic 3 L oxygen, atrial fibrillation on chronic anticoagulation and CAD who presented to ED with increased dyspnea on exertion and nonproductive cough, and s/p fall hitting her left leg which led to a large hematoma. Patient found to have acute respiratory failure requiring 5-6 L of oxygen.  Dr. Ninfa Linden took to surgery on 09/18/15 for I&D of Right leg wound and VAC placement.    PT Comments    Returned to help nursing get patient back to bed.  Patient still limited by Cardiopulm status and anxiety.  I anticipate slow and steady progress and anticipate patient will need continued encouragement to progress.  Follow Up Recommendations  SNF     Equipment Recommendations  None recommended by PT    Recommendations for Other Services       Precautions / Restrictions Precautions Precautions: Fall Precaution Comments: wound vac, L LE Restrictions Weight Bearing Restrictions: No    Mobility  Bed Mobility Overal bed mobility: Needs Assistance Bed Mobility: Sit to Supine;Rolling Rolling: Min assist     Sit to supine: Mod assist   General bed mobility comments: max cueing and encourgement to attempt mobility on her own versus allowing RN/PT to assist her.  Transfers Overall transfer level: Needs assistance Equipment used: Rolling walker (2 wheeled) Transfers: Sit to/from [facility] Sit to Stand: Max assist;+2 physical assistance Stand pivot transfers: Mod assist;+2 physical assistance       General transfer comment: max assist to power up from chair, maintain balance and pivot to bed.  Patient flexed at trunk and somewhta impulsive during session (trying to sit down before reaching bed)  Ambulation/Gait                 Stairs             Wheelchair Mobility    Modified Rankin (Stroke Patients Only)       Balance Overall balance assessment: Needs assistance Sitting-balance support: Feet supported Sitting balance-Leahy Scale: Fair Sitting balance - Comments: in recliner/on 3 in 1, holds arms of chair due to anxiety     Standing balance-Leahy Scale: Poor Standing balance comment: heavy reliance on B UEs                    Cognition Arousal/Alertness: Awake/alert Behavior During Therapy: Impulsive Overall Cognitive Status: Within Functional Limits for tasks assessed Area of Impairment: Safety/judgement               General Comments: Patient often asking "where should I put my hands" or "how am I going to do that" - difficult to tell if this is cognitive in nature or just anxiety.    Exercises      General Comments        Pertinent Vitals/Pain Pain Assessment: No/denies pain    Home Living Family/patient expects to be discharged to:: Private residence Living Arrangements: Other relatives (sister) Available Help at Discharge: Family;Available 24 hours/day (sister cannot physically assist) Type of Home: House Home Access: Stairs to enter   Home Layout: One level Home Equipment: Environmental consultant - 2 wheels;Toilet riser (oxygen)      Prior Function Level of Independence: Needs assistance  Gait / Transfers Assistance Needed: walked short distances with RW ADL's / Homemaking Assistance Needed: sponge bathed sitting on toilet, struggled with LB  bathing and dressing, often avoiding, sister helped with meal prep and did all housekeeping Comments: used RW when ambulating, for past week and a half was requiring help for mobility, at basline is independent with sponge bath, 3 falls in past 3 months   PT Goals (current goals can now be found in the care plan section) Acute Rehab PT Goals Patient Stated Goal: to get better Progress towards PT goals: Progressing toward goals    Frequency  Min 3X/week     PT Plan Current plan remains appropriate    Co-evaluation             End of Session Equipment Utilized During Treatment: Gait belt;Oxygen Activity Tolerance: Patient limited by fatigue;Treatment limited secondary to medical complications (Comment) Patient left: in bed;with nursing/sitter in room     Time: [id number] PT Time Calculation (min) (ACUTE ONLY): 10 min  Charges:  $Therapeutic Activity: 8-22 mins                    G Codes:      Shanna Cisco 10-11-2015, 3:06 PM  Oct 11, 2015 [place], [place]

## 2015-09-19 NOTE — Progress Notes (Signed)
CSW informed that pt possiblit ready for DC to [facility] tomorrow (12/28)- CSW updated facility and made sure they can accommodate the wound vac- facility confirmed they can take pt tomorrow with wound vac.  [employer] Medicare auth initiated.  CSW will continue to follow  Domenica Reamer, [place] Social Worker [phone]

## 2015-09-20 LAB — BASIC METABOLIC PANEL
Anion gap: 10 (ref 5–15)
BUN: 39 mg/dL — AB (ref 6–20)
CHLORIDE: 93 mmol/L — AB (ref 101–111)
CO2: 34 mmol/L — ABNORMAL HIGH (ref 22–32)
CREATININE: 1.47 mg/dL — AB (ref 0.44–1.00)
Calcium: 8.2 mg/dL — ABNORMAL LOW (ref 8.9–10.3)
GFR calc Af Amer: 41 mL/min — ABNORMAL LOW (ref >60)
GFR calc non Af Amer: 35 mL/min — ABNORMAL LOW (ref >60)
GLUCOSE: 119 mg/dL — AB (ref 65–99)
POTASSIUM: 5.1 mmol/L (ref 3.5–5.1)
SODIUM: 137 mmol/L (ref 135–145)

## 2015-09-20 LAB — TYPE AND SCREEN
ABO/RH(D): O NEG
Antibody Screen: NEGATIVE
UNIT DIVISION: 0
Unit division: 0

## 2015-09-20 LAB — CBC
HCT: 28.5 % — ABNORMAL LOW (ref 36.0–46.0)
Hemoglobin: 8.5 g/dL — ABNORMAL LOW (ref 12.0–15.0)
MCH: 25.3 pg — ABNORMAL LOW (ref 26.0–34.0)
MCHC: 29.8 g/dL — ABNORMAL LOW (ref 30.0–36.0)
MCV: 84.8 fL (ref 78.0–100.0)
PLATELETS: 237 10*3/uL (ref 150–400)
RBC: 3.36 MIL/uL — AB (ref 3.87–5.11)
RDW: 18.8 % — AB (ref 11.5–15.5)
WBC: 15.9 10*3/uL — AB (ref 4.0–10.5)

## 2015-09-20 LAB — GLUCOSE, CAPILLARY: GLUCOSE-CAPILLARY: 99 mg/dL (ref 65–99)

## 2015-09-20 NOTE — Progress Notes (Signed)
Patient transfer report received; current plan is d/c to [facility] when medically stable.MD: please confirm if patient will need a wound vac at SNF and please estimate d/c date as prior auth is required for placement with [facility]. SNF will also need to order a wound vac if needed at SNF.  Many thanks!  Lorie Phenix. Pauline Good, [place]

## 2015-09-20 NOTE — Progress Notes (Signed)
PROGRESS NOTE  Caitlin Hampton MWU:[id number] DOB: [birth year] DOA: 09/11/2015 PCP: [name], MD  HPI / Brief interval history Patient is an 68 year old female past oral history of COPD on chronic 3 L oxygen, atrial fibrillation on chronic anticoagulation and CAD who had been having for the past 1 week increased dyspnea on exertion and nonproductive cough, but came to the emergency room on 12/19 after she was headed to see her PCP, but fell hitting her left leg which led to a large hematoma. In the emergency room, patient was found to have mild cytosis, lactic acidosis and acute respiratory failure requiring 5-6 L of oxygen. Was suspected the patient had a pneumonia causing sepsis. Chest x-ray was not very revealing, likely from dehydration. Patient started on IV antibiotics and treated with sepsis protocol with aggressive fluid resuscitation. UA came back concerning urinary tract infection. Urine cultures negative.   Patient of the last few days has slowly continued to improve.  09/13/2015 patient, needing 4-5 liters of oxygen nasal cannula she herself feels good. No complaints other than some mild right leg pain.   On 09/14/2015 patient noted to be in acute on chronic respiratory distress on a Venturi mask with sats in the high 80s to low 90s. Repeat chest x-ray done consistent with volume overload. Patient also noted to have a significantly enlarged hematoma on the right lower extremity. Patient is being diuresed and orthopedics, Dr Ninfa Linden was consulted for evacuation of right lower extremity hematoma. Patient went to OR 09/18/2015 for I and D of her right lower extremity hematoma.  Subjective: - Denies any shortness of breath, has little to no recollection over the last couple days  Assessment/Plan: Acute on chronic respiratory failure - Likely multifactorial largely in part secondary to acute on chronic diastolic heart failure in the setting of probable COPD exacerbation and probable  community-acquired pneumonia.  - Patient with clinical improvement.  - Cardiac enzymes negative 3. 2-D echo with EF of 55-60% with no wall motion abnormalities and severely elevated pulmonary pressures.  - Patient status post full course of antibiotics. Continue oral Lasix, steroid taper, nebulizers.  Sepsis - On admission it was felt patient met criteria for sepsis given lactic acidosis, tachypnea, tachycardia, leukocytosis and felt to likely be secondary to a pulmonary source and possible urinary tract infection. Urine culture has remained negative - Patient is currently afebrile. Patient's pulmonary status complicated by volume overload.  - Continue oral steroid taper, nebulizer treatments, s/p course of antibiotics. - Leukocytosis is likely due to steroids  Probable community-acquired pneumonia - Per admission. Patient currently afebrile. Patient also with a leukocytosis of also on steroids. Patient status post full course of antibiotic treatment.  Acute on chronic diastolic heart failure - Patient currently volume overload, improving.  - Cardiac enzymes negative 3. TSH within normal limits. 2-D echo with EF of 55-60% with no wall motion abnormalities. Severely elevated pulmonary pressure. Continue oral Lasix, Lipitor, Cardizem. - Overall she appears to have a positive balance, however urine output is not recorded, she seems to have lost about 9 pounds since admission - Continue Lasix 40 mg twice daily  Hematoma of the right lower extremity - Patient with significant swelling on right lower extremity hematoma. Patient also on chronic anticoagulation. Orthopedics has been consulted and patient was seen in consultation by Dr. Ninfa Linden who was able to use a #10 blade at the bedside to remove necrotic skin and evacuated a very large hematoma and wound packed with wet-to-dry dressing. Patient status post  I and D right leg wound measuring 8 x 13 cm with placement of medium VAC sponge in the  operating room on 09/18/2015. Per orthopedics. - Would favor holding her anticoagulation for now, may be started in a week or 2 after discharge if her wound looks improved  Acute blood loss anemia Likely secondary to hematoma. Patient's hemoglobin 6.3. Transfuse 2 units packed red blood cells. Hold anticoagulation.  Hypothyroidism - TSH is 1.984. Continue home dose Synthroid.  Chronic atrial fibrillation - Continue Cardizem for rate control. Xarelto on hold secondary to large hematoma and anemia. Could probably resume Xarelto  As an outpatient  Hyperkalemia Discontinue potassium supplementation. Kayexalate.  Code Status: Full Family Communication: Updated patient. No family at bedside. Disposition Plan: Hopefully to skilled nursing facility when oxygenation is at baseline and when okay with orthopedics.   Consultants:  Orthopedics: Dr. Ninfa Linden 09/14/2015  Procedures:  Chest x-ray 09/11/2015, 09/14/2015  X-ray of the tib-fib 09/11/2015  Bedside I and D and evacuation of right lower extremity hematoma 09/14/2015  2-D echo 09/14/2015  2 units packed red blood cells 09/19/2015  Antibiotics:  IV Rocephin 09/11/2015>>>> 09/15/2015  IV Azithromycin 09/11/2015>>>09/13/2015   Objective: Filed Vitals:   09/19/15 2111 09/20/15 0415  BP: 113/79 124/76  Pulse: 85 87  Temp: 98.7 F (37.1 C) 98.5 F (36.9 C)  Resp: 22 20    Intake/Output Summary (Last 24 hours) at 09/20/15 1354 Last data filed at 09/20/15 1100  Gross per 24 hour  Intake 1224.5 ml  Output    400 ml  Net  824.5 ml   Filed Weights   09/19/15 0536 09/20/15 0415 09/20/15 0834  Weight: 106.8 kg (235 lb 7.2 oz) 106.9 kg (235 lb 10.8 oz) 107 kg (235 lb 14.3 oz)    Exam:  General:  Sitting in chair in no acute cardiopulmonary distress.   Cardiovascular: Irregularly irregular  Respiratory: Decvreased BS in bases. Scattered crackles. minimal wheezing. No rhonchi  Abdomen:  Soft/NT/ND/+BS  Musculoskeletal: nO C/C/. 1 + Ble edema. RLE in tibial region with bandage and wound VAC on.  Neuro: non focal    Data Reviewed: Basic Metabolic Panel:  Recent Labs Lab 09/16/15 0452 09/17/15 0225 09/18/15 0356 09/19/15 0824 09/19/15 1824 09/20/15 0354  NA 142 141 141 139  --  137  K 5.0 3.9 4.8 5.8* 5.9* 5.1  CL 98* 96* 96* 94*  --  93*  CO2 37* 36* 34* 36*  --  34*  GLUCOSE 112* 92 103* 125*  --  119*  BUN 29* 26* 27* 32*  --  39*  CREATININE 1.03* 1.12* 1.09* 1.35*  --  1.47*  CALCIUM 8.0* 8.1* 8.4* 8.6*  --  8.2*   Liver Function Tests:  Recent Labs Lab 09/16/15 0452 09/19/15 0824  AST 24 26  ALT 24 28  ALKPHOS 103 76  BILITOT 0.9 0.8  PROT 5.5* 5.3*  ALBUMIN 2.7* 2.7*   CBC:  Recent Labs Lab 09/16/15 0452 09/17/15 0225 09/18/15 0356 09/19/15 0824 09/19/15 2010 09/20/15 0354  WBC 17.4* 17.5* 15.8* 14.5*  --  15.9*  HGB 8.1* 8.1* 7.8* 6.3* 8.8* 8.5*  HCT 26.7* 27.3* 27.7* 23.7* 29.7* 28.5*  MCV 80.2 81.5 83.4 84.0  --  84.8  PLT 190 225 232 238  --  237   Cardiac Enzymes:  Recent Labs Lab 09/14/15 1320 09/14/15 1753 09/14/15 2332  TROPONINI 0.05* 0.04* 0.04*   BNP (last 3 results)  Recent Labs  12/11/14 1958 09/11/15 1527 09/14/15 1320  BNP 1060.6* 737.3* 658.0*   CBG:  Recent Labs Lab 09/19/15 1143 09/19/15 1558 09/19/15 2119 09/20/15 0704  GLUCAP 152* 126* 132* 99    Recent Results (from the past 240 hour(s))  Blood Culture (routine x 2)     Status: None   Collection Time: 09/11/15  3:27 PM  Result Value Ref Range Status   Specimen Description BLOOD RIGHT FOREARM  Final   Special Requests BOTTLES DRAWN AEROBIC AND ANAEROBIC 5CC  Final   Culture NO GROWTH 5 DAYS  Final   Report Status 09/16/2015 FINAL  Final  Blood Culture (routine x 2)     Status: None   Collection Time: 09/11/15  7:44 PM  Result Value Ref Range Status   Specimen Description BLOOD RIGHT HAND  Final   Special Requests BOTTLES DRAWN  AEROBIC ONLY [place]  Final   Culture NO GROWTH 5 DAYS  Final   Report Status 09/16/2015 FINAL  Final  MRSA PCR Screening     Status: None   Collection Time: 09/11/15  8:23 PM  Result Value Ref Range Status   MRSA by PCR NEGATIVE NEGATIVE Final    Comment:        The GeneXpert MRSA Assay (FDA approved for NASAL specimens only), is one component of a comprehensive MRSA colonization surveillance program. It is not intended to diagnose MRSA infection nor to guide or monitor treatment for MRSA infections.   Urine culture     Status: None   Collection Time: 09/11/15  9:48 PM  Result Value Ref Range Status   Specimen Description URINE, CLEAN CATCH  Final   Special Requests NONE  Final   Culture MULTIPLE SPECIES PRESENT, SUGGEST RECOLLECTION  Final   Report Status 09/13/2015 FINAL  Final     Studies: No results found.  Scheduled Meds: . antiseptic oral rinse  7 mL Mouth Rinse BID  . arformoterol  15 mcg Nebulization BID  . atorvastatin  10 mg Oral QHS  . budesonide  0.25 mg Nebulization BID  . cloNIDine  0.1 mg Oral BID  . diltiazem  120 mg Oral BID  . furosemide  40 mg Oral BID  . ipratropium  0.5 mg Nebulization QID  . levalbuterol  0.63 mg Nebulization QID  . levothyroxine  100 mcg Oral QAC breakfast  . predniSONE  10 mg Oral Q breakfast  . sodium chloride  3 mL Intravenous Q12H   Continuous Infusions:    Principal Problem:   Acute and chronic respiratory failure with hypoxia (HCC) Active Problems:   Hypertension   Hypothyroidism   Acute on chronic diastolic CHF (congestive heart failure) (HCC)   HX: anticoagulation   Morbid obesity (HCC)   COPD exacerbation (HCC)   Sepsis ([place])   Chronic a-fib (HCC)   UTI (urinary tract infection)   Hematoma of right lower extremity   CAP (community acquired pneumonia)   Pressure ulcer   Marzetta Board M.D. [facility] Pager [phone]. If 7PM-7AM, please contact night-coverage at [email or web address], password  [facility] 09/20/2015, 1:54 PM  LOS: 9 days

## 2015-09-20 NOTE — Clinical Social Work Note (Signed)
Patient transferred to 3E17 from 2H15, unit CSW has been given handoff.  Jones Broom. Avarae Zwart, MSW, [place] 09/20/2015 8:49 AM

## 2015-09-21 LAB — CBC
HCT: 30 % — ABNORMAL LOW (ref 36.0–46.0)
Hemoglobin: 8.6 g/dL — ABNORMAL LOW (ref 12.0–15.0)
MCH: 25 pg — AB (ref 26.0–34.0)
MCHC: 28.7 g/dL — ABNORMAL LOW (ref 30.0–36.0)
MCV: 87.2 fL (ref 78.0–100.0)
PLATELETS: 235 10*3/uL (ref 150–400)
RBC: 3.44 MIL/uL — AB (ref 3.87–5.11)
RDW: 19.8 % — ABNORMAL HIGH (ref 11.5–15.5)
WBC: 16.5 10*3/uL — AB (ref 4.0–10.5)

## 2015-09-21 LAB — BASIC METABOLIC PANEL
Anion gap: 7 (ref 5–15)
BUN: 32 mg/dL — AB (ref 6–20)
CHLORIDE: 94 mmol/L — AB (ref 101–111)
CO2: 35 mmol/L — AB (ref 22–32)
Calcium: 8.3 mg/dL — ABNORMAL LOW (ref 8.9–10.3)
Creatinine, Ser: 1.33 mg/dL — ABNORMAL HIGH (ref 0.44–1.00)
GFR calc Af Amer: 46 mL/min — ABNORMAL LOW (ref >60)
GFR calc non Af Amer: 40 mL/min — ABNORMAL LOW (ref >60)
GLUCOSE: 92 mg/dL (ref 65–99)
POTASSIUM: 4.4 mmol/L (ref 3.5–5.1)
SODIUM: 136 mmol/L (ref 135–145)

## 2015-09-21 NOTE — Progress Notes (Signed)
Patient ID: Caitlin Hampton, female   DOB: 05/02/1946, 69 y.o.   MRN: [id number] Her right leg VAC continues to have a good seal.  My plan is to take her to the OR this coming Saturday morning 12/31 for a repeat I&D of the right leg and placement of a split-thickness skin graft.  She understands this fully.

## 2015-09-21 NOTE — Progress Notes (Signed)
PROGRESS NOTE  Caitlin Hampton RUE:[id number] DOB: 07-28-1946 DOA: 09/11/2015 PCP: [name], MD  HPI / Brief interval history Patient is an 69 year old female past oral history of COPD on chronic 3 L oxygen, atrial fibrillation on chronic anticoagulation and CAD who had been having for the past 1 week increased dyspnea on exertion and nonproductive cough, but came to the emergency room on 12/19 after she was headed to see her PCP, but fell hitting her left leg which led to a large hematoma. In the emergency room, patient was found to have mild cytosis, lactic acidosis and acute respiratory failure requiring 5-6 L of oxygen. Was suspected the patient had a pneumonia causing sepsis. Chest x-ray was not very revealing, likely from dehydration. Patient started on IV antibiotics and treated with sepsis protocol with aggressive fluid resuscitation. UA came back concerning urinary tract infection. Urine cultures negative.   Patient of the last few days has slowly continued to improve.  09/13/2015 patient, needing 4-5 liters of oxygen nasal cannula she herself feels good. No complaints other than some mild right leg pain.   On 09/14/2015 patient noted to be in acute on chronic respiratory distress on a Venturi mask with sats in the high 80s to low 90s. Repeat chest x-ray done consistent with volume overload. Patient also noted to have a significantly enlarged hematoma on the right lower extremity. Patient is being diuresed and orthopedics, Dr Ninfa Linden was consulted for evacuation of right lower extremity hematoma. Patient went to OR 09/18/2015 for I and D of her right lower extremity hematoma.  Subjective: - Denies any shortness of breath, feels well  Assessment/Plan: Acute on chronic respiratory failure - Likely multifactorial largely in part secondary to acute on chronic diastolic heart failure in the setting of probable COPD exacerbation and probable community-acquired pneumonia.  - Patient  with clinical improvement.  - Cardiac enzymes negative 3. 2-D echo with EF of 55-60% with no wall motion abnormalities and severely elevated pulmonary pressures.  - Patient status post full course of antibiotics. Continue oral Lasix, steroid taper, nebulizers.  Sepsis - On admission it was felt patient met criteria for sepsis given lactic acidosis, tachypnea, tachycardia, leukocytosis and felt to likely be secondary to a pulmonary source and possible urinary tract infection. Urine culture has remained negative - Patient is currently afebrile. Patient's pulmonary status complicated by volume overload.  - Continue oral steroid taper, nebulizer treatments, s/p course of antibiotics. - Leukocytosis is likely due to steroids  Probable community-acquired pneumonia - Per admission. Patient currently afebrile. Patient also with a leukocytosis of also on steroids. Patient status post full course of antibiotic treatment.  Acute on chronic diastolic heart failure - Patient currently volume overload, improving.  - Cardiac enzymes negative 3. TSH within normal limits. 2-D echo with EF of 55-60% with no wall motion abnormalities. Severely elevated pulmonary pressure. Continue oral Lasix, Lipitor, Cardizem. - Overall she appears to have a positive balance, however urine output is not recorded, she seems to have lost about 9 pounds since admission - Continue Lasix 40 mg twice daily  Hematoma of the right lower extremity - Patient with significant swelling on right lower extremity hematoma. Patient also on chronic anticoagulation. Orthopedics has been consulted and patient was seen in consultation by Dr. Ninfa Linden who was able to use a #10 blade at the bedside to remove necrotic skin and evacuated a very large hematoma and wound packed with wet-to-dry dressing. Patient status post I and D right leg wound measuring 8  x 13 cm with placement of medium VAC sponge in the operating room on 09/18/2015. Per  orthopedics. - Would favor holding her anticoagulation for now, may be started in a week or 2 after discharge if her wound looks improved - per orthopedic surgery patient to have redo I&D and skin grafting in 2 days.   Acute blood loss anemia Likely secondary to hematoma. Patient's hemoglobin 6.3. Transfused 2 units packed red blood cells. Hold anticoagulation. - hemoglobin stable today at 8.6  Hypothyroidism - TSH is 1.984. Continue home dose Synthroid.  Chronic atrial fibrillation - Continue Cardizem for rate control. Xarelto on hold secondary to large hematoma and anemia. Could probably resume Xarelto  As an outpatient  Hyperkalemia Discontinue potassium supplementation. Kayexalate.  Code Status: Full Family Communication: Updated patient. Sister bedside Disposition Plan: Hopefully to skilled nursing facility when oxygenation is at baseline and when okay with orthopedics.   Consultants:  Orthopedics: Dr. Ninfa Linden 09/14/2015  Procedures:  Chest x-ray 09/11/2015, 09/14/2015  X-ray of the tib-fib 09/11/2015  Bedside I and D and evacuation of right lower extremity hematoma 09/14/2015  2-D echo 09/14/2015  2 units packed red blood cells 09/19/2015  Antibiotics:  IV Rocephin 09/11/2015>>>> 09/15/2015  IV Azithromycin 09/11/2015>>>09/13/2015   Objective: Filed Vitals:   09/21/15 0951 09/21/15 1127  BP: 117/55 94/55  Pulse: 71 77  Temp: 98.2 F (36.8 C) 99.1 F (37.3 C)  Resp: 20 20    Intake/Output Summary (Last 24 hours) at 09/21/15 1445 Last data filed at 09/21/15 1344  Gross per 24 hour  Intake    780 ml  Output    200 ml  Net    580 ml   Filed Weights   09/20/15 0415 09/20/15 0834 09/21/15 1127  Weight: 106.9 kg (235 lb 10.8 oz) 107 kg (235 lb 14.3 oz) 104.9 kg (231 lb 4.2 oz)    Exam:  General:  Sitting in chair in no acute cardiopulmonary distress.   Cardiovascular: Irregularly irregular  Respiratory: Decvreased BS in bases. Scattered  crackles. minimal wheezing. No rhonchi  Abdomen: Soft/NT/ND/+BS  Musculoskeletal: nO C/C/. 1 + Ble edema. RLE in tibial region with bandage and wound VAC on.  Neuro: non focal    Data Reviewed: Basic Metabolic Panel:  Recent Labs Lab 09/17/15 0225 09/18/15 0356 09/19/15 0824 09/19/15 1824 09/20/15 0354 09/21/15 0500  NA 141 141 139  --  137 136  K 3.9 4.8 5.8* 5.9* 5.1 4.4  CL 96* 96* 94*  --  93* 94*  CO2 36* 34* 36*  --  34* 35*  GLUCOSE 92 103* 125*  --  119* 92  BUN 26* 27* 32*  --  39* 32*  CREATININE 1.12* 1.09* 1.35*  --  1.47* 1.33*  CALCIUM 8.1* 8.4* 8.6*  --  8.2* 8.3*   Liver Function Tests:  Recent Labs Lab 09/16/15 0452 09/19/15 0824  AST 24 26  ALT 24 28  ALKPHOS 103 76  BILITOT 0.9 0.8  PROT 5.5* 5.3*  ALBUMIN 2.7* 2.7*   CBC:  Recent Labs Lab 09/17/15 0225 09/18/15 0356 09/19/15 0824 09/19/15 2010 09/20/15 0354 09/21/15 0500  WBC 17.5* 15.8* 14.5*  --  15.9* 16.5*  HGB 8.1* 7.8* 6.3* 8.8* 8.5* 8.6*  HCT 27.3* 27.7* 23.7* 29.7* 28.5* 30.0*  MCV 81.5 83.4 84.0  --  84.8 87.2  PLT 225 232 238  --  237 235   Cardiac Enzymes:  Recent Labs Lab 09/14/15 1753 09/14/15 2332  TROPONINI 0.04* 0.04*   BNP (  last 3 results)  Recent Labs  12/11/14 1958 09/11/15 1527 09/14/15 1320  BNP 1060.6* 737.3* 658.0*   CBG:  Recent Labs Lab 09/19/15 1143 09/19/15 1558 09/19/15 2119 09/20/15 0704  GLUCAP 152* 126* 132* 99    Recent Results (from the past 240 hour(s))  Blood Culture (routine x 2)     Status: None   Collection Time: 09/11/15  3:27 PM  Result Value Ref Range Status   Specimen Description BLOOD RIGHT FOREARM  Final   Special Requests BOTTLES DRAWN AEROBIC AND ANAEROBIC 5CC  Final   Culture NO GROWTH 5 DAYS  Final   Report Status 09/16/2015 FINAL  Final  Blood Culture (routine x 2)     Status: None   Collection Time: 09/11/15  7:44 PM  Result Value Ref Range Status   Specimen Description BLOOD RIGHT HAND  Final    Special Requests BOTTLES DRAWN AEROBIC ONLY [place]  Final   Culture NO GROWTH 5 DAYS  Final   Report Status 09/16/2015 FINAL  Final  MRSA PCR Screening     Status: None   Collection Time: 09/11/15  8:23 PM  Result Value Ref Range Status   MRSA by PCR NEGATIVE NEGATIVE Final    Comment:        The GeneXpert MRSA Assay (FDA approved for NASAL specimens only), is one component of a comprehensive MRSA colonization surveillance program. It is not intended to diagnose MRSA infection nor to guide or monitor treatment for MRSA infections.   Urine culture     Status: None   Collection Time: 09/11/15  9:48 PM  Result Value Ref Range Status   Specimen Description URINE, CLEAN CATCH  Final   Special Requests NONE  Final   Culture MULTIPLE SPECIES PRESENT, SUGGEST RECOLLECTION  Final   Report Status 09/13/2015 FINAL  Final     Studies: No results found.  Scheduled Meds: . antiseptic oral rinse  7 mL Mouth Rinse BID  . arformoterol  15 mcg Nebulization BID  . atorvastatin  10 mg Oral QHS  . budesonide  0.25 mg Nebulization BID  . cloNIDine  0.1 mg Oral BID  . diltiazem  120 mg Oral BID  . furosemide  40 mg Oral BID  . ipratropium  0.5 mg Nebulization QID  . levalbuterol  0.63 mg Nebulization QID  . levothyroxine  100 mcg Oral QAC breakfast  . predniSONE  10 mg Oral Q breakfast  . sodium chloride  3 mL Intravenous Q12H   Continuous Infusions:    Principal Problem:   Acute and chronic respiratory failure with hypoxia (HCC) Active Problems:   Hypertension   Hypothyroidism   Acute on chronic diastolic CHF (congestive heart failure) (HCC)   HX: anticoagulation   Morbid obesity (HCC)   COPD exacerbation (HCC)   Sepsis ([place])   Chronic a-fib (HCC)   UTI (urinary tract infection)   Hematoma of right lower extremity   CAP (community acquired pneumonia)   Pressure ulcer   Marzetta Board M.D. [facility] Pager [phone]. If 7PM-7AM, please contact night-coverage at  [email or web address], password [facility] 09/21/2015, 2:45 PM  LOS: 10 days

## 2015-09-21 NOTE — Progress Notes (Signed)
Physical Therapy Treatment Patient Details Name: Caitlin Hampton MRN: [id number] DOB: 04/12/1946 Today's Date: 09/21/2015    History of Present Illness Patient is an 69 year old female past history of COPD on chronic 3 L oxygen, atrial fibrillation on chronic anticoagulation and CAD who presented to ED with increased dyspnea on exertion and nonproductive cough, and s/p fall hitting her left leg which led to a large hematoma. Patient found to have acute respiratory failure requiring 5-6 L of oxygen.  Dr. Ninfa Linden took to surgery on 09/18/15 for I&D of Right leg wound and VAC placement.  Scheduled for another surgery 09/23/15 for a repeat I&D of the right leg and placement of a split-thickness skin graft    PT Comments    Pt was able to get OOB to the recliner chair today with less assistance than last session.  She is very self limiting and anxious due to fear of falling and needs significant convincing to participate.  I think she is ready for two person assist gait next session and would benefit from chair to follow to limit her anxiety.    Follow Up Recommendations  SNF     Equipment Recommendations  None recommended by PT    Recommendations for Other Services   NA     Precautions / Restrictions Precautions Precautions: Fall Precaution Comments: wound vac, L LE    Mobility  Bed Mobility Overal bed mobility: Needs Assistance Bed Mobility: Rolling;Sidelying to Sit Rolling: Min assist Sidelying to sit: Min assist       General bed mobility comments: Min assist to help pt find railing and to move hips and last bit of legs to EOB.  Assist provided (hand held) to pull up from sidelying to sitting.  Pt needs encouragement the whole time as she doesn't believe she can do it.    Transfers Overall transfer level: Needs assistance Equipment used: Rolling walker (2 wheeled) Transfers: Sit to/from [facility] Sit to Stand: +2 physical assistance;Min assist Stand pivot  transfers: +2 physical assistance;Min assist       General transfer comment: Two person min assist with RW to stand and take a few pivotal steps to the recliner chair.  Pt very fearful of falling during transfer, however, is requiring less assistance and, with help is fairly stable.   Ambulation/Gait             General Gait Details: too anxious to try today, but needs to be encouraged next session to walk, especially if she is still doing this well.           Balance Overall balance assessment: Needs assistance Sitting-balance support: Feet supported;No upper extremity supported Sitting balance-Leahy Scale: Good     Standing balance support: Bilateral upper extremity supported Standing balance-Leahy Scale: Poor                      Cognition Arousal/Alertness: Awake/alert Behavior During Therapy: Anxious Overall Cognitive Status: Impaired/Different from baseline Area of Impairment: Safety/judgement         Safety/Judgement: Decreased awareness of safety          Exercises General Exercises - Lower Extremity Ankle Circles/Pumps: AROM;Both;10 reps Quad Sets: AROM;Both;10 reps Long Arc Quad: AROM;Both;10 reps Hip ABduction/ADduction: AROM;Both;10 reps Straight Leg Raises: AAROM;Both;10 reps Hip Flexion/Marching: AROM;Both;10 reps        Pertinent Vitals/Pain Pain Assessment: No/denies pain (at rest, 4/10 with movement) Faces Pain Scale: Hurts little more Pain Location: right lower leg with movement Pain  Descriptors / Indicators: Grimacing;Guarding Pain Intervention(s): Limited activity within patient's tolerance;Monitored during session;Repositioned           PT Goals (current goals can now be found in the care plan section) Acute Rehab PT Goals Patient Stated Goal: to not fall Progress towards PT goals: Progressing toward goals    Frequency  Min 3X/week    PT Plan Current plan remains appropriate       End of Session Equipment  Utilized During Treatment: Gait belt;Oxygen Activity Tolerance: No increased pain;Other (comment) (self limiting/anxiety) Patient left: in chair;with call bell/phone within reach;with chair alarm set     Time: [id number] PT Time Calculation (min) (ACUTE ONLY): 36 min  Charges:  $Therapeutic Exercise: 8-22 mins $Therapeutic Activity: 8-22 mins                      Cai Anfinson B. Salwa Bai, PT, DPT [phone]   09/21/2015, 4:13 PM

## 2015-09-22 ENCOUNTER — Inpatient Hospital Stay (HOSPITAL_COMMUNITY): Payer: Medicare Other

## 2015-09-22 DIAGNOSIS — I10 Essential (primary) hypertension: Secondary | ICD-10-CM

## 2015-09-22 LAB — CBC
HEMATOCRIT: 29.2 % — AB (ref 36.0–46.0)
HEMOGLOBIN: 8.5 g/dL — AB (ref 12.0–15.0)
MCH: 25.4 pg — AB (ref 26.0–34.0)
MCHC: 29.1 g/dL — AB (ref 30.0–36.0)
MCV: 87.4 fL (ref 78.0–100.0)
Platelets: 220 10*3/uL (ref 150–400)
RBC: 3.34 MIL/uL — ABNORMAL LOW (ref 3.87–5.11)
RDW: 20.5 % — ABNORMAL HIGH (ref 11.5–15.5)
WBC: 13.7 10*3/uL — ABNORMAL HIGH (ref 4.0–10.5)

## 2015-09-22 LAB — BASIC METABOLIC PANEL
ANION GAP: 9 (ref 5–15)
BUN: 27 mg/dL — ABNORMAL HIGH (ref 6–20)
CO2: 35 mmol/L — ABNORMAL HIGH (ref 22–32)
Calcium: 8.1 mg/dL — ABNORMAL LOW (ref 8.9–10.3)
Chloride: 92 mmol/L — ABNORMAL LOW (ref 101–111)
Creatinine, Ser: 1.26 mg/dL — ABNORMAL HIGH (ref 0.44–1.00)
GFR calc Af Amer: 49 mL/min — ABNORMAL LOW (ref >60)
GFR, EST NON AFRICAN AMERICAN: 42 mL/min — AB (ref >60)
GLUCOSE: 93 mg/dL (ref 65–99)
POTASSIUM: 4 mmol/L (ref 3.5–5.1)
Sodium: 136 mmol/L (ref 135–145)

## 2015-09-22 MED ORDER — SORBITOL 70 % SOLN
960.0000 mL | TOPICAL_OIL | Freq: Once | ORAL | Status: DC
  Filled 2015-09-22: qty 240

## 2015-09-22 NOTE — Care Management Important Message (Signed)
Important Message  Patient Details  Name: Caitlin Hampton MRN: [id number] Date of Birth: September 11, 1946   Medicare Important Message Given:  Yes    [name] 09/22/2015, 2:03 PM

## 2015-09-22 NOTE — Progress Notes (Signed)
Occupational Therapy Treatment Patient Details Name: Caitlin Hampton MRN: [id number] DOB: 12/23/1945 Today's Date: 09/22/2015    History of present illness Patient is an 69 year old female past history of COPD on chronic 3 L oxygen, atrial fibrillation on chronic anticoagulation and CAD who presented to ED with increased dyspnea on exertion and nonproductive cough, and s/p fall hitting her left leg which led to a large hematoma. Patient found to have acute respiratory failure requiring 5-6 L of oxygen.  Dr. Ninfa Linden took to surgery on 09/18/15 for I&D of Right leg wound and VAC placement.  Scheduled for another surgery 09/23/15 for a repeat I&D of the right leg and placement of a split-thickness skin graft   OT comments  Patient progressing slowly towards OT goals. Pt continues to be limited by increased anxiety. Pt continuously states "I can't" when asked to do something and patient requires max encouragement and psychosocial support.   RN assisted with +2. Pt engaged in bed mobility and sat EOB. Pt with huge urine incontinence in bed. Pt required max encouragement to stand to have self and bed cleaned, therapist had to explain poor hygiene can cause skin breakdown. From here, pt transferred EOB to recliner with max encouragement and mod+2 assist. Once in recliner, pt stated she needed to use bathroom. Attempted sit to/from stand X3, and patient unable to fully come to standing to safely transfer to [facility]. Put bedpan under patient, but patient still unsuccessful urine output. Recommended patient call staff for assistance for toileting needs and attempting transfer to [facility] with +2 assist, discussed importance of NOT using bathroom on self. At end of session, left patient seated in recliner with all needs within reach and [facility] boot on right. Patient's sister present entire session and provided support prn.   During session, patient's sats dropped to 80% on 6L/min supplemental 02. Increased to 10 L/min  supplemental 02 and sats increased to 90%; RN aware of this.    Follow Up Recommendations  SNF;Supervision/Assistance - 24 hour    Equipment Recommendations  Other (comment) (TBD next venue of care)    Recommendations for Other Services  None at this time   Precautions / Restrictions Precautions Precautions: Fall Precaution Comments: wound vac, L LE Restrictions Weight Bearing Restrictions: No    Mobility Bed Mobility Overal bed mobility: Needs Assistance Bed Mobility: Rolling;Sidelying to Sit Rolling: Min assist Sidelying to sit: Min assist       General bed mobility comments: Min assist to help pt find railing and to move hips and last bit of legs to EOB.  Assist provided (hand held) to pull up from sidelying to sitting.  Pt needs encouragement the whole time as she doesn't believe she can do it.    Transfers Overall transfer level: Needs assistance Equipment used: Rolling walker (2 wheeled) Transfers: Sit to/from [facility] Sit to Stand: +2 physical assistance;Mod assist Stand pivot transfers: +2 physical assistance;Mod assist       General transfer comment: Pt with increased anxiety which limits her ability to perform tasks. Pt continiously stating "I can't!"     Balance Overall balance assessment: Needs assistance Sitting-balance support: No upper extremity supported;Feet supported Sitting balance-Leahy Scale: Fair Sitting balance - Comments: Pt with significiant right lateral lean in sitting and required cues to come to midline. While seated, pt holding onto walker and loosing balance forward, arms falling off from walker. Behavior/anxiety? VS weakness?  Postural control: Right lateral lean Standing balance support: Bilateral upper extremity supported Standing balance-Leahy Scale:  Poor   ADL Overall ADL's : Needs assistance/impaired Eating/Feeding: Set up;Sitting Toilet Transfer Details (indicate cue type and reason): Pt transferred EOB to  recliner, then attempted transfer to [facility] but pt unable due to fatigue and poor oxygen support  General ADL Comments: Pt with poor activity tolerance. Pt requires max encouragement and psychosocial support. Pt very anxious which limits independence.      Cognition   Behavior During Therapy: Anxious Overall Cognitive Status: Impaired/Different from baseline Area of Impairment: Orientation;Following commands;Safety/judgement;Awareness;Problem solving Orientation Level: Disoriented to;Place;Time;Situation   Memory: Decreased short-term memory  Following Commands: Follows one step commands with increased time Safety/Judgement: Decreased awareness of safety Awareness: Intellectual   General Comments: Patient often asking "where should I put my hands" or "how am I going to do that" - difficult to tell if this is cognitive in nature or just anxiety. Pt stated "this doesn't look like [place]". Pt also had difficult time stating date, giving therapist random numbers.                  Pertinent Vitals/ Pain       Pain Assessment: Faces Faces Pain Scale: Hurts little more Pain Location: right LE with movement and touch Pain Descriptors / Indicators: Grimacing;Guarding Pain Intervention(s): Limited activity within patient's tolerance;Monitored during session;Repositioned   Frequency Min 2X/week     Progress Toward Goals  OT Goals(current goals can now befound in the care plan section)  Progress towards OT goals: Progressing toward goals  Acute Rehab OT Goals Patient Stated Goal: to not fall  Plan Discharge plan remains appropriate    End of Session Equipment Utilized During Treatment: Gait belt;Rolling walker;Oxygen (10L/min supplemental 02 at end of session)   Activity Tolerance Patient limited by fatigue   Patient Left in chair;with call bell/phone within reach;with chair alarm set;with nursing/sitter in room  Nurse Communication Mobility status     Time: [id number] OT  Time Calculation (min): 40 min  Charges: OT General Charges $OT Visit: 1 Procedure OT Treatments $Self Care/Home Management : 38-52 mins  Chrys Racer , MS, OTR/L, CLT Pager: [phone]  09/22/2015, 4:05 PM

## 2015-09-22 NOTE — Anesthesia Preprocedure Evaluation (Addendum)
Anesthesia Evaluation  Patient identified by MRN, date of birth, ID band Patient awake    Reviewed: Allergy & Precautions, H&P , NPO status , Patient's Chart, lab work & pertinent test results  Airway Mallampati: III  TM Distance: >3 FB Neck ROM: Limited    Dental  (+) Missing, Upper Dentures, Lower Dentures, Poor Dentition, Dental Advisory Given   Pulmonary shortness of breath, COPD,  oxygen dependent, former smoker,     + decreased breath sounds+ wheezing      Cardiovascular hypertension, +CHF  + dysrhythmias Atrial Fibrillation  Rhythm:Irregular Rate:Normal  Ef 55-60, elevated pulmonary pressures per TTE on 12/22, mildly dilated RV, mildly reduced RV function   Neuro/Psych PSYCHIATRIC DISORDERS Anxiety    GI/Hepatic hiatal hernia, GERD  ,  Endo/Other  Hypothyroidism Morbid obesity  Renal/GU Renal disease     Musculoskeletal   Abdominal   Peds  Hematology  (+) anemia ,   Anesthesia Other Findings 02 requirement back to baseline with diuresis, Hgb 8 and stable -on 4L [place] oxygen  Reproductive/Obstetrics                            Anesthesia Physical  Anesthesia Plan  ASA: III  Anesthesia Plan: General   Post-op Pain Management:    Induction: Intravenous  Airway Management Planned: LMA  Additional Equipment:   Intra-op Plan:   Post-operative Plan: Extubation in OR  Informed Consent: I have reviewed the patients History and Physical, chart, labs and discussed the procedure including the risks, benefits and alternatives for the proposed anesthesia with the patient or authorized representative who has indicated his/her understanding and acceptance.   Dental advisory given  Plan Discussed with: CRNA and Anesthesiologist  Anesthesia Plan Comments:         Anesthesia Quick Evaluation

## 2015-09-22 NOTE — Progress Notes (Signed)
PROGRESS NOTE  Caitlin Hampton OMV:[id number] DOB: 1945/10/30 DOA: 09/11/2015 PCP: [name], MD  HPI / Brief interval history Patient is an 69 year old female past oral history of COPD on chronic 3 L oxygen, atrial fibrillation on chronic anticoagulation and CAD who had been having for the past 1 week increased dyspnea on exertion and nonproductive cough, but came to the emergency room on 12/19 after she was headed to see her PCP, but fell hitting her left leg which led to a large hematoma. In the emergency room, patient was found to have mild cytosis, lactic acidosis and acute respiratory failure requiring 5-6 L of oxygen. Was suspected the patient had a pneumonia causing sepsis. Chest x-ray was not very revealing, likely from dehydration. Patient started on IV antibiotics and treated with sepsis protocol with aggressive fluid resuscitation. UA came back concerning urinary tract infection. Urine cultures negative.   Patient of the last few days has slowly continued to improve.  09/13/2015 patient, needing 4-5 liters of oxygen nasal cannula she herself feels good. No complaints other than some mild right leg pain.   On 09/14/2015 patient noted to be in acute on chronic respiratory distress on a Venturi mask with sats in the high 80s to low 90s. Repeat chest x-ray done consistent with volume overload. Patient also noted to have a significantly enlarged hematoma on the right lower extremity. Patient is being diuresed and orthopedics, Dr Ninfa Linden was consulted for evacuation of right lower extremity hematoma. Patient went to OR 09/18/2015 for I and D of her right lower extremity hematoma and plans to go to operating room again on 12/21  Subjective: - Endorses being very constipated, she appears uncomfortably in bed and straining - Endorses mild shortness of breath "due to constipation"  Assessment/Plan: Acute on chronic respiratory failure - Likely multifactorial largely in part secondary to  acute on chronic diastolic heart failure in the setting of probable COPD exacerbation and probable community-acquired pneumonia.  - Patient with clinical improvement.  - Cardiac enzymes negative 3. 2-D echo with EF of 55-60% with no wall motion abnormalities and severely elevated pulmonary pressures.  - Patient status post full course of antibiotics. steroid taper, nebulizers. - Her breathing appears to be a little bit more labored this morning, repeat a chest x-ray. She appears to be positive still 7 L, however she has unmeasured urine output and her weight has improved since admission, she was 244 pounds and currently at 229. Continue Lasix as well as daily weights.  Sepsis - On admission it was felt patient met criteria for sepsis given lactic acidosis, tachypnea, tachycardia, leukocytosis and felt to likely be secondary to a pulmonary source and possible urinary tract infection. Urine culture has remained negative - Patient is currently afebrile. Patient's pulmonary status complicated by volume overload.  - Continue oral steroid taper, nebulizer treatments, s/p course of antibiotics. - Leukocytosis is likely due to steroids, improving, she is afebrile  Probable community-acquired pneumonia - Per admission. Patient currently afebrile. Patient also with a leukocytosis of also on steroids. Patient status post full course of antibiotic treatment.  Acute on chronic diastolic heart failure - Patient currently volume overload, improving.  - Cardiac enzymes negative 3. TSH within normal limits. 2-D echo with EF of 55-60% with no wall motion abnormalities. Severely elevated pulmonary pressure. Continue oral Lasix, Lipitor, Cardizem. - I's and O's are on accurate, however her weight is trending down, 244 >> 229 - Continue Lasix 40 mg twice daily  Hematoma of the right  lower extremity - Patient with significant swelling on right lower extremity hematoma. Patient also on chronic anticoagulation.  Orthopedics has been consulted and patient was seen in consultation by Dr. Ninfa Linden who was able to use a #10 blade at the bedside to remove necrotic skin and evacuated a very large hematoma and wound packed with wet-to-dry dressing. Patient status post I and D right leg wound measuring 8 x 13 cm with placement of medium VAC sponge in the operating room on 09/18/2015. Per orthopedics. - Would favor holding her anticoagulation for now, may be started in a week or 2 after discharge if her wound looks improved - per orthopedic surgery patient to have redo I&D and skin grafting on 12/31 by Dr. Ninfa Linden   Acute blood loss anemia Likely secondary to hematoma. Patient's hemoglobin 6.3. Transfused 2 units packed red blood cells. Hold anticoagulation. - hemoglobin stable today at 8.5, no need for transfusions   Hypothyroidism - TSH is 1.984. Continue home dose Synthroid.  Chronic atrial fibrillation - Continue Cardizem for rate control. Xarelto on hold secondary to large hematoma and anemia. Could probably resume Xarelto  As an outpatient  Hyperkalemia Discontinue potassium supplementation. Kayexalate.  Code Status: Full Family Communication: Updated patient. Sister bedside Disposition Plan: Hopefully to skilled nursing facility when oxygenation is at baseline and when okay with orthopedics.   Consultants:  Orthopedics: Dr. Ninfa Linden 09/14/2015  Procedures:  Chest x-ray 09/11/2015, 09/14/2015  X-ray of the tib-fib 09/11/2015  Bedside I and D and evacuation of right lower extremity hematoma 09/14/2015  2-D echo 09/14/2015  2 units packed red blood cells 09/19/2015  Antibiotics:  IV Rocephin 09/11/2015>>>> 09/15/2015  IV Azithromycin 09/11/2015>>>09/13/2015   Objective: Filed Vitals:   09/22/15 0500 09/22/15 1154  BP: 91/46 120/88  Pulse: 69 38  Temp: 98.4 F (36.9 C) 98.5 F (36.9 C)  Resp: 18 18    Intake/Output Summary (Last 24 hours) at 09/22/15 1304 Last data filed at  09/22/15 0918  Gross per 24 hour  Intake    960 ml  Output    200 ml  Net    760 ml   Filed Weights   09/20/15 0834 09/21/15 1127 09/22/15 0500  Weight: 107 kg (235 lb 14.3 oz) 104.9 kg (231 lb 4.2 oz) 104 kg (229 lb 4.5 oz)    Exam:  General:  Sitting in chair in no acute cardiopulmonary distress.   Cardiovascular: Irregularly irregular  Respiratory: Decvreased BS in bases. Scattered crackles. minimal wheezing. No rhonchi  Abdomen: Soft/NT/ND/+BS  Musculoskeletal: nO C/C/. 1 + Ble edema. RLE in tibial region with bandage and wound VAC on.  Neuro: non focal    Data Reviewed: Basic Metabolic Panel:  Recent Labs Lab 09/18/15 0356 09/19/15 0824 09/19/15 1824 09/20/15 0354 09/21/15 0500 09/22/15 0700  NA 141 139  --  137 136 136  K 4.8 5.8* 5.9* 5.1 4.4 4.0  CL 96* 94*  --  93* 94* 92*  CO2 34* 36*  --  34* 35* 35*  GLUCOSE 103* 125*  --  119* 92 93  BUN 27* 32*  --  39* 32* 27*  CREATININE 1.09* 1.35*  --  1.47* 1.33* 1.26*  CALCIUM 8.4* 8.6*  --  8.2* 8.3* 8.1*   Liver Function Tests:  Recent Labs Lab 09/16/15 0452 09/19/15 0824  AST 24 26  ALT 24 28  ALKPHOS 103 76  BILITOT 0.9 0.8  PROT 5.5* 5.3*  ALBUMIN 2.7* 2.7*   CBC:  Recent Labs Lab 09/18/15 0356  09/19/15 0824 09/19/15 2010 09/20/15 0354 09/21/15 0500 09/22/15 0700  WBC 15.8* 14.5*  --  15.9* 16.5* 13.7*  HGB 7.8* 6.3* 8.8* 8.5* 8.6* 8.5*  HCT 27.7* 23.7* 29.7* 28.5* 30.0* 29.2*  MCV 83.4 84.0  --  84.8 87.2 87.4  PLT 232 238  --  237 235 220   Cardiac Enzymes: No results for input(s): CKTOTAL, CKMB, CKMBINDEX, TROPONINI in the last 168 hours. BNP (last 3 results)  Recent Labs  12/11/14 1958 09/11/15 1527 09/14/15 1320  BNP 1060.6* 737.3* 658.0*   CBG:  Recent Labs Lab 09/19/15 1143 09/19/15 1558 09/19/15 2119 09/20/15 0704  GLUCAP 152* 126* 132* 99    No results found for this or any previous visit (from the past 240 hour(s)).   Studies: No results  found.  Scheduled Meds: . antiseptic oral rinse  7 mL Mouth Rinse BID  . arformoterol  15 mcg Nebulization BID  . atorvastatin  10 mg Oral QHS  . budesonide  0.25 mg Nebulization BID  . cloNIDine  0.1 mg Oral BID  . diltiazem  120 mg Oral BID  . furosemide  40 mg Oral BID  . ipratropium  0.5 mg Nebulization QID  . levalbuterol  0.63 mg Nebulization QID  . levothyroxine  100 mcg Oral QAC breakfast  . predniSONE  10 mg Oral Q breakfast  . sodium chloride  3 mL Intravenous Q12H  . sorbitol, milk of mag, mineral oil, glycerin (SMOG) enema  960 mL Rectal Once   Continuous Infusions:    Principal Problem:   Acute and chronic respiratory failure with hypoxia (HCC) Active Problems:   Hypertension   Hypothyroidism   Acute on chronic diastolic CHF (congestive heart failure) (HCC)   HX: anticoagulation   Morbid obesity (HCC)   COPD exacerbation (HCC)   Sepsis ([place])   Chronic a-fib (HCC)   UTI (urinary tract infection)   Hematoma of right lower extremity   CAP (community acquired pneumonia)   Pressure ulcer   Marzetta Board M.D. [facility] Pager [phone]. If 7PM-7AM, please contact night-coverage at [email or web address], password [facility] 09/22/2015, 1:04 PM  LOS: 11 days

## 2015-09-23 ENCOUNTER — Encounter (HOSPITAL_COMMUNITY)
Admission: EM | Disposition: A | Discharge status: Skilled Nursing Facility | Payer: Self-pay | Source: Home / Self Care | Attending: Internal Medicine

## 2015-09-23 ENCOUNTER — Inpatient Hospital Stay (HOSPITAL_COMMUNITY): Payer: Medicare Other | Admitting: Anesthesiology

## 2015-09-23 HISTORY — PX: SKIN SPLIT GRAFT: SHX444

## 2015-09-23 LAB — BASIC METABOLIC PANEL
ANION GAP: 10 (ref 5–15)
BUN: 24 mg/dL — ABNORMAL HIGH (ref 6–20)
CO2: 33 mmol/L — AB (ref 22–32)
Calcium: 8.2 mg/dL — ABNORMAL LOW (ref 8.9–10.3)
Chloride: 93 mmol/L — ABNORMAL LOW (ref 101–111)
Creatinine, Ser: 1.23 mg/dL — ABNORMAL HIGH (ref 0.44–1.00)
GFR, EST AFRICAN AMERICAN: 51 mL/min — AB (ref >60)
GFR, EST NON AFRICAN AMERICAN: 44 mL/min — AB (ref >60)
GLUCOSE: 85 mg/dL (ref 65–99)
Potassium: 4.2 mmol/L (ref 3.5–5.1)
Sodium: 136 mmol/L (ref 135–145)

## 2015-09-23 LAB — GLUCOSE, CAPILLARY: Glucose-Capillary: 105 mg/dL — ABNORMAL HIGH (ref 65–99)

## 2015-09-23 SURGERY — APPLICATION, GRAFT, SKIN, SPLIT-THICKNESS
Anesthesia: General | Site: Leg Lower | Laterality: Right

## 2015-09-23 MED ORDER — MINERAL OIL LIGHT 100 % EX OIL
TOPICAL_OIL | CUTANEOUS | Status: AC
  Filled 2015-09-23: qty 25

## 2015-09-23 MED ORDER — LACTATED RINGERS IV SOLN
INTRAVENOUS | Status: DC | PRN
  Administered 2015-09-23: 07:00:00 via INTRAVENOUS

## 2015-09-23 MED ORDER — PROPOFOL 10 MG/ML IV BOLUS
INTRAVENOUS | Status: DC | PRN
  Administered 2015-09-23: 100 mg via INTRAVENOUS

## 2015-09-23 MED ORDER — PHENYLEPHRINE HCL 10 MG/ML IJ SOLN
INTRAMUSCULAR | Status: DC | PRN
  Administered 2015-09-23: 120 ug via INTRAVENOUS
  Administered 2015-09-23: 80 ug via INTRAVENOUS

## 2015-09-23 MED ORDER — FENTANYL CITRATE (PF) 250 MCG/5ML IJ SOLN
INTRAMUSCULAR | Status: DC | PRN
  Administered 2015-09-23 (×2): 50 ug via INTRAVENOUS
  Administered 2015-09-23 (×2): 25 ug via INTRAVENOUS
  Administered 2015-09-23: 50 ug via INTRAVENOUS

## 2015-09-23 MED ORDER — ALBUTEROL SULFATE (2.5 MG/3ML) 0.083% IN NEBU: 2.5000 mg | INHALATION_SOLUTION | Freq: Once | RESPIRATORY_TRACT | Status: DC

## 2015-09-23 MED ORDER — EPINEPHRINE HCL 1 MG/ML IJ SOLN
INTRAMUSCULAR | Status: DC | PRN
  Administered 2015-09-23: 1 mg

## 2015-09-23 MED ORDER — ONDANSETRON HCL 4 MG/2ML IJ SOLN: 4.0000 mg | Freq: Once | INTRAMUSCULAR | Status: DC | PRN

## 2015-09-23 MED ORDER — FENTANYL CITRATE (PF) 100 MCG/2ML IJ SOLN: 25.0000 ug | INTRAMUSCULAR | Status: DC | PRN

## 2015-09-23 MED ORDER — IPRATROPIUM BROMIDE 0.02 % IN SOLN: 0.5000 mg | Freq: Once | RESPIRATORY_TRACT | Status: DC

## 2015-09-23 MED ORDER — CEFAZOLIN SODIUM-DEXTROSE 2-3 GM-% IV SOLR
INTRAVENOUS | Status: AC
  Administered 2015-09-23: 2 g via INTRAVENOUS
  Filled 2015-09-23: qty 50

## 2015-09-23 MED ORDER — EPINEPHRINE HCL 1 MG/ML IJ SOLN
INTRAMUSCULAR | Status: AC
  Filled 2015-09-23: qty 1

## 2015-09-23 MED ORDER — SODIUM CHLORIDE 0.9 % IR SOLN
Status: DC | PRN
  Administered 2015-09-23: 1000 mL

## 2015-09-23 MED ORDER — DEXAMETHASONE SODIUM PHOSPHATE 4 MG/ML IJ SOLN
INTRAMUSCULAR | Status: AC
  Filled 2015-09-23: qty 1

## 2015-09-23 MED ORDER — EPINEPHRINE HCL 0.1 MG/ML IJ SOSY
PREFILLED_SYRINGE | INTRAMUSCULAR | Status: AC
  Filled 2015-09-23: qty 10

## 2015-09-23 MED ORDER — ONDANSETRON HCL 4 MG/2ML IJ SOLN
INTRAMUSCULAR | Status: AC
  Filled 2015-09-23: qty 2

## 2015-09-23 MED ORDER — LIDOCAINE HCL (CARDIAC) 20 MG/ML IV SOLN
INTRAVENOUS | Status: DC | PRN
  Administered 2015-09-23: 3 mL via INTRATRACHEAL

## 2015-09-23 MED ORDER — ONDANSETRON HCL 4 MG/2ML IJ SOLN
INTRAMUSCULAR | Status: DC | PRN
  Administered 2015-09-23: 4 mg via INTRAVENOUS

## 2015-09-23 MED ORDER — DEXAMETHASONE SODIUM PHOSPHATE 4 MG/ML IJ SOLN
INTRAMUSCULAR | Status: DC | PRN
Start: 2015-09-23 — End: 2015-09-23
  Administered 2015-09-23: 4 mg via INTRAVENOUS

## 2015-09-23 MED ORDER — LIDOCAINE HCL (CARDIAC) 20 MG/ML IV SOLN
INTRAVENOUS | Status: AC
  Filled 2015-09-23: qty 5

## 2015-09-23 MED ORDER — PHENYLEPHRINE 40 MCG/ML (10ML) SYRINGE FOR IV PUSH (FOR BLOOD PRESSURE SUPPORT)
PREFILLED_SYRINGE | INTRAVENOUS | Status: AC
  Filled 2015-09-23: qty 10

## 2015-09-23 MED ORDER — FENTANYL CITRATE (PF) 250 MCG/5ML IJ SOLN
INTRAMUSCULAR | Status: AC
  Filled 2015-09-23: qty 5

## 2015-09-23 MED ORDER — PROPOFOL 10 MG/ML IV BOLUS
INTRAVENOUS | Status: AC
  Filled 2015-09-23: qty 20

## 2015-09-23 MED ORDER — LACTATED RINGERS IV SOLN
INTRAVENOUS | Status: DC
  Administered 2015-09-25: 17:00:00 via INTRAVENOUS

## 2015-09-23 MED ORDER — MIDAZOLAM HCL 2 MG/2ML IJ SOLN
INTRAMUSCULAR | Status: AC
  Filled 2015-09-23: qty 2

## 2015-09-23 MED ORDER — MINERAL OIL LIGHT 100 % EX OIL
TOPICAL_OIL | CUTANEOUS | Status: DC | PRN
  Administered 2015-09-23: 1 via TOPICAL

## 2015-09-23 SURGICAL SUPPLY — 52 items
BANDAGE ACE 6X5 VEL STRL LF (GAUZE/BANDAGES/DRESSINGS) ×4 IMPLANT
BANDAGE ELASTIC 6 VELCRO ST LF (GAUZE/BANDAGES/DRESSINGS) ×4 IMPLANT
BLADE DERMATOME II (BLADE) ×2 IMPLANT
BLADE SURG ROTATE 9660 (MISCELLANEOUS) IMPLANT
BNDG GAUZE ELAST 4 BULKY (GAUZE/BANDAGES/DRESSINGS) ×4 IMPLANT
CANISTER SUCTION 2500CC (MISCELLANEOUS) ×2 IMPLANT
CANISTER WOUND CARE 500ML ATS (WOUND CARE) ×2 IMPLANT
COTTONBALL LRG STERILE PKG (GAUZE/BANDAGES/DRESSINGS) ×4 IMPLANT
COVER SURGICAL LIGHT HANDLE (MISCELLANEOUS) ×2 IMPLANT
DEPRESSOR TONGUE BLADE STERILE (MISCELLANEOUS) ×4 IMPLANT
DERMACARRIERS GRAFT 1 TO 1.5 (DISPOSABLE) ×2
DRAPE INCISE IOBAN 66X45 STRL (DRAPES) ×2 IMPLANT
DRAPE ORTHO SPLIT 77X108 STRL (DRAPES) ×2
DRAPE SURG ORHT 6 SPLT 77X108 (DRAPES) ×2 IMPLANT
DRSG ADAPTIC 3X8 NADH LF (GAUZE/BANDAGES/DRESSINGS) ×2 IMPLANT
DRSG PAD ABDOMINAL 8X10 ST (GAUZE/BANDAGES/DRESSINGS) ×6 IMPLANT
DRSG VAC ATS MED SENSATRAC (GAUZE/BANDAGES/DRESSINGS) ×2 IMPLANT
ELECT REM PT RETURN 9FT ADLT (ELECTROSURGICAL) ×2
ELECTRODE REM PT RTRN 9FT ADLT (ELECTROSURGICAL) ×1 IMPLANT
GAUZE SPONGE 4X4 12PLY STRL (GAUZE/BANDAGES/DRESSINGS) IMPLANT
GAUZE XEROFORM 5X9 LF (GAUZE/BANDAGES/DRESSINGS) ×2 IMPLANT
GLOVE BIO SURGEON STRL SZ7 (GLOVE) ×4 IMPLANT
GLOVE BIO SURGEON STRL SZ8 (GLOVE) ×2 IMPLANT
GLOVE BIOGEL PI IND STRL 7.0 (GLOVE) ×1 IMPLANT
GLOVE BIOGEL PI IND STRL 8 (GLOVE) ×2 IMPLANT
GLOVE BIOGEL PI INDICATOR 7.0 (GLOVE) ×1
GLOVE BIOGEL PI INDICATOR 8 (GLOVE) ×2
GLOVE ORTHO TXT STRL SZ7.5 (GLOVE) ×4 IMPLANT
GOWN STRL REUS W/ TWL LRG LVL3 (GOWN DISPOSABLE) ×1 IMPLANT
GOWN STRL REUS W/ TWL XL LVL3 (GOWN DISPOSABLE) ×1 IMPLANT
GOWN STRL REUS W/TWL LRG LVL3 (GOWN DISPOSABLE) ×1
GOWN STRL REUS W/TWL XL LVL3 (GOWN DISPOSABLE) ×1
GRAFT DERMACARRIERS 1 TO 1.5 (DISPOSABLE) ×1 IMPLANT
HANDPIECE INTERPULSE COAX TIP (DISPOSABLE)
KIT BASIN OR (CUSTOM PROCEDURE TRAY) ×2 IMPLANT
KIT ROOM TURNOVER OR (KITS) ×2 IMPLANT
MANIFOLD NEPTUNE II (INSTRUMENTS) IMPLANT
MARKER SKIN DUAL TIP RULER LAB (MISCELLANEOUS) ×2 IMPLANT
NS IRRIG 1000ML POUR BTL (IV SOLUTION) ×2 IMPLANT
PACK ORTHO EXTREMITY (CUSTOM PROCEDURE TRAY) ×2 IMPLANT
PAD ARMBOARD 7.5X6 YLW CONV (MISCELLANEOUS) ×4 IMPLANT
PADDING CAST COTTON 6X4 STRL (CAST SUPPLIES) IMPLANT
SET HNDPC FAN SPRY TIP SCT (DISPOSABLE) IMPLANT
SPONGE LAP 18X18 X RAY DECT (DISPOSABLE) IMPLANT
STAPLER VISISTAT 35W (STAPLE) IMPLANT
SUT MNCRL AB 4-0 PS2 18 (SUTURE) ×8 IMPLANT
TOWEL OR 17X24 6PK STRL BLUE (TOWEL DISPOSABLE) ×2 IMPLANT
TOWEL OR 17X26 10 PK STRL BLUE (TOWEL DISPOSABLE) ×2 IMPLANT
TUBE CONNECTING 12X1/4 (SUCTIONS) ×2 IMPLANT
UNDERPAD 30X30 INCONTINENT (UNDERPADS AND DIAPERS) ×2 IMPLANT
WATER STERILE IRR 1000ML POUR (IV SOLUTION) ×2 IMPLANT
YANKAUER SUCT BULB TIP NO VENT (SUCTIONS) IMPLANT

## 2015-09-23 NOTE — Progress Notes (Signed)
PROGRESS NOTE  Caitlin Hampton DDU:[id number] DOB: 1946-08-29 DOA: 09/11/2015 PCP: [name], MD  HPI / Brief interval history Patient is an 69 year old female past oral history of COPD on chronic 3 L oxygen, atrial fibrillation on chronic anticoagulation and CAD who had been having for the past 1 week increased dyspnea on exertion and nonproductive cough, but came to the emergency room on 12/19 after she was headed to see her PCP, but fell hitting her left leg which led to a large hematoma. In the emergency room, patient was found to have mild cytosis, lactic acidosis and acute respiratory failure requiring 5-6 L of oxygen. Was suspected the patient had a pneumonia causing sepsis. Chest x-ray was not very revealing, likely from dehydration. Patient started on IV antibiotics and treated with sepsis protocol with aggressive fluid resuscitation. UA came back concerning urinary tract infection. Urine cultures negative.   Patient of the last few days has slowly continued to improve.  09/13/2015 patient, needing 4-5 liters of oxygen nasal cannula she herself feels good. No complaints other than some mild right leg pain.   On 09/14/2015 patient noted to be in acute on chronic respiratory distress on a Venturi mask with sats in the high 80s to low 90s. Repeat chest x-ray done consistent with volume overload. Patient also noted to have a significantly enlarged hematoma on the right lower extremity. Patient is being diuresed and orthopedics, Dr Ninfa Linden was consulted for evacuation of right lower extremity hematoma. Patient went to OR 09/18/2015 for I and D of her right lower extremity hematoma and plans to go to operating room again on 12/21  Subjective: - Seen postop, she is drowsy  Assessment/Plan:  Hematoma of the right lower extremity - Patient with significant swelling on right lower extremity hematoma. Patient also on chronic anticoagulation. Orthopedics has been consulted and patient was seen  in consultation by Dr. Ninfa Linden who was able to use a #10 blade at the bedside to remove necrotic skin and evacuated a very large hematoma and wound packed with wet-to-dry dressing. Patient status post I and D right leg wound measuring 8 x 13 cm with placement of medium VAC sponge in the operating room on 09/18/2015. Per orthopedics. - Would favor holding her anticoagulation for now, may be started in a week or 2 after discharge if her wound looks improved - underwent skin grafting 12/31, per Dr. Ninfa Linden she will need VAC on for 3 days post then needs to be removed and place xeroform   Acute on chronic respiratory failure - Likely multifactorial largely in part secondary to acute on chronic diastolic heart failure in the setting of probable COPD exacerbation and probable community-acquired pneumonia.  - Patient with clinical improvement.  - Cardiac enzymes negative 3. 2-D echo with EF of 55-60% with no wall motion abnormalities and severely elevated pulmonary pressures.  - Patient status post full course of antibiotics. steroid taper, nebulizers. - Her breathing appears to be a little bit more labored this morning, repeat a chest x-ray. She appears to be positive still 7 L, however she has unmeasured urine output and her weight has improved since admission, she was 244 pounds and currently at 223. Continue Lasix as well as daily weights.  Sepsis - On admission it was felt patient met criteria for sepsis given lactic acidosis, tachypnea, tachycardia, leukocytosis and felt to likely be secondary to a pulmonary source and possible urinary tract infection. Urine culture has remained negative - Patient is currently afebrile. Patient's pulmonary status complicated  by volume overload.  - Continue oral steroid taper, nebulizer treatments, s/p course of antibiotics. - Leukocytosis is likely due to steroids, improving, she is afebrile  Probable community-acquired pneumonia - Per admission. Patient  currently afebrile. Patient also with a leukocytosis of also on steroids. Patient status post full course of antibiotic treatment.  Acute on chronic diastolic heart failure - Patient currently volume overload, improving.  - Cardiac enzymes negative 3. TSH within normal limits. 2-D echo with EF of 55-60% with no wall motion abnormalities. Severely elevated pulmonary pressure. Continue oral Lasix, Lipitor, Cardizem. - I's and O's are on accurate, however her weight is trending down, 244 >> 229 - Continue Lasix 40 mg twice daily  Acute blood loss anemia Likely secondary to hematoma. Patient's hemoglobin 6.3. Transfused 2 units packed red blood cells. Hold anticoagulation. - hemoglobin stable, monitor closely post op  Hypothyroidism - TSH is 1.984. Continue home dose Synthroid.  Chronic atrial fibrillation - Continue Cardizem for rate control. Xarelto on hold secondary to large hematoma and anemia. Could probably resume Xarelto  As an outpatient  Hyperkalemia Discontinue potassium supplementation. Kayexalate.  Code Status: Full Family Communication: Updated patient. Sister bedside Disposition Plan: Hopefully to skilled nursing facility when oxygenation is at baseline and when okay with orthopedics.   Consultants:  Orthopedics: Dr. Ninfa Linden 09/14/2015  Procedures:  Chest x-ray 09/11/2015, 09/14/2015  X-ray of the tib-fib 09/11/2015  Bedside I and D and evacuation of right lower extremity hematoma 09/14/2015  2-D echo 09/14/2015  2 units packed red blood cells 09/19/2015  Antibiotics:  IV Rocephin 09/11/2015>>>> 09/15/2015  IV Azithromycin 09/11/2015>>>09/13/2015   Objective: Filed Vitals:   09/23/15 0858 09/23/15 0931  BP: 119/75 112/67  Pulse: 73 68  Temp:  98 F (36.7 C)  Resp: 14 16    Intake/Output Summary (Last 24 hours) at 09/23/15 1233 Last data filed at 09/23/15 0817  Gross per 24 hour  Intake   1030 ml  Output     20 ml  Net   1010 ml   Filed  Weights   09/21/15 1127 09/22/15 0500 09/23/15 0335  Weight: 104.9 kg (231 lb 4.2 oz) 104 kg (229 lb 4.5 oz) 101.4 kg (223 lb 8.7 oz)   Exam:  General:  Sitting in chair in no acute cardiopulmonary distress.   Cardiovascular: Irregularly irregular  Respiratory: Decvreased BS in bases. Scattered crackles. minimal wheezing. No rhonchi  Abdomen: Soft/NT/ND/+BS  Musculoskeletal: nO C/C/. 1 + Ble edema. RLE in tibial region with bandage and wound VAC on.  Neuro: non focal   Data Reviewed: Basic Metabolic Panel:  Recent Labs Lab 09/19/15 0824 09/19/15 1824 09/20/15 0354 09/21/15 0500 09/22/15 0700 09/23/15 0406  NA 139  --  137 136 136 136  K 5.8* 5.9* 5.1 4.4 4.0 4.2  CL 94*  --  93* 94* 92* 93*  CO2 36*  --  34* 35* 35* 33*  GLUCOSE 125*  --  119* 92 93 85  BUN 32*  --  39* 32* 27* 24*  CREATININE 1.35*  --  1.47* 1.33* 1.26* 1.23*  CALCIUM 8.6*  --  8.2* 8.3* 8.1* 8.2*   Liver Function Tests:  Recent Labs Lab 09/19/15 0824  AST 26  ALT 28  ALKPHOS 76  BILITOT 0.8  PROT 5.3*  ALBUMIN 2.7*   CBC:  Recent Labs Lab 09/18/15 0356 09/19/15 0824 09/19/15 2010 09/20/15 0354 09/21/15 0500 09/22/15 0700  WBC 15.8* 14.5*  --  15.9* 16.5* 13.7*  HGB 7.8* 6.3* 8.8*  8.5* 8.6* 8.5*  HCT 27.7* 23.7* 29.7* 28.5* 30.0* 29.2*  MCV 83.4 84.0  --  84.8 87.2 87.4  PLT 232 238  --  237 235 220   BNP (last 3 results)  Recent Labs  12/11/14 1958 09/11/15 1527 09/14/15 1320  BNP 1060.6* 737.3* 658.0*   CBG:  Recent Labs Lab 09/19/15 1143 09/19/15 1558 09/19/15 2119 09/20/15 0704 09/23/15 0554  GLUCAP 152* 126* 132* 99 105*   Studies: Dg Chest Port 1 View  09/22/2015  CLINICAL DATA:  Dyspnea. EXAM: PORTABLE CHEST 1 VIEW COMPARISON:  09/14/2015. FINDINGS: Stable enlarged cardiac silhouette. Mild increase in prominence of the pulmonary vasculature and interstitial markings. No visible pleural fluid. Unremarkable bones. IMPRESSION: Mildly progressive changes of  congestive heart failure. Electronically Signed   By: Claudie Revering M.D.   On: 09/22/2015 13:51   Scheduled Meds: . antiseptic oral rinse  7 mL Mouth Rinse BID  . arformoterol  15 mcg Nebulization BID  . atorvastatin  10 mg Oral QHS  . budesonide  0.25 mg Nebulization BID  . cloNIDine  0.1 mg Oral BID  . diltiazem  120 mg Oral BID  . furosemide  40 mg Oral BID  . ipratropium  0.5 mg Nebulization QID  . levalbuterol  0.63 mg Nebulization QID  . levothyroxine  100 mcg Oral QAC breakfast  . predniSONE  10 mg Oral Q breakfast  . sodium chloride  3 mL Intravenous Q12H  . sorbitol, milk of mag, mineral oil, glycerin (SMOG) enema  960 mL Rectal Once   Continuous Infusions: . lactated ringers     Principal Problem:   Acute and chronic respiratory failure with hypoxia (HCC) Active Problems:   Hypertension   Hypothyroidism   Acute on chronic diastolic CHF (congestive heart failure) (HCC)   HX: anticoagulation   Morbid obesity (HCC)   COPD exacerbation (HCC)   Sepsis ([place])   Chronic a-fib (HCC)   UTI (urinary tract infection)   Hematoma of right lower extremity   CAP (community acquired pneumonia)   Pressure ulcer   Marzetta Board M.D. [facility] Pager [phone]. If 7PM-7AM, please contact night-coverage at [email or web address], password [facility] 09/23/2015, 12:33 PM  LOS: 12 days

## 2015-09-23 NOTE — Op Note (Signed)
NAMECHAN, RACZKA                 ACCOUNT NO.:  [id number]  MEDICAL RECORD NO.:  [id number]  LOCATION:  [facility]                         FACILITY:  [facility]  PHYSICIAN:  Lind Guest. Ninfa Linden, M.D.DATE OF BIRTH:  Dec 02, 1945  DATE OF PROCEDURE:  09/23/2015 DATE OF DISCHARGE:                              OPERATIVE REPORT   PREOPERATIVE DIAGNOSIS:  Right leg wound, measuring 13 cm x 8 cm.  POSTOPERATIVE DIAGNOSIS:  Right leg wound, measuring 13 cm x 8 cm.  PROCEDURE:  Split-thickness skin graft from right thigh donor site to right leg wound.  SURGEON:  Lind Guest. Ninfa Linden, MD  ASSISTANT:  Erskine Emery, PA-C  ANESTHESIA:  General.  ANTIBIOTICS:  2 g IV Ancef.  BLOOD LOSS:  Minimal.  COMPLICATIONS:  None.  INDICATIONS:  Ms. Becknell is a 69 year old with a wound on her right leg that started as hematoma.  She has very frail skin, and on September 18, 2015, we had to perform an irrigation and debridement, removed dead skin.  She had a 13 cm x 8 cm wound, it was treated with a VAC because this will be a good granulation tissue.  She has also been medically optimized and at that point, it is time for a skin graft placement to her right leg.  The risks and benefits of surgery were explained to her and her sister, and they do understand the need to proceed with surgery given this wound.  PROCEDURE DESCRIPTION:  After informed consent was obtained, appropriate right leg was marked.  She was brought to the operating room, placed supine on the operating table.  General anesthesia was then obtained.  A bump was placed under right hip and right leg was prepped with Hibiclens and sterile drapes were applied.  Her prep was from the thigh all the way down to the toes.  Time-out was called and she was identified as a correct patient, correct right leg.  We removed the VAC already before prepping and the wound looked clean with good granulation tissue.  We only had to debride sharply with a  knife, minimal necrotic skin.  We then irrigated the leg with normal saline.  We placed a mineral oil on the thigh and then using a 3-inch Zimmer dermatome, we harvested our skin graft at 0.14 mm thickness.  We then ran this to 1-1.5 mesher and placed this over the wound.  Temporarily, we placed an epinephrine- soaked sponge over her donor site.  With the skin graft over her leg wound, we meticulously slowed down the skin graft with interrupted 4-0 Monocryl suture.  Once this was done, we placed Adaptic over the skin graft site and a VAC sponge that had a low pressure of -75 mm pressure. We then placed well-padded sterile dressing around the leg and up at the thigh, we placed Xeroform, removed the epi-soaked sponge and placed a piece of Xeroform, ABDs, Kerlix, and Ace wrap.  She was then awakened, extubated, and taken to recovery room in stable condition.  All final counts were correct.  There were no complications noted.  Of note, Erskine Emery, PA-C assisted in the entire case, his assistance was crucial for facilitating the sewing  and harvesting of the skin graft.     Lind Guest. Ninfa Linden, M.D.     CYB/MEDQ  D:  09/23/2015  T:  09/23/2015  Job:  [id number]

## 2015-09-23 NOTE — Progress Notes (Signed)
Patient ID: Caitlin Hampton, female   DOB: 18-Mar-1946, 69 y.o.   MRN: [id number] She understands fully that we are proceeding to surgery this am for a repeat I&D of her right leg wound as well as hopefully placing a split-thickness skin graft to to wound.  Informed consent is obtained.

## 2015-09-23 NOTE — Anesthesia Procedure Notes (Signed)
Procedure Name: LMA Insertion Date/Time: 09/23/2015 7:34 AM Performed by: Marinda Elk A Pre-anesthesia Checklist: Patient identified, Emergency Drugs available, Suction available, Patient being monitored and Timeout performed Patient Re-evaluated:Patient Re-evaluated prior to inductionOxygen Delivery Method: Circle system utilized Preoxygenation: Pre-oxygenation with 100% oxygen Intubation Type: IV induction Ventilation: Mask ventilation without difficulty LMA: LMA inserted LMA Size: 4.0 Number of attempts: 1 Placement Confirmation: positive ETCO2 and breath sounds checked- equal and bilateral Tube secured with: Tape Dental Injury: Teeth and Oropharynx as per pre-operative assessment

## 2015-09-23 NOTE — Transfer of Care (Signed)
Immediate Anesthesia Transfer of Care Note  Patient: Caitlin Hampton  Procedure(s) Performed: Procedure(s) with comments: SKIN GRAFT SPLIT THICKNESS RIGHT LEG (Right) - Right Thigh  Patient Location: PACU  Anesthesia Type:General  Level of Consciousness: sedated  Airway & Oxygen Therapy: Patient Spontanous Breathing and Patient connected to nasal cannula oxygen  Post-op Assessment: Report given to RN and Post -op Vital signs reviewed and stable  Post vital signs: Reviewed and stable  Last Vitals:  Filed Vitals:   09/23/15 0335 09/23/15 0549  BP: 121/87 151/52  Pulse: 73 85  Temp: 37.1 C   Resp: 20 20    Complications: No apparent anesthesia complications

## 2015-09-23 NOTE — Progress Notes (Signed)
Patient underwent skin grafting of R Leg today.  CSW will continue to monitor for medical stability and updated admissions at [facility]. They will accept patient when stable.  Question if any wound vac will be needed at d/c?  SNF will need to order vac if indicated prior to admission. Thanks!  Lorie Phenix. Pauline Good, [place]

## 2015-09-23 NOTE — Brief Op Note (Signed)
09/11/2015 - 09/23/2015  8:21 AM  PATIENT:  Caitlin Hampton  69 y.o. female  PRE-OPERATIVE DIAGNOSIS:  Right leg wound 13 cm X 8 cm  POST-OPERATIVE DIAGNOSIS:  Right leg wound  PROCEDURE:  Procedure(s) with comments: SKIN GRAFT SPLIT THICKNESS RIGHT LEG (Right) - Right Thigh  SURGEON:  Surgeon(s) and Role:    * Mcarthur Rossetti, MD - Primary  PHYSICIAN ASSISTANT: Benita Stabile, PA-C  ANESTHESIA:   general  EBL:  Total I/O In: 550 [I.V.:550] Out: 20 [Blood:20]  COUNTS:  YES  TOURNIQUET:  * No tourniquets in log *  DICTATION: .Other Dictation: Dictation Number [id number]  PLAN OF CARE: Admit to inpatient   PATIENT DISPOSITION:  PACU - hemodynamically stable.   Delay start of Pharmacological VTE agent (>24hrs) due to surgical blood loss or risk of bleeding: no

## 2015-09-23 NOTE — Progress Notes (Signed)
Patient ID: Caitlin Hampton, female   DOB: 17-Nov-1945, 69 y.o.   MRN: [id number] She did well with her right leg skin graft.  Will leave the [facility] on over the skin graft for 3 days, and then will remove the VAC and place xeroform over the wound.

## 2015-09-23 NOTE — Anesthesia Postprocedure Evaluation (Signed)
Anesthesia Post Note  Patient: Caitlin Hampton  Procedure(s) Performed: Procedure(s) (LRB): SKIN GRAFT SPLIT THICKNESS RIGHT LEG (Right)  Patient location during evaluation: PACU Anesthesia Type: General Level of consciousness: awake and alert Pain management: pain level controlled Vital Signs Assessment: post-procedure vital signs reviewed and stable Respiratory status: spontaneous breathing, nonlabored ventilation, respiratory function stable and patient connected to face mask oxygen Cardiovascular status: blood pressure returned to baseline and stable Postop Assessment: no signs of nausea or vomiting Anesthetic complications: no    Last Vitals:  Filed Vitals:   09/23/15 0335 09/23/15 0549  BP: 121/87 151/52  Pulse: 73 85  Temp: 37.1 C   Resp: 20 20    Last Pain:  Filed Vitals:   09/23/15 0839  PainSc: 0-No pain                 Zenaida Deed

## 2015-09-24 NOTE — Progress Notes (Signed)
PROGRESS NOTE  Caitlin Hampton:[id number] DOB: 02/14/1946 DOA: 09/11/2015 PCP: [name], MD  HPI / Brief interval history Patient is an 70 year old female past oral history of COPD on chronic 3 L oxygen, atrial fibrillation on chronic anticoagulation and CAD who had been having for the past 1 week increased dyspnea on exertion and nonproductive cough, but came to the emergency room on 12/19 after she was headed to see her PCP, but fell hitting her left leg which led to a large hematoma. In the emergency room, patient was found to have mild cytosis, lactic acidosis and acute respiratory failure requiring 5-6 L of oxygen. Was suspected the patient had a pneumonia causing sepsis. Chest x-ray was not very revealing, likely from dehydration. Patient started on IV antibiotics and treated with sepsis protocol with aggressive fluid resuscitation. UA came back concerning urinary tract infection. Urine cultures negative.   Patient of the last few days has slowly continued to improve.  09/13/2015 patient, needing 4-5 liters of oxygen nasal cannula she herself feels good. No complaints other than some mild right leg pain.   On 09/14/2015 patient noted to be in acute on chronic respiratory distress on a Venturi mask with sats in the high 80s to low 90s. Repeat chest x-ray done consistent with volume overload. Patient also noted to have a significantly enlarged hematoma on the right lower extremity. Patient is being diuresed and orthopedics, Dr Ninfa Linden was consulted for evacuation of right lower extremity hematoma. Patient went to OR 09/18/2015 for I and D of her right lower extremity hematoma and plans to go to operating room again on 12/21 for skin graft  Subjective: - Seen postop, she is drowsy  Assessment/Plan:  Hematoma of the right lower extremity - Patient with significant swelling on right lower extremity hematoma. Patient also on chronic anticoagulation. Orthopedics has been consulted and  patient was seen in consultation by Dr. Ninfa Linden who was able to use a #10 blade at the bedside to remove necrotic skin and evacuated a very large hematoma and wound packed with wet-to-dry dressing. Patient status post I and D right leg wound measuring 8 x 13 cm with placement of medium VAC sponge in the operating room on 09/18/2015. Per orthopedics. - Would favor holding her anticoagulation for now, may be started in a week or 2 after discharge if her wound looks improved - underwent skin grafting 12/31, per Dr. Ninfa Linden she will need VAC on for 3 days post then needs to be removed and place xeroform  - plan for SNF once wound vac is off   Acute on chronic respiratory failure - Likely multifactorial largely in part secondary to acute on chronic diastolic heart failure in the setting of probable COPD exacerbation and probable community-acquired pneumonia.  - Patient with clinical improvement.  - Cardiac enzymes negative 3. 2-D echo with EF of 55-60% with no wall motion abnormalities and severely elevated pulmonary pressures.  - Patient status post full course of antibiotics. steroid taper, nebulizers. - Her breathing appears to be a little bit more labored this morning, repeat a chest x-ray. She appears to be positive still 7 L, however she has unmeasured urine output and her weight has improved since admission, she was 244 pounds and currently at 219. Continue Lasix as well as daily weights. Renal function stable  Sepsis - On admission it was felt patient met criteria for sepsis given lactic acidosis, tachypnea, tachycardia, leukocytosis and felt to likely be secondary to a pulmonary source and possible urinary  tract infection. Urine culture has remained negative - Patient is currently afebrile. Patient's pulmonary status complicated by volume overload.  - Continue oral steroid taper, nebulizer treatments, s/p course of antibiotics. - Leukocytosis is likely due to steroids, improving, she is  afebrile  Probable community-acquired pneumonia - Per admission. Patient currently afebrile. Patient also with a leukocytosis of also on steroids. Patient status post full course of antibiotic treatment.  Acute on chronic diastolic heart failure - Patient currently volume overload, improving.  - Cardiac enzymes negative 3. TSH within normal limits. 2-D echo with EF of 55-60% with no wall motion abnormalities. Severely elevated pulmonary pressure. Continue oral Lasix, Lipitor, Cardizem. - I's and O's are on accurate, however her weight is trending down, 244 >> 229 - Continue Lasix 40 mg twice daily  Acute blood loss anemia Likely secondary to hematoma. Patient's hemoglobin 6.3. Transfused 2 units packed red blood cells. Hold anticoagulation. - hemoglobin stable, monitor closely post op  Hypothyroidism - TSH is 1.984. Continue home dose Synthroid.  Chronic atrial fibrillation - Continue Cardizem for rate control. Xarelto on hold secondary to large hematoma and anemia. Could probably resume Xarelto as an outpatient, defer to PCP / Cardiology timing  Hyperkalemia Discontinue potassium supplementation. Kayexalate.  Code Status: Full Family Communication: Updated patient. Sister bedside Disposition Plan: Hopefully to skilled nursing facility when oxygenation is at baseline and when okay with orthopedics.   Consultants:  Orthopedics: Dr. Ninfa Linden 09/14/2015  Procedures:  Chest x-ray 09/11/2015, 09/14/2015  X-ray of the tib-fib 09/11/2015  Bedside I and D and evacuation of right lower extremity hematoma 09/14/2015  2-D echo 09/14/2015  2 units packed red blood cells 09/19/2015  Antibiotics:  IV Rocephin 09/11/2015>>>> 09/15/2015  IV Azithromycin 09/11/2015>>>09/13/2015   Objective: Filed Vitals:   09/23/15 2107 09/24/15 0503  BP:  113/98  Pulse: 71 87  Temp:  98 F (36.7 C)  Resp: 20 18    Intake/Output Summary (Last 24 hours) at 09/24/15 1017 Last data filed at  09/24/15 0600  Gross per 24 hour  Intake    480 ml  Output      0 ml  Net    480 ml   Filed Weights   09/22/15 0500 09/23/15 0335 09/24/15 0503  Weight: 104 kg (229 lb 4.5 oz) 101.4 kg (223 lb 8.7 oz) 99.68 kg (219 lb 12.1 oz)   Exam:  General:  Sitting in chair in no acute cardiopulmonary distress.   Cardiovascular: Irregularly irregular  Respiratory: Decvreased BS in bases. Scattered crackles. minimal wheezing. No rhonchi  Abdomen: Soft/NT/ND/+BS  Musculoskeletal: nO C/C/. 1 + Ble edema. RLE in tibial region with bandage and wound VAC on.  Neuro: non focal   Data Reviewed: Basic Metabolic Panel:  Recent Labs Lab 09/19/15 0824 09/19/15 1824 09/20/15 0354 09/21/15 0500 09/22/15 0700 09/23/15 0406  NA 139  --  137 136 136 136  K 5.8* 5.9* 5.1 4.4 4.0 4.2  CL 94*  --  93* 94* 92* 93*  CO2 36*  --  34* 35* 35* 33*  GLUCOSE 125*  --  119* 92 93 85  BUN 32*  --  39* 32* 27* 24*  CREATININE 1.35*  --  1.47* 1.33* 1.26* 1.23*  CALCIUM 8.6*  --  8.2* 8.3* 8.1* 8.2*   Liver Function Tests:  Recent Labs Lab 09/19/15 0824  AST 26  ALT 28  ALKPHOS 76  BILITOT 0.8  PROT 5.3*  ALBUMIN 2.7*   CBC:  Recent Labs Lab 09/18/15 0356 09/19/15  5520 09/19/15 2010 09/20/15 0354 09/21/15 0500 09/22/15 0700  WBC 15.8* 14.5*  --  15.9* 16.5* 13.7*  HGB 7.8* 6.3* 8.8* 8.5* 8.6* 8.5*  HCT 27.7* 23.7* 29.7* 28.5* 30.0* 29.2*  MCV 83.4 84.0  --  84.8 87.2 87.4  PLT 232 238  --  237 235 220   BNP (last 3 results)  Recent Labs  12/11/14 1958 09/11/15 1527 09/14/15 1320  BNP 1060.6* 737.3* 658.0*   CBG:  Recent Labs Lab 09/19/15 1143 09/19/15 1558 09/19/15 2119 09/20/15 0704 09/23/15 0554  GLUCAP 152* 126* 132* 99 105*   Studies: Dg Chest Port 1 View  09/22/2015  CLINICAL DATA:  Dyspnea. EXAM: PORTABLE CHEST 1 VIEW COMPARISON:  09/14/2015. FINDINGS: Stable enlarged cardiac silhouette. Mild increase in prominence of the pulmonary vasculature and  interstitial markings. No visible pleural fluid. Unremarkable bones. IMPRESSION: Mildly progressive changes of congestive heart failure. Electronically Signed   By: Claudie Revering M.D.   On: 09/22/2015 13:51   Scheduled Meds: . antiseptic oral rinse  7 mL Mouth Rinse BID  . arformoterol  15 mcg Nebulization BID  . atorvastatin  10 mg Oral QHS  . budesonide  0.25 mg Nebulization BID  . cloNIDine  0.1 mg Oral BID  . diltiazem  120 mg Oral BID  . furosemide  40 mg Oral BID  . ipratropium  0.5 mg Nebulization QID  . levalbuterol  0.63 mg Nebulization QID  . levothyroxine  100 mcg Oral QAC breakfast  . predniSONE  10 mg Oral Q breakfast  . sodium chloride  3 mL Intravenous Q12H  . sorbitol, milk of mag, mineral oil, glycerin (SMOG) enema  960 mL Rectal Once   Continuous Infusions: . lactated ringers     Principal Problem:   Acute and chronic respiratory failure with hypoxia (HCC) Active Problems:   Hypertension   Hypothyroidism   Acute on chronic diastolic CHF (congestive heart failure) (HCC)   HX: anticoagulation   Morbid obesity (HCC)   COPD exacerbation (HCC)   Sepsis ([place])   Chronic a-fib (HCC)   UTI (urinary tract infection)   Hematoma of right lower extremity   CAP (community acquired pneumonia)   Pressure ulcer   Marzetta Board M.D. [facility] Pager [phone]. If 7PM-7AM, please contact night-coverage at [email or web address], password [facility] 09/24/2015, 10:17 AM  LOS: 13 days

## 2015-09-24 NOTE — Progress Notes (Signed)
Patient ID: Caitlin Hampton, female   DOB: 1946-06-09, 70 y.o.   MRN: [id number] [facility] with good seal over right leg skin graft.  Will remove the [facility] tomorrow and treat with just daily xeroform and dry dressings.  Can likely go to SNF by tomorrow afternoon.

## 2015-09-25 ENCOUNTER — Inpatient Hospital Stay (HOSPITAL_COMMUNITY): Payer: Medicare Other

## 2015-09-25 LAB — CBC
HCT: 32.4 % — ABNORMAL LOW (ref 36.0–46.0)
HEMOGLOBIN: 9.3 g/dL — AB (ref 12.0–15.0)
MCH: 25.3 pg — AB (ref 26.0–34.0)
MCHC: 28.7 g/dL — AB (ref 30.0–36.0)
MCV: 88 fL (ref 78.0–100.0)
PLATELETS: 216 10*3/uL (ref 150–400)
RBC: 3.68 MIL/uL — ABNORMAL LOW (ref 3.87–5.11)
RDW: 21.4 % — AB (ref 11.5–15.5)
WBC: 18.2 10*3/uL — ABNORMAL HIGH (ref 4.0–10.5)

## 2015-09-25 LAB — BLOOD GAS, ARTERIAL
Acid-Base Excess: 8.7 mmol/L — ABNORMAL HIGH (ref 0.0–2.0)
BICARBONATE: 33.8 meq/L — AB (ref 20.0–24.0)
DRAWN BY: 12971
FIO2: 0.6
LHR: 10 {breaths}/min
O2 SAT: 97.5 %
PEEP: 8 cmH2O
PH ART: 7.384 (ref 7.350–7.450)
PIP: 6 cmH2O
Patient temperature: 98.6
TCO2: 35.6 mmol/L (ref 0–100)
pCO2 arterial: 57.9 mmHg (ref 35.0–45.0)
pO2, Arterial: 102 mmHg — ABNORMAL HIGH (ref 80.0–100.0)

## 2015-09-25 LAB — POCT I-STAT 3, ART BLOOD GAS (G3+)
ACID-BASE EXCESS: 12 mmol/L — AB (ref 0.0–2.0)
ACID-BASE EXCESS: 13 mmol/L — AB (ref 0.0–2.0)
ACID-BASE EXCESS: 13 mmol/L — AB (ref 0.0–2.0)
BICARBONATE: 39 meq/L — AB (ref 20.0–24.0)
Bicarbonate: 38.6 mEq/L — ABNORMAL HIGH (ref 20.0–24.0)
Bicarbonate: 39.3 mEq/L — ABNORMAL HIGH (ref 20.0–24.0)
O2 SAT: 95 %
O2 SAT: 90 %
O2 Saturation: 62 %
PCO2 ART: 56.8 mmHg — AB (ref 35.0–45.0)
PH ART: 7.446 (ref 7.350–7.450)
Patient temperature: 98.6
TCO2: 40 mmol/L (ref 0–100)
TCO2: 41 mmol/L (ref 0–100)
TCO2: 41 mmol/L (ref 0–100)
pCO2 arterial: 57.3 mmHg (ref 35.0–45.0)
pCO2 arterial: 56.6 mmHg — ABNORMAL HIGH (ref 35.0–45.0)
pH, Arterial: 7.436 (ref 7.350–7.450)
pH, Arterial: 7.449 (ref 7.350–7.450)
pO2, Arterial: 75 mmHg — ABNORMAL LOW (ref 80.0–100.0)
pO2, Arterial: 32 mmHg — CL (ref 80.0–100.0)
pO2, Arterial: 57 mmHg — ABNORMAL LOW (ref 80.0–100.0)

## 2015-09-25 LAB — URINALYSIS, ROUTINE W REFLEX MICROSCOPIC
BILIRUBIN URINE: NEGATIVE
Glucose, UA: NEGATIVE mg/dL
Ketones, ur: NEGATIVE mg/dL
Leukocytes, UA: NEGATIVE
Nitrite: NEGATIVE
PH: 6.5 (ref 5.0–8.0)
Protein, ur: NEGATIVE mg/dL
SPECIFIC GRAVITY, URINE: 1.011 (ref 1.005–1.030)

## 2015-09-25 LAB — LACTIC ACID, PLASMA: Lactic Acid, Venous: 1.1 mmol/L (ref 0.5–2.0)

## 2015-09-25 LAB — PROTIME-INR
INR: 1.13 (ref 0.00–1.49)
PROTHROMBIN TIME: 14.7 s (ref 11.6–15.2)

## 2015-09-25 LAB — URINE MICROSCOPIC-ADD ON
BACTERIA UA: NONE SEEN
SQUAMOUS EPITHELIAL / LPF: NONE SEEN

## 2015-09-25 LAB — BASIC METABOLIC PANEL
ANION GAP: 10 (ref 5–15)
BUN: 30 mg/dL — AB (ref 6–20)
CALCIUM: 8.5 mg/dL — AB (ref 8.9–10.3)
CO2: 35 mmol/L — AB (ref 22–32)
CREATININE: 1.44 mg/dL — AB (ref 0.44–1.00)
Chloride: 90 mmol/L — ABNORMAL LOW (ref 101–111)
GFR calc Af Amer: 42 mL/min — ABNORMAL LOW (ref >60)
GFR, EST NON AFRICAN AMERICAN: 36 mL/min — AB (ref >60)
GLUCOSE: 113 mg/dL — AB (ref 65–99)
Potassium: 4.5 mmol/L (ref 3.5–5.1)
Sodium: 135 mmol/L (ref 135–145)

## 2015-09-25 LAB — PROCALCITONIN: Procalcitonin: 0.6 ng/mL

## 2015-09-25 LAB — TROPONIN I
Troponin I: 0.11 ng/mL — ABNORMAL HIGH (ref <0.031)
Troponin I: 0.11 ng/mL — ABNORMAL HIGH (ref <0.031)

## 2015-09-25 LAB — APTT: aPTT: 31 seconds (ref 24–37)

## 2015-09-25 MED ORDER — FUROSEMIDE 10 MG/ML IJ SOLN
40.0000 mg | Freq: Once | INTRAMUSCULAR | Status: AC
  Administered 2015-09-25: 40 mg via INTRAVENOUS
  Filled 2015-09-25: qty 4

## 2015-09-25 MED ORDER — METHYLPREDNISOLONE SODIUM SUCC 125 MG IJ SOLR
40.0000 mg | Freq: Two times a day (BID) | INTRAMUSCULAR | Status: DC
  Administered 2015-09-25 – 2015-09-28 (×6): 40 mg via INTRAVENOUS
  Filled 2015-09-25 (×6): qty 2

## 2015-09-25 MED ORDER — HEPARIN (PORCINE) IN NACL 100-0.45 UNIT/ML-% IJ SOLN
1150.0000 [IU]/h | INTRAMUSCULAR | Status: DC
  Administered 2015-09-25: 1150 [IU]/h via INTRAVENOUS
  Filled 2015-09-25 (×2): qty 250

## 2015-09-25 MED ORDER — MORPHINE SULFATE (PF) 2 MG/ML IV SOLN
0.5000 mg | Freq: Once | INTRAVENOUS | Status: AC
  Administered 2015-09-25: 0.5 mg via INTRAVENOUS
  Filled 2015-09-25: qty 1

## 2015-09-25 MED ORDER — FENTANYL CITRATE (PF) 100 MCG/2ML IJ SOLN
INTRAMUSCULAR | Status: AC
  Filled 2015-09-25: qty 2

## 2015-09-25 MED ORDER — FENTANYL CITRATE (PF) 100 MCG/2ML IJ SOLN: 50.0000 ug | Freq: Once | INTRAMUSCULAR | Status: AC

## 2015-09-25 MED ORDER — LORAZEPAM 2 MG/ML IJ SOLN
1.0000 mg | Freq: Once | INTRAMUSCULAR | Status: AC
  Administered 2015-09-25: 1 mg via INTRAVENOUS
  Filled 2015-09-25: qty 1

## 2015-09-25 MED ORDER — SODIUM CHLORIDE 0.9 % IV SOLN
INTRAVENOUS | Status: DC
  Administered 2015-09-25: 17:00:00 via INTRAVENOUS
  Administered 2015-09-27: 40 mL/h via INTRAVENOUS
  Administered 2015-09-28: via INTRAVENOUS

## 2015-09-25 MED ORDER — SENNOSIDES-DOCUSATE SODIUM 8.6-50 MG PO TABS
1.0000 | ORAL_TABLET | Freq: Every day | ORAL | Status: DC
  Administered 2015-09-26 – 2015-09-30 (×5): 1 via ORAL
  Filled 2015-09-25 (×6): qty 1

## 2015-09-25 MED ORDER — FENTANYL CITRATE (PF) 100 MCG/2ML IJ SOLN
100.0000 ug | Freq: Once | INTRAMUSCULAR | Status: AC
  Administered 2015-09-25: 100 ug via INTRAVENOUS

## 2015-09-25 MED ORDER — FENTANYL BOLUS VIA INFUSION
25.0000 ug | INTRAVENOUS | Status: DC | PRN
  Filled 2015-09-25: qty 25

## 2015-09-25 MED ORDER — MIDAZOLAM HCL 2 MG/2ML IJ SOLN: 1.0000 mg | INTRAMUSCULAR | Status: DC | PRN

## 2015-09-25 MED ORDER — HEPARIN BOLUS VIA INFUSION
1500.0000 [IU] | Freq: Once | INTRAVENOUS | Status: AC
  Administered 2015-09-25: 1500 [IU] via INTRAVENOUS
  Filled 2015-09-25: qty 1500

## 2015-09-25 MED ORDER — LORAZEPAM 2 MG/ML IJ SOLN
INTRAMUSCULAR | Status: AC
  Filled 2015-09-25: qty 1

## 2015-09-25 MED ORDER — ANTISEPTIC ORAL RINSE SOLUTION (CORINZ)
7.0000 mL | Freq: Four times a day (QID) | OROMUCOSAL | Status: DC
  Administered 2015-09-25 – 2015-09-27 (×4): 7 mL via OROMUCOSAL

## 2015-09-25 MED ORDER — MIDAZOLAM HCL 2 MG/2ML IJ SOLN
2.0000 mg | Freq: Once | INTRAMUSCULAR | Status: AC
  Administered 2015-09-25: 2 mg via INTRAVENOUS

## 2015-09-25 MED ORDER — MIDAZOLAM HCL 2 MG/2ML IJ SOLN
1.0000 mg | INTRAMUSCULAR | Status: DC | PRN
  Administered 2015-09-25: 1 mg via INTRAVENOUS
  Filled 2015-09-25: qty 2

## 2015-09-25 MED ORDER — BUDESONIDE 0.25 MG/2ML IN SUSP
0.5000 mg | Freq: Two times a day (BID) | RESPIRATORY_TRACT | Status: DC
  Administered 2015-09-25 – 2015-10-03 (×15): 0.5 mg via RESPIRATORY_TRACT
  Filled 2015-09-25 (×18): qty 4

## 2015-09-25 MED ORDER — SENNOSIDES 8.8 MG/5ML PO SYRP
5.0000 mL | ORAL_SOLUTION | Freq: Two times a day (BID) | ORAL | Status: DC | PRN
  Filled 2015-09-25: qty 5

## 2015-09-25 MED ORDER — SODIUM CHLORIDE 0.9 % IV SOLN
25.0000 ug/h | INTRAVENOUS | Status: DC
  Administered 2015-09-25 – 2015-09-26 (×2): 100 ug/h via INTRAVENOUS
  Filled 2015-09-25 (×2): qty 50

## 2015-09-25 MED ORDER — ROCURONIUM BROMIDE 50 MG/5ML IV SOLN
60.0000 mg/kg | Freq: Once | INTRAVENOUS | Status: AC
  Administered 2015-09-25: 60 mg via INTRAVENOUS
  Filled 2015-09-25: qty 612.6

## 2015-09-25 MED ORDER — LORAZEPAM 2 MG/ML IJ SOLN
0.5000 mg | INTRAMUSCULAR | Status: DC | PRN
  Administered 2015-09-25: 1 mg via INTRAVENOUS

## 2015-09-25 MED ORDER — CHLORHEXIDINE GLUCONATE 0.12% ORAL RINSE (MEDLINE KIT)
15.0000 mL | Freq: Two times a day (BID) | OROMUCOSAL | Status: DC
  Administered 2015-09-25 – 2015-09-27 (×5): 15 mL via OROMUCOSAL

## 2015-09-25 MED ORDER — ETOMIDATE 2 MG/ML IV SOLN
20.0000 mg/kg | Freq: Once | INTRAVENOUS | Status: AC
  Administered 2015-09-25: 20 mg via INTRAVENOUS
  Filled 2015-09-25: qty 1021

## 2015-09-25 MED ORDER — POLYETHYLENE GLYCOL 3350 17 G PO PACK
17.0000 g | PACK | Freq: Every day | ORAL | Status: DC
  Administered 2015-09-26 – 2015-10-03 (×7): 17 g via ORAL
  Filled 2015-09-25 (×8): qty 1

## 2015-09-25 MED ORDER — MIDAZOLAM HCL 2 MG/2ML IJ SOLN
INTRAMUSCULAR | Status: AC
  Filled 2015-09-25: qty 2

## 2015-09-25 NOTE — Plan of Care (Signed)
Attempting to get OOB, education done about staying in bed and safety

## 2015-09-25 NOTE — Progress Notes (Signed)
PROGRESS NOTE  Caitlin Hampton QAS:[id number] DOB: 1946-02-14 DOA: 09/11/2015 PCP: [name], MD  HPI / Brief interval history Patient is an 70 year old female past oral history of COPD on chronic 3 L oxygen, atrial fibrillation on chronic anticoagulation and CAD who had been having for the past 1 week increased dyspnea on exertion and nonproductive cough, but came to the emergency room on 12/19 after she was headed to see her PCP, but fell hitting her left leg which led to a large hematoma. In the emergency room, patient was found to have mild cytosis, lactic acidosis and acute respiratory failure requiring 5-6 L of oxygen. Was suspected the patient had a pneumonia causing sepsis. Chest x-ray was not very revealing, likely from dehydration. Patient started on IV antibiotics and treated with sepsis protocol with aggressive fluid resuscitation. UA came back concerning urinary tract infection. Urine cultures negative.   Patient of the last few days has slowly continued to improve.  09/13/2015 patient, needing 4-5 liters of oxygen nasal cannula she herself feels good. No complaints other than some mild right leg pain.   On 09/14/2015 patient noted to be in acute on chronic respiratory distress on a Venturi mask with sats in the high 80s to low 90s. Repeat chest x-ray done consistent with volume overload. Patient also noted to have a significantly enlarged hematoma on the right lower extremity. Patient is being diuresed and orthopedics, Dr Ninfa Linden was consulted for evacuation of right lower extremity hematoma. Patient went to OR 09/18/2015 for I and D of her right lower extremity hematoma and plans to go to operating room again on 12/31 for skin graft  On 09/25/15 patient in respiratory distress complaining of dyspnea and with agitation. PCCM consulted   Subjective: - dyspneic, tachypneic, using accessory muscles to breathe, states that she "cant breathe"  Assessment/Plan:  Acute on chronic  hypoxic respiratory failure - Likely multifactorial largely in part secondary to acute on chronic diastolic heart failure in the setting of probable COPD exacerbation and probable community-acquired pneumonia.  - Cardiac enzymes negative 3. 2-D echo with EF of 55-60% with no wall motion abnormalities and severely elevated pulmonary pressures.  - Patient status post full course of antibiotics. steroid taper, nebulizers.  - patient with respiratory decompensation this morning, using accessory muscles - differential pulmonary edema vs PE vs worsening COPD, she has been off anticoagulation given RLE hematoma and repeated surgeries  - she was treated for HCAP and COPD exacerbation now on a prednisone taper s/p competed antibiotic course - she has been on daily Lasix - weights have been trending down up until yesterday, I&Os are not accurate since she has multiple undocumented UOP - patient with difficulties tolerating NRB and BiPAP, continues to complain of air hunger, PCCM consulted, appreciate input  - STAT ABG, CXR, Lasix 80 mg. WBC noted to be up to 18K, but on steroids. She is afebrile. F/u CXR to determine Abx  Hematoma of the right lower extremity - Patient with significant swelling on right lower extremity hematoma. Patient also on chronic anticoagulation. Orthopedics has been consulted and patient was seen in consultation by Dr. Ninfa Linden who was able to use a #10 blade at the bedside to remove necrotic skin and evacuated a very large hematoma and wound packed with wet-to-dry dressing. Patient status post I and D right leg wound measuring 8 x 13 cm with placement of medium VAC sponge in the operating room on 09/18/2015. Per orthopedics. - Would favor holding her anticoagulation for now, may  be started in a week or 2 after discharge if her wound looks improved - underwent skin grafting 12/31, per Dr. Karena Addison removed this morning and placed xeroform   Sepsis - On admission it was felt  patient met criteria for sepsis given lactic acidosis, tachypnea, tachycardia, leukocytosis and felt to likely be secondary to a pulmonary source and possible urinary tract infection. Urine culture has remained negative  Probable community-acquired pneumonia - Per admission. Patient currently afebrile. Patient also with a leukocytosis of also on steroids. Patient status post full course of antibiotic treatment.  Acute on chronic diastolic heart failure - Patient currently volume overload, improving.  - Cardiac enzymes negative 3. TSH within normal limits. 2-D echo with EF of 55-60% with no wall motion abnormalities. Severely elevated pulmonary pressure. Continue oral Lasix, Lipitor, Cardizem. - I's and O's are not accurate, - see #1  Acute blood loss anemia Likely secondary to hematoma. Patient's hemoglobin 6.3. Transfused 2 units packed red blood cells. Hold anticoagulation. - hemoglobin stable, monitor closely post op  Hypothyroidism - TSH is 1.984. Continue home dose Synthroid.  Chronic atrial fibrillation - Continue Cardizem for rate control. Xarelto on hold secondary to large hematoma and anemia. Could probably resume Xarelto as an outpatient, defer to PCP / Cardiology timing  Hyperkalemia Discontinue potassium supplementation. Kayexalate.  Code Status: Full Family Communication: no family bedside Disposition Plan: PCCM contsult, move to SDU/ICU  Consultants:  Orthopedics: Dr. Ninfa Linden 09/14/2015  PCCM 1/2  Procedures:  Chest x-ray 09/11/2015, 09/14/2015 and 09/25/15  X-ray of the tib-fib 09/11/2015  Bedside I and D and evacuation of right lower extremity hematoma 09/14/2015  2-D echo 09/14/2015  2 units packed red blood cells 09/19/2015  OR for skin grafting 12/31  Antibiotics:  IV Rocephin 09/11/2015>>>> 09/15/2015  IV Azithromycin 09/11/2015>>>09/13/2015  Objective: Filed Vitals:   09/25/15 0419 09/25/15 0847  BP: 141/79 167/74  Pulse: 89 109  Temp:  98.6 F (37 C)   Resp: 20     Intake/Output Summary (Last 24 hours) at 09/25/15 0854 Last data filed at 09/25/15 0200  Gross per 24 hour  Intake    723 ml  Output      0 ml  Net    723 ml   Filed Weights   09/23/15 0335 09/24/15 0503 09/25/15 0419  Weight: 101.4 kg (223 lb 8.7 oz) 99.68 kg (219 lb 12.1 oz) 102.1 kg (225 lb 1.4 oz)   Exam:  General: appears distressed, struggling to breathe  Cardiovascular: Irregularly irregular  Respiratory: Decvreased BS in bases. Scattered crackles. minimal wheezing. No rhonchi  Abdomen: Soft/NT/ND/+BS  Musculoskeletal: nO C/C/. 1 + Ble edema. RLE in tibial region with bandage  Neuro: non focal   Psych: agitated  Data Reviewed: Basic Metabolic Panel:  Recent Labs Lab 09/20/15 0354 09/21/15 0500 09/22/15 0700 09/23/15 0406 09/25/15 0431  NA 137 136 136 136 135  K 5.1 4.4 4.0 4.2 4.5  CL 93* 94* 92* 93* 90*  CO2 34* 35* 35* 33* 35*  GLUCOSE 119* 92 93 85 113*  BUN 39* 32* 27* 24* 30*  CREATININE 1.47* 1.33* 1.26* 1.23* 1.44*  CALCIUM 8.2* 8.3* 8.1* 8.2* 8.5*   Liver Function Tests:  Recent Labs Lab 09/19/15 0824  AST 26  ALT 28  ALKPHOS 76  BILITOT 0.8  PROT 5.3*  ALBUMIN 2.7*   CBC:  Recent Labs Lab 09/19/15 0824 09/19/15 2010 09/20/15 0354 09/21/15 0500 09/22/15 0700 09/25/15 0431  WBC 14.5*  --  15.9* 16.5* 13.7*  18.2*  HGB 6.3* 8.8* 8.5* 8.6* 8.5* 9.3*  HCT 23.7* 29.7* 28.5* 30.0* 29.2* 32.4*  MCV 84.0  --  84.8 87.2 87.4 88.0  PLT 238  --  237 235 220 216   BNP (last 3 results)  Recent Labs  12/11/14 1958 09/11/15 1527 09/14/15 1320  BNP 1060.6* 737.3* 658.0*   CBG:  Recent Labs Lab 09/19/15 1143 09/19/15 1558 09/19/15 2119 09/20/15 0704 09/23/15 0554  GLUCAP 152* 126* 132* 99 105*   Studies: No results found. Scheduled Meds: . antiseptic oral rinse  7 mL Mouth Rinse BID  . arformoterol  15 mcg Nebulization BID  . atorvastatin  10 mg Oral QHS  . budesonide  0.25 mg  Nebulization BID  . cloNIDine  0.1 mg Oral BID  . diltiazem  120 mg Oral BID  . ipratropium  0.5 mg Nebulization QID  . levalbuterol  0.63 mg Nebulization QID  . levothyroxine  100 mcg Oral QAC breakfast  . predniSONE  10 mg Oral Q breakfast  . sodium chloride  3 mL Intravenous Q12H  . sorbitol, milk of mag, mineral oil, glycerin (SMOG) enema  960 mL Rectal Once   Continuous Infusions: . lactated ringers     Principal Problem:   Acute and chronic respiratory failure with hypoxia (HCC) Active Problems:   Hypertension   Hypothyroidism   Acute on chronic diastolic CHF (congestive heart failure) (HCC)   HX: anticoagulation   Morbid obesity (HCC)   COPD exacerbation (HCC)   Sepsis ([place])   Chronic a-fib (HCC)   UTI (urinary tract infection)   Hematoma of right lower extremity   CAP (community acquired pneumonia)   Pressure ulcer   Marzetta Board M.D. [facility] Pager [phone]. If 7PM-7AM, please contact night-coverage at [email or web address], password [facility] 09/25/2015, 8:54 AM  LOS: 14 days

## 2015-09-25 NOTE — Progress Notes (Signed)
MD approached RN stating patient was having shortness of breath. MD called respiratory to come evaluate pt. Rapid response notified. Patient on 6 L of O2 stating she needs more air, repositioned and venti mask applied. Rapid arrived to assess patient. Respiratory arrived to give breathing treatment. Pt was placed on BiPap. Gave 0.5 mg of morphine and 1 mg of ativan as ordered. MD called patient's family to update, and patient was transferred to 2S.

## 2015-09-25 NOTE — Plan of Care (Signed)
Caitlin Hampton called re: agitation, pulling off leg dressing, pulling off bipap

## 2015-09-25 NOTE — Progress Notes (Signed)
Patient ID: Caitlin Hampton, female   DOB: Oct 21, 1945, 70 y.o.   MRN: [id number] Dressing changed on right leg.  Skin graft intact.  Xeroform and new dressing applied.  Will discontinue the VAC at this standpoint.  Still needs her heel floating while in bed due to a pressure wound on her heel.

## 2015-09-25 NOTE — Progress Notes (Signed)
ANTICOAGULATION CONSULT NOTE - Initial Consult  Pharmacy Consult for Heparin Indication: pulmonary embolus  No Known Allergies  Patient Measurements: Height: 5\' 4"  (162.6 cm) Weight: 225 lb 1.4 oz (102.1 kg) (bedscale) IBW/kg (Calculated) : 54.7 Heparin Dosing Weight: 79  Vital Signs: Temp: 98.1 F (36.7 C) (01/02 1952) Temp Source: Axillary (01/02 1952) BP: 117/68 mmHg (01/02 2027) Pulse Rate: 85 (01/02 2027)  Labs:  Recent Labs  09/23/15 0406 09/25/15 0431 09/25/15 1700  HGB  --  9.3*  --   HCT  --  32.4*  --   PLT  --  216  --   CREATININE 1.23* 1.44*  --   TROPONINI  --   --  0.11*    Estimated Creatinine Clearance: 42.9 mL/min (by C-G formula based on Cr of 1.44).   Medical History: Past Medical History  Diagnosis Date  . Hypertension   . Thyroid disease   . COPD (chronic obstructive pulmonary disease) ([place])   . Anxiety   . H/O hiatal hernia   . Hypothyroidism   . Shortness of breath   . Atrial flutter ([place]) 09/07/2012    , RVR/ hospital 12/13- rate control and Echo normal- on xarelto, when spontaneously converted she had bradycardia on Metoprolol and Digoxin  . GERD (gastroesophageal reflux disease)   . Rhinitis   . Parotitis     , on CT of face- 12/13  . CHF (congestive heart failure) ([place])   . Hx of echocardiogram     Echo (8/15):  EF 60-65%  . NSTEMI (non-ST elevated myocardial infarction) ([place])   . CAP (community acquired pneumonia) 09/14/2015    Medications:  Scheduled:  . antiseptic oral rinse  7 mL Mouth Rinse BID  . antiseptic oral rinse  7 mL Mouth Rinse QID  . arformoterol  15 mcg Nebulization BID  . atorvastatin  10 mg Oral QHS  . budesonide  0.5 mg Nebulization BID  . chlorhexidine gluconate  15 mL Mouth/Throat BID  . cloNIDine  0.1 mg Oral BID  . diltiazem  120 mg Oral BID  . ipratropium  0.5 mg Nebulization QID  . levalbuterol  0.63 mg Nebulization QID  . levothyroxine  100 mcg Oral QAC breakfast  . methylPREDNISolone  (SOLU-MEDROL) injection  40 mg Intravenous Q12H  . polyethylene glycol  17 g Oral Daily  . senna-docusate  1 tablet Oral Daily  . sodium chloride  3 mL Intravenous Q12H  . sorbitol, milk of mag, mineral oil, glycerin (SMOG) enema  960 mL Rectal Once    Assessment: 70yo female admitted 12/20 with COPD & SOB; she had been continued on Xarelto 20mg  daily and developed a RLE hematoma, after a fall, which required drainage and packing.  Xarelto was held, last dose was on 12/22.  This AM, she was found to be in respiratory distress & was transferred to ICU.  She cannot get a CTA d/t elevated Cr but with ECHO (+)new pulmonary HTN, she is to be treated for R/O PE.  Will give a reduced bolus d/t recent bleeding.  Hg 9.3 ~stable/improving since last transfusion on 12/27  Pltc 216 Cr 1.44  Goal of Therapy:  Heparin level 0.3-0.7 units/ml Monitor platelets by anticoagulation protocol: Yes   Plan:  Heparin 1500 units IV x 1, then 1150 units/hr Heparin level 8hr Daily HL, CBC Watch closely for s/s of bleeding  Gracy Bruins, [facility]

## 2015-09-25 NOTE — Progress Notes (Signed)
Ollis NP aware of pt's agitation

## 2015-09-25 NOTE — Plan of Care (Signed)
Ollis NP ordered for patient to be restrained

## 2015-09-25 NOTE — Clinical Social Work Note (Signed)
Covering CSW received verbal handoff from prior CSW. Patient to be discharged to [facility] once medically stable for discharge.  CSW to continue to follow and assist with discharge planning needs.  Lubertha Sayres, [place] [facility]: [phone] Surgical: [phone]

## 2015-09-25 NOTE — Care Management Important Message (Signed)
Important Message  Patient Details  Name: EMMELIA MELERINE MRN: [id number] Date of Birth: April 16, 1946   Medicare Important Message Given:  Yes    Louanne Belton 09/25/2015, 12:18 [place] Message  Patient Details  Name: TURNER HOUSEMAN MRN: [id number] Date of Birth: 10/20/1945   Medicare Important Message Given:  Yes    Nameer Summer, Neal Dy 09/25/2015, 12:18 PM

## 2015-09-25 NOTE — Progress Notes (Addendum)
RT note-Called for respiratory distress with Rapid response. Patient ws on NRB and receiving a nebulizer. Bipap placed and ABG drawn post 10 min of NIV. Patient is very anxious and restless, but can answer questions. CCM was consulted, patient transferred to ICU on [place]. HR 124, RR-30 sp02 from ABG 95%.

## 2015-09-25 NOTE — Progress Notes (Signed)
Noe Gens, NP notified of blood gas results. No new orders received.

## 2015-09-25 NOTE — Consult Note (Signed)
PULMONARY / CRITICAL CARE MEDICINE   Name: Caitlin Hampton MRN: [id number] DOB: 10-01-1945    ADMISSION DATE:  09/11/2015 CONSULTATION DATE:  09/25/15  REFERRING MD:  Dr. Cruzita Lederer  CHIEF COMPLAINT:  SOB  HISTORY OF PRESENT ILLNESS:   70 y/o F with PMH of HTN, hypothyroidism, 3L O2 dependent COPD, pulmonary hypertension (as seen on ECHO, PAs 66 08/2015), anxiety, hiatal hernia, GERD, CAD, CHF, NSTEMI, atrial flutter on Xarelto who was admitted on 12/19 for increased dyspnea, non-productive cough, and fall with RLE contusion / hematoma.    The patient was on her way to her PCP for evaluation of shortness of breath when she fell striking her leg sustaining a large contusion to her right shin subsequently developing a very large hematoma on RLE.  She then proceeded to the ER for evaluation of her lower extremity and shortness of breath.  She reported progressive SOB for several days with a productive cough with green/yellow sputum.  She also noted occasional flecks of blood in her sputum.     The patient was found to have a mild leukocytosis, anemia, lactic acidosis (2.5 highest) and requiring 5-6 liters of O2 (up from her baseline).  CXR was negative for acute infiltrate on admit.  She had respiratory distress and was treated with BiPAP.  Follow up ABG within normal limits and BiPAP discontinued.  UA was concerning for UTI.  She was treated with antibiotics for UTI and concern for possible PNA.  She apparently was slowly improving but on 12/22 developed acute respiratory distress with saturations in the high 80's.  CXR at that time was consistent with volume overload.  She was diuresed with improvement. Orthopedics were consulted for evaluation of RLE hematoma / eschar.  She underwent ID of RLE hematoma (13 cm x 8 cm) on 12/26 and skin graft on 12/31.  Wound VAC was placed on RLE post procedure.   Am of 1/2, the patient was noted to be very anxious, tachypneic and use of accessory muscles.  The patient  reported she felt as though she "couldnt breathe".  Rapid response was called to bedside and patient placed on BiPAP.  PCCM called for consultation.  Wound VAC removed 1/2 am per Dr. Ninfa Linden.      PAST MEDICAL HISTORY :  She  has a past medical history of Hypertension; Thyroid disease; COPD (chronic obstructive pulmonary disease) ([place]); Anxiety; H/O hiatal hernia; Hypothyroidism; Shortness of breath; Atrial flutter ([place]) (09/07/2012); GERD (gastroesophageal reflux disease); Rhinitis; Parotitis; CHF (congestive heart failure) ([place]); echocardiogram; NSTEMI (non-ST elevated myocardial infarction) ([place]); and CAP (community acquired pneumonia) (09/14/2015).  PAST SURGICAL HISTORY: She  has past surgical history that includes Tonsillectomy; Breast surgery; Cardioversion (N/A, 04/02/2014); Cardioversion (N/A, 08/17/2014); I&D extremity (Right, 99991111); and Application if wound vac (Right, 09/18/2015).  No Known Allergies  No current facility-administered medications on file prior to encounter.   Current Outpatient Prescriptions on File Prior to Encounter  Medication Sig  . acetaminophen (TYLENOL) 500 MG tablet Take 1,000 mg by mouth every 6 (six) hours as needed for mild pain.  Marland Kitchen albuterol (PROVENTIL HFA;VENTOLIN HFA) 108 (90 BASE) MCG/ACT inhaler Inhale 2 puffs into the lungs every 6 (six) hours as needed for wheezing or shortness of breath.  Marland Kitchen albuterol (PROVENTIL) (2.5 MG/3ML) 0.083% nebulizer solution Take 2.5 mg by nebulization every 6 (six) hours as needed for wheezing or shortness of breath.  Marland Kitchen atorvastatin (LIPITOR) 10 MG tablet Take 10 mg by mouth daily.   . budesonide (PULMICORT) 0.25 MG/2ML  nebulizer solution Take 2 mLs (0.25 mg total) by nebulization 2 (two) times daily.  Marland Kitchen diltiazem (CARDIZEM CD) 120 MG 24 hr capsule Take 120 mg by mouth 2 (two) times daily.  . furosemide (LASIX) 40 MG tablet Take 40 mg by mouth 2 (two) times daily.  . iron polysaccharides (NIFEREX) 150 MG capsule  Take 1 capsule (150 mg total) by mouth 2 (two) times daily.  Marland Kitchen levothyroxine (SYNTHROID, LEVOTHROID) 100 MCG tablet Take 100 mcg by mouth daily.  . potassium chloride SA (K-DUR,KLOR-CON) 20 MEQ tablet TAKE ONE TABLET EACH DAY (Patient taking differently: take one half tablet each day with furosemide)  . Rivaroxaban (XARELTO) 20 MG TABS Take 1 tablet (20 mg total) by mouth daily with breakfast.  . tiotropium (SPIRIVA) 18 MCG inhalation capsule Place 18 mcg into inhaler and inhale daily.    FAMILY HISTORY:  Her indicated that her mother is deceased. She indicated that her father is deceased. She indicated that her sister is alive. She indicated that her maternal grandmother is deceased. She indicated that her maternal grandfather is deceased. She indicated that her paternal grandmother is deceased. She indicated that her paternal grandfather is deceased.   SOCIAL HISTORY: She  reports that she quit smoking about 3 years ago. Her smoking use included Cigarettes. She smoked 1.50 packs per day. She does not have any smokeless tobacco history on file. She reports that she does not drink alcohol or use illicit drugs.  REVIEW OF SYSTEMS:   Unable to complete as patient is on BiPAP.    SUBJECTIVE: RRT RN reports pt with significant anxiety - no change in breathing before / after bipap initiation.  Pt unable to be still in bed.     VITAL SIGNS: BP 167/74 mmHg  Pulse 109  Temp(Src) 101.5 F (38.6 C) (Rectal)  Resp 36  Ht 5\' 4"  (1.626 m)  Wt 225 lb 1.4 oz (102.1 kg)  BMI 38.62 kg/m2  SpO2 95%  HEMODYNAMICS:    VENTILATOR SETTINGS: Vent Mode:  [-] BIPAP FiO2 (%):  [60 %-100 %] 60 % Set Rate:  [10 bmp] 10 bmp PEEP:  [8 cmH20] 8 cmH20  INTAKE / OUTPUT: I/O last 3 completed shifts: In: 65 [P.O.:960; I.V.:3] Out: 0   PHYSICAL EXAMINATION: General:  Chronically ill appearing female on BiPAP PSY:  Appears anxious, unable to be still  Neuro:  Awake, alert, MAE, speech clear through mask,  answers with one word  HEENT:  MM pink/dry, no jvd Cardiovascular:  s1s2 irr irr, no m/r/g Lungs:  Tachypnea, lungs bilaterally diminished with wheezing  Abdomen:  Distended, bsx4 active  Musculoskeletal:  No acute deformities, RLE wrapped with ACE dressing, c/d/i Skin:  Warm/dry, thin skin, multiple bruises  LABS:  BMET  Recent Labs Lab 09/22/15 0700 09/23/15 0406 09/25/15 0431  NA 136 136 135  K 4.0 4.2 4.5  CL 92* 93* 90*  CO2 35* 33* 35*  BUN 27* 24* 30*  CREATININE 1.26* 1.23* 1.44*  GLUCOSE 93 85 113*    Electrolytes  Recent Labs Lab 09/22/15 0700 09/23/15 0406 09/25/15 0431  CALCIUM 8.1* 8.2* 8.5*    CBC  Recent Labs Lab 09/21/15 0500 09/22/15 0700 09/25/15 0431  WBC 16.5* 13.7* 18.2*  HGB 8.6* 8.5* 9.3*  HCT 30.0* 29.2* 32.4*  PLT 235 220 216    Coag's No results for input(s): APTT, INR in the last 168 hours.  Sepsis Markers No results for input(s): LATICACIDVEN, PROCALCITON, O2SATVEN in the last 168 hours.  ABG  Recent Labs Lab 09/25/15 0850  PHART 7.384  PCO2ART 57.9*  PO2ART 102.0*    Liver Enzymes  Recent Labs Lab 09/19/15 0824  AST 26  ALT 28  ALKPHOS 76  BILITOT 0.8  ALBUMIN 2.7*    Cardiac Enzymes No results for input(s): TROPONINI, PROBNP in the last 168 hours.  Glucose  Recent Labs Lab 09/19/15 1143 09/19/15 1558 09/19/15 2119 09/20/15 0704 09/23/15 0554  GLUCAP 152* 126* 132* 99 105*    Imaging Dg Chest Port 1 View  09/25/2015  CLINICAL DATA:  70 year old female with shortness of breath. EXAM: PORTABLE CHEST 1 VIEW COMPARISON:  09/22/2015 and prior exams FINDINGS: Cardiomegaly and bilateral interstitial opacities are unchanged. Mild bibasilar atelectasis again noted. There is no evidence of pneumothorax. No other interval changes identified. IMPRESSION: Unchanged appearance of the chest with cardiomegaly, probable interstitial pulmonary edema and basilar atelectasis. Electronically Signed   By: Margarette Canada  M.D.   On: 09/25/2015 09:00     STUDIES:  12/22  ECHO >> LVEF 55-60%, normal wall motion, mild MR / TR, RV mildly dilated, RA mildly dilated, PA peak pressure 66  CULTURES: BCx2 12/19 >> neg UC 12/19 >> neg  UA 1/2 >>   ANTIBIOTICS:   SIGNIFICANT EVENTS: 12/19  Admit with SOB, cough, fall with RLE hematoma. Found to have UTI.   12/26  To OR for I&D of RLE 12/31  To OR for skin graft 01/02  Respiratory decompensation, tx to ICU for BiPAP, PCCM consulted   LINES/TUBES:   DISCUSSION: 70 y/o F with PMH of O2 dependent COPD, CAD, AF on xarelto admitted 12/19 SOB, cough, post fall with RLE hematoma and UTI.  Intermittent respiratory distress (thought to be volume overload) resolved with diuresis.  Repeat distress on 1/2, PCCM consulted.  Pt transferred to ICU.    ASSESSMENT / PLAN:  PULMONARY A: Acute on Chronic Hypoxic / Hypercarbic Respiratory Failure - interstitial edema, component of abd distention with constipation / ? Ileus and baseline COPD.  Concern for PE with patient being off chronic anticoagulation + surgery / immobility.   O2 Dependent COPD  PAH  P:   Now BiPAP for respiratory distress.  Allow for break this afternoon.  May require intubation  Oxygen to support saturations 90-94% Trend CXR Resume anticoagulation - heparin gtt for now in the event of acute bleeding with recent LE surgery.  Ok from Ortho standpoint (discussed with Dr. Ninfa Linden) No CTA as chronically anticoagulated and concern for AKI Continue brovana + pulmicort PRN xopenex  May need to increase steroids but hopeful to avoid with agitation / anxiety  Repeat ABG @ 1400   CARDIOVASCULAR A:  Atrial Fibrillation / Flutter - on chronic anticoagulation / Xarelto  Hx CAD, NSTEMI (remote) P:  ICU monitoring of hemodynamics  Continue clonidine, diltiazem   RENAL A:   Acute on Chronic Kidney Disease  P:   Trend BMP / UOP  Lasix 80 mg IV x1 on 1/2    GASTROINTESTINAL A:   ? Small Ileus -  noted on KUB 09/25/15, received enema on 12/31 with BM+ Constipation - contributing to dyspnea  P:   Add miralax QD, senokot-s Monitor abdomen.  No nausea / vomiting.   Mobilize as able   HEMATOLOGIC A:   Anemia  Hx AF on Xarelto  P:  Trend CBC Restart heparin gtt per pharmacy.  Concern for PE with acute onset SOB/hypoxemia, chronically anticoatulated  INFECTIOUS A:   Fever - tmax 101.5 1/2, CXR without acute  infiltrate  UTI - treated per TRH P:   Monitor fever curve / WBC Hold abx for now - no clear source, isolated fever x1 with period of agitation  Repeat UA   ENDOCRINE A:   No hx DM P:   Monitor glucose on BMP At risk hypoglycemia  NEUROLOGIC A:   Agitation / Anxiety  P:   RASS goal: 0 PRN ativan for anxiety  Monitor neuro status closely    FAMILY  - Updates: No family at bedside.    - Inter-disciplinary family meet or Palliative Care meeting due by:  10/02/15   Noe Gens, NP-C [facility] Pgr: [phone] or if no answer [phone] 09/25/2015, 9:39 AM

## 2015-09-25 NOTE — Significant Event (Signed)
Rapid Response Event Note  Overview: Time Called: 0752 Arrival Time: 0755 Event Type: Respiratory  Initial Focused Assessment: Dr Cruzita Lederer at bedside for morning assessment and discovered patient in distress. Patient in acute respiratory distress, Increased WOB using accessory muscles and restless. BP 170/80  ST 109  RR 36  O2 sat 96 on NRB Temp 101.5 rectal Minimal air movement, only upper airways.   She states that she feels like she is smothering  Interventions: Placed on 100% NRB 80mg  Lasix given IV Respiratory treatments given x2 No real improvement in patient status. Placed on BIpap Foley placed  PCXR done After 30 min on bipap patient still with increased WOB, and restless,  CCM consulted by Dr Acquanetta Chain NP at bedside to assess patient. Low dose morphine and ativan given, patient slightly improved, less restless. Patient transported to 2s11 via bed with bipap and heart monitor. RN at bedside to accept patient.  Event Summary: Name of Physician Notified: Dr Cruzita Lederer at bedside at    Name of Consulting Physician Notified: CCM, Theadora Rama NP at bedside at    Outcome: Transferred (Comment) (2S11)     Raliegh Ip

## 2015-09-25 NOTE — Plan of Care (Signed)
Alfredo Martinez NP paged

## 2015-09-25 NOTE — Progress Notes (Signed)
[facility] Progress Note Patient Name: Caitlin Hampton DOB: 04-29-1946 MRN: [id number]   Date of Service  09/25/2015  HPI/Events of Note  Portable CXR reviewed. ETT at the level of the carina.  eICU Interventions  Pulling back 1cm. Awaiting ABG.     Intervention Category Intermediate Interventions: Diagnostic test evaluation  Tera Partridge 09/25/2015, 5:55 PM

## 2015-09-26 ENCOUNTER — Inpatient Hospital Stay (HOSPITAL_COMMUNITY): Payer: Medicare Other

## 2015-09-26 ENCOUNTER — Ambulatory Visit (HOSPITAL_COMMUNITY): Payer: Medicare Other

## 2015-09-26 ENCOUNTER — Encounter (HOSPITAL_COMMUNITY): Payer: Self-pay | Admitting: Orthopaedic Surgery

## 2015-09-26 ENCOUNTER — Ambulatory Visit (HOSPITAL_COMMUNITY): Payer: Medicare Other | Attending: Pulmonary Disease

## 2015-09-26 DIAGNOSIS — I071 Rheumatic tricuspid insufficiency: Secondary | ICD-10-CM | POA: Insufficient documentation

## 2015-09-26 DIAGNOSIS — J9600 Acute respiratory failure, unspecified whether with hypoxia or hypercapnia: Secondary | ICD-10-CM

## 2015-09-26 DIAGNOSIS — R0600 Dyspnea, unspecified: Secondary | ICD-10-CM

## 2015-09-26 DIAGNOSIS — I517 Cardiomegaly: Secondary | ICD-10-CM | POA: Insufficient documentation

## 2015-09-26 DIAGNOSIS — J9621 Acute and chronic respiratory failure with hypoxia: Secondary | ICD-10-CM

## 2015-09-26 LAB — PROCALCITONIN: PROCALCITONIN: 0.63 ng/mL

## 2015-09-26 LAB — BASIC METABOLIC PANEL
ANION GAP: 11 (ref 5–15)
BUN: 27 mg/dL — ABNORMAL HIGH (ref 6–20)
CO2: 36 mmol/L — ABNORMAL HIGH (ref 22–32)
Calcium: 8.4 mg/dL — ABNORMAL LOW (ref 8.9–10.3)
Chloride: 92 mmol/L — ABNORMAL LOW (ref 101–111)
Creatinine, Ser: 1.09 mg/dL — ABNORMAL HIGH (ref 0.44–1.00)
GFR calc Af Amer: 59 mL/min — ABNORMAL LOW (ref >60)
GFR, EST NON AFRICAN AMERICAN: 51 mL/min — AB (ref >60)
GLUCOSE: 121 mg/dL — AB (ref 65–99)
POTASSIUM: 3.7 mmol/L (ref 3.5–5.1)
Sodium: 139 mmol/L (ref 135–145)

## 2015-09-26 LAB — GLUCOSE, CAPILLARY
GLUCOSE-CAPILLARY: 106 mg/dL — AB (ref 65–99)
GLUCOSE-CAPILLARY: 115 mg/dL — AB (ref 65–99)
Glucose-Capillary: 115 mg/dL — ABNORMAL HIGH (ref 65–99)
Glucose-Capillary: 125 mg/dL — ABNORMAL HIGH (ref 65–99)

## 2015-09-26 LAB — CBC
HCT: 30.7 % — ABNORMAL LOW (ref 36.0–46.0)
Hemoglobin: 9.1 g/dL — ABNORMAL LOW (ref 12.0–15.0)
MCH: 26.1 pg (ref 26.0–34.0)
MCHC: 29.6 g/dL — ABNORMAL LOW (ref 30.0–36.0)
MCV: 88 fL (ref 78.0–100.0)
PLATELETS: 180 10*3/uL (ref 150–400)
RBC: 3.49 MIL/uL — AB (ref 3.87–5.11)
RDW: 21.4 % — ABNORMAL HIGH (ref 11.5–15.5)
WBC: 17.8 10*3/uL — AB (ref 4.0–10.5)

## 2015-09-26 LAB — POCT I-STAT 3, ART BLOOD GAS (G3+)
Acid-Base Excess: 13 mmol/L — ABNORMAL HIGH (ref 0.0–2.0)
Bicarbonate: 38.8 mEq/L — ABNORMAL HIGH (ref 20.0–24.0)
O2 Saturation: 87 %
PH ART: 7.442 (ref 7.350–7.450)
TCO2: 41 mmol/L (ref 0–100)
pCO2 arterial: 56.8 mmHg — ABNORMAL HIGH (ref 35.0–45.0)
pO2, Arterial: 52 mmHg — ABNORMAL LOW (ref 80.0–100.0)

## 2015-09-26 LAB — TROPONIN I: TROPONIN I: 0.09 ng/mL — AB (ref <0.031)

## 2015-09-26 LAB — HEPARIN LEVEL (UNFRACTIONATED): HEPARIN UNFRACTIONATED: 0.49 [IU]/mL (ref 0.30–0.70)

## 2015-09-26 MED ORDER — VITAL HIGH PROTEIN PO LIQD
1000.0000 mL | ORAL | Status: DC
  Administered 2015-09-26 (×3)
  Filled 2015-09-26 (×2): qty 1000

## 2015-09-26 MED ORDER — INSULIN ASPART 100 UNIT/ML ~~LOC~~ SOLN
0.0000 [IU] | SUBCUTANEOUS | Status: DC
  Administered 2015-09-26 – 2015-09-27 (×4): 2 [IU] via SUBCUTANEOUS

## 2015-09-26 MED ORDER — VITAL HIGH PROTEIN PO LIQD
1000.0000 mL | ORAL | Status: DC
  Filled 2015-09-26: qty 1000

## 2015-09-26 MED ORDER — CLONIDINE HCL 0.1 MG PO TABS
0.1000 mg | ORAL_TABLET | Freq: Two times a day (BID) | ORAL | Status: DC
  Administered 2015-09-26 (×2): 0.1 mg
  Filled 2015-09-26 (×2): qty 1

## 2015-09-26 MED ORDER — FUROSEMIDE 10 MG/ML IJ SOLN
40.0000 mg | Freq: Once | INTRAMUSCULAR | Status: AC
  Administered 2015-09-26: 40 mg via INTRAVENOUS
  Filled 2015-09-26: qty 4

## 2015-09-26 MED ORDER — ATORVASTATIN CALCIUM 10 MG PO TABS
10.0000 mg | ORAL_TABLET | Freq: Every day | ORAL | Status: DC
  Administered 2015-09-26: 10 mg
  Filled 2015-09-26: qty 1

## 2015-09-26 MED ORDER — VITAL HIGH PROTEIN PO LIQD
1000.0000 mL | ORAL | Status: DC
  Administered 2015-09-26: 11:00:00
  Filled 2015-09-26 (×3): qty 1000

## 2015-09-26 MED ORDER — ENOXAPARIN SODIUM 100 MG/ML ~~LOC~~ SOLN
100.0000 mg | Freq: Two times a day (BID) | SUBCUTANEOUS | Status: DC
  Administered 2015-09-26 – 2015-09-29 (×8): 100 mg via SUBCUTANEOUS
  Filled 2015-09-26 (×9): qty 1

## 2015-09-26 MED ORDER — ACETAMINOPHEN 160 MG/5ML PO SOLN
650.0000 mg | Freq: Four times a day (QID) | ORAL | Status: DC | PRN
  Administered 2015-09-29 – 2015-10-01 (×3): 650 mg via ORAL
  Filled 2015-09-26 (×3): qty 20.3

## 2015-09-26 MED ORDER — PANTOPRAZOLE SODIUM 40 MG IV SOLR
40.0000 mg | INTRAVENOUS | Status: DC
  Administered 2015-09-27: 40 mg via INTRAVENOUS
  Filled 2015-09-26: qty 40

## 2015-09-26 MED ORDER — DILTIAZEM 12 MG/ML ORAL SUSPENSION
60.0000 mg | Freq: Four times a day (QID) | ORAL | Status: DC
Start: 2015-09-26 — End: 2015-09-27
  Administered 2015-09-26 – 2015-09-27 (×4): 60 mg
  Filled 2015-09-26 (×10): qty 6

## 2015-09-26 MED ORDER — LEVOTHYROXINE SODIUM 100 MCG PO TABS
100.0000 ug | ORAL_TABLET | Freq: Every day | ORAL | Status: DC
Start: 2015-09-27 — End: 2015-09-27
  Administered 2015-09-27: 100 ug
  Filled 2015-09-26: qty 1

## 2015-09-26 NOTE — Progress Notes (Addendum)
PULMONARY / CRITICAL CARE MEDICINE   Name: Caitlin Hampton MRN: [id number] DOB: Mar 07, 1946    ADMISSION DATE:  09/11/2015 CONSULTATION DATE:  09/25/2015  REFERRING MD:  [facility]  CHIEF COMPLAINT:  Dyspnea  SUBJECTIVE:  Denies chest/abd pain  VITAL SIGNS: BP 142/82 mmHg  Pulse 78  Temp(Src) 97.9 F (36.6 C) (Axillary)  Resp 21  Ht 5\' 4"  (1.626 m)  Wt 217 lb 6 oz (98.6 kg)  BMI 37.29 kg/m2  SpO2 100%  VENTILATOR SETTINGS: Vent Mode:  [-] PRVC FiO2 (%):  [40 %-100 %] 40 % Set Rate:  [10 bmp-14 bmp] 14 bmp Vt Set:  [470 mL] 470 mL PEEP:  [5 cmH20-8 cmH20] 8 cmH20 Plateau Pressure:  [15 cmH20-18 cmH20] 18 cmH20  INTAKE / OUTPUT: I/O last 3 completed shifts: In: 917.2 [P.O.:240; I.V.:587.2; NG/GT:90] Out: 1900 [Urine:1900]  PHYSICAL EXAMINATION: General:  sedated Neuro:  RASS -1, follows commands HEENT:  Pupils reactive Cardiovascular:  Irregular, no murmur Lungs:  Scattered rhonchi, no wheeze Abdomen:  Soft, non tender Musculoskeletal:  Rt leg wound dressing clean Skin:  No rashes  LABS:  BMET  Recent Labs Lab 09/23/15 0406 09/25/15 0431 09/26/15 0310  NA 136 135 139  K 4.2 4.5 3.7  CL 93* 90* 92*  CO2 33* 35* 36*  BUN 24* 30* 27*  CREATININE 1.23* 1.44* 1.09*  GLUCOSE 85 113* 121*    Electrolytes  Recent Labs Lab 09/23/15 0406 09/25/15 0431 09/26/15 0310  CALCIUM 8.2* 8.5* 8.4*    CBC  Recent Labs Lab 09/22/15 0700 09/25/15 0431 09/26/15 0540  WBC 13.7* 18.2* 17.8*  HGB 8.5* 9.3* 9.1*  HCT 29.2* 32.4* 30.7*  PLT 220 216 PENDING    Coag's  Recent Labs Lab 09/25/15 2130  APTT 31  INR 1.13    Sepsis Markers  Recent Labs Lab 09/25/15 1700 09/26/15 0310  LATICACIDVEN 1.1  --   PROCALCITON 0.60 0.63    ABG  Recent Labs Lab 09/25/15 1742 09/25/15 1759 09/26/15 0330  PHART 7.446 7.449 7.442  PCO2ART 56.6* 56.8* 56.8*  PO2ART 32.0* 57.0* 52.0*    Liver Enzymes  Recent Labs Lab 09/19/15 0824  AST 26  ALT 28   ALKPHOS 76  BILITOT 0.8  ALBUMIN 2.7*    Cardiac Enzymes  Recent Labs Lab 09/25/15 1700 09/25/15 2130 09/26/15 0310  TROPONINI 0.11* 0.11* 0.09*    Glucose  Recent Labs Lab 09/19/15 1143 09/19/15 1558 09/19/15 2119 09/20/15 0704 09/23/15 0554  GLUCAP 152* 126* 132* 99 105*    Imaging Dg Chest Port 1 View  09/25/2015  CLINICAL DATA:  Respiratory failure. EXAM: PORTABLE CHEST 1 VIEW COMPARISON:  Earlier today at 1701 hours FINDINGS: Endotracheal tube terminates approximately 15 mm above the carina. Nasogastric tube is poorly visualized distally but likely terminates at the distal stomach. Numerous leads and wires project over the chest. Cardiomegaly accentuated by AP portable technique. No pleural effusion or pneumothorax. Low lung volumes with resultant pulmonary interstitial prominence. No lobar consolidation. Mild pulmonary venous congestion remains. IMPRESSION: Cardiomegaly and low lung volumes with persistent pulmonary venous congestion. Endotracheal tube terminating 1.5 cm above carina. Consider retraction 2-3 cm. Electronically Signed   By: Abigail Miyamoto M.D.   On: 09/25/2015 18:41   Dg Chest Port 1 View  09/25/2015  CLINICAL DATA:  Acute respiratory failure. Endotracheal tube placement. EXAM: PORTABLE CHEST 1 VIEW COMPARISON:  09/25/2015 FINDINGS: Endotracheal tube has been placed, tip overlying the level of the lower trachea, 1.6 cm above the carina. Consider  withdrawing endotracheal tube 1-2 cm. The heart is enlarged. The left hemidiaphragm is elevated and stable. There is mild right lower lobe atelectasis. There has been some improvement of interstitial edema. Nasogastric tube is in place, tip in the stomach. IMPRESSION: 1. Interval placement of nasogastric tube ; placement of endotracheal tube with tip 1.6 cm above carina. 2. Mild improvement in pulmonary edema. Electronically Signed   By: Nolon Nations M.D.   On: 09/25/2015 17:47   Dg Chest Port 1 View  09/25/2015   CLINICAL DATA:  70 year old female with shortness of breath. EXAM: PORTABLE CHEST 1 VIEW COMPARISON:  09/22/2015 and prior exams FINDINGS: Cardiomegaly and bilateral interstitial opacities are unchanged. Mild bibasilar atelectasis again noted. There is no evidence of pneumothorax. No other interval changes identified. IMPRESSION: Unchanged appearance of the chest with cardiomegaly, probable interstitial pulmonary edema and basilar atelectasis. Electronically Signed   By: Margarette Canada M.D.   On: 09/25/2015 09:00   Dg Abd Portable 1v  09/25/2015  CLINICAL DATA:  Abdominal distention. EXAM: PORTABLE ABDOMEN - 1 VIEW COMPARISON:  None. FINDINGS: The stomach shows gaseous distention. There is a fairly large amount of fecal material throughout the colon without evidence of significant focal impaction or bowel obstruction. Some gas in nondilated small bowel loops may also be consistent with a mild component of small bowel ileus. No abnormal calcifications or soft tissue abnormalities identified. IMPRESSION: Gaseous distention of stomach. Diffuse fecal material throughout the colon. Gas in nondilated small bowel loops may be reflective of a mild component of small bowel ileus. Electronically Signed   By: Aletta Edouard M.D.   On: 09/25/2015 11:28     STUDIES:  12/22 Echo >> EF 55 to 60%, PAS 66 mmHg 01/03 Doppler upper extremity >> 01/03 Echo >>  CULTURES: 12/19 Blood >> negative 12/29 Urine >> negative  ANTIBIOTICS:  SIGNIFICANT EVENTS: 12/19 Admit 12/26 To OR for I&D Rt leg 12/31 Skin graft Rt leg 01/02 To ICU  LINES/TUBES: 1/02 ETT >>  DISCUSSION: 70 yo female former smoker was admitted with progressive dyspnea, cough, and fall with Rt leg contusion and UTI.  She developed progressive respiratory distress and transferred to ICU on 09/25/15. She has hx of COPD on 3 liters oxygen, pulmonary hypertension, CAD, HTN, diastolic CHF, A fib on xarelto.  ASSESSMENT / PLAN:  PULMONARY A: Acute on  chronic hypoxic/hypercapnic respiratory failure. AECOPD. Pulmonary HTN. P:   Full vent support F/u CXR Brovana, pulmicort Continue solumedrol 40 mg q12h  CARDIOVASCULAR A:  Acute on chronic diastolic CHF. A fib on xarelto as outpt. Hx of CAD, HLD, HTN. P:  Will change heparin gtt to lovenox due to limited IV access Continue lipitor, catapres, cardizem F/u Echo, doppler extremities  RENAL A:   AKI. CKD stage II >> baseline creatinine is 0.94 from 09/11/15. P:   Monitor renal fx urine outpt  GASTROINTESTINAL A:   Constipation. Ileus >> improved 1/03. Nutrition. P:   Protonix for SUP Tube feeds while on vent Bowel regimen  HEMATOLOGIC A:   Anemia of critical illness and chronic disease. P:  F/u CBC  INFECTIOUS A:   Fever (Tm 101.5) on 09/25/15 >> none since. UTI >> completed Abx 09/15/15. P:   Monitor off Abx for now  ENDOCRINE A:   Steroid induced hyperglycemia. Hx of hypothyroidism. P:   SSI while on tube feeds Synthroid  NEUROLOGIC A:   Acute encephalopathy 2nd to respiratory failure. P:   RASS goal: -1  ORTHOPEDICS A: Rt leg wound  s/p I&D and skin graft. P: Post op care per orthopedics   CC time 42 minutes.  Chesley Mires, MD [facility] 09/26/2015, 8:03 AM Pager:  [phone] After 3pm call: [phone]

## 2015-09-26 NOTE — Progress Notes (Signed)
Patient ID: Caitlin Hampton, female   DOB: September 22, 1946, 70 y.o.   MRN: [id number] Intubated.  I changed her right leg dressing only over the skin graft.  The skin graft itself looks good with a good take.  A new Xeroform was placed.  Will be changed by MD in 2 -3 days.

## 2015-09-26 NOTE — Progress Notes (Signed)
ANTICOAGULATION CONSULT NOTE - Follow Up Consult  Pharmacy Consult for Heparin Indication: pulmonary embolus  No Known Allergies  Patient Measurements: Height: 5\' 4"  (162.6 cm) Weight: 217 lb 6 oz (98.6 kg) IBW/kg (Calculated) : 54.7 Heparin Dosing Weight: 79  Vital Signs: Temp: 97.9 F (36.6 C) (01/03 0413) Temp Source: Axillary (01/03 0413) BP: 131/69 mmHg (01/03 0400) Pulse Rate: 75 (01/03 0400)  Labs:  Recent Labs  09/25/15 0431 09/25/15 1700 09/25/15 2130 09/26/15 0310 09/26/15 0540  HGB 9.3*  --   --   --  9.1*  HCT 32.4*  --   --   --  30.7*  PLT 216  --   --   --  PENDING  APTT  --   --  31  --   --   LABPROT  --   --  14.7  --   --   INR  --   --  1.13  --   --   HEPARINUNFRC  --   --   --   --  0.49  CREATININE 1.44*  --   --  1.09*  --   TROPONINI  --  0.11* 0.11* 0.09*  --     Estimated Creatinine Clearance: 55.6 mL/min (by C-G formula based on Cr of 1.09).   Medical History: Past Medical History  Diagnosis Date  . Hypertension   . Thyroid disease   . COPD (chronic obstructive pulmonary disease) ([place])   . Anxiety   . H/O hiatal hernia   . Hypothyroidism   . Shortness of breath   . Atrial flutter ([place]) 09/07/2012    , RVR/ hospital 12/13- rate control and Echo normal- on xarelto, when spontaneously converted she had bradycardia on Metoprolol and Digoxin  . GERD (gastroesophageal reflux disease)   . Rhinitis   . Parotitis     , on CT of face- 12/13  . CHF (congestive heart failure) ([place])   . Hx of echocardiogram     Echo (8/15):  EF 60-65%  . NSTEMI (non-ST elevated myocardial infarction) ([place])   . CAP (community acquired pneumonia) 09/14/2015    Medications:  Scheduled:  . antiseptic oral rinse  7 mL Mouth Rinse BID  . antiseptic oral rinse  7 mL Mouth Rinse QID  . arformoterol  15 mcg Nebulization BID  . atorvastatin  10 mg Oral QHS  . budesonide  0.5 mg Nebulization BID  . chlorhexidine gluconate  15 mL Mouth/Throat BID  .  cloNIDine  0.1 mg Oral BID  . diltiazem  120 mg Oral BID  . levothyroxine  100 mcg Oral QAC breakfast  . methylPREDNISolone (SOLU-MEDROL) injection  40 mg Intravenous Q12H  . polyethylene glycol  17 g Oral Daily  . senna-docusate  1 tablet Oral Daily  . sodium chloride  3 mL Intravenous Q12H  . sorbitol, milk of mag, mineral oil, glycerin (SMOG) enema  960 mL Rectal Once    Assessment: 70yo female admitted 12/20 with COPD & SOB; she had been continued on Xarelto 20mg  daily and developed a RLE hematoma, after a fall, which required drainage and packing.  Xarelto was held, last dose was on 12/22. Yesterday, she was found to be in respiratory distress & was transferred to ICU.  She cannot get a CTA d/t elevated Cr but with ECHO (+)new pulmonary HTN, she is to be treated for R/O PE.  F/u HL is therapeutic at 0.49  Hg 9.1 ~stable/improving since last transfusion on 12/27  Pltc 216 Cr  1.44>>1.04  Goal of Therapy:  Heparin level 0.3-0.7 units/ml Monitor platelets by anticoagulation protocol: Yes   Plan:  Continue heparin infusion at 1150 units/hr F/u 8 hr confirmatory HL  Daily HL, CBC Watch closely for s/s of bleeding  Albertina Parr, PharmD., BCPS Clinical Pharmacist Pager [phone]

## 2015-09-26 NOTE — Progress Notes (Signed)
VASCULAR LAB PRELIMINARY  PRELIMINARY  PRELIMINARY  PRELIMINARY  Bilateral lower extremity venous duplex completed.    Preliminary report:  Technically limited and difficult on the right due to bandaging from a recent skin graft. Unable to evaluate the distal femoral and the peroneal veins. No obvious evidence of DVT, superficial thrombus, or Baker's cyst of the visualized regions. Left:  No evidence of DVT, superficial thrombosis, or Baker's cyst.  Saunders [place], RVS 09/26/2015, 12:24 PM

## 2015-09-26 NOTE — Progress Notes (Addendum)
ANTICOAGULATION CONSULT NOTE - Follow Up Consult  Pharmacy Consult for lovenox Indication: afib, r/o pulmonary embolus  No Known Allergies  Patient Measurements: Height: 5\' 4"  (162.6 cm) Weight: 217 lb 6 oz (98.6 kg) IBW/kg (Calculated) : 54.7 Heparin Dosing Weight: 79  Vital Signs: Temp: 99 F (37.2 C) (01/03 0824) Temp Source: Axillary (01/03 0824) BP: 142/82 mmHg (01/03 0700) Pulse Rate: 78 (01/03 0700)  Labs:  Recent Labs  09/25/15 0431 09/25/15 1700 09/25/15 2130 09/26/15 0310 09/26/15 0540  HGB 9.3*  --   --   --  9.1*  HCT 32.4*  --   --   --  30.7*  PLT 216  --   --   --  180  APTT  --   --  31  --   --   LABPROT  --   --  14.7  --   --   INR  --   --  1.13  --   --   HEPARINUNFRC  --   --   --   --  0.49  CREATININE 1.44*  --   --  1.09*  --   TROPONINI  --  0.11* 0.11* 0.09*  --     Estimated Creatinine Clearance: 55.6 mL/min (by C-G formula based on Cr of 1.09).  Assessment: 70yo female admitted 12/20 with COPD & SOB; she had been continued on Xarelto 20mg  daily and developed a RLE hematoma, after a fall, which required drainage and packing.  Xarelto was held, last dose was on 12/22. Yesterday, she was found to be in respiratory distress & was transferred to ICU. Transitioning heparin to lovenox d/t limited IV access. CBC is stable and no new bleeding noted.   Goal of Therapy:  Anti-Xa level 0.6-1 units/ml 4hrs after LMWH dose given Monitor platelets by anticoagulation protocol: Yes   Plan:  - Lovenox 100mg  SQ Q12H - start 1 hour after heparin drip turned off - Daily CBC - F/u plans for oral anticoagulation  Salome Arnt, PharmD, BCPS Pager # [phone] 09/26/2015 8:42 AM

## 2015-09-26 NOTE — Progress Notes (Signed)
PT Cancellation Note--SIGNOFF  Patient Details Name: AILINE RUESGA MRN: [id number] DOB: 23-Jan-1946   Cancelled Treatment:    Reason Eval/Treat Not Completed: Medical issues which prohibited therapy;Other (comment) (MD discontinued).  Will sign off and await reorder.  09/26/2015  Donnella Sham, [place] [phone]  (pager)  Selim Durden, Tessie Fass 09/26/2015, 12:28 PM

## 2015-09-26 NOTE — Progress Notes (Signed)
[facility] [place] Progress Note Patient Name: Caitlin Hampton DOB: 03/06/1946 MRN: [id number]   Date of Service  09/26/2015  HPI/Events of Note  Stress ulcer prophylaxis on vent.  eICU Interventions  Protonix     Intervention Category Minor Interventions: Routine modifications to care plan (e.g. PRN medications for pain, fever)  Tera Partridge 09/26/2015, 10:07 PM

## 2015-09-26 NOTE — Progress Notes (Signed)
Initial Nutrition Assessment  DOCUMENTATION CODES:   Obesity unspecified  INTERVENTION:    Increase Vital High Protein formula to goal rate of 55 ml/hr  TF regimen to provide 1320 kcals, 115 gm protein, 1104 ml of free water  NUTRITION DIAGNOSIS:   Inadequate oral intake related to inability to eat as evidenced by NPO status  GOAL:   Provide needs based on ASPEN/SCCM guidelines  MONITOR:   Vent status, TF tolerance, Labs, Weight trends, I & O's  REASON FOR ASSESSMENT:   Consult Enteral/tube feeding initiation and management  ASSESSMENT:   70 yo female former smoker was admitted with progressive dyspnea, cough, and fall with Rt leg contusion and UTI. She developed progressive respiratory distress and transferred to ICU on 09/25/15.  Patient s/p procedure 12/31: SPLIT-THICKNESS SKIN GRAFT FROM RIGHT THIGH DONOR SITE TO RIGHT LEG WOUND  Patient is currently intubated on ventilator support Temp (24hrs), Avg:98.3 F (36.8 C), Min:97.6 F (36.4 C), Max:99.2 F (37.3 C)   Vital High Protein formula initiated via OGT today via Adult Tube Feeding Protocol.  Goal rate at 40 ml/hr.  Nutrition focused physical exam completed.  No muscle or subcutaneous fat depletion noticed.  Diet Order:  Diet NPO time specified  Skin:    R leg wound  Last BM:  12/31  Height:   Ht Readings from Last 1 Encounters:  09/11/15 5\' 4"  (1.626 m)    Weight:   Wt Readings from Last 1 Encounters:  09/26/15 217 lb 6 oz (98.6 kg)    Ideal Body Weight:  54.5 kg  BMI:  Body mass index is 37.29 kg/(m^2).  Estimated Nutritional Needs:   Kcal:  [id number]  Protein:  110-120 gm  Fluid:  per MD  EDUCATION NEEDS:   No education needs identified at this time  Arthur Holms, RD, LDN Pager #: [phone] After-Hours Pager #: [phone]

## 2015-09-26 NOTE — Progress Notes (Signed)
  Echocardiogram 2D Echocardiogram limited has been performed.  Darlina Sicilian M 09/26/2015, 1:11 PM

## 2015-09-27 ENCOUNTER — Inpatient Hospital Stay (HOSPITAL_COMMUNITY): Payer: Medicare Other

## 2015-09-27 LAB — BASIC METABOLIC PANEL
Anion gap: 10 (ref 5–15)
BUN: 36 mg/dL — AB (ref 6–20)
CALCIUM: 8.8 mg/dL — AB (ref 8.9–10.3)
CO2: 37 mmol/L — ABNORMAL HIGH (ref 22–32)
CREATININE: 0.97 mg/dL (ref 0.44–1.00)
Chloride: 94 mmol/L — ABNORMAL LOW (ref 101–111)
GFR calc Af Amer: >60 mL/min (ref >60)
GFR, EST NON AFRICAN AMERICAN: 58 mL/min — AB (ref >60)
GLUCOSE: 130 mg/dL — AB (ref 65–99)
Potassium: 4 mmol/L (ref 3.5–5.1)
Sodium: 141 mmol/L (ref 135–145)

## 2015-09-27 LAB — GLUCOSE, CAPILLARY
GLUCOSE-CAPILLARY: 135 mg/dL — AB (ref 65–99)
GLUCOSE-CAPILLARY: 135 mg/dL — AB (ref 65–99)
GLUCOSE-CAPILLARY: 109 mg/dL — AB (ref 65–99)
Glucose-Capillary: 121 mg/dL — ABNORMAL HIGH (ref 65–99)
Glucose-Capillary: 133 mg/dL — ABNORMAL HIGH (ref 65–99)
Glucose-Capillary: 150 mg/dL — ABNORMAL HIGH (ref 65–99)

## 2015-09-27 LAB — CBC
HCT: 31.3 % — ABNORMAL LOW (ref 36.0–46.0)
Hemoglobin: 9.1 g/dL — ABNORMAL LOW (ref 12.0–15.0)
MCH: 26 pg (ref 26.0–34.0)
MCHC: 29.1 g/dL — AB (ref 30.0–36.0)
MCV: 89.4 fL (ref 78.0–100.0)
PLATELETS: 187 10*3/uL (ref 150–400)
RBC: 3.5 MIL/uL — ABNORMAL LOW (ref 3.87–5.11)
RDW: 21.2 % — AB (ref 11.5–15.5)
WBC: 15.5 10*3/uL — ABNORMAL HIGH (ref 4.0–10.5)

## 2015-09-27 LAB — MAGNESIUM: Magnesium: 2.4 mg/dL (ref 1.7–2.4)

## 2015-09-27 LAB — PROCALCITONIN: Procalcitonin: 0.42 ng/mL

## 2015-09-27 MED ORDER — INSULIN ASPART 100 UNIT/ML ~~LOC~~ SOLN
0.0000 [IU] | Freq: Three times a day (TID) | SUBCUTANEOUS | Status: DC
  Administered 2015-09-27 – 2015-09-28 (×3): 2 [IU] via SUBCUTANEOUS
  Administered 2015-09-28: 3 [IU] via SUBCUTANEOUS
  Administered 2015-09-29 – 2015-10-03 (×6): 2 [IU] via SUBCUTANEOUS

## 2015-09-27 MED ORDER — FENTANYL CITRATE (PF) 100 MCG/2ML IJ SOLN: 25.0000 ug | INTRAMUSCULAR | Status: DC | PRN

## 2015-09-27 MED ORDER — DILTIAZEM 12 MG/ML ORAL SUSPENSION
60.0000 mg | Freq: Four times a day (QID) | ORAL | Status: DC
  Administered 2015-09-27 – 2015-09-28 (×4): 60 mg via ORAL
  Filled 2015-09-27 (×8): qty 6

## 2015-09-27 MED ORDER — CLONIDINE HCL 0.1 MG PO TABS
0.1000 mg | ORAL_TABLET | Freq: Two times a day (BID) | ORAL | Status: DC
  Administered 2015-09-27 – 2015-10-03 (×13): 0.1 mg via ORAL
  Filled 2015-09-27 (×15): qty 1

## 2015-09-27 MED ORDER — LEVOTHYROXINE SODIUM 100 MCG PO TABS
100.0000 ug | ORAL_TABLET | Freq: Every day | ORAL | Status: DC
  Administered 2015-09-28 – 2015-10-03 (×6): 100 ug via ORAL
  Filled 2015-09-27 (×6): qty 1

## 2015-09-27 MED ORDER — ALBUTEROL SULFATE (2.5 MG/3ML) 0.083% IN NEBU
2.5000 mg | INHALATION_SOLUTION | RESPIRATORY_TRACT | Status: DC | PRN
Start: 2015-09-27 — End: 2015-10-03

## 2015-09-27 MED ORDER — TIOTROPIUM BROMIDE MONOHYDRATE 18 MCG IN CAPS
18.0000 ug | ORAL_CAPSULE | Freq: Every day | RESPIRATORY_TRACT | Status: DC
  Administered 2015-09-27 – 2015-10-01 (×5): 18 ug via RESPIRATORY_TRACT
  Filled 2015-09-27 (×2): qty 5

## 2015-09-27 MED ORDER — PANTOPRAZOLE SODIUM 40 MG PO TBEC
40.0000 mg | DELAYED_RELEASE_TABLET | Freq: Every day | ORAL | Status: DC
  Administered 2015-09-27 – 2015-10-03 (×8): 40 mg via ORAL
  Filled 2015-09-27 (×7): qty 1

## 2015-09-27 MED ORDER — INSULIN ASPART 100 UNIT/ML ~~LOC~~ SOLN: 0.0000 [IU] | Freq: Every day | SUBCUTANEOUS | Status: DC

## 2015-09-27 MED ORDER — ATORVASTATIN CALCIUM 10 MG PO TABS
10.0000 mg | ORAL_TABLET | Freq: Every day | ORAL | Status: DC
  Administered 2015-09-27 – 2015-10-02 (×6): 10 mg via ORAL
  Filled 2015-09-27 (×7): qty 1

## 2015-09-27 NOTE — Progress Notes (Signed)
PULMONARY / CRITICAL CARE MEDICINE   Name: Caitlin Hampton MRN: [id number] DOB: [birth year]    ADMISSION DATE:  09/11/2015 CONSULTATION DATE:  09/25/2015  REFERRING MD:  [facility]  CHIEF COMPLAINT:  Dyspnea  SUBJECTIVE:  Tolerating SBT.  VITAL SIGNS: BP 123/69 mmHg  Pulse 61  Temp(Src) 97.5 F (36.4 C) (Oral)  Resp 11  Ht 5\' 4"  (1.626 m)  Wt 213 lb 10 oz (96.9 kg)  BMI 36.65 kg/m2  SpO2 95%  VENTILATOR SETTINGS: Vent Mode:  [-] PSV;CPAP FiO2 (%):  [40 %-50 %] 40 % Set Rate:  [14 bmp] 14 bmp Vt Set:  [470 mL] 470 mL PEEP:  [5 cmH20] 5 cmH20 Pressure Support:  [5 cmH20] 5 cmH20 Plateau Pressure:  [16 cmH20-19 cmH20] 16 cmH20  INTAKE / OUTPUT: I/O last 3 completed shifts: In: 1796.7 [I.V.:951.7; NG/GT:845] Out: 2275 [Urine:2275]  PHYSICAL EXAMINATION: General: alert Neuro:  RASS 0, follows commands HEENT:  Pupils reactive Cardiovascular:  Irregular, no murmur Lungs:  Scattered rhonchi, no wheeze Abdomen:  Soft, non tender Musculoskeletal:  Rt leg wound dressing clean Skin:  No rashes  LABS:  BMET  Recent Labs Lab 09/25/15 0431 09/26/15 0310 09/27/15 0414  NA 135 139 141  K 4.5 3.7 4.0  CL 90* 92* 94*  CO2 35* 36* 37*  BUN 30* 27* 36*  CREATININE 1.44* 1.09* 0.97  GLUCOSE 113* 121* 130*    Electrolytes  Recent Labs Lab 09/25/15 0431 09/26/15 0310 09/27/15 0414  CALCIUM 8.5* 8.4* 8.8*  MG  --   --  2.4    CBC  Recent Labs Lab 09/25/15 0431 09/26/15 0540 09/27/15 0414  WBC 18.2* 17.8* 15.5*  HGB 9.3* 9.1* 9.1*  HCT 32.4* 30.7* 31.3*  PLT 216 180 187    Coag's  Recent Labs Lab 09/25/15 2130  APTT 31  INR 1.13    Sepsis Markers  Recent Labs Lab 09/25/15 1700 09/26/15 0310 09/27/15 0414  LATICACIDVEN 1.1  --   --   PROCALCITON 0.60 0.63 0.42    ABG  Recent Labs Lab 09/25/15 1742 09/25/15 1759 09/26/15 0330  PHART 7.446 7.449 7.442  PCO2ART 56.6* 56.8* 56.8*  PO2ART 32.0* 57.0* 52.0*    Liver Enzymes No results  for input(s): AST, ALT, ALKPHOS, BILITOT, ALBUMIN in the last 168 hours.  Cardiac Enzymes  Recent Labs Lab 09/25/15 1700 09/25/15 2130 09/26/15 0310  TROPONINI 0.11* 0.11* 0.09*    Glucose  Recent Labs Lab 09/26/15 0819 09/26/15 1249 09/26/15 1605 09/26/15 1925 09/26/15 2354 09/27/15 0356  GLUCAP 115* 125* 106* 115* 133* 121*    Imaging Dg Chest Port 1 View  09/27/2015  CLINICAL DATA:  Follow-up respiratory failure. EXAM: PORTABLE CHEST 1 VIEW COMPARISON:  09/26/2015 FINDINGS: Endotracheal tube unchanged with tip 2.5 cm above the carina. Nasogastric tube courses through the stomach as tip is not definitely visualized. Lungs are adequately inflated with mild stable prominence of the perihilar markings. Minimal patchy opacification over the lateral right upper lobe and right base without significant change as this may be due to ongoing infection. No evidence of effusion. Stable cardiomegaly. Remainder of the exam is unchanged. IMPRESSION: Subtle patchy density over the lateral right upper lobe and right base which may be due to infection. Stable cardiomegaly with suggestion of mild vascular congestion. Electronically Signed   By: Marin Olp M.D.   On: 09/27/2015 08:06     STUDIES:  12/22 Echo >> EF 55 to 60%, PAS 66 mmHg 01/03 Doppler lower extremity >> negative  01/03 Echo >> EF 65 to 70%, severe TR, PAS 80 mmHg  CULTURES: 12/19 Blood >> negative 12/29 Urine >> negative  ANTIBIOTICS:  SIGNIFICANT EVENTS: 12/19 Admit 12/26 To OR for I&D Rt leg 12/31 Skin graft Rt leg 01/02 To ICU  LINES/TUBES: 1/02 ETT >> 1/04  DISCUSSION: 70 yo female former smoker was admitted with progressive dyspnea, cough, and fall with Rt leg contusion and UTI.  She developed progressive respiratory distress and transferred to ICU on 09/25/15.  She has hx of COPD on 3 liters oxygen, pulmonary hypertension, CAD, HTN, diastolic CHF, A fib on xarelto.  ASSESSMENT /  PLAN:  PULMONARY A: Acute on chronic hypoxic/hypercapnic respiratory failure. AECOPD. Pulmonary HTN. ?sleep disordered breathing. P:   Proceed with extubation 1/04 Oxygen to keep SpO2 90 to 95% BiPAP qhs and prn after extubation F/u CXR Brovana, pulmicort, spiriva Prn albuterol Continue solumedrol 40 mg q12h  CARDIOVASCULAR A:  Acute on chronic diastolic CHF. A fib on xarelto as outpt. Hx of CAD, HLD, HTN. P:  Lovenox treatment dose for A fib  Continue lipitor, catapres, cardizem  RENAL A:   AKI >> resolved. CKD stage II >> baseline creatinine is 0.94 from 09/11/15. Mild hematuria noted 1/04. P:   Monitor renal fx urine outpt  GASTROINTESTINAL A:   Constipation. Ileus >> improved 1/03. Nutrition. P:   Advance diet after extubation Protonix for SUP Bowel regimen  HEMATOLOGIC A:   Anemia of critical illness and chronic disease. P:  F/u CBC  INFECTIOUS A:   Fever (Tm 101.5) on 09/25/15 >> none since. UTI >> completed Abx 09/15/15. P:   Monitor off Abx for now  ENDOCRINE A:   Steroid induced hyperglycemia. Hx of hypothyroidism. P:   SSI Synthroid  NEUROLOGIC A:   Acute encephalopathy 2nd to respiratory failure >> resolved. P:   Monitor mental status  ORTHOPEDICS A: Rt leg wound s/p I&D and skin graft. P: Post op care per orthopedics  Updated pt's sister at bedside.  CC time 33 minutes.  Chesley Mires, MD [facility] Pulmonary/Critical Care 09/27/2015, 9:18 AM Pager:  [phone] After 3pm call: [phone]

## 2015-09-27 NOTE — Procedures (Signed)
Extubation Procedure Note  Patient Details:   Name: Caitlin Hampton DOB: March 09, 1946 MRN: [id number]   Airway Documentation:     Evaluation  O2 sats: stable throughout Complications: No apparent complications Patient did tolerate procedure well. Bilateral Breath Sounds: Clear, Diminished Suctioning: Oral, Airway Yes   Pt. Was extubated to a 6L [place] without any complications, dyspnea or stridor noted. Pt. Was instructed on IS x 5, highest goal achieved was 57mL.   Brondon Wann L 09/27/2015, 10:10 AM

## 2015-09-28 LAB — CBC
HEMATOCRIT: 31.6 % — AB (ref 36.0–46.0)
HEMOGLOBIN: 9 g/dL — AB (ref 12.0–15.0)
MCH: 25.2 pg — ABNORMAL LOW (ref 26.0–34.0)
MCHC: 28.5 g/dL — ABNORMAL LOW (ref 30.0–36.0)
MCV: 88.5 fL (ref 78.0–100.0)
Platelets: 213 10*3/uL (ref 150–400)
RBC: 3.57 MIL/uL — ABNORMAL LOW (ref 3.87–5.11)
RDW: 21 % — ABNORMAL HIGH (ref 11.5–15.5)
WBC: 17 10*3/uL — AB (ref 4.0–10.5)

## 2015-09-28 LAB — GLUCOSE, CAPILLARY
GLUCOSE-CAPILLARY: 155 mg/dL — AB (ref 65–99)
GLUCOSE-CAPILLARY: 115 mg/dL — AB (ref 65–99)
Glucose-Capillary: 123 mg/dL — ABNORMAL HIGH (ref 65–99)
Glucose-Capillary: 141 mg/dL — ABNORMAL HIGH (ref 65–99)

## 2015-09-28 LAB — BASIC METABOLIC PANEL
ANION GAP: 11 (ref 5–15)
BUN: 44 mg/dL — ABNORMAL HIGH (ref 6–20)
CHLORIDE: 94 mmol/L — AB (ref 101–111)
CO2: 35 mmol/L — AB (ref 22–32)
Calcium: 8.9 mg/dL (ref 8.9–10.3)
Creatinine, Ser: 1.1 mg/dL — ABNORMAL HIGH (ref 0.44–1.00)
GFR calc non Af Amer: 50 mL/min — ABNORMAL LOW (ref >60)
GFR, EST AFRICAN AMERICAN: 58 mL/min — AB (ref >60)
Glucose, Bld: 120 mg/dL — ABNORMAL HIGH (ref 65–99)
Potassium: 3.7 mmol/L (ref 3.5–5.1)
Sodium: 140 mmol/L (ref 135–145)

## 2015-09-28 MED ORDER — DILTIAZEM HCL ER COATED BEADS 180 MG PO CP24
180.0000 mg | ORAL_CAPSULE | Freq: Every day | ORAL | Status: DC
  Administered 2015-09-28 – 2015-10-03 (×6): 180 mg via ORAL
  Filled 2015-09-28 (×7): qty 1

## 2015-09-28 MED ORDER — PREDNISONE 20 MG PO TABS
30.0000 mg | ORAL_TABLET | Freq: Every day | ORAL | Status: DC
  Administered 2015-09-28 – 2015-09-29 (×2): 30 mg via ORAL
  Filled 2015-09-28: qty 1
  Filled 2015-09-28: qty 3

## 2015-09-28 MED ORDER — PRO-STAT SUGAR FREE PO LIQD
30.0000 mL | Freq: Two times a day (BID) | ORAL | Status: DC
Start: 2015-09-28 — End: 2015-10-03
  Administered 2015-09-28 – 2015-10-03 (×10): 30 mL via ORAL
  Filled 2015-09-28 (×10): qty 30

## 2015-09-28 MED ORDER — SODIUM CHLORIDE 0.9 % IV SOLN: INTRAVENOUS | Status: DC | PRN

## 2015-09-28 NOTE — Progress Notes (Signed)
PULMONARY / CRITICAL CARE MEDICINE   Name: Caitlin Hampton MRN: [id number] DOB: [birth year]    ADMISSION DATE:  09/11/2015 CONSULTATION DATE:  09/25/2015  REFERRING MD:  [facility]  CHIEF COMPLAINT:  Dyspnea  SUBJECTIVE:  Breathing better.  Denies chest pain.  Tolerating diet.  VITAL SIGNS: BP 130/93 mmHg  Pulse 76  Temp(Src) 97.3 F (36.3 C) (Oral)  Resp 17  Ht 5\' 4"  (1.626 m)  Wt 214 lb 4.6 oz (97.2 kg)  BMI 36.76 kg/m2  SpO2 94%  INTAKE / OUTPUT: I/O last 3 completed shifts: In: 2086 [P.O.:120; I.V.:1146; NG/GT:820] Out: 1400 [Urine:1400]  PHYSICAL EXAMINATION: General: pleasant Neuro: alert, follows commands HEENT:  Pupils reactive Cardiovascular:  Irregular, no murmur Lungs:  Scattered rhonchi Abdomen:  Soft, non tender Musculoskeletal:  Rt leg wound dressing clean Skin:  No rashes  LABS:  BMET  Recent Labs Lab 09/26/15 0310 09/27/15 0414 09/28/15 0223  NA 139 141 140  K 3.7 4.0 3.7  CL 92* 94* 94*  CO2 36* 37* 35*  BUN 27* 36* 44*  CREATININE 1.09* 0.97 1.10*  GLUCOSE 121* 130* 120*    Electrolytes  Recent Labs Lab 09/26/15 0310 09/27/15 0414 09/28/15 0223  CALCIUM 8.4* 8.8* 8.9  MG  --  2.4  --     CBC  Recent Labs Lab 09/26/15 0540 09/27/15 0414 09/28/15 0223  WBC 17.8* 15.5* 17.0*  HGB 9.1* 9.1* 9.0*  HCT 30.7* 31.3* 31.6*  PLT 180 187 213    Coag's  Recent Labs Lab 09/25/15 2130  APTT 31  INR 1.13    Sepsis Markers  Recent Labs Lab 09/25/15 1700 09/26/15 0310 09/27/15 0414  LATICACIDVEN 1.1  --   --   PROCALCITON 0.60 0.63 0.42    ABG  Recent Labs Lab 09/25/15 1742 09/25/15 1759 09/26/15 0330  PHART 7.446 7.449 7.442  PCO2ART 56.6* 56.8* 56.8*  PO2ART 32.0* 57.0* 52.0*    Liver Enzymes No results for input(s): AST, ALT, ALKPHOS, BILITOT, ALBUMIN in the last 168 hours.  Cardiac Enzymes  Recent Labs Lab 09/25/15 1700 09/25/15 2130 09/26/15 0310  TROPONINI 0.11* 0.11* 0.09*     Glucose  Recent Labs Lab 09/27/15 0356 09/27/15 0846 09/27/15 1154 09/27/15 1618 09/27/15 2224 09/28/15 0751  GLUCAP 121* 150* 135* 135* 109* 123*    Imaging No results found.   STUDIES:  12/22 Echo >> EF 55 to 60%, PAS 66 mmHg 01/03 Doppler lower extremity >> negative 01/03 Echo >> EF 65 to 70%, severe TR, PAS 80 mmHg  CULTURES: 12/19 Blood >> negative 12/29 Urine >> negative  ANTIBIOTICS:  SIGNIFICANT EVENTS: 12/19 Admit 12/26 To OR for I&D Rt leg 12/31 Skin graft Rt leg 01/02 To ICU 01/05 To telemetry  LINES/TUBES: 1/02 ETT >> 1/04  DISCUSSION: 70 yo female former smoker was admitted with progressive dyspnea, cough, and fall with Rt leg contusion and UTI.  She developed progressive respiratory distress and transferred to ICU on 09/25/15.  She has hx of COPD on 3 liters oxygen, pulmonary hypertension, CAD, HTN, diastolic CHF, A fib on xarelto.  ASSESSMENT / PLAN:  PULMONARY A: Acute on chronic hypoxic/hypercapnic respiratory failure. AECOPD. Pulmonary HTN. ?sleep disordered breathing. P:   Oxygen to keep SpO2 90 to 95% D/c BiPAP >> she was unable to tolerate this  F/u CXR Brovana, pulmicort, spiriva Prn albuterol D/c solumedrol and change to prednisone 30 mg on 1/05 >> wean off as tolerated  CARDIOVASCULAR A:  Acute on chronic diastolic CHF. A fib on  xarelto as outpt. Hx of CAD, HLD, HTN. P:  Lovenox treatment dose for A fib >> consider starting coumadin per pharmacy once her leg wound is adequately healed and no further interventions needed Continue lipitor, catapres, cardizem  RENAL A:   AKI >> resolved. CKD stage II >> baseline creatinine is 0.94 from 09/11/15. Mild hematuria noted 1/04. P:   Monitor renal fx urine outpt  GASTROINTESTINAL A:   Constipation. Ileus >> improved 1/03. Nutrition. P:   CHO modified diet Protonix for SUP Bowel regimen  HEMATOLOGIC A:   Anemia of critical illness and chronic disease. P:  F/u  CBC  INFECTIOUS A:   Fever (Tm 101.5) on 09/25/15 >> none since. UTI >> completed Abx 09/15/15. P:   Monitor off Abx for now  ENDOCRINE A:   Steroid induced hyperglycemia. Hx of hypothyroidism. P:   SSI >> likely can d/c soon after weaning off steroids Synthroid  NEUROLOGIC A:   Acute encephalopathy 2nd to respiratory failure >> resolved. Deconditioning. P:   Monitor mental status PT/OT assessment  ORTHOPEDICS A: Rt leg wound s/p I&D and skin graft. P: Post op care per orthopedics  Updated pt's sister at bedside.  Transfer to tele 1/05 >> Back to [facility] 1/06 and PCCM sign off.   Chesley Mires, MD [facility] Pulmonary/Critical Care 09/28/2015, 9:03 AM Pager:  [phone] After 3pm call: [phone]

## 2015-09-28 NOTE — Progress Notes (Signed)
Patient ID: Caitlin Hampton, female   DOB: 10-10-1945, 70 y.o.   MRN: [id number] Can be on any anticoagulation from my standpoint.

## 2015-09-28 NOTE — Progress Notes (Signed)
Patient arrived to unit from 2S. Alert and oriented. Belongings at bedside.

## 2015-09-28 NOTE — Progress Notes (Signed)
Pt placed on BiPap by respiratory therapy at 11:30pm. 11:45pm pt ripped mask off adamantly refused to wear BiPap states she does not need it. This RN attempted re-education multiple times unsuccessfully.  Pt placed back on 6L [place] and will continue to monitor closely.

## 2015-09-28 NOTE — Progress Notes (Signed)
Nutrition Follow Up  DOCUMENTATION CODES:   Obesity unspecified  INTERVENTION:   Prostat liquid protein po 30 ml BID with meals, each supplement provides 100 kcal, 15 grams protein  NEW NUTRITION DIAGNOSIS:   Increased nutrient needs related to wound healing as evidenced by estimated needs, ongoing  GOAL:   Patient will meet greater than or equal to 90% of their needs, progressing  MONITOR:   PO intake, Supplement acceptance, Labs, Weight trends, Skin, I & O's  ASSESSMENT:   70 yo female former smoker was admitted with progressive dyspnea, cough, and fall with Rt leg contusion and UTI. She developed progressive respiratory distress and transferred to ICU on 09/25/15.  Patient s/p procedure 12/31: SPLIT-THICKNESS SKIN GRAFT FROM RIGHT THIGH DONOR SITE TO RIGHT LEG WOUND  Patient extubated 1/4.  Diet advanced to Carbohydrate Modified.  PO intake variable at 50% per flowsheet records.  Would benefit from addition of oral nutrition supplements to promote wound healing.  RD to order.  Diet Order:  Diet Carb Modified Fluid consistency:: Thin; Room service appropriate?: Yes  Skin:    R leg wound  Last BM:  12/31  Height:   Ht Readings from Last 1 Encounters:  09/11/15 5\' 4"  (1.626 m)    Weight:   Wt Readings from Last 1 Encounters:  09/28/15 214 lb 4.6 oz (97.2 kg)    Ideal Body Weight:  54.5 kg  BMI:  Body mass index is 36.76 kg/(m^2).  Estimated Nutritional Needs:   Kcal:  [id number]  Protein:  95-105 gm  Fluid:  1.7-1.9 L  EDUCATION NEEDS:   No education needs identified at this time  Arthur Holms, RD, LDN Pager #: [phone] After-Hours Pager #: [phone]

## 2015-09-28 NOTE — Progress Notes (Signed)
Occupational Therapy Treatment Patient Details Name: Caitlin Hampton MRN: [id number] DOB: [birth year] Today's Date: 09/28/2015    History of present illness Patient is an 70 year old female past history of COPD on chronic 3 L oxygen, atrial fibrillation on chronic anticoagulation and CAD who presented to ED with increased dyspnea on exertion and nonproductive cough, and s/p fall hitting her left leg which led to a large hematoma. Patient found to have acute respiratory failure requiring 5-6 L of oxygen.  Dr. Ninfa Linden took to surgery on 09/18/15 for I&D of Right leg wound and VAC placement.  Scheduled for another surgery 09/23/15 for a repeat I&D of the right leg and placement of a split-thickness skin graft   OT comments  Pt with increase confusion during this session demonstrating both delayed verbal and physical response to therapist directions and questions.  Only tolerated sitting EOB for 10 mins and did not want to try to transfer OOB to bedside chair.  Min guard assist for static sitting balance with pt demonstrating moderate trunk flexion in sitting and inability to sit unsupported for activity.  Oxygen sats decreasing to 88% on 5Ls nasal cannula in sitting as well.  Returned back to supine with mod assist to lift LEs in to the bed and max assist to scoot back to the [facility]  Anticipate slow progress and need for SNF for follow-up therapy.     Follow Up Recommendations  SNF;Supervision/Assistance - 24 hour    Equipment Recommendations  Other (comment) (TBD next venue of care)       Precautions / Restrictions Precautions Precautions: Fall Precaution Comments: Oxygen dependent Restrictions Weight Bearing Restrictions: No       Mobility Bed Mobility Overal bed mobility: Needs Assistance Bed Mobility: Supine to Sit;Sit to Supine     Supine to sit: Mod assist Sit to supine: Mod assist   General bed mobility comments: Needs assist with lifting trunk in to sitting with mod asssit for  scooting to the EOB.  Mod assist needed to lift LEs back in the bed with max assist to re-position further up toward the EOB.    Transfers                      Balance Overall balance assessment: Needs assistance   Sitting balance-Leahy Scale: Poor Sitting balance - Comments: Pt needing UE support to maintain balance EOB with moderate flexed trunk noted.  Pt would not sit upright secondary to stating she was dizzy.                             ADL Overall ADL's : Needs assistance/impaired     Grooming: Wash/dry hands;Wash/dry face;Sitting;Bed level                                 General ADL Comments: Limited session secondary to pt not wanting to try OOB.  She did agree to transfer to the EOB and then sat for 10 mins with BUE support.  Pt reporting dizziness and continaully sat with head and trunk flexed.  Unable to obtain BP in sitting secondary to therapist not feeling comfortable leaving pt on the EOB.  Pt with delayed verbal and physical responses throughout session as well.  Will need SNF placement for further rehab.                  Cognition  Behavior During Therapy: Flat affect Overall Cognitive Status: Impaired/Different from baseline Area of Impairment: Orientation Orientation Level: Situation;Time      Following Commands: Follows one step commands with increased time   Awareness: Intellectual   General Comments: Pt with delayed processing of verbal and physical instructions when given.  Difficulty understanding why the BP cuff had to squeeze her arm to get her BP.  After being returned back to supine positon, pt still gripping rails and telling therapist to "hurry up" without any awareness that she was already laying down in the bed.                  Pertinent Vitals/ Pain       Pain Assessment: Faces Faces Pain Scale: Hurts little more Pain Location: RLE at graft sight Pain Descriptors / Indicators:  Discomfort;Grimacing Pain Intervention(s): Limited activity within patient's tolerance;Monitored during session;Repositioned         Frequency Min 2X/week     Progress Toward Goals  OT Goals(current goals can now be found in the care plan section)  Progress towards OT goals: Goals drowngraded-see care plan  Acute Rehab OT Goals Time For Goal Achievement: 10/12/15  Plan Discharge plan remains appropriate       End of Session Equipment Utilized During Treatment: Oxygen   Activity Tolerance Patient limited by fatigue   Patient Left in bed;with call bell/phone within reach   Nurse Communication Mobility status        Time: [id number] OT Time Calculation (min): 28 min  Charges: OT General Charges $OT Visit: 1 Procedure OT Treatments $Self Care/Home Management : 23-37 mins  Ivey Nembhard OTR/L 09/28/2015, 4:46 PM

## 2015-09-29 LAB — CBC
HCT: 30.8 % — ABNORMAL LOW (ref 36.0–46.0)
Hemoglobin: 8.7 g/dL — ABNORMAL LOW (ref 12.0–15.0)
MCH: 25.2 pg — ABNORMAL LOW (ref 26.0–34.0)
MCHC: 28.2 g/dL — ABNORMAL LOW (ref 30.0–36.0)
MCV: 89.3 fL (ref 78.0–100.0)
PLATELETS: 222 10*3/uL (ref 150–400)
RBC: 3.45 MIL/uL — AB (ref 3.87–5.11)
RDW: 20.9 % — AB (ref 11.5–15.5)
WBC: 14.9 10*3/uL — AB (ref 4.0–10.5)

## 2015-09-29 LAB — BASIC METABOLIC PANEL
ANION GAP: 9 (ref 5–15)
BUN: 50 mg/dL — ABNORMAL HIGH (ref 6–20)
CALCIUM: 8.8 mg/dL — AB (ref 8.9–10.3)
CO2: 35 mmol/L — ABNORMAL HIGH (ref 22–32)
Chloride: 94 mmol/L — ABNORMAL LOW (ref 101–111)
Creatinine, Ser: 1.18 mg/dL — ABNORMAL HIGH (ref 0.44–1.00)
GFR, EST AFRICAN AMERICAN: 53 mL/min — AB (ref >60)
GFR, EST NON AFRICAN AMERICAN: 46 mL/min — AB (ref >60)
Glucose, Bld: 122 mg/dL — ABNORMAL HIGH (ref 65–99)
POTASSIUM: 4.1 mmol/L (ref 3.5–5.1)
SODIUM: 138 mmol/L (ref 135–145)

## 2015-09-29 LAB — GLUCOSE, CAPILLARY
GLUCOSE-CAPILLARY: 140 mg/dL — AB (ref 65–99)
GLUCOSE-CAPILLARY: 150 mg/dL — AB (ref 65–99)
Glucose-Capillary: 148 mg/dL — ABNORMAL HIGH (ref 65–99)
Glucose-Capillary: 113 mg/dL — ABNORMAL HIGH (ref 65–99)

## 2015-09-29 MED ORDER — FUROSEMIDE 10 MG/ML IJ SOLN
40.0000 mg | Freq: Every day | INTRAMUSCULAR | Status: DC
  Administered 2015-09-29: 40 mg via INTRAVENOUS
  Filled 2015-09-29 (×2): qty 4

## 2015-09-29 NOTE — Care Management Important Message (Signed)
Important Message  Patient Details  Name: Caitlin Hampton MRN: [id number] Date of Birth: January 01, 1946   Medicare Important Message Given:  Yes    Nathen May 09/29/2015, 11:50 AM

## 2015-09-29 NOTE — Progress Notes (Signed)
Physical Therapy Treatment Patient Details Name: Caitlin Hampton MRN: [id number] DOB: 1945-10-13 Today's Date: 09/29/2015    History of Present Illness Patient is an 70 year old female past history of COPD on chronic 3 L oxygen, atrial fibrillation on chronic anticoagulation and CAD who presented to ED with increased dyspnea on exertion and nonproductive cough, and s/p fall hitting her left leg which led to a large hematoma. Patient found to have acute respiratory failure requiring 5-6 L of oxygen.  Dr. Ninfa Linden took to surgery on 09/18/15 for I&D of Right leg wound and VAC placement.  Scheduled for another surgery 09/23/15 for a repeat I&D of the right leg and placement of a split-thickness skin graft.  VAC discontinued 1/2 and successfully extubated 1/4.    PT Comments    Pt progressing slowly.  Somewhat self-limiting, but if encouraged and pressed will participate.  Emphasis on standing with RW and STEDY.  Follow Up Recommendations  SNF     Equipment Recommendations  None recommended by PT    Recommendations for Other Services       Precautions / Restrictions Precautions Precautions: Fall Precaution Comments: Oxygen dependent Restrictions Weight Bearing Restrictions: No    Mobility  Bed Mobility Overal bed mobility: Needs Assistance Bed Mobility: Supine to Sit;Rolling Rolling: Min assist Sidelying to sit: Min assist       General bed mobility comments: Assitance for minimal management of LEs and trunk to come into sitting. Pt using bed rails heavily.   Transfers Overall transfer level: Needs assistance Equipment used: Rolling walker (2 wheeled) Transfers: Sit to/from Stand Sit to Stand: Max assist;+2 physical assistance (x3)         General transfer comment: Pt self- limiting, but with encouragement able to calm her down enough for her to focus on task.  Ambulation/Gait                 Stairs            Wheelchair Mobility    Modified Rankin  (Stroke Patients Only)       Balance Overall balance assessment: Needs assistance Sitting-balance support: Bilateral upper extremity supported;Feet supported Sitting balance-Leahy Scale: Poor     Standing balance support: Bilateral upper extremity supported Standing balance-Leahy Scale: Zero Standing balance comment: first attempt pt assisted to stand into RW with significant help, The second and third stands were with the use of the STEDY. pt stood up no more than 15-20 seconds.                    Cognition Arousal/Alertness: Awake/alert Behavior During Therapy: Anxious;Flat affect Overall Cognitive Status: Impaired/Different from baseline Area of Impairment: Orientation;Following commands;Safety/judgement;Awareness;Problem solving;Memory Orientation Level: Disoriented to;Situation;Time   Memory: Decreased short-term memory Following Commands: Follows one step commands with increased time;Follows one step commands inconsistently Safety/Judgement: Decreased awareness of safety Awareness: Intellectual Problem Solving: Slow processing;Decreased initiation;Difficulty sequencing;Requires verbal cues;Requires tactile cues General Comments: Pt continiously stating "I can't" and "I need help" during this session. Therapist and NT continously stating why she was getting up and that we were there to help her.     Exercises General Exercises - Lower Extremity Hip Flexion/Marching: AROM;Strengthening;Both;10 reps;Seated (resisted gross extension)    General Comments        Pertinent Vitals/Pain Pain Assessment: Faces Faces Pain Scale: Hurts even more Pain Location: back Pain Descriptors / Indicators: Aching Pain Intervention(s): Monitored during session    Home Living  Prior Function            PT Goals (current goals can now be found in the care plan section) Acute Rehab PT Goals Patient Stated Goal: to not fall PT Goal Formulation: With  patient Time For Goal Achievement: 10/11/15 Potential to Achieve Goals: Good Progress towards PT goals: Progressing toward goals;Goals downgraded-see care plan    Frequency  Min 3X/week    PT Plan Current plan remains appropriate    Co-evaluation             End of Session Equipment Utilized During Treatment: Oxygen Activity Tolerance: Patient limited by fatigue Patient left: in chair;with call bell/phone within reach;with family/visitor present     Time: [id number] PT Time Calculation (min) (ACUTE ONLY): 23 min  Charges:  $Therapeutic Exercise: 23-37 mins                    G Codes:      Senora Lacson, Tessie Fass 09/29/2015, 1:18 PM 09/29/2015  Donnella Sham, PT [phone]  (pager)

## 2015-09-29 NOTE — Progress Notes (Signed)
ANTICOAGULATION CONSULT NOTE - Follow Up Consult  Pharmacy Consult for Lovenox Indication: atrial fibrillation  No Known Allergies  Patient Measurements: Height: 5\' 4"  (162.6 cm) Weight: 212 lb 8.4 oz (96.4 kg) IBW/kg (Calculated) : 54.7 Heparin Dosing Weight:    Vital Signs: Temp: 97.9 F (36.6 C) (01/06 0503) Temp Source: Oral (01/06 0503) BP: 148/66 mmHg (01/06 0503) Pulse Rate: 88 (01/06 0503)  Labs:  Recent Labs  09/27/15 0414 09/28/15 0223 09/29/15 0305  HGB 9.1* 9.0* 8.7*  HCT 31.3* 31.6* 30.8*  PLT 187 213 222  CREATININE 0.97 1.10* 1.18*    Estimated Creatinine Clearance: 50.7 mL/min (by C-G formula based on Cr of 1.18).   Assessment: CC: SOB, fall with large leg hematoma  AC: Lovenox for afib (PTA xarelto) - H/H 8.7/30.8 plts 222 relatively stable., no new bleeding noted - doppler negative for DVT. Lovenox 100mg  BID. Ortho left recommendation that pt can be on any anticoagulation from their standpoint now.  Goal of Therapy:  LMWH level 0.6-1.2 Monitor platelets by anticoagulation protocol: Yes   Plan:  Continue Lovenox 100mg  BID Monitor hematuria Resume Xarelto today??   Alford Highland, [employer] 09/29/2015,10:32 AM

## 2015-09-29 NOTE — Progress Notes (Signed)
Occupational Therapy Treatment Patient Details Name: Caitlin Hampton MRN: [id number] DOB: November 11, 1945 Today's Date: 09/29/2015    History of present illness Patient is an 70 year old female past history of COPD on chronic 3 L oxygen, atrial fibrillation on chronic anticoagulation and CAD who presented to ED with increased dyspnea on exertion and nonproductive cough, and s/p fall hitting her left leg which led to a large hematoma. Patient found to have acute respiratory failure requiring 5-6 L of oxygen.  Dr. Ninfa Linden took to surgery on 09/18/15 for I&D of Right leg wound and VAC placement.  Scheduled for another surgery 09/23/15 for a repeat I&D of the right leg and placement of a split-thickness skin graft   OT comments  Patient making slow progress towards OT goals, goals were downgraded last OT session. Pt continues to remain appropriate for SNF. Pt self-limiting and states "I can't!" and "I need help!". +2 required for OOB tasks, used STEDY due to patient's fatigue and lack of willingness. Pt would de-sat on 5L/min supplemental 02. Once in recliner, pt at 88% and required cueing for pursed lip breathing, within ~1 minute sats increased to 90%.    Follow Up Recommendations  SNF;Supervision/Assistance - 24 hour    Equipment Recommendations  Other (comment) (TBD next venue of care)    Recommendations for Other Services  None at this time   Precautions / Restrictions Precautions Precautions: Fall Precaution Comments: Oxygen dependent Restrictions Weight Bearing Restrictions: No    Mobility Bed Mobility Overal bed mobility: Needs Assistance Bed Mobility: Supine to Sit;Rolling Rolling: Min assist Sidelying to sit: Min assist General bed mobility comments: Assitance for minimal management of LEs and trunk to come into sitting. Pt using bed rails heavily.   Transfers Overall transfer level: Needs assistance   Transfers: Sit to/from Stand Sit to Stand: Max assist;+2 physical  assistance General transfer comment: Pt with increased anxiety which limits her ability to perform tasks. Pt continiously stating "I can't!"     Balance Overall balance assessment: Needs assistance Sitting-balance support: Bilateral upper extremity supported;Feet supported Sitting balance-Leahy Scale: Poor       Standing balance-Leahy Scale: Zero Standing balance comment: pt in stedy and unable to hold self upright, this could be due to behavior?/not believing she can do it herself?    ADL Overall ADL's : Needs assistance/impaired   General ADL Comments: Pt found supine in bed with RN present completing dressing changes. Therapist assisted with mobility and psychosocial support during this. Pt engaged in bed mobility and sat EOB. Sitting EOB, pt scared to lift up RUE to complete grooming task of brushing hair. With max encouragement pt performed 2 strokes of brushing hair. Due to patient's poor endurance, therapist used STEDY for sit to stand transfer and transfer into recliner. Pt continously stating "I can't" and "I need help!". Throughout session, pt required max multimodal cueing and max encouragement for participation and initiation.      Cognition   Behavior During Therapy: Flat affect Overall Cognitive Status: Impaired/Different from baseline Area of Impairment: Orientation;Following commands;Safety/judgement;Awareness;Problem solving;Memory Orientation Level: Disoriented to;Situation;Time   Memory: Decreased short-term memory  Following Commands: Follows one step commands with increased time;Follows one step commands inconsistently (behavior causing pt folloing one step commands inconsistently??) Safety/Judgement: Decreased awareness of safety Awareness: Intellectual Problem Solving: Slow processing;Decreased initiation;Difficulty sequencing;Requires verbal cues;Requires tactile cues General Comments: Pt continiously stating "I can't" and "I need help" during this session.  Therapist and NT continously stating why she was getting up and that we were  there to help her.                  Pertinent Vitals/ Pain       Pain Assessment: Faces Faces Pain Scale: Hurts even more Pain Location: RLE at graft site  Pain Descriptors / Indicators: Aching;Guarding;Grimacing;Discomfort Pain Intervention(s): Monitored during session;Repositioned   Frequency Min 2X/week     Progress Toward Goals  OT Goals(current goals can now befound in the care plan section)  Progress towards OT goals: Progressing toward goals  Acute Rehab OT Goals Patient Stated Goal: to not fall  Plan Discharge plan remains appropriate    End of Session Equipment Utilized During Treatment: Oxygen   Activity Tolerance Patient limited by fatigue   Patient Left in chair;with call bell/phone within reach;with nursing/sitter in room    Time: [id number] OT Time Calculation (min): 45 min  Charges: OT General Charges $OT Visit: 1 Procedure OT Treatments $Self Care/Home Management : 38-52 mins  Chrys Racer , MS, OTR/L, CLT Pager: [phone]  09/29/2015, 9:42 AM

## 2015-09-29 NOTE — Consult Note (Signed)
WOC wound consult note Reason for Consult: right heel wound. Patient S/P STGG right leg.   Wound type: Unstageable Pressure Injury Pressure Ulcer POA: Yes Measurement: 2.0cmx 2.5cm x 0cm  Wound bed: 100% eschar Drainage (amount, consistency, odor) none Periwound: intact Dressing procedure/placement/frequency: Paint right heel with betadine swab daily, allow to air dry.  Leave open to air. Offload while in bed.  Discussed POC with patient and bedside nurse.  Re consult if needed, will not follow at this time. Thanks  Judiann Celia Kellogg, [place] [phone])

## 2015-09-29 NOTE — Clinical Social Work Note (Signed)
Patient has [employer] authorization to discharge to [facility] if medically stable over the weekend.  [id number] Next review date: 10/02/2015 RUG level: [place], [place] [facility]: [phone] Surgical: [phone]

## 2015-09-29 NOTE — Progress Notes (Signed)
Patient Demographics  Caitlin Hampton, is a 70 y.o. female, DOB - 12-27-1945, [id number]  Admit date - 09/11/2015   Admitting Physician Annita Brod, MD  Outpatient Primary MD for the patient is [name], MD  LOS - 18   Chief Complaint  Patient presents with  . Shortness of Breath  . Fall       Admission HPI/Brief narrative: Patient is an 70 year old female past oral history of COPD on chronic 3 L oxygen, atrial fibrillation on chronic anticoagulation and CAD presents with fall, and left lower extremity hematoma, acute on chronic respiratory failure secondary to pneumonia and COPD exacerbation, as well had sepsis secondary to UTI and pneumonia. As well patient noted to have acute on chronic respiratory failure secondary to volume overload, patient with acute worsening respiratory failure secondary to COPD exacerbation required intubation 09/25/2015, successfully extubated 09/27/2015.  Dr Ninfa Linden was consulted for evacuation of right lower extremity hematoma.went to or 12/26 for IND, and 12/31 for skin graft .   Subjective:   Caitlin Hampton today has, No headache, No chest pain, No abdominal pain - No Nausea, drains of mild dyspnea, denies any cough or productive sputum . Assessment & Plan    Principal Problem:   Acute and chronic respiratory failure with hypoxia (HCC) Active Problems:   Hypertension   Hypothyroidism   Acute on chronic diastolic CHF (congestive heart failure) (HCC)   HX: anticoagulation   Morbid obesity (HCC)   COPD exacerbation (HCC)   Sepsis ([place])   Chronic a-fib (HCC)   UTI (urinary tract infection)   Hematoma of right lower extremity   CAP (community acquired pneumonia)   Pressure ulcer  Acute on chronic hypoxic respiratory failure  - Appears to be multifactorial, secondary to acute on chronic diastolic CHF, COPD exacerbation, and pneumonia . - Patient finished  antibiotic treatment for her pneumonia . - On tapering dose steroid for COPD acute exacerbation. - Successfully extubated 09/27/2015. - Try to wean oxygen back to baseline  CAP - Treated  Acute COPD exacerbation - Continue with the steroid taper, - Continue with nebs as needed - Continue with pro time and, Pulmicort, and Spiriva  Acute on chronic diastolic CHF - Cardiac enzymes negative 3. TSH within normal limits. 2-D echo with EF of 55-60% with no wall motion abnormalities. Severely elevated pulmonary pressure. - Agent with basilar crackles this morning, will start an IV Lasix.  Right lower extremity hematoma status post fall - Shaped on chronic anticoagulation - Seen by Dr. Ninfa Linden, status post IND 09/18/2015, skin graft 09/23/2015, wound VAC discontinued 09/25/2015  Sepsis - Present on admission, resolved  Acute blood loss anemia - secondary to hematoma. hemoglobin as low as 6.3, required 2 unit transfusion on 12/27 .  Hypothyroidism - Continue with Synthroid  Chronic atrial fibrillation - ChadsVasc >2,  on Xarelto at home, cleared by orthopedic to resume anticoagulation, currently on Lovenox  Code Status: Full  Family Communication: Discussed with patient  Disposition Plan: Further workup   Procedures   Bedside I and D and evacuation of right lower extremity hematoma 09/14/2015  2-D echo 09/14/2015  2 units packed red blood cells 09/19/2015  OR for skin grafting 12/31  Intubated 09/25/2015, extubated 09/27/2015   [place]  Dr. Ninfa Linden   Medications  Scheduled Meds: . antiseptic oral rinse  7 mL Mouth Rinse BID  . arformoterol  15 mcg Nebulization BID  . atorvastatin  10 mg Oral QHS  . budesonide  0.5 mg Nebulization BID  . cloNIDine  0.1 mg Oral BID  . diltiazem  180 mg Oral Daily  . enoxaparin (LOVENOX) injection  100 mg Subcutaneous Q12H  . feeding supplement (PRO-STAT SUGAR FREE 64)  30 mL Oral BID  . insulin aspart  0-15 Units  Subcutaneous TID WC  . insulin aspart  0-5 Units Subcutaneous QHS  . levothyroxine  100 mcg Oral QAC breakfast  . pantoprazole  40 mg Oral Q1200  . polyethylene glycol  17 g Oral Daily  . predniSONE  30 mg Oral Q breakfast  . senna-docusate  1 tablet Oral Daily  . sodium chloride  3 mL Intravenous Q12H  . tiotropium  18 mcg Inhalation Daily   Continuous Infusions:  PRN Meds:.sodium chloride, acetaminophen (TYLENOL) oral liquid 160 mg/5 mL, albuterol, fentaNYL (SUBLIMAZE) injection, [DISCONTINUED] ondansetron **OR** ondansetron (ZOFRAN) IV  DVT Prophylaxis  Lovenox   Lab Results  Component Value Date   PLT 222 09/29/2015    Antibiotics    Anti-infectives    Start     Dose/Rate Route Frequency Ordered Stop   09/23/15 0715  ceFAZolin (ANCEF) 2-3 GM-% IVPB SOLR    Comments:  Marinda Elk   : cabinet override      09/23/15 0715 09/23/15 0736   09/13/15 1800  cefTRIAXone (ROCEPHIN) 1 g in dextrose 5 % 50 mL IVPB     1 g 100 mL/hr over 30 Minutes Intravenous Every 24 hours 09/13/15 0948 09/15/15 1746   09/11/15 1915  cefTRIAXone (ROCEPHIN) 1 g in dextrose 5 % 50 mL IVPB  Status:  Discontinued     1 g 100 mL/hr over 30 Minutes Intravenous  Once 09/11/15 1911 09/11/15 1914   09/11/15 1915  azithromycin (ZITHROMAX) 500 mg in dextrose 5 % 250 mL IVPB  Status:  Discontinued     500 mg 250 mL/hr over 60 Minutes Intravenous  Once 09/11/15 1911 09/11/15 1914   09/11/15 1915  azithromycin (ZITHROMAX) 500 mg in dextrose 5 % 250 mL IVPB  Status:  Discontinued     500 mg 250 mL/hr over 60 Minutes Intravenous Every 24 hours 09/11/15 1914 09/13/15 0948   09/11/15 1915  cefTRIAXone (ROCEPHIN) 2 g in dextrose 5 % 50 mL IVPB  Status:  Discontinued     2 g 100 mL/hr over 30 Minutes Intravenous Every 24 hours 09/11/15 1914 09/13/15 0948          Objective:   Filed Vitals:   09/28/15 1505 09/28/15 2138 09/29/15 0503 09/29/15 1031  BP: 152/68 130/62 148/66   Pulse: 86 70 88   Temp:  98.5  F (36.9 C) 97.9 F (36.6 C)   TempSrc:  Oral Oral   Resp:  18 18   Height:      Weight:   96.4 kg (212 lb 8.4 oz)   SpO2: 88% 96% 97% 94%    Wt Readings from Last 3 Encounters:  09/29/15 96.4 kg (212 lb 8.4 oz)  08/25/15 110.224 kg (243 lb)  02/06/15 105.416 kg (232 lb 6.4 oz)     Intake/Output Summary (Last 24 hours) at 09/29/15 1143 Last data filed at 09/29/15 0800  Gross per 24 hour  Intake    360 ml  Output    425 ml  Net    -  65 ml     Physical Exam  Awake Alert, Oriented X 3, No new F.N deficits, Normal affect [place].AT,PERRAL Supple Neck,No JVD, No cervical lymphadenopathy appriciated.  Symmetrical Chest wall movement,bibasilar crackles, no wheezing . No Gallops,Rubs or new Murmurs, No Parasternal Heave +ve B.Sounds, Abd Soft, No tenderness, No organomegaly appriciated, No rebound - guarding or rigidity. No Cyanosis,right lower extremity wrapped with Ace wrap,   Data Review   Micro Results No results found for this or any previous visit (from the past 240 hour(s)).  Radiology Reports Dg Chest 2 View  09/14/2015  CLINICAL DATA:  Shortness of Breath EXAM: CHEST  2 VIEW COMPARISON:  September 11, 2015 FINDINGS: There is patchy atelectasis in each lung base. There is mild interstitial edema with cardiomegaly and borderline pulmonary venous hypertension. No adenopathy. There is atherosclerotic calcification in the aorta. There is degenerative change in the thoracic spine. There is a small right pleural effusion. IMPRESSION: Findings indicative of a degree of congestive heart failure. Electronically Signed   By: Lowella Grip III M.D.   On: 09/14/2015 09:59   Dg Chest Port 1 View  09/27/2015  CLINICAL DATA:  Follow-up respiratory failure. EXAM: PORTABLE CHEST 1 VIEW COMPARISON:  09/26/2015 FINDINGS: Endotracheal tube unchanged with tip 2.5 cm above the carina. Nasogastric tube courses through the stomach as tip is not definitely visualized. Lungs are adequately inflated  with mild stable prominence of the perihilar markings. Minimal patchy opacification over the lateral right upper lobe and right base without significant change as this may be due to ongoing infection. No evidence of effusion. Stable cardiomegaly. Remainder of the exam is unchanged. IMPRESSION: Subtle patchy density over the lateral right upper lobe and right base which may be due to infection. Stable cardiomegaly with suggestion of mild vascular congestion. Electronically Signed   By: Marin Olp M.D.   On: 09/27/2015 08:06   Dg Chest Port 1 View  09/26/2015  CLINICAL DATA:  Acute respiratory failure, CHF, COPD, sepsis, morbid obesity. EXAM: PORTABLE CHEST 1 VIEW COMPARISON:  Portable chest x-ray of September 25, 2015 FINDINGS: The lungs are reasonably well inflated. The interstitial markings remain coarse bilaterally. There is new atelectasis or early infiltrate at the lung bases. There is no pleural effusion or pneumothorax. The cardiac silhouette is enlarged. The pulmonary vascularity is not engorged. The endotracheal tube tip lies 2.6 cm above the carina. The esophagogastric tube tip projects below the inferior margin of the image. IMPRESSION: Interval development of subsegmental atelectasis at the lung bases. Stable cardiomegaly and bilateral pulmonary interstitial prominence. Electronically Signed   By: David  Martinique M.D.   On: 09/26/2015 07:38   Dg Chest Port 1 View  09/25/2015  CLINICAL DATA:  Respiratory failure. EXAM: PORTABLE CHEST 1 VIEW COMPARISON:  Earlier today at 1701 hours FINDINGS: Endotracheal tube terminates approximately 15 mm above the carina. Nasogastric tube is poorly visualized distally but likely terminates at the distal stomach. Numerous leads and wires project over the chest. Cardiomegaly accentuated by AP portable technique. No pleural effusion or pneumothorax. Low lung volumes with resultant pulmonary interstitial prominence. No lobar consolidation. Mild pulmonary venous congestion  remains. IMPRESSION: Cardiomegaly and low lung volumes with persistent pulmonary venous congestion. Endotracheal tube terminating 1.5 cm above carina. Consider retraction 2-3 cm. Electronically Signed   By: Abigail Miyamoto M.D.   On: 09/25/2015 18:41   Dg Chest Port 1 View  09/25/2015  CLINICAL DATA:  Acute respiratory failure. Endotracheal tube placement. EXAM: PORTABLE CHEST 1 VIEW COMPARISON:  09/25/2015 FINDINGS: Endotracheal tube has been placed, tip overlying the level of the lower trachea, 1.6 cm above the carina. Consider withdrawing endotracheal tube 1-2 cm. The heart is enlarged. The left hemidiaphragm is elevated and stable. There is mild right lower lobe atelectasis. There has been some improvement of interstitial edema. Nasogastric tube is in place, tip in the stomach. IMPRESSION: 1. Interval placement of nasogastric tube ; placement of endotracheal tube with tip 1.6 cm above carina. 2. Mild improvement in pulmonary edema. Electronically Signed   By: Nolon Nations M.D.   On: 09/25/2015 17:47   Dg Chest Port 1 View  09/25/2015  CLINICAL DATA:  70 year old female with shortness of breath. EXAM: PORTABLE CHEST 1 VIEW COMPARISON:  09/22/2015 and prior exams FINDINGS: Cardiomegaly and bilateral interstitial opacities are unchanged. Mild bibasilar atelectasis again noted. There is no evidence of pneumothorax. No other interval changes identified. IMPRESSION: Unchanged appearance of the chest with cardiomegaly, probable interstitial pulmonary edema and basilar atelectasis. Electronically Signed   By: Margarette Canada M.D.   On: 09/25/2015 09:00   Dg Chest Port 1 View  09/22/2015  CLINICAL DATA:  Dyspnea. EXAM: PORTABLE CHEST 1 VIEW COMPARISON:  09/14/2015. FINDINGS: Stable enlarged cardiac silhouette. Mild increase in prominence of the pulmonary vasculature and interstitial markings. No visible pleural fluid. Unremarkable bones. IMPRESSION: Mildly progressive changes of congestive heart failure.  Electronically Signed   By: Claudie Revering M.D.   On: 09/22/2015 13:51   Dg Chest Portable 1 View  09/11/2015  CLINICAL DATA:  Shortness of breath 1 day. EXAM: PORTABLE CHEST 1 VIEW COMPARISON:  12/13/2014 and 12/11/2014 FINDINGS: Lordotic technique demonstrated. Subtle stable elevation of the left hemidiaphragm. Lungs are hypoinflated with minimal prominence of the central perihilar markings likely minimal vascular congestion. No definite effusion. No lobar consolidation. Stable moderate cardiomegaly. There is calcified plaque over the aortic arch. Remainder of the exam is unchanged. IMPRESSION: Stable cardiomegaly with findings suggesting mild vascular congestion. Electronically Signed   By: Marin Olp M.D.   On: 09/11/2015 15:26   Dg Tibia/fibula Right Port  09/11/2015  CLINICAL DATA:  Status post fall today with a right lower leg injury and pain. Initial encounter. EXAM: PORTABLE RIGHT TIBIA AND FIBULA - 2 VIEW COMPARISON:  None. FINDINGS: No acute bony or joint abnormality is identified. Degenerative change about the right knee is noted. Scattered atherosclerotic calcifications are seen. IMPRESSION: No acute abnormality. Electronically Signed   By: Inge Rise M.D.   On: 09/11/2015 15:26   Dg Abd Portable 1v  09/25/2015  CLINICAL DATA:  Abdominal distention. EXAM: PORTABLE ABDOMEN - 1 VIEW COMPARISON:  None. FINDINGS: The stomach shows gaseous distention. There is a fairly large amount of fecal material throughout the colon without evidence of significant focal impaction or bowel obstruction. Some gas in nondilated small bowel loops may also be consistent with a mild component of small bowel ileus. No abnormal calcifications or soft tissue abnormalities identified. IMPRESSION: Gaseous distention of stomach. Diffuse fecal material throughout the colon. Gas in nondilated small bowel loops may be reflective of a mild component of small bowel ileus. Electronically Signed   By: Aletta Edouard M.D.    On: 09/25/2015 11:28     CBC  Recent Labs Lab 09/25/15 0431 09/26/15 0540 09/27/15 0414 09/28/15 0223 09/29/15 0305  WBC 18.2* 17.8* 15.5* 17.0* 14.9*  HGB 9.3* 9.1* 9.1* 9.0* 8.7*  HCT 32.4* 30.7* 31.3* 31.6* 30.8*  PLT 216 180 187 213 222  MCV 88.0 88.0 89.4 88.5  89.3  MCH 25.3* 26.1 26.0 25.2* 25.2*  MCHC 28.7* 29.6* 29.1* 28.5* 28.2*  RDW 21.4* 21.4* 21.2* 21.0* 20.9*    Chemistries   Recent Labs Lab 09/25/15 0431 09/26/15 0310 09/27/15 0414 09/28/15 0223 09/29/15 0305  NA 135 139 141 140 138  K 4.5 3.7 4.0 3.7 4.1  CL 90* 92* 94* 94* 94*  CO2 35* 36* 37* 35* 35*  GLUCOSE 113* 121* 130* 120* 122*  BUN 30* 27* 36* 44* 50*  CREATININE 1.44* 1.09* 0.97 1.10* 1.18*  CALCIUM 8.5* 8.4* 8.8* 8.9 8.8*  MG  --   --  2.4  --   --    ------------------------------------------------------------------------------------------------------------------ estimated creatinine clearance is 50.7 mL/min (by C-G formula based on Cr of 1.18). ------------------------------------------------------------------------------------------------------------------ No results for input(s): HGBA1C in the last 72 hours. ------------------------------------------------------------------------------------------------------------------ No results for input(s): CHOL, HDL, LDLCALC, TRIG, CHOLHDL, LDLDIRECT in the last 72 hours. ------------------------------------------------------------------------------------------------------------------ No results for input(s): TSH, T4TOTAL, T3FREE, THYROIDAB in the last 72 hours.  Invalid input(s): FREET3 ------------------------------------------------------------------------------------------------------------------ No results for input(s): VITAMINB12, FOLATE, FERRITIN, TIBC, IRON, RETICCTPCT in the last 72 hours.  Coagulation profile  Recent Labs Lab 09/25/15 2130  INR 1.13    No results for input(s): DDIMER in the last 72 hours.  Cardiac  Enzymes  Recent Labs Lab 09/25/15 1700 09/25/15 2130 09/26/15 0310  TROPONINI 0.11* 0.11* 0.09*   ------------------------------------------------------------------------------------------------------------------ Invalid input(s): POCBNP     Time Spent in minutes   30 minutes   Kiki Bivens M.D on 09/29/2015 at 11:43 AM  Between 7am to 7pm - Pager - [phone]  After 7pm go to [email or web address] - password [facility]  [facility]   Office  [phone]

## 2015-09-30 LAB — GLUCOSE, CAPILLARY
GLUCOSE-CAPILLARY: 105 mg/dL — AB (ref 65–99)
Glucose-Capillary: 104 mg/dL — ABNORMAL HIGH (ref 65–99)
Glucose-Capillary: 152 mg/dL — ABNORMAL HIGH (ref 65–99)
Glucose-Capillary: 107 mg/dL — ABNORMAL HIGH (ref 65–99)

## 2015-09-30 LAB — BASIC METABOLIC PANEL
ANION GAP: 9 (ref 5–15)
BUN: 53 mg/dL — AB (ref 6–20)
CHLORIDE: 95 mmol/L — AB (ref 101–111)
CO2: 37 mmol/L — ABNORMAL HIGH (ref 22–32)
Calcium: 8.6 mg/dL — ABNORMAL LOW (ref 8.9–10.3)
Creatinine, Ser: 1.04 mg/dL — ABNORMAL HIGH (ref 0.44–1.00)
GFR, EST NON AFRICAN AMERICAN: 54 mL/min — AB (ref >60)
Glucose, Bld: 109 mg/dL — ABNORMAL HIGH (ref 65–99)
POTASSIUM: 4 mmol/L (ref 3.5–5.1)
SODIUM: 141 mmol/L (ref 135–145)

## 2015-09-30 LAB — CBC
HEMATOCRIT: 29.8 % — AB (ref 36.0–46.0)
HEMOGLOBIN: 8.4 g/dL — AB (ref 12.0–15.0)
MCH: 25.1 pg — ABNORMAL LOW (ref 26.0–34.0)
MCHC: 28.2 g/dL — ABNORMAL LOW (ref 30.0–36.0)
MCV: 89.2 fL (ref 78.0–100.0)
Platelets: 209 10*3/uL (ref 150–400)
RBC: 3.34 MIL/uL — AB (ref 3.87–5.11)
RDW: 20.8 % — ABNORMAL HIGH (ref 11.5–15.5)
WBC: 12 10*3/uL — AB (ref 4.0–10.5)

## 2015-09-30 MED ORDER — BISACODYL 10 MG RE SUPP
10.0000 mg | Freq: Every day | RECTAL | Status: DC | PRN
  Administered 2015-10-01: 10 mg via RECTAL
  Filled 2015-09-30: qty 1

## 2015-09-30 MED ORDER — SENNOSIDES-DOCUSATE SODIUM 8.6-50 MG PO TABS
2.0000 | ORAL_TABLET | Freq: Two times a day (BID) | ORAL | Status: DC
  Administered 2015-09-30: 2 via ORAL
  Administered 2015-09-30: 1 via ORAL
  Administered 2015-10-01 – 2015-10-03 (×5): 2 via ORAL
  Filled 2015-09-30 (×7): qty 2

## 2015-09-30 MED ORDER — FUROSEMIDE 10 MG/ML IJ SOLN
40.0000 mg | Freq: Two times a day (BID) | INTRAMUSCULAR | Status: DC
  Administered 2015-09-30 – 2015-10-02 (×5): 40 mg via INTRAVENOUS
  Filled 2015-09-30 (×5): qty 4

## 2015-09-30 MED ORDER — PREDNISONE 20 MG PO TABS
20.0000 mg | ORAL_TABLET | Freq: Every day | ORAL | Status: DC
  Administered 2015-09-30 – 2015-10-02 (×3): 20 mg via ORAL
  Filled 2015-09-30 (×3): qty 1

## 2015-09-30 MED ORDER — SORBITOL 70 % SOLN
960.0000 mL | TOPICAL_OIL | Freq: Once | ORAL | Status: AC
  Administered 2015-09-30: 960 mL via RECTAL
  Filled 2015-09-30 (×2): qty 240

## 2015-09-30 MED ORDER — RIVAROXABAN 20 MG PO TABS
20.0000 mg | ORAL_TABLET | Freq: Every day | ORAL | Status: DC
  Administered 2015-09-30 – 2015-10-02 (×3): 20 mg via ORAL
  Filled 2015-09-30 (×3): qty 1

## 2015-09-30 NOTE — Progress Notes (Signed)
Patient ID: Caitlin Hampton, female   DOB: 1946/08/07, 70 y.o.   MRN: [id number] Skin graft on right leg still looks good.  I clarified with nursing that the current Xeroform I placed over her skin graft as well as the current Xeroform on the donor site at her thigh should stay on and not be changed at all.  Nursing can place ABD pads, kerlex and ace wrap over her thigh as needed for drainage.  Still should float her right heel.

## 2015-09-30 NOTE — Progress Notes (Signed)
Patient Demographics  Caitlin Hampton, is a 70 y.o. female, DOB - [birth year], [id number]  Admit date - 09/11/2015   Admitting Physician Annita Brod, MD  Outpatient Primary MD for the patient is [name], MD  LOS - [phone]   Chief Complaint  Patient presents with  . Shortness of Breath  . Fall       Admission HPI/Brief narrative: Patient is an 70 year old female past oral history of COPD on chronic 3 L oxygen, atrial fibrillation on chronic anticoagulation and CAD presents with fall, and left lower extremity hematoma, acute on chronic respiratory failure secondary to pneumonia and COPD exacerbation, as well had sepsis secondary to UTI and pneumonia. Dr Ninfa Linden was consulted for evacuation of right lower extremity hematoma.went to or 12/26 for I&D, and 12/31 for skin graft . As well patient noted to have acute on chronic respiratory failure secondary to volume overload, patient with acute worsening respiratory failure secondary to COPD exacerbation required intubation 09/25/2015, successfully extubated 09/27/2015.  Subjective:   Caitlin Hampton today has, No headache, No chest pain, No abdominal pain - No Nausea, drains of mild dyspnea, denies any cough or productive sputum . Assessment & Plan    Principal Problem:   Acute and chronic respiratory failure with hypoxia (HCC) Active Problems:   Hypertension   Hypothyroidism   Acute on chronic diastolic CHF (congestive heart failure) (HCC)   HX: anticoagulation   Morbid obesity (HCC)   COPD exacerbation (HCC)   Sepsis ([place])   Chronic a-fib (HCC)   UTI (urinary tract infection)   Hematoma of right lower extremity   CAP (community acquired pneumonia)   Pressure ulcer  Acute on chronic hypoxic respiratory failure  - Appears to be multifactorial, secondary to acute on chronic diastolic CHF, COPD exacerbation, and pneumonia . - Patient finished  antibiotic treatment for her pneumonia . - On tapering dose steroid for COPD acute exacerbation. - Successfully extubated 09/27/2015. - Try to wean oxygen back to baseline  Right lower extremity hematoma status post fall - Patient on chronic anticoagulation - Seen by Dr. Ninfa Linden, status post I&D 09/18/2015, skin graft 09/23/2015, wound VAC discontinued 09/25/2015 - Followed by orthopedic  CAP - Treated  Acute COPD exacerbation - Continue with the steroid taper, I'll decrease prednisone to 20 mg oral daily, - Continue with nebs as needed - Continue with brovana , Pulmicort, and Spiriva  Acute on chronic diastolic CHF - Cardiac enzymes negative 3. TSH within normal limits. 2-D echo with EF of 55-60% with no wall motion abnormalities. Severely elevated pulmonary pressure. - Remains with overall significant positive balance, increase IV Lasix today.  Sepsis - Present on admission, resolved  Acute blood loss anemia - secondary to hematoma. hemoglobin as low as 6.3, required 2 unit transfusion on 12/27 . - Monitor hemoglobin closely,   Hypothyroidism - Continue with Synthroid  Chronic atrial fibrillation - ChadsVasc >2,  on Xarelto at home, cleared by orthopedic to resume anticoagulation, will transition from Lovenox to Xarelto.  Code Status: Full  Family Communication: Discussed with patient  Disposition Plan: We'll need SNF placement, in  48-72 hours after appropriately diuresed and respiratory status improved.   Procedures   Bedside I and D and evacuation of right lower extremity hematoma 09/14/2015  2-D echo 09/14/2015  2 units packed red blood cells 09/19/2015  OR for skin grafting 12/31  Intubated 09/25/2015, extubated 09/27/2015   Consults  PCCM Orthopedic Dr. Ninfa Linden   Medications  Scheduled Meds: . antiseptic oral rinse  7 mL Mouth Rinse BID  . arformoterol  15 mcg Nebulization BID  . atorvastatin  10 mg Oral QHS  . budesonide  0.5 mg Nebulization BID   . cloNIDine  0.1 mg Oral BID  . diltiazem  180 mg Oral Daily  . feeding supplement (PRO-STAT SUGAR FREE 64)  30 mL Oral BID  . furosemide  40 mg Intravenous BID  . insulin aspart  0-15 Units Subcutaneous TID WC  . insulin aspart  0-5 Units Subcutaneous QHS  . levothyroxine  100 mcg Oral QAC breakfast  . pantoprazole  40 mg Oral Q1200  . polyethylene glycol  17 g Oral Daily  . predniSONE  20 mg Oral Q breakfast  . rivaroxaban  20 mg Oral Q supper  . senna-docusate  1 tablet Oral Daily  . sodium chloride  3 mL Intravenous Q12H  . tiotropium  18 mcg Inhalation Daily   Continuous Infusions:  PRN Meds:.sodium chloride, acetaminophen (TYLENOL) oral liquid 160 mg/5 mL, albuterol, fentaNYL (SUBLIMAZE) injection, [DISCONTINUED] ondansetron **OR** ondansetron (ZOFRAN) IV  DVT Prophylaxis  Lovenox   Lab Results  Component Value Date   PLT 209 09/30/2015    Antibiotics    Anti-infectives    Start     Dose/Rate Route Frequency Ordered Stop   09/23/15 0715  ceFAZolin (ANCEF) 2-3 GM-% IVPB SOLR    Comments:  Marinda Elk   : cabinet override      09/23/15 0715 09/23/15 0736   09/13/15 1800  cefTRIAXone (ROCEPHIN) 1 g in dextrose 5 % 50 mL IVPB     1 g 100 mL/hr over 30 Minutes Intravenous Every 24 hours 09/13/15 0948 09/15/15 1746   09/11/15 1915  cefTRIAXone (ROCEPHIN) 1 g in dextrose 5 % 50 mL IVPB  Status:  Discontinued     1 g 100 mL/hr over 30 Minutes Intravenous  Once 09/11/15 1911 09/11/15 1914   09/11/15 1915  azithromycin (ZITHROMAX) 500 mg in dextrose 5 % 250 mL IVPB  Status:  Discontinued     500 mg 250 mL/hr over 60 Minutes Intravenous  Once 09/11/15 1911 09/11/15 1914   09/11/15 1915  azithromycin (ZITHROMAX) 500 mg in dextrose 5 % 250 mL IVPB  Status:  Discontinued     500 mg 250 mL/hr over 60 Minutes Intravenous Every 24 hours 09/11/15 1914 09/13/15 0948   09/11/15 1915  cefTRIAXone (ROCEPHIN) 2 g in dextrose 5 % 50 mL IVPB  Status:  Discontinued     2 g 100 mL/hr  over 30 Minutes Intravenous Every 24 hours 09/11/15 1914 09/13/15 0948          Objective:   Filed Vitals:   09/29/15 2157 09/30/15 0538 09/30/15 0757 09/30/15 0801  BP:  125/73    Pulse:  71    Temp:  97.7 F (36.5 C)    TempSrc:  Oral    Resp:  20    Height:      Weight:  102.3 kg (225 lb 8.5 oz)    SpO2: 92% 98% 94% 96%    Wt Readings from Last 3 Encounters:  09/30/15 102.3 kg (225 lb 8.5 oz)  08/25/15 110.224 kg (243 lb)  02/06/15 105.416 kg (232 lb 6.4 oz)     Intake/Output Summary (Last  24 hours) at 09/30/15 1119 Last data filed at 09/30/15 0900  Gross per 24 hour  Intake    480 ml  Output   1150 ml  Net   -670 ml     Physical Exam  Awake Alert, Oriented X 3, No new F.N deficits, Normal affect [place].AT,PERRAL Supple Neck,No JVD, No cervical lymphadenopathy appriciated.  Symmetrical Chest wall movement,bibasilar crackles, mild scattered wheezing . No Gallops,Rubs or new Murmurs, No Parasternal Heave +ve B.Sounds, Abd Soft, No tenderness, No organomegaly appriciated, No rebound - guarding or rigidity. No Cyanosis,right lower extremity wrapped with Ace wrap,   Data Review   Micro Results No results found for this or any previous visit (from the past 240 hour(s)).  Radiology Reports Dg Chest 2 View  09/14/2015  CLINICAL DATA:  Shortness of Breath EXAM: CHEST  2 VIEW COMPARISON:  September 11, 2015 FINDINGS: There is patchy atelectasis in each lung base. There is mild interstitial edema with cardiomegaly and borderline pulmonary venous hypertension. No adenopathy. There is atherosclerotic calcification in the aorta. There is degenerative change in the thoracic spine. There is a small right pleural effusion. IMPRESSION: Findings indicative of a degree of congestive heart failure. Electronically Signed   By: Lowella Grip III M.D.   On: 09/14/2015 09:59   Dg Chest Port 1 View  09/27/2015  CLINICAL DATA:  Follow-up respiratory failure. EXAM: PORTABLE CHEST 1  VIEW COMPARISON:  09/26/2015 FINDINGS: Endotracheal tube unchanged with tip 2.5 cm above the carina. Nasogastric tube courses through the stomach as tip is not definitely visualized. Lungs are adequately inflated with mild stable prominence of the perihilar markings. Minimal patchy opacification over the lateral right upper lobe and right base without significant change as this may be due to ongoing infection. No evidence of effusion. Stable cardiomegaly. Remainder of the exam is unchanged. IMPRESSION: Subtle patchy density over the lateral right upper lobe and right base which may be due to infection. Stable cardiomegaly with suggestion of mild vascular congestion. Electronically Signed   By: Marin Olp M.D.   On: 09/27/2015 08:06   Dg Chest Port 1 View  09/26/2015  CLINICAL DATA:  Acute respiratory failure, CHF, COPD, sepsis, morbid obesity. EXAM: PORTABLE CHEST 1 VIEW COMPARISON:  Portable chest x-ray of September 25, 2015 FINDINGS: The lungs are reasonably well inflated. The interstitial markings remain coarse bilaterally. There is new atelectasis or early infiltrate at the lung bases. There is no pleural effusion or pneumothorax. The cardiac silhouette is enlarged. The pulmonary vascularity is not engorged. The endotracheal tube tip lies 2.6 cm above the carina. The esophagogastric tube tip projects below the inferior margin of the image. IMPRESSION: Interval development of subsegmental atelectasis at the lung bases. Stable cardiomegaly and bilateral pulmonary interstitial prominence. Electronically Signed   By: David  Martinique M.D.   On: 09/26/2015 07:38   Dg Chest Port 1 View  09/25/2015  CLINICAL DATA:  Respiratory failure. EXAM: PORTABLE CHEST 1 VIEW COMPARISON:  Earlier today at 1701 hours FINDINGS: Endotracheal tube terminates approximately 15 mm above the carina. Nasogastric tube is poorly visualized distally but likely terminates at the distal stomach. Numerous leads and wires project over the chest.  Cardiomegaly accentuated by AP portable technique. No pleural effusion or pneumothorax. Low lung volumes with resultant pulmonary interstitial prominence. No lobar consolidation. Mild pulmonary venous congestion remains. IMPRESSION: Cardiomegaly and low lung volumes with persistent pulmonary venous congestion. Endotracheal tube terminating 1.5 cm above carina. Consider retraction 2-3 cm. Electronically Signed   By: Marylyn Ishihara  Jobe Igo M.D.   On: 09/25/2015 18:41   Dg Chest Port 1 View  09/25/2015  CLINICAL DATA:  Acute respiratory failure. Endotracheal tube placement. EXAM: PORTABLE CHEST 1 VIEW COMPARISON:  09/25/2015 FINDINGS: Endotracheal tube has been placed, tip overlying the level of the lower trachea, 1.6 cm above the carina. Consider withdrawing endotracheal tube 1-2 cm. The heart is enlarged. The left hemidiaphragm is elevated and stable. There is mild right lower lobe atelectasis. There has been some improvement of interstitial edema. Nasogastric tube is in place, tip in the stomach. IMPRESSION: 1. Interval placement of nasogastric tube ; placement of endotracheal tube with tip 1.6 cm above carina. 2. Mild improvement in pulmonary edema. Electronically Signed   By: Nolon Nations M.D.   On: 09/25/2015 17:47   Dg Chest Port 1 View  09/25/2015  CLINICAL DATA:  70 year old female with shortness of breath. EXAM: PORTABLE CHEST 1 VIEW COMPARISON:  09/22/2015 and prior exams FINDINGS: Cardiomegaly and bilateral interstitial opacities are unchanged. Mild bibasilar atelectasis again noted. There is no evidence of pneumothorax. No other interval changes identified. IMPRESSION: Unchanged appearance of the chest with cardiomegaly, probable interstitial pulmonary edema and basilar atelectasis. Electronically Signed   By: Margarette Canada M.D.   On: 09/25/2015 09:00   Dg Chest Port 1 View  09/22/2015  CLINICAL DATA:  Dyspnea. EXAM: PORTABLE CHEST 1 VIEW COMPARISON:  09/14/2015. FINDINGS: Stable enlarged cardiac  silhouette. Mild increase in prominence of the pulmonary vasculature and interstitial markings. No visible pleural fluid. Unremarkable bones. IMPRESSION: Mildly progressive changes of congestive heart failure. Electronically Signed   By: Claudie Revering M.D.   On: 09/22/2015 13:51   Dg Chest Portable 1 View  09/11/2015  CLINICAL DATA:  Shortness of breath 1 day. EXAM: PORTABLE CHEST 1 VIEW COMPARISON:  12/13/2014 and 12/11/2014 FINDINGS: Lordotic technique demonstrated. Subtle stable elevation of the left hemidiaphragm. Lungs are hypoinflated with minimal prominence of the central perihilar markings likely minimal vascular congestion. No definite effusion. No lobar consolidation. Stable moderate cardiomegaly. There is calcified plaque over the aortic arch. Remainder of the exam is unchanged. IMPRESSION: Stable cardiomegaly with findings suggesting mild vascular congestion. Electronically Signed   By: Marin Olp M.D.   On: 09/11/2015 15:26   Dg Tibia/fibula Right Port  09/11/2015  CLINICAL DATA:  Status post fall today with a right lower leg injury and pain. Initial encounter. EXAM: PORTABLE RIGHT TIBIA AND FIBULA - 2 VIEW COMPARISON:  None. FINDINGS: No acute bony or joint abnormality is identified. Degenerative change about the right knee is noted. Scattered atherosclerotic calcifications are seen. IMPRESSION: No acute abnormality. Electronically Signed   By: Inge Rise M.D.   On: 09/11/2015 15:26   Dg Abd Portable 1v  09/25/2015  CLINICAL DATA:  Abdominal distention. EXAM: PORTABLE ABDOMEN - 1 VIEW COMPARISON:  None. FINDINGS: The stomach shows gaseous distention. There is a fairly large amount of fecal material throughout the colon without evidence of significant focal impaction or bowel obstruction. Some gas in nondilated small bowel loops may also be consistent with a mild component of small bowel ileus. No abnormal calcifications or soft tissue abnormalities identified. IMPRESSION: Gaseous  distention of stomach. Diffuse fecal material throughout the colon. Gas in nondilated small bowel loops may be reflective of a mild component of small bowel ileus. Electronically Signed   By: Aletta Edouard M.D.   On: 09/25/2015 11:28     CBC  Recent Labs Lab 09/26/15 0540 09/27/15 0414 09/28/15 0223 09/29/15 0305 09/30/15 [id number]  WBC 17.8* 15.5* 17.0* 14.9* 12.0*  HGB 9.1* 9.1* 9.0* 8.7* 8.4*  HCT 30.7* 31.3* 31.6* 30.8* 29.8*  PLT 180 187 213 222 209  MCV 88.0 89.4 88.5 89.3 89.2  MCH 26.1 26.0 25.2* 25.2* 25.1*  MCHC 29.6* 29.1* 28.5* 28.2* 28.2*  RDW 21.4* 21.2* 21.0* 20.9* 20.8*    Chemistries   Recent Labs Lab 09/26/15 0310 09/27/15 0414 09/28/15 0223 09/29/15 0305 09/30/15 0511  NA 139 141 140 138 141  K 3.7 4.0 3.7 4.1 4.0  CL 92* 94* 94* 94* 95*  CO2 36* 37* 35* 35* 37*  GLUCOSE 121* 130* 120* 122* 109*  BUN 27* 36* 44* 50* 53*  CREATININE 1.09* 0.97 1.10* 1.18* 1.04*  CALCIUM 8.4* 8.8* 8.9 8.8* 8.6*  MG  --  2.4  --   --   --    ------------------------------------------------------------------------------------------------------------------ estimated creatinine clearance is 59.4 mL/min (by C-G formula based on Cr of 1.04). ------------------------------------------------------------------------------------------------------------------ No results for input(s): HGBA1C in the last 72 hours. ------------------------------------------------------------------------------------------------------------------ No results for input(s): CHOL, HDL, LDLCALC, TRIG, CHOLHDL, LDLDIRECT in the last 72 hours. ------------------------------------------------------------------------------------------------------------------ No results for input(s): TSH, T4TOTAL, T3FREE, THYROIDAB in the last 72 hours.  Invalid input(s): FREET3 ------------------------------------------------------------------------------------------------------------------ No results for input(s):  VITAMINB12, FOLATE, FERRITIN, TIBC, IRON, RETICCTPCT in the last 72 hours.  Coagulation profile  Recent Labs Lab 09/25/15 2130  INR 1.13    No results for input(s): DDIMER in the last 72 hours.  Cardiac Enzymes  Recent Labs Lab 09/25/15 1700 09/25/15 2130 09/26/15 0310  TROPONINI 0.11* 0.11* 0.09*   ------------------------------------------------------------------------------------------------------------------ Invalid input(s): POCBNP     Time Spent in minutes   30 minutes   Lauren Modisette M.D on 09/30/2015 at 11:19 AM  Between 7am to 7pm - Pager - [phone]  After 7pm go to [email or web address] - password [facility]  [facility]   Office  [phone]

## 2015-09-30 NOTE — Progress Notes (Signed)
[place] for Xarelto Indication: atrial fibrillation  No Known Allergies  Patient Measurements: Height: 5\' 4"  (162.6 cm) Weight: 225 lb 8.5 oz (102.3 kg) IBW/kg (Calculated) : 54.7  Vital Signs: Temp: 97.7 F (36.5 C) (01/07 0538) Temp Source: Oral (01/07 0538) BP: 125/73 mmHg (01/07 0538) Pulse Rate: 71 (01/07 0538)  Labs:  Recent Labs  09/28/15 0223 09/29/15 0305 09/30/15 0511  HGB 9.0* 8.7* 8.4*  HCT 31.6* 30.8* 29.8*  PLT 213 222 209  CREATININE 1.10* 1.18* 1.04*    Estimated Creatinine Clearance: 59.4 mL/min (by C-G formula based on Cr of 1.04).   Assessment: 70 y.o. female  with h/o Afib, on Lovenox s/p  Evacuation of RLE hematoma, to resume Xarelto   Plan:  D/C Lovenox Xarelto 20 mg daily  Caryl Pina 09/30/2015,7:32 AM

## 2015-10-01 LAB — GLUCOSE, CAPILLARY
GLUCOSE-CAPILLARY: 109 mg/dL — AB (ref 65–99)
GLUCOSE-CAPILLARY: 133 mg/dL — AB (ref 65–99)
Glucose-Capillary: 92 mg/dL (ref 65–99)
Glucose-Capillary: 84 mg/dL (ref 65–99)

## 2015-10-01 LAB — BASIC METABOLIC PANEL
Anion gap: 8 (ref 5–15)
BUN: 44 mg/dL — AB (ref 6–20)
CHLORIDE: 94 mmol/L — AB (ref 101–111)
CO2: 37 mmol/L — ABNORMAL HIGH (ref 22–32)
CREATININE: 0.89 mg/dL (ref 0.44–1.00)
Calcium: 8.3 mg/dL — ABNORMAL LOW (ref 8.9–10.3)
GFR calc Af Amer: >60 mL/min (ref >60)
GFR calc non Af Amer: >60 mL/min (ref >60)
GLUCOSE: 94 mg/dL (ref 65–99)
POTASSIUM: 4.1 mmol/L (ref 3.5–5.1)
SODIUM: 139 mmol/L (ref 135–145)

## 2015-10-01 LAB — CBC
HCT: 33.5 % — ABNORMAL LOW (ref 36.0–46.0)
HEMOGLOBIN: 9.3 g/dL — AB (ref 12.0–15.0)
MCH: 25.1 pg — AB (ref 26.0–34.0)
MCHC: 27.8 g/dL — AB (ref 30.0–36.0)
MCV: 90.5 fL (ref 78.0–100.0)
PLATELETS: 182 10*3/uL (ref 150–400)
RBC: 3.7 MIL/uL — AB (ref 3.87–5.11)
RDW: 20.7 % — ABNORMAL HIGH (ref 11.5–15.5)
WBC: 12.8 10*3/uL — ABNORMAL HIGH (ref 4.0–10.5)

## 2015-10-01 LAB — MAGNESIUM: MAGNESIUM: 2.2 mg/dL (ref 1.7–2.4)

## 2015-10-01 MED ORDER — FLEET ENEMA 7-19 GM/118ML RE ENEM: 1.0000 | ENEMA | Freq: Once | RECTAL | Status: DC

## 2015-10-01 MED ORDER — SORBITOL 70 % SOLN
960.0000 mL | TOPICAL_OIL | Freq: Once | ORAL | Status: AC
  Administered 2015-10-01: 960 mL via RECTAL
  Filled 2015-10-01: qty 240

## 2015-10-01 MED ORDER — BISACODYL 5 MG PO TBEC: 10.0000 mg | DELAYED_RELEASE_TABLET | Freq: Every day | ORAL | Status: DC | PRN

## 2015-10-01 MED ORDER — MAGNESIUM CITRATE PO SOLN
1.0000 | Freq: Once | ORAL | Status: AC
  Administered 2015-10-01: 1 via ORAL
  Filled 2015-10-01: qty 296

## 2015-10-01 NOTE — Progress Notes (Signed)
Patient Demographics  Caitlin Hampton, is a 69 y.o. female, DOB - August 03, 1946, [id number]  Admit date - 09/11/2015   Admitting Physician Annita Brod, MD  Outpatient Primary MD for the patient is [name], MD  LOS - 20   Chief Complaint  Patient presents with  . Shortness of Breath  . Fall       Admission HPI/Brief narrative: Patient is an 70 year old female past oral history of COPD on chronic 3 L oxygen, atrial fibrillation on chronic anticoagulation and CAD presents with fall, and left lower extremity hematoma, acute on chronic respiratory failure secondary to pneumonia and COPD exacerbation, as well had sepsis secondary to UTI and pneumonia. Dr Ninfa Linden was consulted for evacuation of right lower extremity hematoma.went to or 12/26 for I&D, and 12/31 for skin graft . As well patient noted to have acute on chronic respiratory failure secondary to volume overload, patient with acute worsening respiratory failure secondary to COPD exacerbation required intubation 09/25/2015, successfully extubated 09/27/2015.  Subjective:   Caitlin Hampton today has, No headache, No chest pain, No abdominal pain - No Nausea, drains of mild dyspnea, denies any cough or productive sputum , and complaints of constipation,. Assessment & Plan    Principal Problem:   Acute and chronic respiratory failure with hypoxia (HCC) Active Problems:   Hypertension   Hypothyroidism   Acute on chronic diastolic CHF (congestive heart failure) (HCC)   HX: anticoagulation   Morbid obesity (HCC)   COPD exacerbation (HCC)   Sepsis ([place])   Chronic a-fib (HCC)   UTI (urinary tract infection)   Hematoma of right lower extremity   CAP (community acquired pneumonia)   Pressure ulcer  Acute on chronic hypoxic respiratory failure  - Appears to be multifactorial, secondary to acute on chronic diastolic CHF, COPD exacerbation, and  pneumonia . - Patient finished antibiotic treatment for her pneumonia . - On tapering dose steroid for COPD acute exacerbation. - Successfully extubated 09/27/2015. - Try to wean oxygen back to baseline, currently on 5 L, she is  on 3 L at baseline.  Right lower extremity hematoma status post fall - Patient on chronic anticoagulation Xarelto at home. - Seen by Dr. Ninfa Linden, status post I&D 09/18/2015, skin graft 09/23/2015, wound VAC discontinued 09/25/2015 - Followed by orthopedic  CAP - Treated  Acute COPD exacerbation - Continue with the steroid taper, currently on prednisone to 20 mg oral daily, - Continue with nebs as needed - Continue with brovana , Pulmicort, and Spiriva  Acute on chronic diastolic CHF - Cardiac enzymes negative 3. TSH within normal limits. 2-D echo with EF of 55-60% with no wall motion abnormalities. Severely elevated pulmonary pressure. - Remains with overall significant positive balance, will continue with IV Lasix 40 mg twice a day today as well, -2.1 L last 24 hours, will reassess diuresis in a.m.Marland Kitchen  Sepsis - Present on admission, resolved  Acute blood loss anemia - secondary to hematoma. hemoglobin as low as 6.3, required 2 unit transfusion on 12/27 . - Monitor hemoglobin closely,   Hypothyroidism - Continue with Synthroid  Chronic atrial fibrillation - ChadsVasc >2,  on Xarelto at home, cleared by orthopedic to resume anticoagulation, initially on Lovenox, resumed back on  Xarelto.  Constipation - Given SMOG  enema, will give magnesium citrate  Code Status: Full  Family Communication: Discussed with patient  Disposition Plan: We'll need SNF placement, in  24-48 hours after appropriately diuresed and respiratory status improved.   Procedures   Bedside I and D and evacuation of right lower extremity hematoma 09/14/2015  2-D echo 09/14/2015  2 units packed red blood cells 09/19/2015  OR for skin grafting 12/31  Intubated 09/25/2015,  extubated 09/27/2015   Consults  PCCM Orthopedic Dr. Ninfa Linden   Medications  Scheduled Meds: . antiseptic oral rinse  7 mL Mouth Rinse BID  . arformoterol  15 mcg Nebulization BID  . atorvastatin  10 mg Oral QHS  . budesonide  0.5 mg Nebulization BID  . cloNIDine  0.1 mg Oral BID  . diltiazem  180 mg Oral Daily  . feeding supplement (PRO-STAT SUGAR FREE 64)  30 mL Oral BID  . furosemide  40 mg Intravenous BID  . insulin aspart  0-15 Units Subcutaneous TID WC  . insulin aspart  0-5 Units Subcutaneous QHS  . levothyroxine  100 mcg Oral QAC breakfast  . pantoprazole  40 mg Oral Q1200  . polyethylene glycol  17 g Oral Daily  . predniSONE  20 mg Oral Q breakfast  . rivaroxaban  20 mg Oral Q supper  . senna-docusate  2 tablet Oral BID  . sodium chloride  3 mL Intravenous Q12H  . sodium phosphate  1 enema Rectal Once  . tiotropium  18 mcg Inhalation Daily   Continuous Infusions:  PRN Meds:.sodium chloride, acetaminophen (TYLENOL) oral liquid 160 mg/5 mL, albuterol, bisacodyl, bisacodyl, fentaNYL (SUBLIMAZE) injection, [DISCONTINUED] ondansetron **OR** ondansetron (ZOFRAN) IV  DVT Prophylaxis  Lovenox   Lab Results  Component Value Date   PLT 182 10/01/2015    Antibiotics    Anti-infectives    Start     Dose/Rate Route Frequency Ordered Stop   09/23/15 0715  ceFAZolin (ANCEF) 2-3 GM-% IVPB SOLR    Comments:  Marinda Elk   : cabinet override      09/23/15 0715 09/23/15 0736   09/13/15 1800  cefTRIAXone (ROCEPHIN) 1 g in dextrose 5 % 50 mL IVPB     1 g 100 mL/hr over 30 Minutes Intravenous Every 24 hours 09/13/15 0948 09/15/15 1746   09/11/15 1915  cefTRIAXone (ROCEPHIN) 1 g in dextrose 5 % 50 mL IVPB  Status:  Discontinued     1 g 100 mL/hr over 30 Minutes Intravenous  Once 09/11/15 1911 09/11/15 1914   09/11/15 1915  azithromycin (ZITHROMAX) 500 mg in dextrose 5 % 250 mL IVPB  Status:  Discontinued     500 mg 250 mL/hr over 60 Minutes Intravenous  Once 09/11/15 1911  09/11/15 1914   09/11/15 1915  azithromycin (ZITHROMAX) 500 mg in dextrose 5 % 250 mL IVPB  Status:  Discontinued     500 mg 250 mL/hr over 60 Minutes Intravenous Every 24 hours 09/11/15 1914 09/13/15 0948   09/11/15 1915  cefTRIAXone (ROCEPHIN) 2 g in dextrose 5 % 50 mL IVPB  Status:  Discontinued     2 g 100 mL/hr over 30 Minutes Intravenous Every 24 hours 09/11/15 1914 09/13/15 0948          Objective:   Filed Vitals:   09/30/15 0801 09/30/15 2026 09/30/15 2221 10/01/15 0605  BP:   127/66 125/66  Pulse:  78 75 65  Temp:   97.7 F (36.5 C) 97.5 F (36.4 C)  TempSrc:   Oral Oral  Resp:  18 20 18   Height:      Weight:    98.2 kg (216 lb 7.9 oz)  SpO2: 96%  99% 97%    Wt Readings from Last 3 Encounters:  10/01/15 98.2 kg (216 lb 7.9 oz)  08/25/15 110.224 kg (243 lb)  02/06/15 105.416 kg (232 lb 6.4 oz)     Intake/Output Summary (Last 24 hours) at 10/01/15 1118 Last data filed at 10/01/15 1053  Gross per 24 hour  Intake      0 ml  Output   3200 ml  Net  -3200 ml     Physical Exam  Awake Alert, Oriented X 3, No new F.N deficits, Normal affect [place].AT,PERRAL Supple Neck,No JVD, No cervical lymphadenopathy appriciated.  Symmetrical Chest wall movement,bibasilar crackles, mild scattered wheezing . No Gallops,Rubs or new Murmurs, No Parasternal Heave +ve B.Sounds, Abd Soft, No tenderness, No organomegaly appriciated, No rebound - guarding or rigidity. No Cyanosis,right lower extremity wrapped with Ace wrap,   Data Review   Micro Results No results found for this or any previous visit (from the past 240 hour(s)).  Radiology Reports Dg Chest 2 View  09/14/2015  CLINICAL DATA:  Shortness of Breath EXAM: CHEST  2 VIEW COMPARISON:  September 11, 2015 FINDINGS: There is patchy atelectasis in each lung base. There is mild interstitial edema with cardiomegaly and borderline pulmonary venous hypertension. No adenopathy. There is atherosclerotic calcification in the aorta.  There is degenerative change in the thoracic spine. There is a small right pleural effusion. IMPRESSION: Findings indicative of a degree of congestive heart failure. Electronically Signed   By: Lowella Grip III M.D.   On: 09/14/2015 09:59   Dg Chest Port 1 View  09/27/2015  CLINICAL DATA:  Follow-up respiratory failure. EXAM: PORTABLE CHEST 1 VIEW COMPARISON:  09/26/2015 FINDINGS: Endotracheal tube unchanged with tip 2.5 cm above the carina. Nasogastric tube courses through the stomach as tip is not definitely visualized. Lungs are adequately inflated with mild stable prominence of the perihilar markings. Minimal patchy opacification over the lateral right upper lobe and right base without significant change as this may be due to ongoing infection. No evidence of effusion. Stable cardiomegaly. Remainder of the exam is unchanged. IMPRESSION: Subtle patchy density over the lateral right upper lobe and right base which may be due to infection. Stable cardiomegaly with suggestion of mild vascular congestion. Electronically Signed   By: Marin Olp M.D.   On: 09/27/2015 08:06   Dg Chest Port 1 View  09/26/2015  CLINICAL DATA:  Acute respiratory failure, CHF, COPD, sepsis, morbid obesity. EXAM: PORTABLE CHEST 1 VIEW COMPARISON:  Portable chest x-ray of September 25, 2015 FINDINGS: The lungs are reasonably well inflated. The interstitial markings remain coarse bilaterally. There is new atelectasis or early infiltrate at the lung bases. There is no pleural effusion or pneumothorax. The cardiac silhouette is enlarged. The pulmonary vascularity is not engorged. The endotracheal tube tip lies 2.6 cm above the carina. The esophagogastric tube tip projects below the inferior margin of the image. IMPRESSION: Interval development of subsegmental atelectasis at the lung bases. Stable cardiomegaly and bilateral pulmonary interstitial prominence. Electronically Signed   By: David  Martinique M.D.   On: 09/26/2015 07:38   Dg  Chest Port 1 View  09/25/2015  CLINICAL DATA:  Respiratory failure. EXAM: PORTABLE CHEST 1 VIEW COMPARISON:  Earlier today at 1701 hours FINDINGS: Endotracheal tube terminates approximately 15 mm above the carina. Nasogastric tube is poorly visualized distally but likely terminates at the  distal stomach. Numerous leads and wires project over the chest. Cardiomegaly accentuated by AP portable technique. No pleural effusion or pneumothorax. Low lung volumes with resultant pulmonary interstitial prominence. No lobar consolidation. Mild pulmonary venous congestion remains. IMPRESSION: Cardiomegaly and low lung volumes with persistent pulmonary venous congestion. Endotracheal tube terminating 1.5 cm above carina. Consider retraction 2-3 cm. Electronically Signed   By: Abigail Miyamoto M.D.   On: 09/25/2015 18:41   Dg Chest Port 1 View  09/25/2015  CLINICAL DATA:  Acute respiratory failure. Endotracheal tube placement. EXAM: PORTABLE CHEST 1 VIEW COMPARISON:  09/25/2015 FINDINGS: Endotracheal tube has been placed, tip overlying the level of the lower trachea, 1.6 cm above the carina. Consider withdrawing endotracheal tube 1-2 cm. The heart is enlarged. The left hemidiaphragm is elevated and stable. There is mild right lower lobe atelectasis. There has been some improvement of interstitial edema. Nasogastric tube is in place, tip in the stomach. IMPRESSION: 1. Interval placement of nasogastric tube ; placement of endotracheal tube with tip 1.6 cm above carina. 2. Mild improvement in pulmonary edema. Electronically Signed   By: Nolon Nations M.D.   On: 09/25/2015 17:47   Dg Chest Port 1 View  09/25/2015  CLINICAL DATA:  70 year old female with shortness of breath. EXAM: PORTABLE CHEST 1 VIEW COMPARISON:  09/22/2015 and prior exams FINDINGS: Cardiomegaly and bilateral interstitial opacities are unchanged. Mild bibasilar atelectasis again noted. There is no evidence of pneumothorax. No other interval changes identified.  IMPRESSION: Unchanged appearance of the chest with cardiomegaly, probable interstitial pulmonary edema and basilar atelectasis. Electronically Signed   By: Margarette Canada M.D.   On: 09/25/2015 09:00   Dg Chest Port 1 View  09/22/2015  CLINICAL DATA:  Dyspnea. EXAM: PORTABLE CHEST 1 VIEW COMPARISON:  09/14/2015. FINDINGS: Stable enlarged cardiac silhouette. Mild increase in prominence of the pulmonary vasculature and interstitial markings. No visible pleural fluid. Unremarkable bones. IMPRESSION: Mildly progressive changes of congestive heart failure. Electronically Signed   By: Claudie Revering M.D.   On: 09/22/2015 13:51   Dg Chest Portable 1 View  09/11/2015  CLINICAL DATA:  Shortness of breath 1 day. EXAM: PORTABLE CHEST 1 VIEW COMPARISON:  12/13/2014 and 12/11/2014 FINDINGS: Lordotic technique demonstrated. Subtle stable elevation of the left hemidiaphragm. Lungs are hypoinflated with minimal prominence of the central perihilar markings likely minimal vascular congestion. No definite effusion. No lobar consolidation. Stable moderate cardiomegaly. There is calcified plaque over the aortic arch. Remainder of the exam is unchanged. IMPRESSION: Stable cardiomegaly with findings suggesting mild vascular congestion. Electronically Signed   By: Marin Olp M.D.   On: 09/11/2015 15:26   Dg Tibia/fibula Right Port  09/11/2015  CLINICAL DATA:  Status post fall today with a right lower leg injury and pain. Initial encounter. EXAM: PORTABLE RIGHT TIBIA AND FIBULA - 2 VIEW COMPARISON:  None. FINDINGS: No acute bony or joint abnormality is identified. Degenerative change about the right knee is noted. Scattered atherosclerotic calcifications are seen. IMPRESSION: No acute abnormality. Electronically Signed   By: Inge Rise M.D.   On: 09/11/2015 15:26   Dg Abd Portable 1v  09/25/2015  CLINICAL DATA:  Abdominal distention. EXAM: PORTABLE ABDOMEN - 1 VIEW COMPARISON:  None. FINDINGS: The stomach shows gaseous  distention. There is a fairly large amount of fecal material throughout the colon without evidence of significant focal impaction or bowel obstruction. Some gas in nondilated small bowel loops may also be consistent with a mild component of small bowel ileus. No abnormal calcifications or  soft tissue abnormalities identified. IMPRESSION: Gaseous distention of stomach. Diffuse fecal material throughout the colon. Gas in nondilated small bowel loops may be reflective of a mild component of small bowel ileus. Electronically Signed   By: Aletta Edouard M.D.   On: 09/25/2015 11:28     CBC  Recent Labs Lab 09/27/15 0414 09/28/15 0223 09/29/15 0305 09/30/15 0511 10/01/15 0230  WBC 15.5* 17.0* 14.9* 12.0* 12.8*  HGB 9.1* 9.0* 8.7* 8.4* 9.3*  HCT 31.3* 31.6* 30.8* 29.8* 33.5*  PLT 187 213 222 209 182  MCV 89.4 88.5 89.3 89.2 90.5  MCH 26.0 25.2* 25.2* 25.1* 25.1*  MCHC 29.1* 28.5* 28.2* 28.2* 27.8*  RDW 21.2* 21.0* 20.9* 20.8* 20.7*    Chemistries   Recent Labs Lab 09/27/15 0414 09/28/15 0223 09/29/15 0305 09/30/15 0511 10/01/15 0230  NA 141 140 138 141 139  K 4.0 3.7 4.1 4.0 4.1  CL 94* 94* 94* 95* 94*  CO2 37* 35* 35* 37* 37*  GLUCOSE 130* 120* 122* 109* 94  BUN 36* 44* 50* 53* 44*  CREATININE 0.97 1.10* 1.18* 1.04* 0.89  CALCIUM 8.8* 8.9 8.8* 8.6* 8.3*  MG 2.4  --   --   --  2.2   ------------------------------------------------------------------------------------------------------------------ estimated creatinine clearance is 67.9 mL/min (by C-G formula based on Cr of 0.89). ------------------------------------------------------------------------------------------------------------------ No results for input(s): HGBA1C in the last 72 hours. ------------------------------------------------------------------------------------------------------------------ No results for input(s): CHOL, HDL, LDLCALC, TRIG, CHOLHDL, LDLDIRECT in the last 72  hours. ------------------------------------------------------------------------------------------------------------------ No results for input(s): TSH, T4TOTAL, T3FREE, THYROIDAB in the last 72 hours.  Invalid input(s): FREET3 ------------------------------------------------------------------------------------------------------------------ No results for input(s): VITAMINB12, FOLATE, FERRITIN, TIBC, IRON, RETICCTPCT in the last 72 hours.  Coagulation profile  Recent Labs Lab 09/25/15 2130  INR 1.13    No results for input(s): DDIMER in the last 72 hours.  Cardiac Enzymes  Recent Labs Lab 09/25/15 1700 09/25/15 2130 09/26/15 0310  TROPONINI 0.11* 0.11* 0.09*   ------------------------------------------------------------------------------------------------------------------ Invalid input(s): POCBNP     Time Spent in minutes   25 minutes   Carlisle Enke M.D on 10/01/2015 at 11:18 AM  Between 7am to 7pm - Pager - [phone]  After 7pm go to [email or web address] - password [facility]  [facility]   Office  [phone]

## 2015-10-02 LAB — BASIC METABOLIC PANEL
ANION GAP: 7 (ref 5–15)
BUN: 35 mg/dL — ABNORMAL HIGH (ref 6–20)
CALCIUM: 8.3 mg/dL — AB (ref 8.9–10.3)
CO2: 44 mmol/L — ABNORMAL HIGH (ref 22–32)
Chloride: 90 mmol/L — ABNORMAL LOW (ref 101–111)
Creatinine, Ser: 0.93 mg/dL (ref 0.44–1.00)
Glucose, Bld: 104 mg/dL — ABNORMAL HIGH (ref 65–99)
POTASSIUM: 3.8 mmol/L (ref 3.5–5.1)
Sodium: 141 mmol/L (ref 135–145)

## 2015-10-02 LAB — GLUCOSE, CAPILLARY
GLUCOSE-CAPILLARY: 81 mg/dL (ref 65–99)
GLUCOSE-CAPILLARY: 116 mg/dL — AB (ref 65–99)
Glucose-Capillary: 123 mg/dL — ABNORMAL HIGH (ref 65–99)
Glucose-Capillary: 84 mg/dL (ref 65–99)

## 2015-10-02 LAB — CBC
HCT: 33 % — ABNORMAL LOW (ref 36.0–46.0)
Hemoglobin: 9.1 g/dL — ABNORMAL LOW (ref 12.0–15.0)
MCH: 24.9 pg — ABNORMAL LOW (ref 26.0–34.0)
MCHC: 27.6 g/dL — ABNORMAL LOW (ref 30.0–36.0)
MCV: 90.4 fL (ref 78.0–100.0)
PLATELETS: 226 10*3/uL (ref 150–400)
RBC: 3.65 MIL/uL — ABNORMAL LOW (ref 3.87–5.11)
RDW: 20.8 % — AB (ref 11.5–15.5)
WBC: 11 10*3/uL — AB (ref 4.0–10.5)

## 2015-10-02 MED ORDER — WHITE PETROLATUM GEL
Status: AC
  Administered 2015-10-02: 11:00:00
  Filled 2015-10-02: qty 1

## 2015-10-02 MED ORDER — PREDNISONE 10 MG PO TABS
10.0000 mg | ORAL_TABLET | Freq: Every day | ORAL | Status: DC
  Administered 2015-10-03: 10 mg via ORAL
  Filled 2015-10-02: qty 1

## 2015-10-02 NOTE — Care Management Important Message (Signed)
Important Message  Patient Details  Name: Caitlin Hampton MRN: [id number] Date of Birth: Jan 26, 1946   Medicare Important Message Given:  Yes    Louanne Belton 10/02/2015, 1:00 [place] Message  Patient Details  Name: Caitlin Hampton MRN: [id number] Date of Birth: 1945/10/05   Medicare Important Message Given:  Yes    Coutney Wildermuth, [place] 10/02/2015, 1:00 PM

## 2015-10-02 NOTE — Care Management Note (Signed)
Case Management Note Previous CM note initiated by Donne Anon RN, CM  Patient Details  Name: Caitlin Hampton MRN: [id number] Date of Birth: Dec 12, 1945  Subjective/Objective:    Adm w sepsis                Action/Plan: lives w sister, has home o2 w ahc   Expected Discharge Date:                  Expected Discharge Plan:  [place]  In-House Referral:  Clinical Social Work  Discharge planning Services  CM Consult  Post Acute Care Choice:    Choice offered to:     DME Arranged:    DME Agency:     HH Arranged:    [place] Agency:     Status of Service:  In process, will continue to follow  Medicare Important Message Given:  Yes Date Medicare IM Given:    Medicare IM give by:    Date Additional Medicare IM Given:    Additional Medicare Important Message give by:     If discussed at [place] of Stay Meetings, dates discussed:  10/03/15  Additional Comments: ur review done  10/02/15- [name]  RN, BSN-  D/c planning for SNF- CSW following for placement needs. Possible ready for d/c 10/03/15  Dawayne Patricia, RN 10/02/2015, 11:04 AM

## 2015-10-02 NOTE — Progress Notes (Signed)
Patient ID: Caitlin Hampton, female   DOB: 27-Jul-1946, 70 y.o.   MRN: [id number] Skin graft on right leg looks good with really good take of the graft.  Donor site doing well.  At this point, just needs dry dressing daily on both her right thigh and her right leg.

## 2015-10-02 NOTE — Progress Notes (Signed)
Physical Therapy Treatment Patient Details Name: Caitlin Hampton MRN: [id number] DOB: [birth year] Today's Date: 10/02/2015    History of Present Illness Patient is an 70 year old female past history of COPD on chronic 3 L oxygen, atrial fibrillation on chronic anticoagulation and CAD who presented to ED with increased dyspnea on exertion and nonproductive cough, and s/p fall hitting her left leg which led to a large hematoma. Patient found to have acute respiratory failure requiring 5-6 L of oxygen.  Dr. Ninfa Linden took to surgery on 09/18/15 for I&D of Right leg wound and VAC placement.  Scheduled for another surgery 09/23/15 for a repeat I&D of the right leg and placement of a split-thickness skin graft.  VAC discontinued 1/2 and successfully extubated 1/4.    PT Comments    Patient reporting that she is feeling tired today. Heavy encouragement needed throughout session for patient to participate in PT session. Patient not willing to attempt standing today, reports feeling too tired. Able to sit  edge of bed 5-10 minutes with encouragement. Continue to recommend SNF upon D/C, PT to follow and progress as tolerated.    Follow Up Recommendations  SNF;Supervision/Assistance - 24 hour     Equipment Recommendations  None recommended by PT    Recommendations for Other Services       Precautions / Restrictions Precautions Precautions: Fall Precaution Comments: Oxygen dependent Restrictions Weight Bearing Restrictions: No    Mobility  Bed Mobility Overal bed mobility: Needs Assistance Bed Mobility: Supine to Sit;Rolling;Sit to Supine Rolling: +2 for physical assistance;Max assist   Supine to sit: +2 for physical assistance;Max assist Sit to supine: +2 for physical assistance;Max assist   General bed mobility comments: repeated verbal and physical cues to stay upright while sitting. Patient leaning to left throughout session.   Transfers                 General transfer comment:  patient unwilling to attempt  Ambulation/Gait                 Stairs            Wheelchair Mobility    Modified Rankin (Stroke Patients Only)       Balance Overall balance assessment: Needs assistance Sitting-balance support: Single extremity supported Sitting balance-Leahy Scale: Poor                              Cognition Arousal/Alertness: Awake/alert Behavior During Therapy: Anxious Overall Cognitive Status: Within Functional Limits for tasks assessed                      Exercises      General Comments        Pertinent Vitals/Pain Pain Assessment: No/denies pain (states no pain while at rest)    Home Living                      Prior Function            PT Goals (current goals can now be found in the care plan section) Acute Rehab PT Goals Patient Stated Goal: get and stay in bed PT Goal Formulation: With patient Time For Goal Achievement: 10/11/15 Potential to Achieve Goals: Good Progress towards PT goals: Progressing toward goals    Frequency  Min 3X/week    PT Plan Current plan remains appropriate    Co-evaluation  End of Session Equipment Utilized During Treatment: Oxygen Activity Tolerance: Patient limited by fatigue Patient left: in bed;with call bell/phone within reach     Time: 1023-1041 PT Time Calculation (min) (ACUTE ONLY): 18 min  Charges:  $Therapeutic Activity: 8-22 mins                    G Codes:      Cassell Clement, PT, CSCS Pager [phone] Office [phone]  10/02/2015, 12:33 PM

## 2015-10-02 NOTE — Progress Notes (Signed)
Utilization review completed.  

## 2015-10-02 NOTE — Progress Notes (Signed)
Patient Demographics  Caitlin Hampton, is a 70 y.o. female, DOB - 09-03-1946, [id number]  Admit date - 09/11/2015   Admitting Physician Annita Brod, MD  Outpatient Primary MD for the patient is [name], MD  LOS - 21   Chief Complaint  Patient presents with  . Shortness of Breath  . Fall       Admission HPI/Brief narrative: Patient is an 70 year old female past oral history of COPD on chronic 3 L oxygen, atrial fibrillation on chronic anticoagulation and CAD presents with fall, and left lower extremity hematoma, acute on chronic respiratory failure secondary to pneumonia and COPD exacerbation, as well had sepsis secondary to UTI and pneumonia. Dr Ninfa Linden was consulted for evacuation of right lower extremity hematoma.went to or 12/26 for I&D, and 12/31 for skin graft . As well patient noted to have acute on chronic respiratory failure secondary to volume overload, patient with acute worsening respiratory failure secondary to COPD exacerbation required intubation 09/25/2015, successfully extubated 09/27/2015.  Subjective:   Caitlin Hampton today has, No headache, No chest pain, No abdominal pain - No Nausea, drains of mild dyspnea, denies any cough or productive sputum , constipation has resolved. Assessment & Plan    Principal Problem:   Acute and chronic respiratory failure with hypoxia (HCC) Active Problems:   Hypertension   Hypothyroidism   Acute on chronic diastolic CHF (congestive heart failure) (HCC)   HX: anticoagulation   Morbid obesity (HCC)   COPD exacerbation (HCC)   Sepsis ([place])   Chronic a-fib (HCC)   UTI (urinary tract infection)   Hematoma of right lower extremity   CAP (community acquired pneumonia)   Pressure ulcer  Acute on chronic hypoxic respiratory failure  - Appears to be multifactorial, secondary to acute on chronic diastolic CHF, COPD exacerbation, and  pneumonia . - Patient finished antibiotic treatment for her pneumonia . - On tapering dose steroid for COPD acute exacerbation. - Successfully extubated 09/27/2015. - Try to wean oxygen back to baseline, currently on 4 L, she is  on 3 L at baseline.  Right lower extremity hematoma status post fall - Patient on chronic anticoagulation Xarelto at home. - Seen by Dr. Ninfa Linden, status post I&D 09/18/2015, skin graft 09/23/2015, wound VAC discontinued 09/25/2015 - Followed by orthopedic Dr. Ninfa Linden.  CAP - Treated  Acute COPD exacerbation - Continue with the steroid taper, currently on prednisone to 20 mg oral daily, will decrease to 10 mg oral daily. - Continue with nebs as needed - Continue with brovana , Pulmicort, and Spiriva  Acute on chronic diastolic CHF - Cardiac enzymes negative 3. TSH within normal limits. 2-D echo with EF of 55-60% with no wall motion abnormalities. Severely elevated pulmonary pressure. - Remains with positive balance +2.6 L since admission, but volume status is improving, we'll continue with IV diuresis today, hopefully tomorrow can transition to by mouth Lasix on discharge.   Sepsis - Present on admission, resolved  Acute blood loss anemia - secondary to hematoma. hemoglobin as low as 6.3, required 2 unit transfusion on 12/27 . - Monitor hemoglobin closely,   Hypothyroidism - Continue with Synthroid  Chronic atrial fibrillation - ChadsVasc >2,  on Xarelto at home, cleared by orthopedic to resume anticoagulation, initially on Lovenox, resumed  back on  Xarelto.  Constipation - Resolved  Code Status: Full  Family Communication: Discussed with patient  Disposition Plan: For SNF placment in am.  Procedures   Bedside I & D  evacuation of right lower extremity hematoma 09/14/2015  2-D echo 09/14/2015  2 units packed red blood cells 09/19/2015   skin grafting 12/31  Intubated 09/25/2015, extubated 09/27/2015   Consults  PCCM Orthopedic Dr.  Ninfa Linden   Medications  Scheduled Meds: . antiseptic oral rinse  7 mL Mouth Rinse BID  . arformoterol  15 mcg Nebulization BID  . atorvastatin  10 mg Oral QHS  . budesonide  0.5 mg Nebulization BID  . cloNIDine  0.1 mg Oral BID  . diltiazem  180 mg Oral Daily  . feeding supplement (PRO-STAT SUGAR FREE 64)  30 mL Oral BID  . furosemide  40 mg Intravenous BID  . insulin aspart  0-15 Units Subcutaneous TID WC  . insulin aspart  0-5 Units Subcutaneous QHS  . levothyroxine  100 mcg Oral QAC breakfast  . pantoprazole  40 mg Oral Q1200  . polyethylene glycol  17 g Oral Daily  . predniSONE  20 mg Oral Q breakfast  . rivaroxaban  20 mg Oral Q supper  . senna-docusate  2 tablet Oral BID  . sodium chloride  3 mL Intravenous Q12H  . sodium phosphate  1 enema Rectal Once  . tiotropium  18 mcg Inhalation Daily   Continuous Infusions:  PRN Meds:.sodium chloride, acetaminophen (TYLENOL) oral liquid 160 mg/5 mL, albuterol, bisacodyl, bisacodyl, fentaNYL (SUBLIMAZE) injection, [DISCONTINUED] ondansetron **OR** ondansetron (ZOFRAN) IV  DVT Prophylaxis  Lovenox   Lab Results  Component Value Date   PLT 226 10/02/2015    Antibiotics    Anti-infectives    Start     Dose/Rate Route Frequency Ordered Stop   09/23/15 0715  ceFAZolin (ANCEF) 2-3 GM-% IVPB SOLR    Comments:  Marinda Elk   : cabinet override      09/23/15 0715 09/23/15 0736   09/13/15 1800  cefTRIAXone (ROCEPHIN) 1 g in dextrose 5 % 50 mL IVPB     1 g 100 mL/hr over 30 Minutes Intravenous Every 24 hours 09/13/15 0948 09/15/15 1746   09/11/15 1915  cefTRIAXone (ROCEPHIN) 1 g in dextrose 5 % 50 mL IVPB  Status:  Discontinued     1 g 100 mL/hr over 30 Minutes Intravenous  Once 09/11/15 1911 09/11/15 1914   09/11/15 1915  azithromycin (ZITHROMAX) 500 mg in dextrose 5 % 250 mL IVPB  Status:  Discontinued     500 mg 250 mL/hr over 60 Minutes Intravenous  Once 09/11/15 1911 09/11/15 1914   09/11/15 1915  azithromycin (ZITHROMAX)  500 mg in dextrose 5 % 250 mL IVPB  Status:  Discontinued     500 mg 250 mL/hr over 60 Minutes Intravenous Every 24 hours 09/11/15 1914 09/13/15 0948   09/11/15 1915  cefTRIAXone (ROCEPHIN) 2 g in dextrose 5 % 50 mL IVPB  Status:  Discontinued     2 g 100 mL/hr over 30 Minutes Intravenous Every 24 hours 09/11/15 1914 09/13/15 0948          Objective:   Filed Vitals:   10/01/15 2019 10/01/15 2048 10/02/15 0539 10/02/15 0931  BP:  120/60 118/55   Pulse:  72 77   Temp:  97.8 F (36.6 C) 97.4 F (36.3 C)   TempSrc:  Oral Oral   Resp:  18 18   Height:  Weight:   95 kg (209 lb 7 oz)   SpO2: 96% 91% 97% 92%    Wt Readings from Last 3 Encounters:  10/02/15 95 kg (209 lb 7 oz)  08/25/15 110.224 kg (243 lb)  02/06/15 105.416 kg (232 lb 6.4 oz)     Intake/Output Summary (Last 24 hours) at 10/02/15 1041 Last data filed at 10/02/15 0736  Gross per 24 hour  Intake    600 ml  Output   2600 ml  Net  -2000 ml     Physical Exam  Awake Alert, Oriented X 3, No new F.N deficits, Normal affect [place].AT,PERRAL Supple Neck,No JVD, No cervical lymphadenopathy appriciated.  Symmetrical Chest wall movement,bibasilar crackles, mild scattered wheezing . No Gallops,Rubs or new Murmurs, No Parasternal Heave +ve B.Sounds, Abd Soft, No tenderness, No organomegaly appriciated, No rebound - guarding or rigidity. No Cyanosis,right lower extremity wrapped with Ace wrap,   Data Review   Micro Results No results found for this or any previous visit (from the past 240 hour(s)).  Radiology Reports Dg Chest 2 View  09/14/2015  CLINICAL DATA:  Shortness of Breath EXAM: CHEST  2 VIEW COMPARISON:  September 11, 2015 FINDINGS: There is patchy atelectasis in each lung base. There is mild interstitial edema with cardiomegaly and borderline pulmonary venous hypertension. No adenopathy. There is atherosclerotic calcification in the aorta. There is degenerative change in the thoracic spine. There is a  small right pleural effusion. IMPRESSION: Findings indicative of a degree of congestive heart failure. Electronically Signed   By: Lowella Grip III M.D.   On: 09/14/2015 09:59   Dg Chest Port 1 View  09/27/2015  CLINICAL DATA:  Follow-up respiratory failure. EXAM: PORTABLE CHEST 1 VIEW COMPARISON:  09/26/2015 FINDINGS: Endotracheal tube unchanged with tip 2.5 cm above the carina. Nasogastric tube courses through the stomach as tip is not definitely visualized. Lungs are adequately inflated with mild stable prominence of the perihilar markings. Minimal patchy opacification over the lateral right upper lobe and right base without significant change as this may be due to ongoing infection. No evidence of effusion. Stable cardiomegaly. Remainder of the exam is unchanged. IMPRESSION: Subtle patchy density over the lateral right upper lobe and right base which may be due to infection. Stable cardiomegaly with suggestion of mild vascular congestion. Electronically Signed   By: Marin Olp M.D.   On: 09/27/2015 08:06   Dg Chest Port 1 View  09/26/2015  CLINICAL DATA:  Acute respiratory failure, CHF, COPD, sepsis, morbid obesity. EXAM: PORTABLE CHEST 1 VIEW COMPARISON:  Portable chest x-ray of September 25, 2015 FINDINGS: The lungs are reasonably well inflated. The interstitial markings remain coarse bilaterally. There is new atelectasis or early infiltrate at the lung bases. There is no pleural effusion or pneumothorax. The cardiac silhouette is enlarged. The pulmonary vascularity is not engorged. The endotracheal tube tip lies 2.6 cm above the carina. The esophagogastric tube tip projects below the inferior margin of the image. IMPRESSION: Interval development of subsegmental atelectasis at the lung bases. Stable cardiomegaly and bilateral pulmonary interstitial prominence. Electronically Signed   By: David  Martinique M.D.   On: 09/26/2015 07:38   Dg Chest Port 1 View  09/25/2015  CLINICAL DATA:  Respiratory  failure. EXAM: PORTABLE CHEST 1 VIEW COMPARISON:  Earlier today at 1701 hours FINDINGS: Endotracheal tube terminates approximately 15 mm above the carina. Nasogastric tube is poorly visualized distally but likely terminates at the distal stomach. Numerous leads and wires project over the chest. Cardiomegaly accentuated  by AP portable technique. No pleural effusion or pneumothorax. Low lung volumes with resultant pulmonary interstitial prominence. No lobar consolidation. Mild pulmonary venous congestion remains. IMPRESSION: Cardiomegaly and low lung volumes with persistent pulmonary venous congestion. Endotracheal tube terminating 1.5 cm above carina. Consider retraction 2-3 cm. Electronically Signed   By: Abigail Miyamoto M.D.   On: 09/25/2015 18:41   Dg Chest Port 1 View  09/25/2015  CLINICAL DATA:  Acute respiratory failure. Endotracheal tube placement. EXAM: PORTABLE CHEST 1 VIEW COMPARISON:  09/25/2015 FINDINGS: Endotracheal tube has been placed, tip overlying the level of the lower trachea, 1.6 cm above the carina. Consider withdrawing endotracheal tube 1-2 cm. The heart is enlarged. The left hemidiaphragm is elevated and stable. There is mild right lower lobe atelectasis. There has been some improvement of interstitial edema. Nasogastric tube is in place, tip in the stomach. IMPRESSION: 1. Interval placement of nasogastric tube ; placement of endotracheal tube with tip 1.6 cm above carina. 2. Mild improvement in pulmonary edema. Electronically Signed   By: Nolon Nations M.D.   On: 09/25/2015 17:47   Dg Chest Port 1 View  09/25/2015  CLINICAL DATA:  69 year old female with shortness of breath. EXAM: PORTABLE CHEST 1 VIEW COMPARISON:  09/22/2015 and prior exams FINDINGS: Cardiomegaly and bilateral interstitial opacities are unchanged. Mild bibasilar atelectasis again noted. There is no evidence of pneumothorax. No other interval changes identified. IMPRESSION: Unchanged appearance of the chest with  cardiomegaly, probable interstitial pulmonary edema and basilar atelectasis. Electronically Signed   By: Margarette Canada M.D.   On: 09/25/2015 09:00   Dg Chest Port 1 View  09/22/2015  CLINICAL DATA:  Dyspnea. EXAM: PORTABLE CHEST 1 VIEW COMPARISON:  09/14/2015. FINDINGS: Stable enlarged cardiac silhouette. Mild increase in prominence of the pulmonary vasculature and interstitial markings. No visible pleural fluid. Unremarkable bones. IMPRESSION: Mildly progressive changes of congestive heart failure. Electronically Signed   By: Claudie Revering M.D.   On: 09/22/2015 13:51   Dg Chest Portable 1 View  09/11/2015  CLINICAL DATA:  Shortness of breath 1 day. EXAM: PORTABLE CHEST 1 VIEW COMPARISON:  12/13/2014 and 12/11/2014 FINDINGS: Lordotic technique demonstrated. Subtle stable elevation of the left hemidiaphragm. Lungs are hypoinflated with minimal prominence of the central perihilar markings likely minimal vascular congestion. No definite effusion. No lobar consolidation. Stable moderate cardiomegaly. There is calcified plaque over the aortic arch. Remainder of the exam is unchanged. IMPRESSION: Stable cardiomegaly with findings suggesting mild vascular congestion. Electronically Signed   By: Marin Olp M.D.   On: 09/11/2015 15:26   Dg Tibia/fibula Right Port  09/11/2015  CLINICAL DATA:  Status post fall today with a right lower leg injury and pain. Initial encounter. EXAM: PORTABLE RIGHT TIBIA AND FIBULA - 2 VIEW COMPARISON:  None. FINDINGS: No acute bony or joint abnormality is identified. Degenerative change about the right knee is noted. Scattered atherosclerotic calcifications are seen. IMPRESSION: No acute abnormality. Electronically Signed   By: Inge Rise M.D.   On: 09/11/2015 15:26   Dg Abd Portable 1v  09/25/2015  CLINICAL DATA:  Abdominal distention. EXAM: PORTABLE ABDOMEN - 1 VIEW COMPARISON:  None. FINDINGS: The stomach shows gaseous distention. There is a fairly large amount of fecal  material throughout the colon without evidence of significant focal impaction or bowel obstruction. Some gas in nondilated small bowel loops may also be consistent with a mild component of small bowel ileus. No abnormal calcifications or soft tissue abnormalities identified. IMPRESSION: Gaseous distention of stomach. Diffuse fecal material  throughout the colon. Gas in nondilated small bowel loops may be reflective of a mild component of small bowel ileus. Electronically Signed   By: Aletta Edouard M.D.   On: 09/25/2015 11:28     CBC  Recent Labs Lab 09/28/15 0223 09/29/15 0305 09/30/15 0511 10/01/15 0230 10/02/15 0410  WBC 17.0* 14.9* 12.0* 12.8* 11.0*  HGB 9.0* 8.7* 8.4* 9.3* 9.1*  HCT 31.6* 30.8* 29.8* 33.5* 33.0*  PLT 213 222 209 182 226  MCV 88.5 89.3 89.2 90.5 90.4  MCH 25.2* 25.2* 25.1* 25.1* 24.9*  MCHC 28.5* 28.2* 28.2* 27.8* 27.6*  RDW 21.0* 20.9* 20.8* 20.7* 20.8*    Chemistries   Recent Labs Lab 09/27/15 0414 09/28/15 0223 09/29/15 0305 09/30/15 0511 10/01/15 0230 10/02/15 0410  NA 141 140 138 141 139 141  K 4.0 3.7 4.1 4.0 4.1 3.8  CL 94* 94* 94* 95* 94* 90*  CO2 37* 35* 35* 37* 37* 44*  GLUCOSE 130* 120* 122* 109* 94 104*  BUN 36* 44* 50* 53* 44* 35*  CREATININE 0.97 1.10* 1.18* 1.04* 0.89 0.93  CALCIUM 8.8* 8.9 8.8* 8.6* 8.3* 8.3*  MG 2.4  --   --   --  2.2  --    ------------------------------------------------------------------------------------------------------------------ estimated creatinine clearance is 63.8 mL/min (by C-G formula based on Cr of 0.93). ------------------------------------------------------------------------------------------------------------------ No results for input(s): HGBA1C in the last 72 hours. ------------------------------------------------------------------------------------------------------------------ No results for input(s): CHOL, HDL, LDLCALC, TRIG, CHOLHDL, LDLDIRECT in the last 72  hours. ------------------------------------------------------------------------------------------------------------------ No results for input(s): TSH, T4TOTAL, T3FREE, THYROIDAB in the last 72 hours.  Invalid input(s): FREET3 ------------------------------------------------------------------------------------------------------------------ No results for input(s): VITAMINB12, FOLATE, FERRITIN, TIBC, IRON, RETICCTPCT in the last 72 hours.  Coagulation profile  Recent Labs Lab 09/25/15 2130  INR 1.13    No results for input(s): DDIMER in the last 72 hours.  Cardiac Enzymes  Recent Labs Lab 09/25/15 1700 09/25/15 2130 09/26/15 0310  TROPONINI 0.11* 0.11* 0.09*   ------------------------------------------------------------------------------------------------------------------ Invalid input(s): POCBNP     Time Spent in minutes   25 minutes   ELGERGAWY, DAWOOD M.D on 10/02/2015 at 10:41 AM  Between 7am to 7pm - Pager - [phone]  After 7pm go to [email or web address] - password [facility]  [facility]   Office  [phone]

## 2015-10-03 ENCOUNTER — Inpatient Hospital Stay (HOSPITAL_COMMUNITY): Payer: Medicare Other

## 2015-10-03 LAB — GLUCOSE, CAPILLARY
GLUCOSE-CAPILLARY: 101 mg/dL — AB (ref 65–99)
Glucose-Capillary: 136 mg/dL — ABNORMAL HIGH (ref 65–99)

## 2015-10-03 MED ORDER — FUROSEMIDE 40 MG PO TABS: 60.0000 mg | ORAL_TABLET | Freq: Two times a day (BID) | ORAL | Status: AC

## 2015-10-03 MED ORDER — BISACODYL 10 MG RE SUPP: 10.0000 mg | Freq: Every day | RECTAL | Status: AC | PRN

## 2015-10-03 MED ORDER — PREDNISONE 10 MG PO TABS: 10.0000 mg | ORAL_TABLET | Freq: Every day | ORAL | Status: AC

## 2015-10-03 MED ORDER — INSULIN ASPART 100 UNIT/ML ~~LOC~~ SOLN: 0.0000 [IU] | Freq: Three times a day (TID) | SUBCUTANEOUS | Status: DC

## 2015-10-03 MED ORDER — CLONIDINE HCL 0.1 MG PO TABS: 0.1000 mg | ORAL_TABLET | Freq: Two times a day (BID) | ORAL | Status: DC

## 2015-10-03 MED ORDER — FUROSEMIDE 10 MG/ML IJ SOLN
40.0000 mg | Freq: Two times a day (BID) | INTRAMUSCULAR | Status: DC
  Administered 2015-10-03: 40 mg via INTRAVENOUS
  Filled 2015-10-03: qty 4

## 2015-10-03 MED ORDER — FUROSEMIDE 10 MG/ML IJ SOLN: 80.0000 mg | Freq: Once | INTRAMUSCULAR | Status: DC

## 2015-10-03 MED ORDER — FUROSEMIDE 10 MG/ML IJ SOLN: 40.0000 mg | Freq: Once | INTRAMUSCULAR | Status: DC

## 2015-10-03 MED ORDER — PRO-STAT SUGAR FREE PO LIQD: 30.0000 mL | Freq: Two times a day (BID) | ORAL | Status: AC

## 2015-10-03 MED ORDER — ARFORMOTEROL TARTRATE 15 MCG/2ML IN NEBU: 15.0000 ug | INHALATION_SOLUTION | Freq: Two times a day (BID) | RESPIRATORY_TRACT | Status: AC

## 2015-10-03 MED ORDER — PANTOPRAZOLE SODIUM 40 MG PO TBEC: 40.0000 mg | DELAYED_RELEASE_TABLET | Freq: Every day | ORAL | Status: AC

## 2015-10-03 MED ORDER — SENNOSIDES-DOCUSATE SODIUM 8.6-50 MG PO TABS: 2.0000 | ORAL_TABLET | Freq: Two times a day (BID) | ORAL | Status: AC

## 2015-10-03 MED ORDER — DILTIAZEM HCL ER COATED BEADS 180 MG PO CP24: 180.0000 mg | ORAL_CAPSULE | Freq: Every day | ORAL | Status: DC

## 2015-10-03 MED ORDER — BISACODYL 5 MG PO TBEC: 10.0000 mg | DELAYED_RELEASE_TABLET | Freq: Every day | ORAL | Status: AC | PRN

## 2015-10-03 NOTE — Progress Notes (Signed)
Patient will discharge to [facility] Anticipated discharge date: 10/03/15 Family notified: sister Transportation by [employer]- called at 1:30pm  [place] signing off.  Domenica Reamer, [place] Social Worker [phone]

## 2015-10-03 NOTE — Progress Notes (Signed)
O2 sats on 4 liters 83%. Enc CDB and use of flutter valve. Resp even and unlab. With no complaints. Resp. Theraphy call to come assess patient. Inc O2 to 6 liters [place]. At 02:44 sats 87 %. 02:52 Resp.Ther.  applied Vent. Mask at 55% O2 sats up to 98%  02:54 dec Vent. Mask to 50% 98% 02:56 dec Vent Mask to 45 % says 97 % oxygen sat on vent Mask 95% . Cont to monitor Patient and Resp. Status.

## 2015-10-03 NOTE — Progress Notes (Signed)
Patient bladder scanned. 110 in bladder. No action needed at this time.  Will continue to monitor.  Aaminah Forrester, Mervin Kung RN

## 2015-10-03 NOTE — Discharge Summary (Signed)
Caitlin Hampton, is a 70 y.o. female  DOB 1946/04/19  MRN [id number].  Admission date:  09/11/2015  Admitting Physician  Annita Brod, MD  Discharge Date:  10/03/2015   Primary MD  [name], MD  Recommendations for primary care physician for things to follow:  - check CBC, BMP in 3 days. - Patient needs her weight to be checked daily, to be given extra dose of Lasix if develops signs of volume overload. - To follow with orthopedic Dr. Ninfa Linden in 2 weeks from discharge regarding her skin graft, continue with wound care as instructed. - Patient on oxygen 4 L nasal cannula, baseline 3 L nasal cannula. - Please schedule an appointment with orthopedic Dr. Ninfa Linden in 2 weeks to follow on the skin graft.   Admission Diagnosis  Fall [W19.XXXA] COPD exacerbation ([place]) [J44.1] Fall, initial encounter [W19.XXXA] Leg hematoma, right, initial encounter [S80.11XA]   Discharge Diagnosis  Fall [W19.XXXA] COPD exacerbation ([place]) [J44.1] Fall, initial encounter [W19.XXXA] Leg hematoma, right, initial encounter [S80.11XA]    Principal Problem:   Acute and chronic respiratory failure with hypoxia (HCC) Active Problems:   Hypertension   Hypothyroidism   Acute on chronic diastolic CHF (congestive heart failure) (HCC)   HX: anticoagulation   Morbid obesity ([place])   COPD exacerbation (HCC)   Sepsis ([place])   Chronic a-fib (HCC)   UTI (urinary tract infection)   Hematoma of right lower extremity   CAP (community acquired pneumonia)   Pressure ulcer      Past Medical History  Diagnosis Date  . Hypertension   . Thyroid disease   . COPD (chronic obstructive pulmonary disease) ([place])   . Anxiety   . H/O hiatal hernia   . Hypothyroidism   . Shortness of breath   . Atrial flutter ([place]) 09/07/2012    , RVR/ hospital 12/13- rate control and Echo normal- on xarelto, when spontaneously converted she had  bradycardia on Metoprolol and Digoxin  . GERD (gastroesophageal reflux disease)   . Rhinitis   . Parotitis     , on CT of face- 12/13  . CHF (congestive heart failure) ([place])   . Hx of echocardiogram     Echo (8/15):  EF 60-65%  . NSTEMI (non-ST elevated myocardial infarction) ([place])   . CAP (community acquired pneumonia) 09/14/2015    Past Surgical History  Procedure Laterality Date  . Tonsillectomy    . Breast surgery      benign R brest CA  . Cardioversion N/A 04/02/2014    Procedure: CARDIOVERSION;  Surgeon: Josue Hector, MD;  Location: [place] [facility];  Service: Cardiovascular;  Laterality: N/A;  . Cardioversion N/A 08/17/2014    Procedure: CARDIOVERSION;  Surgeon: [name], MD;  Location: [place];  Service: Cardiovascular;  Laterality: N/A;  . I&d extremity Right 09/18/2015    Procedure: IRRIGATION AND DEBRIDEMENT RIGHT LOWER LEG.;  Surgeon: Mcarthur Rossetti, MD;  Location: [place];  Service: Orthopedics;  Laterality: Right;  . Application of wound vac Right 09/18/2015  Procedure: APPLICATION OF WOUND VAC RIGHT LOWER LEG;  Surgeon: Mcarthur Rossetti, MD;  Location: [facility];  Service: Orthopedics;  Laterality: Right;  . Skin split graft Right 09/23/2015    Procedure: SKIN GRAFT SPLIT THICKNESS RIGHT LEG;  Surgeon: Mcarthur Rossetti, MD;  Location: [facility];  Service: Orthopedics;  Laterality: Right;  Right Thigh       History of present illness and  Hospital Course:     Kindly see H&P for history of present illness and admission details, please review complete Labs, Consult reports and Test reports for all details in brief  HPI  from the history and physical done on the day of admission 09/11/2015  HPI: Caitlin Hampton is a 70 y.o. female  With past medical history of COPD on chronic oxygen, atrial fibrillation on chronic anticoagulation and CAD who for the last week started having progressively increasing dyspnea on exertion and a mostly nonproductive cough who  decided to go see her PCP today after she started coughing up small amounts of blood. When she tried to get to her sister's car, she fell hitting her left leg which led to a very large bruise and swelling. Patient unable to stand so she came into the emergency room. In the emergency room, no evidence of fracture. Low extremity found to have a very large hematoma. Patient found to be in acute respiratory distress requiring additional supplemental oxygen lab work noted a mild leukocytosis and a lactic acid level of 2. Patient given breathing treatments and an ABG done noted no signs of mild hypoxia. Chest x-ray was unrevealing, but then a repeat lactic acid level had increased to 2.55. Case discussed with hospitalist and code sepsis initiated. Blood cultures drawn and patient started on aggressive IV volume resuscitation plus antibiotics.   Hospital Course  Patient is an 70 year old female past oral history of COPD on chronic 3 L oxygen, atrial fibrillation on chronic anticoagulation and CAD presents with fall, and left lower extremity hematoma, acute on chronic respiratory failure secondary to pneumonia and COPD exacerbation, as well had sepsis secondary to UTI and pneumonia. Dr Ninfa Linden was consulted for evacuation of right lower extremity hematoma.went to or 12/26 for I&D, and 12/31 for skin graft . As well patient noted to have acute on chronic respiratory failure secondary to volume overload, patient with acute worsening respiratory failure secondary to COPD exacerbation required intubation 09/25/2015, successfully extubated 09/27/2015. Respiratory status improved on IV diuresis, weaned off IV steroids, currently on steroid taper on discharge.  Acute on chronic hypoxic respiratory failure  - Appears to be multifactorial, secondary to acute on chronic diastolic CHF, COPD exacerbation, and pneumonia . - Patient finished antibiotic treatment for her pneumonia . - On tapering dose steroid for COPD acute  exacerbation. - Successfully extubated 09/27/2015. - Try to wean oxygen back to baseline, currently on 4 L, she is on 3 L at baseline.  Right lower extremity hematoma status post fall - Patient on chronic anticoagulation Xarelto at home. - Seen by Dr. Ninfa Linden, status post I&D 09/18/2015, skin graft 09/23/2015, wound VAC discontinued 09/25/2015 - Followed by orthopedic Dr. Ninfa Linden., Continue with wound care as instructed, follow with Dr. Ninfa Linden as an outpatient  CAP - Treated  Acute COPD exacerbation - Continue with the steroid taper, received 10 mg by mouth prednisone for next 3 days then stop. - Continue with nebs as needed - Continue with brovana , Pulmicort, and Spiriva  Acute on chronic diastolic CHF - Cardiac enzymes negative 3.  TSH within normal limits. 2-D echo with EF of 55-60% with no wall motion abnormalities. Severely elevated pulmonary pressure. - Patient was on IV diuresis during hospital stay ,Remains with positive balance +3.5 L since admission, but volume status is improving, we'll discharge on the sixth 60 mg oral twice a day. - Please wait patient daily, give extra dose of Lasix for any signs of volume overload.   Sepsis - Present on admission, resolved  Acute blood loss anemia - secondary to hematoma. hemoglobin as low as 6.3, required 2 unit transfusion on 12/27 . - Continue with iron supplement  Hypothyroidism - Continue with Synthroid  Chronic atrial fibrillation - ChadsVasc >2, on Xarelto at home, cleared by orthopedic to resume anticoagulation, initially on Lovenox, resumed back on Xarelto.  Constipation - Resolved  Discharge Condition:    Follow UP  Follow-up Information    Follow up with [facility] .   Specialty:  [place] information:   [id number] [phone]      Follow up with Mcarthur Rossetti, MD. Schedule an appointment as soon as  possible for a visit in 2 weeks.   Specialty:  Orthopedic Surgery   Contact information:   [place] [address] [phone]       Schedule an appointment as soon as possible for a visit with [name], MD.   Specialty:  Internal Medicine   Why:  After discharge from SNF   Contact information:   [address] [place] [address] [phone]         Discharge Instructions  and  Discharge Medications    Discharge Instructions    Discharge instructions    Complete by:  As directed   Follow with Primary MD [name], MD after discharge from SNF  Get CBC, CMP, 2 view Chest X ray checked  by Primary MD next visit.    Activity: As tolerated with Full fall precautions use walker/cane & assistance as needed   Disposition SNF   Diet: Heart Healthy , carbohydrate modified , with feeding assistance and aspiration precautions.  For Heart failure patients - Check your Weight same time everyday, if you gain over 2 pounds, or you develop in leg swelling, experience more shortness of breath or chest pain, call your Primary MD immediately. Follow Cardiac Low Salt Diet and 1.5 lit/day fluid restriction.   On your next visit with your primary care physician please Get Medicines reviewed and adjusted.   Please request your Prim.MD to go over all Hospital Tests and Procedure/Radiological results at the follow up, please get all Hospital records sent to your Prim MD by signing hospital release before you go home.   If you experience worsening of your admission symptoms, develop shortness of breath, life threatening emergency, suicidal or homicidal thoughts you must seek medical attention immediately by calling 911 or calling your MD immediately  if symptoms less severe.  You Must read complete instructions/literature along with all the possible adverse reactions/side effects for all the Medicines you take and that have been prescribed to you.  Take any new Medicines after you have completely understood and accpet all the possible adverse reactions/side effects.   Do not drive, operating heavy machinery, perform activities at heights, swimming or participation in water activities or provide baby sitting services if your were admitted for syncope or siezures until you have seen by Primary MD or a Neurologist and advised  to do so again.  Do not drive when taking Pain medications.    Do not take more than prescribed Pain, Sleep and Anxiety Medications  Special Instructions: If you have smoked or chewed Tobacco  in the last 2 yrs please stop smoking, stop any regular Alcohol  and or any Recreational drug use.  Wear Seat belts while driving.   Please note  You were cared for by a hospitalist during your hospital stay. If you have any questions about your discharge medications or the care you received while you were in the hospital after you are discharged, you can call the unit and asked to speak with the hospitalist on call if the hospitalist that took care of you is not available. Once you are discharged, your primary care physician will handle any further medical issues. Please note that NO REFILLS for any discharge medications will be authorized once you are discharged, as it is imperative that you return to your primary care physician (or establish a relationship with a primary care physician if you do not have one) for your aftercare needs so that they can reassess your need for medications and monitor your lab values.     Discharge wound care:    Complete by:  As directed   Right hip pressure ulcer Float right heel while in bed due to pressure ulcer on heel. Can leave current xeroform on right thigh, but change dry dressing over the xerofrom daily (thigh donor site) Only needs a dry dressing on her right leg skin graft daily to very other day. - Wound care regarding  Skin graft on right leg , At this point, just needs dry dressing  daily on both her right thigh and her right leg. Can't follow with Dr. Ninfa Linden in 2 weeks     Increase activity slowly    Complete by:  As directed             Medication List    TAKE these medications        acetaminophen 500 MG tablet  Commonly known as:  TYLENOL  Take 1,000 mg by mouth every 6 (six) hours as needed for mild pain.     albuterol (2.5 MG/3ML) 0.083% nebulizer solution  Commonly known as:  PROVENTIL  Take 2.5 mg by nebulization every 6 (six) hours as needed for wheezing or shortness of breath.     arformoterol 15 MCG/2ML Nebu  Commonly known as:  BROVANA  Take 2 mLs (15 mcg total) by nebulization 2 (two) times daily.     atorvastatin 10 MG tablet  Commonly known as:  LIPITOR  Take 10 mg by mouth daily.     bisacodyl 5 MG EC tablet  Commonly known as:  DULCOLAX  Take 2 tablets (10 mg total) by mouth daily as needed for moderate constipation.     bisacodyl 10 MG suppository  Commonly known as:  DULCOLAX  Place 1 suppository (10 mg total) rectally daily as needed for moderate constipation.     budesonide 0.25 MG/2ML nebulizer solution  Commonly known as:  PULMICORT  Take 2 mLs (0.25 mg total) by nebulization 2 (two) times daily.     cloNIDine 0.1 MG tablet  Commonly known as:  CATAPRES  Take 1 tablet (0.1 mg total) by mouth 2 (two) times daily.     diltiazem 180 MG 24 hr capsule  Commonly known as:  CARDIZEM CD  Take 1 capsule (180 mg total) by mouth daily.     feeding  supplement (PRO-STAT SUGAR FREE 64) Liqd  Take 30 mLs by mouth 2 (two) times daily.     furosemide 40 MG tablet  Commonly known as:  LASIX  Take 1.5 tablets (60 mg total) by mouth 2 (two) times daily.     insulin aspart 100 UNIT/ML injection  Commonly known as:  novoLOG  Inject 0-15 Units into the skin 3 (three) times daily with meals.     iron polysaccharides 150 MG capsule  Commonly known as:  NIFEREX  Take 1 capsule (150 mg total) by mouth 2 (two) times daily.      levothyroxine 100 MCG tablet  Commonly known as:  SYNTHROID, LEVOTHROID  Take 100 mcg by mouth daily.     pantoprazole 40 MG tablet  Commonly known as:  PROTONIX  Take 1 tablet (40 mg total) by mouth daily at 12 noon.     potassium chloride SA 20 MEQ tablet  Commonly known as:  K-DUR,KLOR-CON  TAKE ONE TABLET EACH DAY     predniSONE 10 MG tablet  Commonly known as:  DELTASONE  Take 1 tablet (10 mg total) by mouth daily with breakfast. Please take once daily for next 3 days then stop     rivaroxaban 20 MG Tabs tablet  Commonly known as:  XARELTO  Take 1 tablet (20 mg total) by mouth daily with breakfast.     senna-docusate 8.6-50 MG tablet  Commonly known as:  Senokot-S  Take 2 tablets by mouth 2 (two) times daily.     tiotropium 18 MCG inhalation capsule  Commonly known as:  SPIRIVA  Place 18 mcg into inhaler and inhale daily.          Diet and Activity recommendation: See Discharge Instructions above   Consults obtained -  PCCM Orthopedic Dr. Ninfa Linden   Major procedures and Radiology Reports - PLEASE review detailed and final reports for all details, in brief -    Bedside I & D evacuation of right lower extremity hematoma 09/14/2015  2-D echo 09/14/2015  2 units packed red blood cells 09/19/2015  skin grafting 12/31  Intubated 09/25/2015, extubated 09/27/2015   Dg Chest 2 View  09/14/2015  CLINICAL DATA:  Shortness of Breath EXAM: CHEST  2 VIEW COMPARISON:  September 11, 2015 FINDINGS: There is patchy atelectasis in each lung base. There is mild interstitial edema with cardiomegaly and borderline pulmonary venous hypertension. No adenopathy. There is atherosclerotic calcification in the aorta. There is degenerative change in the thoracic spine. There is a small right pleural effusion. IMPRESSION: Findings indicative of a degree of congestive heart failure. Electronically Signed   By: Lowella Grip III M.D.   On: 09/14/2015 09:59   Dg Chest Port 1  View  10/03/2015  CLINICAL DATA:  Increased  Difficulty breathing EXAM: PORTABLE CHEST 1 VIEW COMPARISON:  09/27/2015 FINDINGS: Extubation. Removal of nasogastric tube. Cardiomegaly accentuated by AP portable technique. Atherosclerosis in the transverse aorta. No pleural effusion or pneumothorax. Patchy right upper and right lower lobe opacities persist. Underlying interstitial thickening is mild. No overt congestive failure. IMPRESSION: Persistent right-sided patchy opacities, suspicious for infection. Cardiomegaly and aortic atherosclerosis, without congestive failure. Electronically Signed   By: Abigail Miyamoto M.D.   On: 10/03/2015 08:01   Dg Chest Port 1 View  09/27/2015  CLINICAL DATA:  Follow-up respiratory failure. EXAM: PORTABLE CHEST 1 VIEW COMPARISON:  09/26/2015 FINDINGS: Endotracheal tube unchanged with tip 2.5 cm above the carina. Nasogastric tube courses through the stomach as tip is not definitely  visualized. Lungs are adequately inflated with mild stable prominence of the perihilar markings. Minimal patchy opacification over the lateral right upper lobe and right base without significant change as this may be due to ongoing infection. No evidence of effusion. Stable cardiomegaly. Remainder of the exam is unchanged. IMPRESSION: Subtle patchy density over the lateral right upper lobe and right base which may be due to infection. Stable cardiomegaly with suggestion of mild vascular congestion. Electronically Signed   By: Marin Olp M.D.   On: 09/27/2015 08:06   Dg Chest Port 1 View  09/26/2015  CLINICAL DATA:  Acute respiratory failure, CHF, COPD, sepsis, morbid obesity. EXAM: PORTABLE CHEST 1 VIEW COMPARISON:  Portable chest x-ray of September 25, 2015 FINDINGS: The lungs are reasonably well inflated. The interstitial markings remain coarse bilaterally. There is new atelectasis or early infiltrate at the lung bases. There is no pleural effusion or pneumothorax. The cardiac silhouette is enlarged.  The pulmonary vascularity is not engorged. The endotracheal tube tip lies 2.6 cm above the carina. The esophagogastric tube tip projects below the inferior margin of the image. IMPRESSION: Interval development of subsegmental atelectasis at the lung bases. Stable cardiomegaly and bilateral pulmonary interstitial prominence. Electronically Signed   By: David  Martinique M.D.   On: 09/26/2015 07:38   Dg Chest Port 1 View  09/25/2015  CLINICAL DATA:  Respiratory failure. EXAM: PORTABLE CHEST 1 VIEW COMPARISON:  Earlier today at 1701 hours FINDINGS: Endotracheal tube terminates approximately 15 mm above the carina. Nasogastric tube is poorly visualized distally but likely terminates at the distal stomach. Numerous leads and wires project over the chest. Cardiomegaly accentuated by AP portable technique. No pleural effusion or pneumothorax. Low lung volumes with resultant pulmonary interstitial prominence. No lobar consolidation. Mild pulmonary venous congestion remains. IMPRESSION: Cardiomegaly and low lung volumes with persistent pulmonary venous congestion. Endotracheal tube terminating 1.5 cm above carina. Consider retraction 2-3 cm. Electronically Signed   By: Abigail Miyamoto M.D.   On: 09/25/2015 18:41   Dg Chest Port 1 View  09/25/2015  CLINICAL DATA:  Acute respiratory failure. Endotracheal tube placement. EXAM: PORTABLE CHEST 1 VIEW COMPARISON:  09/25/2015 FINDINGS: Endotracheal tube has been placed, tip overlying the level of the lower trachea, 1.6 cm above the carina. Consider withdrawing endotracheal tube 1-2 cm. The heart is enlarged. The left hemidiaphragm is elevated and stable. There is mild right lower lobe atelectasis. There has been some improvement of interstitial edema. Nasogastric tube is in place, tip in the stomach. IMPRESSION: 1. Interval placement of nasogastric tube ; placement of endotracheal tube with tip 1.6 cm above carina. 2. Mild improvement in pulmonary edema. Electronically Signed   By:  Nolon Nations M.D.   On: 09/25/2015 17:47   Dg Chest Port 1 View  09/25/2015  CLINICAL DATA:  70 year old female with shortness of breath. EXAM: PORTABLE CHEST 1 VIEW COMPARISON:  09/22/2015 and prior exams FINDINGS: Cardiomegaly and bilateral interstitial opacities are unchanged. Mild bibasilar atelectasis again noted. There is no evidence of pneumothorax. No other interval changes identified. IMPRESSION: Unchanged appearance of the chest with cardiomegaly, probable interstitial pulmonary edema and basilar atelectasis. Electronically Signed   By: Margarette Canada M.D.   On: 09/25/2015 09:00   Dg Chest Port 1 View  09/22/2015  CLINICAL DATA:  Dyspnea. EXAM: PORTABLE CHEST 1 VIEW COMPARISON:  09/14/2015. FINDINGS: Stable enlarged cardiac silhouette. Mild increase in prominence of the pulmonary vasculature and interstitial markings. No visible pleural fluid. Unremarkable bones. IMPRESSION: Mildly progressive changes of congestive  heart failure. Electronically Signed   By: Claudie Revering M.D.   On: 09/22/2015 13:51   Dg Chest Portable 1 View  09/11/2015  CLINICAL DATA:  Shortness of breath 1 day. EXAM: PORTABLE CHEST 1 VIEW COMPARISON:  12/13/2014 and 12/11/2014 FINDINGS: Lordotic technique demonstrated. Subtle stable elevation of the left hemidiaphragm. Lungs are hypoinflated with minimal prominence of the central perihilar markings likely minimal vascular congestion. No definite effusion. No lobar consolidation. Stable moderate cardiomegaly. There is calcified plaque over the aortic arch. Remainder of the exam is unchanged. IMPRESSION: Stable cardiomegaly with findings suggesting mild vascular congestion. Electronically Signed   By: Marin Olp M.D.   On: 09/11/2015 15:26   Dg Tibia/fibula Right Port  09/11/2015  CLINICAL DATA:  Status post fall today with a right lower leg injury and pain. Initial encounter. EXAM: PORTABLE RIGHT TIBIA AND FIBULA - 2 VIEW COMPARISON:  None. FINDINGS: No acute bony or  joint abnormality is identified. Degenerative change about the right knee is noted. Scattered atherosclerotic calcifications are seen. IMPRESSION: No acute abnormality. Electronically Signed   By: Inge Rise M.D.   On: 09/11/2015 15:26   Dg Abd Portable 1v  09/25/2015  CLINICAL DATA:  Abdominal distention. EXAM: PORTABLE ABDOMEN - 1 VIEW COMPARISON:  None. FINDINGS: The stomach shows gaseous distention. There is a fairly large amount of fecal material throughout the colon without evidence of significant focal impaction or bowel obstruction. Some gas in nondilated small bowel loops may also be consistent with a mild component of small bowel ileus. No abnormal calcifications or soft tissue abnormalities identified. IMPRESSION: Gaseous distention of stomach. Diffuse fecal material throughout the colon. Gas in nondilated small bowel loops may be reflective of a mild component of small bowel ileus. Electronically Signed   By: Aletta Edouard M.D.   On: 09/25/2015 11:28    Micro Results   No results found for this or any previous visit (from the past 240 hour(s)).     Today   Subjective:   Caitlin Hampton  today has, No headache, No chest pain, No abdominal pain - No Nausea, episode of respiratory distress overnight , resolved, back to baseline . Objective:   Blood pressure 105/76, pulse 78, temperature 98.1 F (36.7 C), temperature source Oral, resp. rate 16, height 5\' 4"  (1.626 m), weight 95.6 kg (210 lb 12.2 oz), SpO2 96 %.   Intake/Output Summary (Last 24 hours) at 10/03/15 1307 Last data filed at 10/03/15 1252  Gross per 24 hour  Intake    720 ml  Output      0 ml  Net    720 ml    Exam Awake Alert, Oriented X 3, No new F.N deficits, Normal affect [place].AT,PERRAL Supple Neck,No JVD, No cervical lymphadenopathy appriciated.  Symmetrical Chest wall movement,bibasilar crackles, mild scattered wheezing . No Gallops,Rubs or new Murmurs, No Parasternal Heave +ve B.Sounds, Abd Soft, No  tenderness, No organomegaly appriciated, No rebound - guarding or rigidity. No Cyanosis,right lower extremity wrapped with Ace wrap,   Data Review   CBC w Diff: Lab Results  Component Value Date   WBC 11.0* 10/02/2015   HGB 9.1* 10/02/2015   HCT 33.0* 10/02/2015   PLT 226 10/02/2015   LYMPHOPCT 3 09/11/2015   MONOPCT 1 09/11/2015   EOSPCT 0 09/11/2015   BASOPCT 0 09/11/2015    CMP: Lab Results  Component Value Date   NA 141 10/02/2015   K 3.8 10/02/2015   CL 90* 10/02/2015   CO2 44* 10/02/2015  BUN 35* 10/02/2015   CREATININE 0.93 10/02/2015   PROT 5.3* 09/19/2015   ALBUMIN 2.7* 09/19/2015   BILITOT 0.8 09/19/2015   ALKPHOS 76 09/19/2015   AST 26 09/19/2015   ALT 28 09/19/2015  .   Total Time in preparing paper work, data evaluation and todays exam - 35 minutes  Trevelle Mcgurn M.D on 10/03/2015 at 1:07 PM  [facility]   Office  [phone]

## 2015-10-03 NOTE — Progress Notes (Signed)
EMS arrived to take patient to [facility].  Caitlin Hampton, Mervin Kung RN

## 2015-10-03 NOTE — Care Management Note (Addendum)
Case Management Note Previous CM note initiated by Donne Anon RN, CM  Patient Details  Name: Caitlin Hampton MRN: [id number] Date of Birth: [birth year]  Subjective/Objective:    Adm w sepsis                Action/Plan: lives w sister, has home o81 w ahc   Expected Discharge Date:     10/03/15             Expected Discharge Plan:  [place]  In-House Referral:  Clinical Social Work  Discharge planning Services  CM Consult  Post Acute Care Choice:    Choice offered to:     DME Arranged:    DME Agency:     HH Arranged:    [place] Agency:     Status of Service:  Completed, signed off  Medicare Important Message Given:  Yes Date Medicare IM Given:    Medicare IM give by:    Date Additional Medicare IM Given:    Additional Medicare Important Message give by:     If discussed at [place] of Stay Meetings, dates discussed:  10/03/15  Discharge Disposition: skilled facility   Additional Comments: ur review done  10/02/15- [name]  RN, BSN-  D/c planning for SNF- CSW following for placement needs. Possible ready for d/c 10/03/15  Dawayne Patricia, RN 10/03/2015, 1:21 PM

## 2015-10-03 NOTE — Discharge Instructions (Signed)
Right hip pressure ulcer Float right heel while in bed due to pressure ulcer on heel. Can leave current xeroform on right thigh, but change dry dressing over the xerofrom daily (thigh donor site) Only needs a dry dressing on her right leg skin graft daily to very other day. - Wound care regarding  Skin graft on right leg , At this point, just needs dry dressing daily on both her right thigh and her right leg. Can't follow with Dr. Ninfa Linden in 2 weeks

## 2015-10-05 ENCOUNTER — Non-Acute Institutional Stay (SKILLED_NURSING_FACILITY): Payer: Medicare Other | Admitting: Internal Medicine

## 2015-10-05 ENCOUNTER — Encounter: Payer: Self-pay | Admitting: Internal Medicine

## 2015-10-05 DIAGNOSIS — J9610 Chronic respiratory failure, unspecified whether with hypoxia or hypercapnia: Secondary | ICD-10-CM | POA: Diagnosis not present

## 2015-10-05 DIAGNOSIS — E890 Postprocedural hypothyroidism: Secondary | ICD-10-CM | POA: Diagnosis not present

## 2015-10-05 DIAGNOSIS — J438 Other emphysema: Secondary | ICD-10-CM

## 2015-10-05 DIAGNOSIS — I5032 Chronic diastolic (congestive) heart failure: Secondary | ICD-10-CM

## 2015-10-05 DIAGNOSIS — I482 Chronic atrial fibrillation, unspecified: Secondary | ICD-10-CM

## 2015-10-05 DIAGNOSIS — R739 Hyperglycemia, unspecified: Secondary | ICD-10-CM | POA: Diagnosis not present

## 2015-10-05 DIAGNOSIS — D509 Iron deficiency anemia, unspecified: Secondary | ICD-10-CM

## 2015-10-05 DIAGNOSIS — S8011XD Contusion of right lower leg, subsequent encounter: Secondary | ICD-10-CM | POA: Diagnosis not present

## 2015-10-05 NOTE — Progress Notes (Signed)
Patient ID: Caitlin Hampton, female   DOB: [birth year], 70 y.o.   MRN: [id number]    HISTORY AND PHYSICAL   DATE: 10/05/15  Location:  [facility]    Place of Service: SNF (31)   Extended Emergency Contact Information Primary Emergency Contact: Causey,Ann Address: [address]          [place], [place] [address] [place] Phone: [phone] Mobile Phone: [phone] Relation: Sister Secondary Emergency Contact: Caviness,Bill Address: [address]           [place], [place] [address] [place] Phone: [phone] Mobile Phone: [phone] Relation: Brother  Advanced Directive information  FULL CODE  Chief Complaint  Patient presents with  . New Admit To SNF    HPI:  70 yo female seen today as a new admission into SNF following hospital stay for COPD exacerbation, sepsis, CAP, s/p fall with right leg hematoma, acute/chronic respiratory failure, acute blood loss anemia, afib/xeralto, HTN, hypothyroid, and acute/chronic diastolic HF. She req'd intubation. Her right leg hematoma req'd evacuation with I&D -->skin graft by Ortho. hematoma --> Hgb 6.3 -->2 units PRBCs-->9.1. Albumin 2.7. She presents to SNF for short term rehab   Today she c/o chronic SOB. Her pain is well controlled. No nursing issues. No falls. She wears [place] O2 ATC. She gets prostat for wounds  COPD - O2 dependent usally on 3L/min [place] O2 at home. Takes spiriva, prednisone, brovana, pulmicort, albuterol neb. Followed by pulmonary  Anxiety - currently does not take any mood meds   Thyroid - stable on levothyroxine  afib/CHF - rate controlled on cardizem. She takes xeralto for anticoagulation. She takes lasix with Kdur for edema. BP controlled on clonidine  Hyperlipidemia - stable on lipitor  Hyperglycemia - CBG 164 today. Due to prednisone use. Takes SSI  Anemia - iron deficient and takes iron supplement  GERD - stable on protonix  Past Medical History    Diagnosis Date  . Hypertension   . Thyroid disease   . COPD (chronic obstructive pulmonary disease) ([place])   . Anxiety   . H/O hiatal hernia   . Hypothyroidism   . Shortness of breath   . Atrial flutter ([place]) 09/07/2012    , RVR/ hospital 12/13- rate control and Echo normal- on xarelto, when spontaneously converted she had bradycardia on Metoprolol and Digoxin  . GERD (gastroesophageal reflux disease)   . Rhinitis   . Parotitis     , on CT of face- 12/13  . CHF (congestive heart failure) ([place])   . Hx of echocardiogram     Echo (8/15):  EF 60-65%  . NSTEMI (non-ST elevated myocardial infarction) ([place])   . CAP (community acquired pneumonia) 09/14/2015    Past Surgical History  Procedure Laterality Date  . Tonsillectomy    . Breast surgery      benign R brest CA  . Cardioversion N/A 04/02/2014    Procedure: CARDIOVERSION;  Surgeon: Josue Hector, MD;  Location: [facility];  Service: Cardiovascular;  Laterality: N/A;  . Cardioversion N/A 08/17/2014    Procedure: CARDIOVERSION;  Surgeon: [name], MD;  Location: [facility];  Service: Cardiovascular;  Laterality: N/A;  . I&d extremity Right 09/18/2015    Procedure: IRRIGATION AND DEBRIDEMENT RIGHT LOWER LEG.;  Surgeon: Mcarthur Rossetti, MD;  Location: [facility];  Service: Orthopedics;  Laterality: Right;  . Application of wound vac Right 09/18/2015    Procedure: APPLICATION OF WOUND VAC RIGHT LOWER LEG;  Surgeon: Mcarthur Rossetti, MD;  Location: [facility];  Service: Orthopedics;  Laterality: Right;  . Skin split graft Right 09/23/2015    Procedure: SKIN GRAFT SPLIT THICKNESS RIGHT LEG;  Surgeon: Mcarthur Rossetti, MD;  Location: [facility];  Service: Orthopedics;  Laterality: Right;  Right Thigh    Patient Care Team: [name], MD as PCP - General (Internal Medicine)  Social History   Social History  . Marital Status: Widowed    Spouse Name: N/A  . Number of Children: N/A  . Years of Education: N/A   Occupational  History  . Not on file.   Social History Main Topics  . Smoking status: Former Smoker -- 1.50 packs/day    Types: Cigarettes    Quit date: 07/23/2012  . Smokeless tobacco: Not on file  . Alcohol Use: No     Comment: occassionally  . Drug Use: No  . Sexual Activity: No   Other Topics Concern  . Not on file   Social History Narrative     reports that she quit smoking about 3 years ago. Her smoking use included Cigarettes. She smoked 1.50 packs per day. She does not have any smokeless tobacco history on file. She reports that she does not drink alcohol or use illicit drugs.  Family History  Problem Relation Age of Onset  . CAD Mother   . Stroke Mother   . Prostate cancer Father   . Dementia Father    Family Status  Relation Status Death Age  . Mother Deceased   . Father Deceased   . Sister Alive   . Maternal Grandmother Deceased   . Maternal Grandfather Deceased   . Paternal Grandmother Deceased   . Paternal Grandfather Deceased      There is no immunization history on file for this patient.  No Known Allergies  Medications: Patient's Medications  New Prescriptions   No medications on file  Previous Medications   ACETAMINOPHEN (TYLENOL) 500 MG TABLET    Take 1,000 mg by mouth every 6 (six) hours as needed for mild pain.   ALBUTEROL (PROVENTIL) (2.5 MG/3ML) 0.083% NEBULIZER SOLUTION    Take 2.5 mg by nebulization every 6 (six) hours as needed for wheezing or shortness of breath.   AMINO ACIDS-PROTEIN HYDROLYS (FEEDING SUPPLEMENT, PRO-STAT SUGAR FREE 64,) LIQD    Take 30 mLs by mouth 2 (two) times daily.   ARFORMOTEROL (BROVANA) 15 MCG/2ML NEBU    Take 2 mLs (15 mcg total) by nebulization 2 (two) times daily.   ATORVASTATIN (LIPITOR) 10 MG TABLET    Take 10 mg by mouth daily.    BISACODYL (DULCOLAX) 10 MG SUPPOSITORY    Place 1 suppository (10 mg total) rectally daily as needed for moderate constipation.   BISACODYL (DULCOLAX) 5 MG EC TABLET    Take 2 tablets (10 mg  total) by mouth daily as needed for moderate constipation.   BUDESONIDE (PULMICORT) 0.25 MG/2ML NEBULIZER SOLUTION    Take 2 mLs (0.25 mg total) by nebulization 2 (two) times daily.   CLONIDINE (CATAPRES) 0.1 MG TABLET    Take 1 tablet (0.1 mg total) by mouth 2 (two) times daily.   DILTIAZEM (CARDIZEM CD) 180 MG 24 HR CAPSULE    Take 1 capsule (180 mg total) by mouth daily.   FUROSEMIDE (LASIX) 40 MG TABLET    Take 1.5 tablets (60 mg total) by mouth 2 (two) times daily.   INSULIN ASPART (NOVOLOG) 100 UNIT/ML INJECTION    Inject 0-15  Units into the skin 3 (three) times daily with meals.   IRON POLYSACCHARIDES (NIFEREX) 150 MG CAPSULE    Take 1 capsule (150 mg total) by mouth 2 (two) times daily.   LEVOTHYROXINE (SYNTHROID, LEVOTHROID) 100 MCG TABLET    Take 100 mcg by mouth daily.   PANTOPRAZOLE (PROTONIX) 40 MG TABLET    Take 1 tablet (40 mg total) by mouth daily at 12 noon.   POTASSIUM CHLORIDE SA (K-DUR,KLOR-CON) 20 MEQ TABLET    TAKE ONE TABLET EACH DAY   PREDNISONE (DELTASONE) 10 MG TABLET    Take 1 tablet (10 mg total) by mouth daily with breakfast. Please take once daily for next 3 days then stop   RIVAROXABAN (XARELTO) 20 MG TABS    Take 1 tablet (20 mg total) by mouth daily with breakfast.   SENNA-DOCUSATE (SENOKOT-S) 8.6-50 MG TABLET    Take 2 tablets by mouth 2 (two) times daily.   TIOTROPIUM (SPIRIVA) 18 MCG INHALATION CAPSULE    Place 18 mcg into inhaler and inhale daily.  Modified Medications   No medications on file  Discontinued Medications   No medications on file    Review of Systems  Respiratory: Positive for shortness of breath.   Musculoskeletal: Positive for arthralgias and gait problem.  Skin: Positive for wound.  All other systems reviewed and are negative.   Filed Vitals:   10/05/15 1730  BP: 98/66  Pulse: 96  Temp: 97.2 F (36.2 C)  SpO2: 93%   There is no weight on file to calculate BMI.  Physical Exam  Constitutional: She is oriented to person, place,  and time. She appears well-developed and well-nourished.  Lying in bed with conversational dyspnea, frail appearing, [place] O2 intact  HENT:  Mouth/Throat: Oropharynx is clear and moist. No oropharyngeal exudate.  MMM  Eyes: Pupils are equal, round, and reactive to light. No scleral icterus.  Neck: Neck supple. Carotid bruit is not present. No tracheal deviation present. No thyromegaly present.  Cardiovascular: Normal rate and intact distal pulses.  An irregularly irregular rhythm present. Exam reveals no gallop and no friction rub.   Murmur heard.  Systolic murmur is present with a grade of 1/6  No LE edema b/l. no calf TTP.   Pulmonary/Chest: Effort normal and breath sounds normal. No stridor. No respiratory distress. She has no wheezes. She has no rales.  Abdominal: Soft. Bowel sounds are normal. She exhibits no distension and no mass. There is no hepatomegaly. There is no tenderness. There is no rebound and no guarding.  Lymphadenopathy:    She has no cervical adenopathy.  Neurological: She is alert and oriented to person, place, and time.  Skin: Skin is warm and dry. No rash noted.  Right foot in unna boot; right anterior thigh dsg c/d/i; right posterior arm ecchymosis  Psychiatric: Her behavior is normal. Thought content normal. Her mood appears anxious.     Labs reviewed: Admission on 09/11/2015, Discharged on 10/03/2015  No results displayed because visit has over 200 results.  CBC Latest Ref Rng 10/02/2015 10/01/2015 09/30/2015  WBC 4.0 - 10.5 K/uL 11.0(H) 12.8(H) 12.0(H)  Hemoglobin 12.0 - 15.0 g/dL 9.1(L) 9.3(L) 8.4(L)  Hematocrit 36.0 - 46.0 % 33.0(L) 33.5(L) 29.8(L)  Platelets 150 - 400 K/uL 226 182 209    CMP Latest Ref Rng 10/02/2015 10/01/2015 09/30/2015  Glucose 65 - 99 mg/dL 104(H) 94 109(H)  BUN 6 - 20 mg/dL 35(H) 44(H) 53(H)  Creatinine 0.44 - 1.00 mg/dL 0.93 0.89 1.04(H)  Sodium  135 - 145 mmol/L 141 139 141  Potassium 3.5 - 5.1 mmol/L 3.8 4.1 4.0  Chloride 101 - 111 mmol/L  90(L) 94(L) 95(L)  CO2 22 - 32 mmol/L 44(H) 37(H) 37(H)  Calcium 8.9 - 10.3 mg/dL 8.3(L) 8.3(L) 8.6(L)  Total Protein 6.5 - 8.1 g/dL - - -  Total Bilirubin 0.3 - 1.2 mg/dL - - -  Alkaline Phos 38 - 126 U/L - - -  AST 15 - 41 U/L - - -  ALT 14 - 54 U/L - - -        Dg Chest 2 View  09/14/2015  CLINICAL DATA:  Shortness of Breath EXAM: CHEST  2 VIEW COMPARISON:  September 11, 2015 FINDINGS: There is patchy atelectasis in each lung base. There is mild interstitial edema with cardiomegaly and borderline pulmonary venous hypertension. No adenopathy. There is atherosclerotic calcification in the aorta. There is degenerative change in the thoracic spine. There is a small right pleural effusion. IMPRESSION: Findings indicative of a degree of congestive heart failure. Electronically Signed   By: Lowella Grip III M.D.   On: 09/14/2015 09:59   Dg Chest Port 1 View  10/03/2015  CLINICAL DATA:  Increased  Difficulty breathing EXAM: PORTABLE CHEST 1 VIEW COMPARISON:  09/27/2015 FINDINGS: Extubation. Removal of nasogastric tube. Cardiomegaly accentuated by AP portable technique. Atherosclerosis in the transverse aorta. No pleural effusion or pneumothorax. Patchy right upper and right lower lobe opacities persist. Underlying interstitial thickening is mild. No overt congestive failure. IMPRESSION: Persistent right-sided patchy opacities, suspicious for infection. Cardiomegaly and aortic atherosclerosis, without congestive failure. Electronically Signed   By: Abigail Miyamoto M.D.   On: 10/03/2015 08:01   Dg Chest Port 1 View  09/27/2015  CLINICAL DATA:  Follow-up respiratory failure. EXAM: PORTABLE CHEST 1 VIEW COMPARISON:  09/26/2015 FINDINGS: Endotracheal tube unchanged with tip 2.5 cm above the carina. Nasogastric tube courses through the stomach as tip is not definitely visualized. Lungs are adequately inflated with mild stable prominence of the perihilar markings. Minimal patchy opacification over the  lateral right upper lobe and right base without significant change as this may be due to ongoing infection. No evidence of effusion. Stable cardiomegaly. Remainder of the exam is unchanged. IMPRESSION: Subtle patchy density over the lateral right upper lobe and right base which may be due to infection. Stable cardiomegaly with suggestion of mild vascular congestion. Electronically Signed   By: Marin Olp M.D.   On: 09/27/2015 08:06   Dg Chest Port 1 View  09/26/2015  CLINICAL DATA:  Acute respiratory failure, CHF, COPD, sepsis, morbid obesity. EXAM: PORTABLE CHEST 1 VIEW COMPARISON:  Portable chest x-ray of September 25, 2015 FINDINGS: The lungs are reasonably well inflated. The interstitial markings remain coarse bilaterally. There is new atelectasis or early infiltrate at the lung bases. There is no pleural effusion or pneumothorax. The cardiac silhouette is enlarged. The pulmonary vascularity is not engorged. The endotracheal tube tip lies 2.6 cm above the carina. The esophagogastric tube tip projects below the inferior margin of the image. IMPRESSION: Interval development of subsegmental atelectasis at the lung bases. Stable cardiomegaly and bilateral pulmonary interstitial prominence. Electronically Signed   By: David  Martinique M.D.   On: 09/26/2015 07:38   Dg Chest Port 1 View  09/25/2015  CLINICAL DATA:  Respiratory failure. EXAM: PORTABLE CHEST 1 VIEW COMPARISON:  Earlier today at 1701 hours FINDINGS: Endotracheal tube terminates approximately 15 mm above the carina. Nasogastric tube is poorly visualized distally but likely terminates at the  distal stomach. Numerous leads and wires project over the chest. Cardiomegaly accentuated by AP portable technique. No pleural effusion or pneumothorax. Low lung volumes with resultant pulmonary interstitial prominence. No lobar consolidation. Mild pulmonary venous congestion remains. IMPRESSION: Cardiomegaly and low lung volumes with persistent pulmonary venous  congestion. Endotracheal tube terminating 1.5 cm above carina. Consider retraction 2-3 cm. Electronically Signed   By: Abigail Miyamoto M.D.   On: 09/25/2015 18:41   Dg Chest Port 1 View  09/25/2015  CLINICAL DATA:  Acute respiratory failure. Endotracheal tube placement. EXAM: PORTABLE CHEST 1 VIEW COMPARISON:  09/25/2015 FINDINGS: Endotracheal tube has been placed, tip overlying the level of the lower trachea, 1.6 cm above the carina. Consider withdrawing endotracheal tube 1-2 cm. The heart is enlarged. The left hemidiaphragm is elevated and stable. There is mild right lower lobe atelectasis. There has been some improvement of interstitial edema. Nasogastric tube is in place, tip in the stomach. IMPRESSION: 1. Interval placement of nasogastric tube ; placement of endotracheal tube with tip 1.6 cm above carina. 2. Mild improvement in pulmonary edema. Electronically Signed   By: Nolon Nations M.D.   On: 09/25/2015 17:47   Dg Chest Port 1 View  09/25/2015  CLINICAL DATA:  70 year old female with shortness of breath. EXAM: PORTABLE CHEST 1 VIEW COMPARISON:  09/22/2015 and prior exams FINDINGS: Cardiomegaly and bilateral interstitial opacities are unchanged. Mild bibasilar atelectasis again noted. There is no evidence of pneumothorax. No other interval changes identified. IMPRESSION: Unchanged appearance of the chest with cardiomegaly, probable interstitial pulmonary edema and basilar atelectasis. Electronically Signed   By: Margarette Canada M.D.   On: 09/25/2015 09:00   Dg Chest Port 1 View  09/22/2015  CLINICAL DATA:  Dyspnea. EXAM: PORTABLE CHEST 1 VIEW COMPARISON:  09/14/2015. FINDINGS: Stable enlarged cardiac silhouette. Mild increase in prominence of the pulmonary vasculature and interstitial markings. No visible pleural fluid. Unremarkable bones. IMPRESSION: Mildly progressive changes of congestive heart failure. Electronically Signed   By: Claudie Revering M.D.   On: 09/22/2015 13:51   Dg Chest Portable 1  View  09/11/2015  CLINICAL DATA:  Shortness of breath 1 day. EXAM: PORTABLE CHEST 1 VIEW COMPARISON:  12/13/2014 and 12/11/2014 FINDINGS: Lordotic technique demonstrated. Subtle stable elevation of the left hemidiaphragm. Lungs are hypoinflated with minimal prominence of the central perihilar markings likely minimal vascular congestion. No definite effusion. No lobar consolidation. Stable moderate cardiomegaly. There is calcified plaque over the aortic arch. Remainder of the exam is unchanged. IMPRESSION: Stable cardiomegaly with findings suggesting mild vascular congestion. Electronically Signed   By: Marin Olp M.D.   On: 09/11/2015 15:26   Dg Tibia/fibula Right Port  09/11/2015  CLINICAL DATA:  Status post fall today with a right lower leg injury and pain. Initial encounter. EXAM: PORTABLE RIGHT TIBIA AND FIBULA - 2 VIEW COMPARISON:  None. FINDINGS: No acute bony or joint abnormality is identified. Degenerative change about the right knee is noted. Scattered atherosclerotic calcifications are seen. IMPRESSION: No acute abnormality. Electronically Signed   By: Inge Rise M.D.   On: 09/11/2015 15:26   Dg Abd Portable 1v  09/25/2015  CLINICAL DATA:  Abdominal distention. EXAM: PORTABLE ABDOMEN - 1 VIEW COMPARISON:  None. FINDINGS: The stomach shows gaseous distention. There is a fairly large amount of fecal material throughout the colon without evidence of significant focal impaction or bowel obstruction. Some gas in nondilated small bowel loops may also be consistent with a mild component of small bowel ileus. No abnormal calcifications or  soft tissue abnormalities identified. IMPRESSION: Gaseous distention of stomach. Diffuse fecal material throughout the colon. Gas in nondilated small bowel loops may be reflective of a mild component of small bowel ileus. Electronically Signed   By: Aletta Edouard M.D.   On: 09/25/2015 11:28     Assessment/Plan   ICD-9-CM ICD-10-CM   1. Other emphysema  ([place]) 492.8 J43.8   2. Hematoma of right lower extremity, subsequent encounter V58.89 S80.11XD    924.5    3. Chronic respiratory failure, unspecified whether with hypoxia or hypercapnia ([place]) 518.83 J96.10   4. Hypothyroidism following radioiodine therapy (in her 20's), TSH WNL  244.1 E89.0   5. Chronic diastolic heart failure ([facility]) 428.32 I50.32   6. Anemia, iron deficiency 280.9 D50.9   7. Chronic a-fib ([facility]) 427.31 I48.2   8. Hyperglycemia 790.29 R73.9     Check CBC w diff and BMP  [place] O2 at 3L/min ATC  F/u with Ortho Dr Rush Farmer for skin graft  Cont current meds as ordered  PT/OT as ordered  Nutritional supplements as ordered  Wound care as ordered  GOAL: short term rehab and d/c home when medically appropriate. Communicated with pt and nursing.  Will follow   Jaziya Obarr S. Perlie Gold  [facility] [address] [place], [place] [address] [phone] Cell (Monday-Friday 8 AM - 5 PM) [phone] After 5 PM and follow prompts

## 2015-10-06 LAB — CBC AND DIFFERENTIAL
HEMATOCRIT: 31 % — AB (ref 36–46)
HEMOGLOBIN: 9.5 g/dL — AB (ref 12.0–16.0)
PLATELETS: 295 10*3/uL (ref 150–399)
WBC: 15.2 10*3/mL

## 2015-10-06 LAB — BASIC METABOLIC PANEL
BUN: 36 mg/dL — AB (ref 4–21)
Creatinine: 1 mg/dL (ref 0.5–1.1)
GLUCOSE: 118 mg/dL
Potassium: 3.6 mmol/L (ref 3.4–5.3)
SODIUM: 140 mmol/L (ref 137–147)

## 2015-10-19 ENCOUNTER — Encounter: Payer: Self-pay | Admitting: Internal Medicine

## 2015-10-19 ENCOUNTER — Non-Acute Institutional Stay (SKILLED_NURSING_FACILITY): Payer: Medicare Other | Admitting: Internal Medicine

## 2015-10-19 DIAGNOSIS — I5032 Chronic diastolic (congestive) heart failure: Secondary | ICD-10-CM

## 2015-10-19 DIAGNOSIS — F32A Depression, unspecified: Secondary | ICD-10-CM

## 2015-10-19 DIAGNOSIS — R630 Anorexia: Secondary | ICD-10-CM | POA: Diagnosis not present

## 2015-10-19 DIAGNOSIS — J438 Other emphysema: Secondary | ICD-10-CM

## 2015-10-19 DIAGNOSIS — F329 Major depressive disorder, single episode, unspecified: Secondary | ICD-10-CM

## 2015-10-19 NOTE — Progress Notes (Signed)
Patient ID: [name], female   DOB: 03-Jun-1946, 70 y.o.   MRN: [id number]    DATE:10/19/15  Location:  [facility]    Place of Service: SNF (31)   Extended Emergency Contact Information Primary Emergency Contact: Causey,Ann Address: [address]          [place], [place] [address] [place] Phone: [phone] Mobile Phone: [phone] Relation: Sister Secondary Emergency Contact: Caviness,Bill Address: [address]           [place], [place] [address] [place] of [place] Phone: [phone] Mobile Phone: [phone] Relation: Brother  Advanced Directive information  [place]  Chief Complaint  Patient presents with  . Medical Management of Chronic Issues    feels depressed; no appetite    HPI:  70 yo female seen today for f/u. Sister present and c/a her overall health. Pt has only been eating soft foods (fruit cups, jello, etc) and refusing to wear dentures. She states the food tastes horrible, even food from home. No f/c. No HA. She refused to go to Ortho f/u appt yesterday and it had to be cancelled. No leg pain. No N/V. (+) loss of appetite. She feels depressed and does not want to participate in her tx. Denies SI/HI. She has a hx anxiety. She is a poor historian due to depressed mood. Hx obtained from chart and family  CHF/HTN/aflutter - rate usually controlled with cardizem CD. She also takes clonidine and lasix + Kdur. lipitor for cholesterol  COPD - stable on brovana, nebs and spiriva  GERD - takes protonix. Senna/dulcolax for constipation  DM - no low BS reactions on insulin tx  Past Medical History  Diagnosis Date  . Hypertension   . Thyroid disease   . COPD (chronic obstructive pulmonary disease) ([place])   . Anxiety   . H/O hiatal hernia   . Hypothyroidism   . Shortness of breath   . Atrial flutter ([place]) 09/07/2012    , RVR/ hospital 12/13- rate control and Echo normal- on xarelto, when spontaneously converted  she had bradycardia on Metoprolol and Digoxin  . GERD (gastroesophageal reflux disease)   . Rhinitis   . Parotitis     , on CT of face- 12/13  . CHF (congestive heart failure) ([place])   . Hx of echocardiogram     Echo (8/15):  EF 60-65%  . NSTEMI (non-ST elevated myocardial infarction) ([place])   . CAP (community acquired pneumonia) 09/14/2015    Past Surgical History  Procedure Laterality Date  . Tonsillectomy    . Breast surgery      benign R brest CA  . Cardioversion N/A 04/02/2014    Procedure: CARDIOVERSION;  Surgeon: Josue Hector, MD;  Location: [facility];  Service: Cardiovascular;  Laterality: N/A;  . Cardioversion N/A 08/17/2014    Procedure: CARDIOVERSION;  Surgeon: [name], MD;  Location: [place];  Service: Cardiovascular;  Laterality: N/A;  . I&d extremity Right 09/18/2015    Procedure: IRRIGATION AND DEBRIDEMENT RIGHT LOWER LEG.;  Surgeon: Mcarthur Rossetti, MD;  Location: [place];  Service: Orthopedics;  Laterality: Right;  . Application of wound vac Right 09/18/2015    Procedure: APPLICATION OF WOUND VAC RIGHT LOWER LEG;  Surgeon: Mcarthur Rossetti, MD;  Location: [facility];  Service: Orthopedics;  Laterality: Right;  . Skin split graft Right 09/23/2015    Procedure: SKIN GRAFT SPLIT THICKNESS RIGHT LEG;  Surgeon: Mcarthur Rossetti, MD;  Location: [place];  Service: Orthopedics;  Laterality: Right;  Right Thigh    Patient Care Team: [name], MD as PCP - General (Internal Medicine)  Social History   Social History  . Marital Status: Widowed    Spouse Name: N/A  . Number of Children: N/A  . Years of Education: N/A   Occupational History  . Not on file.   Social History Main Topics  . Smoking status: Former Smoker -- 1.50 packs/day    Types: Cigarettes    Quit date: 07/23/2012  . Smokeless tobacco: Not on file  . Alcohol Use: No     Comment: occassionally  . Drug Use: No  . Sexual Activity: No   Other Topics Concern  . Not on file    Social History Narrative     reports that she quit smoking about 3 years ago. Her smoking use included Cigarettes. She smoked 1.50 packs per day. She does not have any smokeless tobacco history on file. She reports that she does not drink alcohol or use illicit drugs.   There is no immunization history on file for this patient.  No Known Allergies  Medications: Patient's Medications  New Prescriptions   No medications on file  Previous Medications   ACETAMINOPHEN (TYLENOL) 500 MG TABLET    Take 1,000 mg by mouth every 6 (six) hours as needed for mild pain.   ALBUTEROL (PROVENTIL) (2.5 MG/3ML) 0.083% NEBULIZER SOLUTION    Take 2.5 mg by nebulization every 6 (six) hours as needed for wheezing or shortness of breath.   AMINO ACIDS-PROTEIN HYDROLYS (FEEDING SUPPLEMENT, PRO-STAT SUGAR FREE 64,) LIQD    Take 30 mLs by mouth 2 (two) times daily.   ARFORMOTEROL (BROVANA) 15 MCG/2ML NEBU    Take 2 mLs (15 mcg total) by nebulization 2 (two) times daily.   ATORVASTATIN (LIPITOR) 10 MG TABLET    Take 10 mg by mouth daily.    BISACODYL (DULCOLAX) 10 MG SUPPOSITORY    Place 1 suppository (10 mg total) rectally daily as needed for moderate constipation.   BISACODYL (DULCOLAX) 5 MG EC TABLET    Take 2 tablets (10 mg total) by mouth daily as needed for moderate constipation.   BUDESONIDE (PULMICORT) 0.25 MG/2ML NEBULIZER SOLUTION    Take 2 mLs (0.25 mg total) by nebulization 2 (two) times daily.   CLONIDINE (CATAPRES) 0.1 MG TABLET    Take 1 tablet (0.1 mg total) by mouth 2 (two) times daily.   DILTIAZEM (CARDIZEM CD) 180 MG 24 HR CAPSULE    Take 1 capsule (180 mg total) by mouth daily.   FUROSEMIDE (LASIX) 40 MG TABLET    Take 1.5 tablets (60 mg total) by mouth 2 (two) times daily.   INSULIN ASPART (NOVOLOG) 100 UNIT/ML INJECTION    Inject 0-15 Units into the skin 3 (three) times daily with meals.   IRON POLYSACCHARIDES (NIFEREX) 150 MG CAPSULE    Take 1 capsule (150 mg total) by mouth 2 (two) times  daily.   LEVOTHYROXINE (SYNTHROID, LEVOTHROID) 100 MCG TABLET    Take 100 mcg by mouth daily.   PANTOPRAZOLE (PROTONIX) 40 MG TABLET    Take 1 tablet (40 mg total) by mouth daily at 12 noon.   POTASSIUM CHLORIDE SA (K-DUR,KLOR-CON) 20 MEQ TABLET    TAKE ONE TABLET EACH DAY   RIVAROXABAN (XARELTO) 20 MG TABS    Take 1 tablet (20 mg total) by mouth daily with breakfast.   SENNA-DOCUSATE (SENOKOT-S) 8.6-50 MG TABLET    Take 2 tablets  by mouth 2 (two) times daily.   TIOTROPIUM (SPIRIVA) 18 MCG INHALATION CAPSULE    Place 18 mcg into inhaler and inhale daily.  Modified Medications   No medications on file  Discontinued Medications   No medications on file    Review of Systems  Unable to perform ROS: Psychiatric disorder    Filed Vitals:   10/19/15 1113  BP: 112/76  Pulse: 106  Temp: 97 F (36.1 C)  Weight: 205 lb (92.987 kg)   Body mass index is 35.17 kg/(m^2).  Physical Exam  Constitutional: She appears well-developed and well-nourished. No distress. Nasal cannula in place.  No conversational dyspnea. Sitting in bed  HENT:  Oropharynx cobblestoning but no redness or exudate. MM dry. No oral thrush noted. She is wearing upper dentures  Neurological: She is alert.  Skin: Skin is warm and dry. No rash noted.  Psychiatric: Her speech is normal and behavior is normal. She exhibits a depressed mood.     Labs reviewed: Admission on 09/11/2015, Discharged on 10/03/2015  No results displayed because visit has over 200 results.      Dg Chest Port 1 View  10/03/2015  CLINICAL DATA:  Increased  Difficulty breathing EXAM: PORTABLE CHEST 1 VIEW COMPARISON:  09/27/2015 FINDINGS: Extubation. Removal of nasogastric tube. Cardiomegaly accentuated by AP portable technique. Atherosclerosis in the transverse aorta. No pleural effusion or pneumothorax. Patchy right upper and right lower lobe opacities persist. Underlying interstitial thickening is mild. No overt congestive failure. IMPRESSION:  Persistent right-sided patchy opacities, suspicious for infection. Cardiomegaly and aortic atherosclerosis, without congestive failure. Electronically Signed   By: Abigail Miyamoto M.D.   On: 10/03/2015 08:01   Dg Chest Port 1 View  09/27/2015  CLINICAL DATA:  Follow-up respiratory failure. EXAM: PORTABLE CHEST 1 VIEW COMPARISON:  09/26/2015 FINDINGS: Endotracheal tube unchanged with tip 2.5 cm above the carina. Nasogastric tube courses through the stomach as tip is not definitely visualized. Lungs are adequately inflated with mild stable prominence of the perihilar markings. Minimal patchy opacification over the lateral right upper lobe and right base without significant change as this may be due to ongoing infection. No evidence of effusion. Stable cardiomegaly. Remainder of the exam is unchanged. IMPRESSION: Subtle patchy density over the lateral right upper lobe and right base which may be due to infection. Stable cardiomegaly with suggestion of mild vascular congestion. Electronically Signed   By: Marin Olp M.D.   On: 09/27/2015 08:06   Dg Chest Port 1 View  09/26/2015  CLINICAL DATA:  Acute respiratory failure, CHF, COPD, sepsis, morbid obesity. EXAM: PORTABLE CHEST 1 VIEW COMPARISON:  Portable chest x-ray of September 25, 2015 FINDINGS: The lungs are reasonably well inflated. The interstitial markings remain coarse bilaterally. There is new atelectasis or early infiltrate at the lung bases. There is no pleural effusion or pneumothorax. The cardiac silhouette is enlarged. The pulmonary vascularity is not engorged. The endotracheal tube tip lies 2.6 cm above the carina. The esophagogastric tube tip projects below the inferior margin of the image. IMPRESSION: Interval development of subsegmental atelectasis at the lung bases. Stable cardiomegaly and bilateral pulmonary interstitial prominence. Electronically Signed   By: David  Martinique M.D.   On: 09/26/2015 07:38   Dg Chest Port 1 View  09/25/2015  CLINICAL  DATA:  Respiratory failure. EXAM: PORTABLE CHEST 1 VIEW COMPARISON:  Earlier today at 1701 hours FINDINGS: Endotracheal tube terminates approximately 15 mm above the carina. Nasogastric tube is poorly visualized distally but likely terminates at the distal  stomach. Numerous leads and wires project over the chest. Cardiomegaly accentuated by AP portable technique. No pleural effusion or pneumothorax. Low lung volumes with resultant pulmonary interstitial prominence. No lobar consolidation. Mild pulmonary venous congestion remains. IMPRESSION: Cardiomegaly and low lung volumes with persistent pulmonary venous congestion. Endotracheal tube terminating 1.5 cm above carina. Consider retraction 2-3 cm. Electronically Signed   By: Abigail Miyamoto M.D.   On: 09/25/2015 18:41   Dg Chest Port 1 View  09/25/2015  CLINICAL DATA:  Acute respiratory failure. Endotracheal tube placement. EXAM: PORTABLE CHEST 1 VIEW COMPARISON:  09/25/2015 FINDINGS: Endotracheal tube has been placed, tip overlying the level of the lower trachea, 1.6 cm above the carina. Consider withdrawing endotracheal tube 1-2 cm. The heart is enlarged. The left hemidiaphragm is elevated and stable. There is mild right lower lobe atelectasis. There has been some improvement of interstitial edema. Nasogastric tube is in place, tip in the stomach. IMPRESSION: 1. Interval placement of nasogastric tube ; placement of endotracheal tube with tip 1.6 cm above carina. 2. Mild improvement in pulmonary edema. Electronically Signed   By: Nolon Nations M.D.   On: 09/25/2015 17:47   Dg Chest Port 1 View  09/25/2015  CLINICAL DATA:  70 year old female with shortness of breath. EXAM: PORTABLE CHEST 1 VIEW COMPARISON:  09/22/2015 and prior exams FINDINGS: Cardiomegaly and bilateral interstitial opacities are unchanged. Mild bibasilar atelectasis again noted. There is no evidence of pneumothorax. No other interval changes identified. IMPRESSION: Unchanged appearance of the  chest with cardiomegaly, probable interstitial pulmonary edema and basilar atelectasis. Electronically Signed   By: Margarette Canada M.D.   On: 09/25/2015 09:00   Dg Chest Port 1 View  09/22/2015  CLINICAL DATA:  Dyspnea. EXAM: PORTABLE CHEST 1 VIEW COMPARISON:  09/14/2015. FINDINGS: Stable enlarged cardiac silhouette. Mild increase in prominence of the pulmonary vasculature and interstitial markings. No visible pleural fluid. Unremarkable bones. IMPRESSION: Mildly progressive changes of congestive heart failure. Electronically Signed   By: Claudie Revering M.D.   On: 09/22/2015 13:51   Dg Abd Portable 1v  09/25/2015  CLINICAL DATA:  Abdominal distention. EXAM: PORTABLE ABDOMEN - 1 VIEW COMPARISON:  None. FINDINGS: The stomach shows gaseous distention. There is a fairly large amount of fecal material throughout the colon without evidence of significant focal impaction or bowel obstruction. Some gas in nondilated small bowel loops may also be consistent with a mild component of small bowel ileus. No abnormal calcifications or soft tissue abnormalities identified. IMPRESSION: Gaseous distention of stomach. Diffuse fecal material throughout the colon. Gas in nondilated small bowel loops may be reflective of a mild component of small bowel ileus. Electronically Signed   By: Aletta Edouard M.D.   On: 09/25/2015 11:28     Assessment/Plan   ICD-9-CM ICD-10-CM   1. Depression 311 F32.9   2. Loss of appetite 783.0 R63.0   3. Chronic diastolic heart failure (HCC) 428.32 I50.32   4. Other emphysema ([place]) 492.8 J43.8    Start remeron 15mg  po qhs to help with appetite and depression  Cont other meds as ordered   f/u with Ortho as scheduled  Encouraged her to participate with any tx that is offered  Discussed with family present and all questions answered  Will follow  Kazue Cerro S. Perlie Gold  [facility] [address] [place], [place] [address] [phone]  Cell (Monday-Friday 8 AM - 5 PM) [phone] After 5 PM and follow prompts

## 2015-11-05 ENCOUNTER — Emergency Department (HOSPITAL_COMMUNITY)
Admission: EM | Admit: 2015-11-05 | Discharge: 2015-11-05 | Disposition: A | Discharge status: Home / Self Care | Payer: Medicare Other | Attending: Emergency Medicine | Admitting: Emergency Medicine

## 2015-11-05 ENCOUNTER — Emergency Department (HOSPITAL_COMMUNITY): Payer: Medicare Other

## 2015-11-05 ENCOUNTER — Encounter (HOSPITAL_COMMUNITY): Payer: Self-pay | Admitting: Emergency Medicine

## 2015-11-05 DIAGNOSIS — S0990XA Unspecified injury of head, initial encounter: Secondary | ICD-10-CM | POA: Insufficient documentation

## 2015-11-05 DIAGNOSIS — Y998 Other external cause status: Secondary | ICD-10-CM | POA: Diagnosis not present

## 2015-11-05 DIAGNOSIS — Z794 Long term (current) use of insulin: Secondary | ICD-10-CM | POA: Diagnosis not present

## 2015-11-05 DIAGNOSIS — S61511A Laceration without foreign body of right wrist, initial encounter: Secondary | ICD-10-CM | POA: Insufficient documentation

## 2015-11-05 DIAGNOSIS — W06XXXA Fall from bed, initial encounter: Secondary | ICD-10-CM | POA: Insufficient documentation

## 2015-11-05 DIAGNOSIS — Y9384 Activity, sleeping: Secondary | ICD-10-CM | POA: Diagnosis not present

## 2015-11-05 DIAGNOSIS — Y9389 Activity, other specified: Secondary | ICD-10-CM | POA: Diagnosis not present

## 2015-11-05 DIAGNOSIS — Z7901 Long term (current) use of anticoagulants: Secondary | ICD-10-CM | POA: Diagnosis not present

## 2015-11-05 DIAGNOSIS — E039 Hypothyroidism, unspecified: Secondary | ICD-10-CM | POA: Insufficient documentation

## 2015-11-05 DIAGNOSIS — I252 Old myocardial infarction: Secondary | ICD-10-CM | POA: Diagnosis not present

## 2015-11-05 DIAGNOSIS — I509 Heart failure, unspecified: Secondary | ICD-10-CM | POA: Diagnosis not present

## 2015-11-05 DIAGNOSIS — Y9289 Other specified places as the place of occurrence of the external cause: Secondary | ICD-10-CM | POA: Diagnosis not present

## 2015-11-05 DIAGNOSIS — F419 Anxiety disorder, unspecified: Secondary | ICD-10-CM | POA: Insufficient documentation

## 2015-11-05 DIAGNOSIS — Z8701 Personal history of pneumonia (recurrent): Secondary | ICD-10-CM | POA: Diagnosis not present

## 2015-11-05 DIAGNOSIS — Z79899 Other long term (current) drug therapy: Secondary | ICD-10-CM | POA: Insufficient documentation

## 2015-11-05 DIAGNOSIS — I4892 Unspecified atrial flutter: Secondary | ICD-10-CM | POA: Insufficient documentation

## 2015-11-05 DIAGNOSIS — K219 Gastro-esophageal reflux disease without esophagitis: Secondary | ICD-10-CM | POA: Diagnosis not present

## 2015-11-05 DIAGNOSIS — Z87891 Personal history of nicotine dependence: Secondary | ICD-10-CM | POA: Insufficient documentation

## 2015-11-05 DIAGNOSIS — J449 Chronic obstructive pulmonary disease, unspecified: Secondary | ICD-10-CM | POA: Diagnosis not present

## 2015-11-05 DIAGNOSIS — I10 Essential (primary) hypertension: Secondary | ICD-10-CM | POA: Insufficient documentation

## 2015-11-05 NOTE — Discharge Instructions (Signed)
Head Injury, Adult °You have a head injury. Headaches and throwing up (vomiting) are common after a head injury. It should be easy to wake up from sleeping. Sometimes you must stay in the hospital. Most problems happen within the first 24 hours. Side effects may occur up to 7-10 days after the injury.  °WHAT ARE THE TYPES OF HEAD INJURIES? °Head injuries can be as minor as a bump. Some head injuries can be more severe. More severe head injuries include: °· A jarring injury to the brain (concussion). °· A bruise of the brain (contusion). This mean there is bleeding in the brain that can cause swelling. °· A cracked skull (skull fracture). °· Bleeding in the brain that collects, clots, and forms a bump (hematoma). °WHEN SHOULD I GET HELP RIGHT AWAY?  °· You are confused or sleepy. °· You cannot be woken up. °· You feel sick to your stomach (nauseous) or keep throwing up (vomiting). °· Your dizziness or unsteadiness is getting worse. °· You have very bad, lasting headaches that are not helped by medicine. Take medicines only as told by your doctor. °· You cannot use your arms or legs like normal. °· You cannot walk. °· You notice changes in the black spots in the center of the colored part of your eye (pupil). °· You have clear or bloody fluid coming from your nose or ears. °· You have trouble seeing. °During the next 24 hours after the injury, you must stay with someone who can watch you. This person should get help right away (call 911 in the [place] if you start to shake and are not able to control it (have seizures), you pass out, or you are unable to wake up. °HOW CAN I PREVENT A HEAD INJURY IN THE FUTURE? °· Wear seat belts. °· Wear a helmet while bike riding and playing sports like football. °· Stay away from dangerous activities around the house. °WHEN CAN I RETURN TO NORMAL ACTIVITIES AND ATHLETICS? °See your doctor before doing these activities. You should not do normal activities or play contact sports until 1  week after the following symptoms have stopped: °· Headache that does not go away. °· Dizziness. °· Poor attention. °· Confusion. °· Memory problems. °· Sickness to your stomach or throwing up. °· Tiredness. °· Fussiness. °· Bothered by bright lights or loud noises. °· Anxiousness or depression. °· Restless sleep. °MAKE SURE YOU:  °· Understand these instructions. °· Will watch your condition. °· Will get help right away if you are not doing well or get worse. °  °This information is not intended to replace advice given to you by your health care provider. Make sure you discuss any questions you have with your health care provider. °  °Document Released: 08/22/2008 Document Revised: 09/30/2014 Document Reviewed: 05/17/2013 °Elsevier Interactive Patient Education ©2016 [employer] ° °

## 2015-11-05 NOTE — ED Provider Notes (Signed)
By signing my name below, I, Forrestine Him, attest that this documentation has been prepared under the direction and in the presence of [place], DO.  Electronically Signed: Forrestine Him, ED Scribe. 11/05/2015. 4:05 AM.   TIME SEEN: 4:04 AM   CHIEF COMPLAINT:  Chief Complaint  Patient presents with  . Fall     HPI:  HPI Comments: Caitlin Hampton brought in by EMS from [name] is a 70 y.o. female with a PMHx of A-Fib, HTN, NSTEMI, and CHF who presents to the Emergency Department here after a fall sustained just prior to arrival. Pt states she rolled out of her bed and landed on the ground which woke her from sleep. Pt takes she thinks she did hit her head but denies loss of consciousness. She denies any pain. Mild confusion also reported along with a skin tear to the R wrist per EMS. No confusion currently. Friend at bedside states patient is at her baseline. No interventions given en route to department. No recent fever, chills, nausea, vomiting, chest pain, or shortness of breath. Pt takes Xarelto daily. Caitlin Hampton wears 3 liters of Oxygen at home. Tetanus UTD- last year.  PCP: [name], MD    ROS: See HPI Constitutional: no fever  Eyes: no drainage  ENT: no runny nose   Cardiovascular:  no chest pain  Resp: no SOB  GI: no vomiting GU: no dysuria Integumentary: no rash. Positive wound Allergy: no hives  Musculoskeletal: no leg swelling. Positive neck pain  Neurological: no slurred speech. Positive confusion ROS otherwise negative  PAST MEDICAL HISTORY/PAST SURGICAL HISTORY:  Past Medical History  Diagnosis Date  . Hypertension   . Thyroid disease   . COPD (chronic obstructive pulmonary disease) ([place])   . Anxiety   . H/O hiatal hernia   . Hypothyroidism   . Shortness of breath   . Atrial flutter ([place]) 09/07/2012    , RVR/ hospital 12/13- rate control and Echo normal- on xarelto, when spontaneously converted she had bradycardia on Metoprolol and Digoxin  . GERD  (gastroesophageal reflux disease)   . Rhinitis   . Parotitis     , on CT of face- 12/13  . CHF (congestive heart failure) ([place])   . Hx of echocardiogram     Echo (8/15):  EF 60-65%  . NSTEMI (non-ST elevated myocardial infarction) ([place])   . CAP (community acquired pneumonia) 09/14/2015    MEDICATIONS:  Prior to Admission medications   Medication Sig Start Date End Date Taking? Authorizing Provider  acetaminophen (TYLENOL) 500 MG tablet Take 1,000 mg by mouth every 6 (six) hours as needed for mild pain.    Historical Provider, MD  albuterol (PROVENTIL) (2.5 MG/3ML) 0.083% nebulizer solution Take 2.5 mg by nebulization every 6 (six) hours as needed for wheezing or shortness of breath.    Historical Provider, MD  Amino Acids-Protein Hydrolys (FEEDING SUPPLEMENT, PRO-STAT SUGAR FREE 64,) LIQD Take 30 mLs by mouth 2 (two) times daily. 10/03/15   Silver Huguenin Elgergawy, MD  arformoterol (BROVANA) 15 MCG/2ML NEBU Take 2 mLs (15 mcg total) by nebulization 2 (two) times daily. 10/03/15   Silver Huguenin Elgergawy, MD  atorvastatin (LIPITOR) 10 MG tablet Take 10 mg by mouth daily.  05/03/14   Historical Provider, MD  bisacodyl (DULCOLAX) 10 MG suppository Place 1 suppository (10 mg total) rectally daily as needed for moderate constipation. 10/03/15   Silver Huguenin Elgergawy, MD  bisacodyl (DULCOLAX) 5 MG EC tablet Take 2 tablets (10 mg total) by  mouth daily as needed for moderate constipation. 10/03/15   Silver Huguenin Elgergawy, MD  budesonide (PULMICORT) 0.25 MG/2ML nebulizer solution Take 2 mLs (0.25 mg total) by nebulization 2 (two) times daily. 12/16/14   Barton Dubois, MD  cloNIDine (CATAPRES) 0.1 MG tablet Take 1 tablet (0.1 mg total) by mouth 2 (two) times daily. 10/03/15   Silver Huguenin Elgergawy, MD  diltiazem (CARDIZEM CD) 180 MG 24 hr capsule Take 1 capsule (180 mg total) by mouth daily. 10/03/15   Silver Huguenin Elgergawy, MD  furosemide (LASIX) 40 MG tablet Take 1.5 tablets (60 mg total) by mouth 2 (two) times daily. 10/03/15    Silver Huguenin Elgergawy, MD  insulin aspart (NOVOLOG) 100 UNIT/ML injection Inject 0-15 Units into the skin 3 (three) times daily with meals. 10/03/15   Silver Huguenin Elgergawy, MD  iron polysaccharides (NIFEREX) 150 MG capsule Take 1 capsule (150 mg total) by mouth 2 (two) times daily. 12/16/14   Barton Dubois, MD  levothyroxine (SYNTHROID, LEVOTHROID) 100 MCG tablet Take 100 mcg by mouth daily.    Historical Provider, MD  pantoprazole (PROTONIX) 40 MG tablet Take 1 tablet (40 mg total) by mouth daily at 12 noon. 10/03/15   Silver Huguenin Elgergawy, MD  potassium chloride SA (K-DUR,KLOR-CON) 20 MEQ tablet TAKE ONE TABLET EACH DAY Patient taking differently: take one half tablet each day with furosemide 06/29/15   Jerline Pain, MD  Rivaroxaban (XARELTO) 20 MG TABS Take 1 tablet (20 mg total) by mouth daily with breakfast. 09/12/12   Lavone Orn, MD  senna-docusate (SENOKOT-S) 8.6-50 MG tablet Take 2 tablets by mouth 2 (two) times daily. 10/03/15   Silver Huguenin Elgergawy, MD  tiotropium (SPIRIVA) 18 MCG inhalation capsule Place 18 mcg into inhaler and inhale daily.    Historical Provider, MD    ALLERGIES:  No Known Allergies  SOCIAL HISTORY:  Social History  Substance Use Topics  . Smoking status: Former Smoker -- 1.50 packs/day    Types: Cigarettes    Quit date: 07/23/2012  . Smokeless tobacco: Not on file  . Alcohol Use: No     Comment: occassionally    FAMILY HISTORY: Family History  Problem Relation Age of Onset  . CAD Mother   . Stroke Mother   . Prostate cancer Father   . Dementia Father     EXAM: BP 163/89 mmHg  Pulse 107  Temp(Src) 98.5 F (36.9 C) (Oral)  Resp 21  Ht 5\' 5"  (1.651 m)  Wt 205 lb (92.987 kg)  BMI 34.11 kg/m2  SpO2 97% CONSTITUTIONAL: Alert and oriented and responds appropriately to questions. Well-appearing; well-nourished; GCS 38, elderly HEAD: Normocephalic; atraumatic EYES: Conjunctivae clear, PERRL, EOMI ENT: normal nose; no rhinorrhea; moist mucous membranes;  pharynx without lesions noted; no dental injury; no septal hematoma; TMs are clear bilaterally without bulging, purulence, erythema, hemotympanum, effusion, no signs of mastoiditis, no pain with manipulation of the pinna bilaterally, no Battle sign or raccoon eyes NECK: Supple, no meningismus, no LAD; no midline spinal tenderness, step-off or deformity CARD: RRR; S1 and S2 appreciated; no murmurs, no clicks, no rubs, no gallops RESP: Normal chest excursion without splinting or tachypnea; breath sounds clear and equal bilaterally; no wheezes, no rhonchi, no rales; no hypoxia or respiratory distress CHEST:  chest wall stable, no crepitus or ecchymosis or deformity, nontender to palpation ABD/GI: Normal bowel sounds; non-distended; soft, non-tender, no rebound, no guarding PELVIS:  stable, nontender to palpation BACK:  The back appears normal and is non-tender to palpation, there  is no CVA tenderness; no midline spinal tenderness, step-off or deformity EXT: Normal ROM in all joints; non-tender to palpation; no edema; normal capillary refill; no cyanosis, no bony tenderness or bony deformity of patient's extremities, no joint effusion, ecchymosis to bilateral upper extreme is which she reports is chronic, small skin tear to the right dorsal hand SKIN: Normal color for age and race; warm NEURO: Moves all extremities equally, sensation to light touch intact diffusely, cranial nerves II through XII intact PSYCH: The patient's mood and manner are appropriate. Grooming and personal hygiene are appropriate.  MEDICAL DECISION MAKING: Patient here with fall out of bed. Reports that she just rolled out of bed and states she did hit her head. Was initially having neck pain but has none currently. Family reports that she was briefly confused afterwards and talking to her mother who was not present in the room at the time but is now back to her baseline and has no neurologic deficits. She has no current complaints. She  had an EKG performed which appeared abnormal but it appears that lead placement was incorrect. Repeat EKG show no ischemic changes. She is in atrial fibrillation which is chronic and on Xarelto. Will obtain head and cervical spine CT. Does have a small skin tear to the right hand. Reports her last tetanus was in the last 1-2 years.  ED PROGRESS: Patient is still at her baseline without complaints. Head and cervical spine CT showed no acute abnormality from her fall. CT scan shows opacification of the right mastoid air cells and a small left fluid in the right middle ear. She has no tenderness over the mastoid process, erythema or warmth. No tenderness with manipulation of the right ear. Doubt acute mastoiditis. No sign of otitis media, otitis externa. No hemotympanum. I feel she is safe to be discharged home. Discussed head injury return precautions. Patient and friend at bedside verbalize understanding and are comfortable with plan. Patient lives with this friend 24/7.     EKG Interpretation  Date/Time:  Sunday November 05 2015 03:04:02 EST Ventricular Rate:  100 PR Interval:    QRS Duration: 91 QT Interval:  322 QTC Calculation: 415 R Axis:   -31 Text Interpretation:  Atrial fibrillation Left axis deviation Borderline low voltage, extremity leads ST depr, consider ischemia, anterior leads ST depression V1-V3, suggest recording posterior leads No significant change since previous EKG Confirmed by WARD,  DO, KRISTEN [id number]) on 11/05/2015 3:56:55 AM         EKG Interpretation  Date/Time:  Sunday November 05 2015 04:06:19 EST Ventricular Rate:  101 PR Interval:    QRS Duration: 85 QT Interval:  443 QTC Calculation: 574 R Axis:   -17 Text Interpretation:  Atrial fibrillation Borderline left axis deviation Repol abnrm suggests ischemia, diffuse leads Prolonged QT interval No significant change since last tracing Confirmed by WARD,  DO, KRISTEN [phone]) on 11/05/2015 5:32:52 AM       EKG  Interpretation  Date/Time:  Sunday November 05 2015 04:18:18 EST Ventricular Rate:  81 PR Interval:    QRS Duration: 101 QT Interval:  371 QTC Calculation: 431 R Axis:   -25 Text Interpretation:  Atrial fibrillation Borderline left axis deviation Nonspecific repol abnormality, diffuse leads Minimal ST elevation, lateral leads Artifact Posterior EKG Confirmed by WARD,  DO, KRISTEN [id number]) on 11/05/2015 5:34:12 AM        I personally performed the services described in this documentation, which was scribed in my presence. The recorded information has  been reviewed and is accurate.   [place], DO 11/05/15 [phone]

## 2015-11-05 NOTE — ED Notes (Signed)
Pt arrives via EMS from [place] with c/o falling out of bed, states fall woke her up. No LOC. Reports neck pain so ccollar in place. Per EMS report pt was briefly confused, talking to mother who was not present in the room at that time. Skin tear R wrist. No head trauma.

## 2015-11-05 NOTE — ED Notes (Signed)
Pt in AFIB at this time, hx of the same - taking xarelto. Wears 3L o2 at home. Denies pain at this time, initially reporting pain all over.

## 2015-11-15 ENCOUNTER — Non-Acute Institutional Stay (SKILLED_NURSING_FACILITY): Payer: Medicare Other | Admitting: Nurse Practitioner

## 2015-11-15 ENCOUNTER — Encounter: Payer: Self-pay | Admitting: Nurse Practitioner

## 2015-11-15 DIAGNOSIS — I509 Heart failure, unspecified: Secondary | ICD-10-CM

## 2015-11-15 DIAGNOSIS — F32A Depression, unspecified: Secondary | ICD-10-CM

## 2015-11-15 DIAGNOSIS — R630 Anorexia: Secondary | ICD-10-CM

## 2015-11-15 DIAGNOSIS — J438 Other emphysema: Secondary | ICD-10-CM | POA: Diagnosis not present

## 2015-11-15 DIAGNOSIS — D509 Iron deficiency anemia, unspecified: Secondary | ICD-10-CM | POA: Diagnosis not present

## 2015-11-15 DIAGNOSIS — I10 Essential (primary) hypertension: Secondary | ICD-10-CM

## 2015-11-15 DIAGNOSIS — F329 Major depressive disorder, single episode, unspecified: Secondary | ICD-10-CM | POA: Diagnosis not present

## 2015-11-15 NOTE — Progress Notes (Signed)
Patient ID: Caitlin Hampton, female   DOB: 03-Mar-1946, 70 y.o.   MRN: [id number]    Nursing Home Location:  [facility]   Place of Service: SNF (31)  PCP: [name], MD  No Known Allergies  Chief Complaint  Patient presents with  . Medical Management of Chronic Issues    Routine Visit    HPI:  Patient is a 70 y.o. female seen today at [facility] for routine follow up on chronic conditions. Pt with hx of HTN, CHF, a fib, hypothyroidism, GERD. Pt at [facility] after hospitalization for COPD exacerbation, sepsis, CAP, fall with right leg hematoma. Since pt has been at [facility] she has not been eating much due to decrease appetite. Staying in the bed most of the time. Pt was seen last month and started on remeron. Pt has not had much increase in appetite and has had ongoing weight loss. Pt denies trouble swallowing, pain in mouth. No constipation or diarrhea. Reports she is just not hungry. Pt denies worsening shortness of breath or chest pains.   Review of Systems:  Review of Systems  Constitutional: Positive for unexpected weight change (weight loss due to decrease appetite). Negative for activity change, appetite change and fatigue.  HENT: Negative for congestion and hearing loss.   Eyes: Negative.   Respiratory: Negative for cough and shortness of breath.   Cardiovascular: Negative for chest pain, palpitations and leg swelling.  Gastrointestinal: Negative for abdominal pain, diarrhea and constipation.  Genitourinary: Negative for dysuria and difficulty urinating.  Musculoskeletal: Negative for myalgias and arthralgias.  Skin: Positive for wound (right lateral leg and right heel with eschar- followed by wound care). Negative for color change.  Neurological: Negative for dizziness and weakness.  Psychiatric/Behavioral: Negative for behavioral problems, confusion and agitation.       Pt denies depression or anxiety    Past Medical History  Diagnosis  Date  . Hypertension   . Thyroid disease   . COPD (chronic obstructive pulmonary disease) ([place])   . Anxiety   . H/O hiatal hernia   . Hypothyroidism   . Shortness of breath   . Atrial flutter ([place]) 09/07/2012    , RVR/ hospital 12/13- rate control and Echo normal- on xarelto, when spontaneously converted she had bradycardia on Metoprolol and Digoxin  . GERD (gastroesophageal reflux disease)   . Rhinitis   . Parotitis     , on CT of face- 12/13  . CHF (congestive heart failure) ([place])   . Hx of echocardiogram     Echo (8/15):  EF 60-65%  . NSTEMI (non-ST elevated myocardial infarction) ([place])   . CAP (community acquired pneumonia) 09/14/2015   Past Surgical History  Procedure Laterality Date  . Tonsillectomy    . Breast surgery      benign R brest CA  . Cardioversion N/A 04/02/2014    Procedure: CARDIOVERSION;  Surgeon: Josue Hector, MD;  Location: [place];  Service: Cardiovascular;  Laterality: N/A;  . Cardioversion N/A 08/17/2014    Procedure: CARDIOVERSION;  Surgeon: [name], MD;  Location: [place];  Service: Cardiovascular;  Laterality: N/A;  . I&d extremity Right 09/18/2015    Procedure: IRRIGATION AND DEBRIDEMENT RIGHT LOWER LEG.;  Surgeon: Mcarthur Rossetti, MD;  Location: [place];  Service: Orthopedics;  Laterality: Right;  . Application of wound vac Right 09/18/2015    Procedure: APPLICATION OF WOUND VAC RIGHT LOWER LEG;  Surgeon: Mcarthur Rossetti, MD;  Location: [place];  Service:  Orthopedics;  Laterality: Right;  . Skin split graft Right 09/23/2015    Procedure: SKIN GRAFT SPLIT THICKNESS RIGHT LEG;  Surgeon: Mcarthur Rossetti, MD;  Location: [place];  Service: Orthopedics;  Laterality: Right;  Right Thigh   Social History:   reports that she quit smoking about 3 years ago. Her smoking use included Cigarettes. She smoked 1.50 packs per day. She does not have any smokeless tobacco history on file. She reports that she does not drink alcohol or use illicit  drugs.  Family History  Problem Relation Age of Onset  . CAD Mother   . Stroke Mother   . Prostate cancer Father   . Dementia Father     Medications: Patient's Medications  New Prescriptions   No medications on file  Previous Medications   ACETAMINOPHEN (TYLENOL) 500 MG TABLET    Take 1,000 mg by mouth every 6 (six) hours as needed for mild pain.   ALBUTEROL (PROVENTIL) (2.5 MG/3ML) 0.083% NEBULIZER SOLUTION    Take 2.5 mg by nebulization every 6 (six) hours as needed for wheezing or shortness of breath.   AMINO ACIDS-PROTEIN HYDROLYS (FEEDING SUPPLEMENT, PRO-STAT SUGAR FREE 64,) LIQD    Take 30 mLs by mouth 2 (two) times daily.   ARFORMOTEROL (BROVANA) 15 MCG/2ML NEBU    Take 2 mLs (15 mcg total) by nebulization 2 (two) times daily.   ATORVASTATIN (LIPITOR) 10 MG TABLET    Take 10 mg by mouth daily.    BISACODYL (DULCOLAX) 10 MG SUPPOSITORY    Place 1 suppository (10 mg total) rectally daily as needed for moderate constipation.   BISACODYL (DULCOLAX) 5 MG EC TABLET    Take 2 tablets (10 mg total) by mouth daily as needed for moderate constipation.   BUDESONIDE (PULMICORT) 0.25 MG/2ML NEBULIZER SOLUTION    Take 2 mLs (0.25 mg total) by nebulization 2 (two) times daily.   CLONIDINE (CATAPRES) 0.1 MG TABLET    Take 1 tablet (0.1 mg total) by mouth 2 (two) times daily.   DILTIAZEM (CARDIZEM CD) 180 MG 24 HR CAPSULE    Take 1 capsule (180 mg total) by mouth daily.   FUROSEMIDE (LASIX) 40 MG TABLET    Take 1.5 tablets (60 mg total) by mouth 2 (two) times daily.   IRON POLYSACCHARIDES (NIFEREX) 150 MG CAPSULE    Take 1 capsule (150 mg total) by mouth 2 (two) times daily.   LEVOTHYROXINE (SYNTHROID, LEVOTHROID) 100 MCG TABLET    Take 100 mcg by mouth daily.   MIRTAZAPINE (REMERON) 15 MG TABLET    Take 15 mg by mouth at bedtime.   PANTOPRAZOLE (PROTONIX) 40 MG TABLET    Take 1 tablet (40 mg total) by mouth daily at 12 noon.   POTASSIUM CHLORIDE SA (K-DUR,KLOR-CON) 20 MEQ TABLET    TAKE ONE  TABLET EACH DAY   RIVAROXABAN (XARELTO) 20 MG TABS    Take 1 tablet (20 mg total) by mouth daily with breakfast.   SENNA-DOCUSATE (SENOKOT-S) 8.6-50 MG TABLET    Take 2 tablets by mouth 2 (two) times daily.   TIOTROPIUM (SPIRIVA) 18 MCG INHALATION CAPSULE    Place 18 mcg into inhaler and inhale daily.  Modified Medications   No medications on file  Discontinued Medications   INSULIN ASPART (NOVOLOG) 100 UNIT/ML INJECTION    Inject 0-15 Units into the skin 3 (three) times daily with meals.   POTASSIUM CHLORIDE (K-DUR) 10 MEQ TABLET    Take 10 mEq by mouth daily.  Physical Exam: Filed Vitals:   11/15/15 1017  BP: 131/67  Pulse: 78  Temp: 97.3 F (36.3 C)  TempSrc: Oral  Resp: 16  Height: 5\' 5"  (1.651 m)  Weight: 181 lb 12.8 oz (82.464 kg)    Physical Exam  Constitutional: She is oriented to person, place, and time. She appears well-developed and well-nourished. No distress. Nasal cannula in place.  No conversational dyspnea. Sitting in bed  HENT:  Mouth/Throat: Oropharynx is clear and moist. No oropharyngeal exudate.  Mucous membranes dry. No oral thrush noted. She is wearing dentures  Neck: Normal range of motion. Neck supple.  Cardiovascular: Normal rate, regular rhythm and normal heart sounds.   Pulmonary/Chest: Effort normal and breath sounds normal. No respiratory distress.  Abdominal: Bowel sounds are normal. She exhibits no distension.  Musculoskeletal: She exhibits no edema or tenderness.  Neurological: She is alert and oriented to person, place, and time.  Skin: Skin is warm and dry. No rash noted.  Psychiatric: Her speech is normal and behavior is normal. She exhibits a depressed mood.    Labs reviewed: Basic Metabolic Panel:  Recent Labs  12/13/14 1726  09/27/15 0414  09/30/15 0511 10/01/15 0230 10/02/15 0410 10/06/15  NA 138  < > 141  < > 141 139 141 140  K 4.5  < > 4.0  < > 4.0 4.1 3.8 3.6  CL 102  < > 94*  < > 95* 94* 90*  --   CO2 25  < > 37*  < >  37* 37* 44*  --   GLUCOSE 85  < > 130*  < > 109* 94 104*  --   BUN 43*  < > 36*  < > 53* 44* 35* 36*  CREATININE 1.51*  < > 0.97  < > 1.04* 0.89 0.93 1.0  CALCIUM 8.5  < > 8.8*  < > 8.6* 8.3* 8.3*  --   MG 2.7*  --  2.4  --   --  2.2  --   --   < > = values in this interval not displayed. Liver Function Tests:  Recent Labs  12/12/14 0812 09/16/15 0452 09/19/15 0824  AST 66* 24 26  ALT 70* 24 28  ALKPHOS 87 103 76  BILITOT 0.9 0.9 0.8  PROT 6.8 5.5* 5.3*  ALBUMIN 3.6 2.7* 2.7*   No results for input(s): LIPASE, AMYLASE in the last 8760 hours. No results for input(s): AMMONIA in the last 8760 hours. CBC:  Recent Labs  12/12/14 0812  09/11/15 1527 09/11/15 1944  09/30/15 0511 10/01/15 0230 10/02/15 0410 10/06/15  WBC 14.1*  < > 11.5* 10.5  < > 12.0* 12.8* 11.0* 15.2  NEUTROABS 13.3*  --  9.9* 10.1*  --   --   --   --   --   HGB 8.8*  < > 8.9* 8.4*  < > 8.4* 9.3* 9.1* 9.5*  HCT 29.2*  < > 32.0* 30.8*  < > 29.8* 33.5* 33.0* 31*  MCV 93.0  < > 80.8 80.6  < > 89.2 90.5 90.4  --   PLT 209  < > 335 272  < > 209 182 226 295  < > = values in this interval not displayed. TSH:  Recent Labs  12/11/14 1958 09/14/15 1320  TSH 0.632 1.984   A1C: Lab Results  Component Value Date   HGBA1C 5.8* 09/07/2012   Lipid Panel: No results for input(s): CHOL, HDL, LDLCALC, TRIG, CHOLHDL, LDLDIRECT in the last 8760 hours.  Assessment/Plan 1. Congestive heart failure, unspecified congestive heart failure chronicity, unspecified congestive heart failure type (HCC) -euvolemic, conts on lasix  2. Essential hypertension - blood pressure stable, conts on clonidine, cardizem  3. Other emphysema ([place]) COPD remains stable, without recent flare, conts on 3L [place], taking brovana, pulmicort, spiriva. Cont current regimen  4. Anemia, iron deficiency hgb stable, to cont supplement  5. Loss of appetite -remeron was added last month. Pt has not had much increase in appetite. Staying in the  bed most of the time. Encourage to get OOB for meals. Pt reports she is willing to get up for 1 meal at this time.   6. Depressed mood  Denies depression but has withdrawn affect. Does not get oob, does not have reason for this. TSH normal in December. Will get Vit d level  7. leukocytosis  -no signs of infection noted and leukocytosis noted in January, will follow up CBC with diff at this time.   8. FTT Family concerned over decrease appetite, weight loss, and no motivation, requesting pallative care consult. With all other co-morbities along with FTT will get palliative care consults, will also get get psych consult     Caitlin Hampton K. Harle Battiest  [facility] [phone] 8 am - 5 pm) [phone] (after hours)

## 2015-11-17 LAB — CBC AND DIFFERENTIAL
HEMATOCRIT: 36 % (ref 36–46)
Hemoglobin: 11.2 g/dL — AB (ref 12.0–16.0)
PLATELETS: 277 10*3/uL (ref 150–399)
WBC: 10.1 10^3/mL

## 2015-12-21 ENCOUNTER — Non-Acute Institutional Stay (SKILLED_NURSING_FACILITY): Payer: Medicare Other | Admitting: Adult Health

## 2015-12-21 ENCOUNTER — Encounter: Payer: Self-pay | Admitting: Adult Health

## 2015-12-21 DIAGNOSIS — E890 Postprocedural hypothyroidism: Secondary | ICD-10-CM

## 2015-12-21 DIAGNOSIS — E669 Obesity, unspecified: Secondary | ICD-10-CM | POA: Diagnosis not present

## 2015-12-21 DIAGNOSIS — I4892 Unspecified atrial flutter: Secondary | ICD-10-CM

## 2015-12-21 DIAGNOSIS — J438 Other emphysema: Secondary | ICD-10-CM | POA: Diagnosis not present

## 2015-12-21 DIAGNOSIS — I5032 Chronic diastolic (congestive) heart failure: Secondary | ICD-10-CM

## 2015-12-21 DIAGNOSIS — I10 Essential (primary) hypertension: Secondary | ICD-10-CM | POA: Diagnosis not present

## 2015-12-21 DIAGNOSIS — I82499 Acute embolism and thrombosis of other specified deep vein of unspecified lower extremity: Secondary | ICD-10-CM

## 2015-12-21 NOTE — Progress Notes (Signed)
Patient ID: Caitlin Hampton, female   DOB: 04-26-1946, 70 y.o.   MRN: [id number]   Facility: [name]       No Known Allergies  Chief Complaint  Patient presents with  . Medical Management of Chronic Issues     HPI:  She is a long term resident of this facility being seen for the management of her chronic illnesses. Overall her status is stable. She is losing weight from 209 pounds in Jan to her current weight of 171 pounds. She tells me that she is happy with her weight loss and has been trying to lose weight. She is not voicing any complaints or concerns at this time.   Past Medical History  Diagnosis Date  . Hypertension   . Thyroid disease   . COPD (chronic obstructive pulmonary disease) ([place])   . Anxiety   . H/O hiatal hernia   . Hypothyroidism   . Shortness of breath   . Atrial flutter ([place]) 09/07/2012    , RVR/ hospital 12/13- rate control and Echo normal- on xarelto, when spontaneously converted she had bradycardia on Metoprolol and Digoxin  . GERD (gastroesophageal reflux disease)   . Rhinitis   . Parotitis     , on CT of face- 12/13  . CHF (congestive heart failure) ([place])   . Hx of echocardiogram     Echo (8/15):  EF 60-65%  . NSTEMI (non-ST elevated myocardial infarction) ([place])   . CAP (community acquired pneumonia) 09/14/2015    Past Surgical History  Procedure Laterality Date  . Tonsillectomy    . Breast surgery      benign R brest CA  . Cardioversion N/A 04/02/2014    Procedure: CARDIOVERSION;  Surgeon: Josue Hector, MD;  Location: [facility];  Service: Cardiovascular;  Laterality: N/A;  . Cardioversion N/A 08/17/2014    Procedure: CARDIOVERSION;  Surgeon: [name], MD;  Location: [place];  Service: Cardiovascular;  Laterality: N/A;  . I&d extremity Right 09/18/2015    Procedure: IRRIGATION AND DEBRIDEMENT RIGHT LOWER LEG.;  Surgeon: Mcarthur Rossetti, MD;  Location: [facility];  Service: Orthopedics;  Laterality: Right;  . Application of wound vac  Right 09/18/2015    Procedure: APPLICATION OF WOUND VAC RIGHT LOWER LEG;  Surgeon: Mcarthur Rossetti, MD;  Location: [facility];  Service: Orthopedics;  Laterality: Right;  . Skin split graft Right 09/23/2015    Procedure: SKIN GRAFT SPLIT THICKNESS RIGHT LEG;  Surgeon: Mcarthur Rossetti, MD;  Location: [facility];  Service: Orthopedics;  Laterality: Right;  Right Thigh    VITAL SIGNS BP 126/76 mmHg  Pulse 109  Temp(Src) 98 F (36.7 C) (Oral)  Resp 20  Ht 5\' 5"  (1.651 m)  Wt 171 lb 6 oz (77.735 kg)  BMI 28.52 kg/m2  Patient's Medications  New Prescriptions   No medications on file  Previous Medications   ACETAMINOPHEN (TYLENOL) 500 MG TABLET    Take 1,000 mg by mouth every 6 (six) hours as needed for mild pain.   ALBUTEROL (PROVENTIL) (2.5 MG/3ML) 0.083% NEBULIZER SOLUTION    Take 2.5 mg by nebulization every 6 (six) hours as needed for wheezing or shortness of breath.   AMINO ACIDS-PROTEIN HYDROLYS (FEEDING SUPPLEMENT, PRO-STAT SUGAR FREE 64,) LIQD    Take 30 mLs by mouth 2 (two) times daily.   ARFORMOTEROL (BROVANA) 15 MCG/2ML NEBU    Take 2 mLs (15 mcg total) by nebulization 2 (two) times daily.   ATORVASTATIN (LIPITOR) 10 MG TABLET  Take 10 mg by mouth daily.    BISACODYL (DULCOLAX) 10 MG SUPPOSITORY    Place 1 suppository (10 mg total) rectally daily as needed for moderate constipation.   BISACODYL (DULCOLAX) 5 MG EC TABLET    Take 2 tablets (10 mg total) by mouth daily as needed for moderate constipation.   BUDESONIDE (PULMICORT) 0.25 MG/2ML NEBULIZER SOLUTION    Take 2 mLs (0.25 mg total) by nebulization 2 (two) times daily.   CLONIDINE (CATAPRES) 0.1 MG TABLET    Take 1 tablet (0.1 mg total) by mouth 2 (two) times daily.   DILTIAZEM (CARDIZEM CD) 180 MG 24 HR CAPSULE    Take 1 capsule (180 mg total) by mouth daily.   FUROSEMIDE (LASIX) 40 MG TABLET    Take 1.5 tablets (60 mg total) by mouth 2 (two) times daily.   IRON POLYSACCHARIDES (NIFEREX) 150 MG CAPSULE    Take 1 capsule  (150 mg total) by mouth 2 (two) times daily.   LEVOTHYROXINE (SYNTHROID, LEVOTHROID) 100 MCG TABLET    Take 100 mcg by mouth daily.   MIRTAZAPINE (REMERON) 15 MG TABLET    Take 15 mg by mouth at bedtime.   PANTOPRAZOLE (PROTONIX) 40 MG TABLET    Take 1 tablet (40 mg total) by mouth daily at 12 noon.   POTASSIUM CHLORIDE SA (K-DUR,KLOR-CON) 20 MEQ TABLET    TAKE ONE TABLET EACH DAY   RIVAROXABAN (XARELTO) 20 MG TABS    Take 1 tablet (20 mg total) by mouth daily with breakfast.   SENNA-DOCUSATE (SENOKOT-S) 8.6-50 MG TABLET    Take 2 tablets by mouth 2 (two) times daily.   TIOTROPIUM (SPIRIVA) 18 MCG INHALATION CAPSULE    Place 18 mcg into inhaler and inhale daily.  Modified Medications   No medications on file  Discontinued Medications   No medications on file     SIGNIFICANT DIAGNOSTIC EXAMS   LABS REVIEWED:   10-06-15: wbc 15.2; hgb 9.5; hct 31.4; mcv 85.3 ;plt 295; glucose 118; bun 36; creat 1.0; k+ 3.6; na++140 11-16-15: wbc 10.1; hgb 11.2; hct 35.9; mcv 81.8; plt 277; vit D 6    Review of Systems  Constitutional: Positive for weight loss. Negative for malaise/fatigue.       Weight loss is intentional   Respiratory: Negative for cough and shortness of breath.   Cardiovascular: Negative for chest pain, palpitations and leg swelling.  Gastrointestinal: Negative for heartburn, abdominal pain and constipation.  Musculoskeletal: Negative for myalgias, back pain and joint pain.  Skin: Negative.   Neurological: Negative for dizziness.  Psychiatric/Behavioral: The patient is not nervous/anxious.     Physical Exam  Constitutional: She is oriented to person, place, and time. No distress.  Overweight   Eyes: Conjunctivae are normal.  Neck: Neck supple. No JVD present. No thyromegaly present.  Cardiovascular: Normal rate, regular rhythm and intact distal pulses.   Respiratory: Effort normal and breath sounds normal. No respiratory distress. She has no wheezes.  02 dependent   GI:  Soft. Bowel sounds are normal. She exhibits no distension. There is no tenderness.  Musculoskeletal: She exhibits no edema.  Able to move all extremities   Lymphadenopathy:    She has no cervical adenopathy.  Neurological: She is alert and oriented to person, place, and time.  Skin: Skin is warm and dry. She is not diaphoretic.  Psychiatric: She has a normal mood and affect.      ASSESSMENT/ PLAN:  1. Chronic systolic heart failure: will continue lasix 60  mg twice daily with k+ 20 meq daily   2. COPD: 02 dependent; will continue bronova 15 mcg neb twice daily ; pulmicort neb twice daily ;spiriva daily; and albuterol neb every 6 hours as needed  3. Hypothyroidism: status post radioiodine therapy: will continue synthroid 100 mcg daily   4. Anemia: will continue niferex 150 mg twice daily   5. Gerd: will continue protonix 40 mg daily   6. Dyslipidemia: will continue lipitor 10 mg daily  7. Hypertension: will continue clonidine 0.1 mg twice daily cardizem cd 180 mg daily   8. Atrial flutter: heart rate stable; will continue xarelto 20 mg daily and takes cardizem cd 180 mg daily for rate control   9. Constipation: will continue senna s 2 tabs twice daily   10. DVT; stable will continue xarelto 20 mg daily   11. Obesity; she is telling me that she is trying to lose weight; will monitor  12. Depression: will continue remeron 15 mg nightly      Ok Edwards NP [facility]  Contact [phone] Monday through Friday 8am- 5pm  After hours call [phone]

## 2016-01-07 ENCOUNTER — Encounter (HOSPITAL_COMMUNITY): Payer: Self-pay

## 2016-01-07 ENCOUNTER — Inpatient Hospital Stay (HOSPITAL_COMMUNITY)
Admission: EM | Admit: 2016-01-07 | Discharge: 2016-01-12 | DRG: 871 | Disposition: A | Discharge status: Skilled Nursing Facility | Payer: Medicare Other | Attending: Internal Medicine | Admitting: Internal Medicine

## 2016-01-07 ENCOUNTER — Emergency Department (HOSPITAL_COMMUNITY): Payer: Medicare Other

## 2016-01-07 ENCOUNTER — Inpatient Hospital Stay (HOSPITAL_COMMUNITY): Payer: Medicare Other

## 2016-01-07 DIAGNOSIS — J441 Chronic obstructive pulmonary disease with (acute) exacerbation: Secondary | ICD-10-CM | POA: Diagnosis present

## 2016-01-07 DIAGNOSIS — Z515 Encounter for palliative care: Secondary | ICD-10-CM | POA: Insufficient documentation

## 2016-01-07 DIAGNOSIS — B3781 Candidal esophagitis: Secondary | ICD-10-CM | POA: Diagnosis present

## 2016-01-07 DIAGNOSIS — K219 Gastro-esophageal reflux disease without esophagitis: Secondary | ICD-10-CM | POA: Diagnosis present

## 2016-01-07 DIAGNOSIS — Y95 Nosocomial condition: Secondary | ICD-10-CM | POA: Diagnosis present

## 2016-01-07 DIAGNOSIS — E039 Hypothyroidism, unspecified: Secondary | ICD-10-CM | POA: Diagnosis present

## 2016-01-07 DIAGNOSIS — J449 Chronic obstructive pulmonary disease, unspecified: Secondary | ICD-10-CM | POA: Diagnosis present

## 2016-01-07 DIAGNOSIS — E669 Obesity, unspecified: Secondary | ICD-10-CM | POA: Diagnosis present

## 2016-01-07 DIAGNOSIS — A411 Sepsis due to other specified staphylococcus: Secondary | ICD-10-CM | POA: Diagnosis not present

## 2016-01-07 DIAGNOSIS — E872 Acidosis, unspecified: Secondary | ICD-10-CM

## 2016-01-07 DIAGNOSIS — J690 Pneumonitis due to inhalation of food and vomit: Secondary | ICD-10-CM | POA: Diagnosis present

## 2016-01-07 DIAGNOSIS — R634 Abnormal weight loss: Secondary | ICD-10-CM | POA: Diagnosis present

## 2016-01-07 DIAGNOSIS — I110 Hypertensive heart disease with heart failure: Secondary | ICD-10-CM | POA: Diagnosis present

## 2016-01-07 DIAGNOSIS — Z7901 Long term (current) use of anticoagulants: Secondary | ICD-10-CM | POA: Diagnosis not present

## 2016-01-07 DIAGNOSIS — R7989 Other specified abnormal findings of blood chemistry: Secondary | ICD-10-CM | POA: Diagnosis present

## 2016-01-07 DIAGNOSIS — Z66 Do not resuscitate: Secondary | ICD-10-CM | POA: Diagnosis not present

## 2016-01-07 DIAGNOSIS — K5900 Constipation, unspecified: Secondary | ICD-10-CM | POA: Insufficient documentation

## 2016-01-07 DIAGNOSIS — Z7401 Bed confinement status: Secondary | ICD-10-CM | POA: Diagnosis not present

## 2016-01-07 DIAGNOSIS — I5042 Chronic combined systolic (congestive) and diastolic (congestive) heart failure: Secondary | ICD-10-CM | POA: Diagnosis present

## 2016-01-07 DIAGNOSIS — Z6830 Body mass index (BMI) 30.0-30.9, adult: Secondary | ICD-10-CM | POA: Diagnosis not present

## 2016-01-07 DIAGNOSIS — I482 Chronic atrial fibrillation: Secondary | ICD-10-CM | POA: Diagnosis present

## 2016-01-07 DIAGNOSIS — N179 Acute kidney failure, unspecified: Secondary | ICD-10-CM

## 2016-01-07 DIAGNOSIS — I252 Old myocardial infarction: Secondary | ICD-10-CM | POA: Diagnosis not present

## 2016-01-07 DIAGNOSIS — J181 Lobar pneumonia, unspecified organism: Secondary | ICD-10-CM

## 2016-01-07 DIAGNOSIS — Z9981 Dependence on supplemental oxygen: Secondary | ICD-10-CM | POA: Diagnosis not present

## 2016-01-07 DIAGNOSIS — N170 Acute kidney failure with tubular necrosis: Secondary | ICD-10-CM | POA: Diagnosis not present

## 2016-01-07 DIAGNOSIS — G9340 Encephalopathy, unspecified: Secondary | ICD-10-CM | POA: Diagnosis present

## 2016-01-07 DIAGNOSIS — R1310 Dysphagia, unspecified: Secondary | ICD-10-CM | POA: Diagnosis present

## 2016-01-07 DIAGNOSIS — R410 Disorientation, unspecified: Secondary | ICD-10-CM

## 2016-01-07 DIAGNOSIS — N390 Urinary tract infection, site not specified: Secondary | ICD-10-CM | POA: Diagnosis present

## 2016-01-07 DIAGNOSIS — E785 Hyperlipidemia, unspecified: Secondary | ICD-10-CM | POA: Diagnosis present

## 2016-01-07 DIAGNOSIS — Z7189 Other specified counseling: Secondary | ICD-10-CM | POA: Insufficient documentation

## 2016-01-07 DIAGNOSIS — A419 Sepsis, unspecified organism: Secondary | ICD-10-CM

## 2016-01-07 DIAGNOSIS — R0602 Shortness of breath: Secondary | ICD-10-CM

## 2016-01-07 DIAGNOSIS — D509 Iron deficiency anemia, unspecified: Secondary | ICD-10-CM | POA: Diagnosis present

## 2016-01-07 DIAGNOSIS — I4892 Unspecified atrial flutter: Secondary | ICD-10-CM | POA: Diagnosis present

## 2016-01-07 DIAGNOSIS — I2510 Atherosclerotic heart disease of native coronary artery without angina pectoris: Secondary | ICD-10-CM | POA: Diagnosis present

## 2016-01-07 DIAGNOSIS — B965 Pseudomonas (aeruginosa) (mallei) (pseudomallei) as the cause of diseases classified elsewhere: Secondary | ICD-10-CM | POA: Diagnosis present

## 2016-01-07 DIAGNOSIS — E876 Hypokalemia: Secondary | ICD-10-CM | POA: Diagnosis not present

## 2016-01-07 DIAGNOSIS — I484 Atypical atrial flutter: Secondary | ICD-10-CM | POA: Diagnosis not present

## 2016-01-07 DIAGNOSIS — F0390 Unspecified dementia without behavioral disturbance: Secondary | ICD-10-CM | POA: Diagnosis present

## 2016-01-07 DIAGNOSIS — Z7951 Long term (current) use of inhaled steroids: Secondary | ICD-10-CM | POA: Diagnosis not present

## 2016-01-07 DIAGNOSIS — J189 Pneumonia, unspecified organism: Secondary | ICD-10-CM

## 2016-01-07 DIAGNOSIS — B370 Candidal stomatitis: Secondary | ICD-10-CM | POA: Insufficient documentation

## 2016-01-07 DIAGNOSIS — Z87891 Personal history of nicotine dependence: Secondary | ICD-10-CM | POA: Diagnosis not present

## 2016-01-07 DIAGNOSIS — R778 Other specified abnormalities of plasma proteins: Secondary | ICD-10-CM | POA: Diagnosis present

## 2016-01-07 LAB — COMPREHENSIVE METABOLIC PANEL
ALBUMIN: 2.4 g/dL — AB (ref 3.5–5.0)
ALT: 41 U/L (ref 14–54)
AST: 68 U/L — AB (ref 15–41)
Alkaline Phosphatase: 97 U/L (ref 38–126)
Anion gap: 16 — ABNORMAL HIGH (ref 5–15)
BILIRUBIN TOTAL: 1.2 mg/dL (ref 0.3–1.2)
BUN: 55 mg/dL — AB (ref 6–20)
CHLORIDE: 91 mmol/L — AB (ref 101–111)
CO2: 25 mmol/L (ref 22–32)
CREATININE: 2.38 mg/dL — AB (ref 0.44–1.00)
Calcium: 8.4 mg/dL — ABNORMAL LOW (ref 8.9–10.3)
GFR calc Af Amer: 23 mL/min — ABNORMAL LOW (ref >60)
GFR, EST NON AFRICAN AMERICAN: 20 mL/min — AB (ref >60)
GLUCOSE: 107 mg/dL — AB (ref 65–99)
POTASSIUM: 4 mmol/L (ref 3.5–5.1)
Sodium: 132 mmol/L — ABNORMAL LOW (ref 135–145)
Total Protein: 5.8 g/dL — ABNORMAL LOW (ref 6.5–8.1)

## 2016-01-07 LAB — I-STAT CG4 LACTIC ACID, ED
LACTIC ACID, VENOUS: 1.05 mmol/L (ref 0.5–2.0)
Lactic Acid, Venous: 2.17 mmol/L (ref 0.5–2.0)

## 2016-01-07 LAB — CBC WITH DIFFERENTIAL/PLATELET
BASOS ABS: 0 10*3/uL (ref 0.0–0.1)
BASOS PCT: 0 %
Eosinophils Absolute: 0 10*3/uL (ref 0.0–0.7)
Eosinophils Relative: 0 %
HEMATOCRIT: 39.9 % (ref 36.0–46.0)
Hemoglobin: 12.7 g/dL (ref 12.0–15.0)
LYMPHS PCT: 5 %
Lymphs Abs: 1 10*3/uL (ref 0.7–4.0)
MCH: 29.4 pg (ref 26.0–34.0)
MCHC: 31.8 g/dL (ref 30.0–36.0)
MCV: 92.4 fL (ref 78.0–100.0)
MONO ABS: 0.6 10*3/uL (ref 0.1–1.0)
Monocytes Relative: 3 %
NEUTROS ABS: 17.9 10*3/uL — AB (ref 1.7–7.7)
NEUTROS PCT: 92 %
Platelets: 283 10*3/uL (ref 150–400)
RBC: 4.32 MIL/uL (ref 3.87–5.11)
RDW: 15.8 % — AB (ref 11.5–15.5)
WBC: 19.6 10*3/uL — AB (ref 4.0–10.5)

## 2016-01-07 LAB — URINALYSIS, ROUTINE W REFLEX MICROSCOPIC
BILIRUBIN URINE: NEGATIVE
GLUCOSE, UA: NEGATIVE mg/dL
KETONES UR: 15 mg/dL — AB
NITRITE: NEGATIVE
PH: 8 (ref 5.0–8.0)
PROTEIN: 100 mg/dL — AB
Specific Gravity, Urine: 1.015 (ref 1.005–1.030)

## 2016-01-07 LAB — I-STAT CHEM 8, ED
BUN: 51 mg/dL — ABNORMAL HIGH (ref 6–20)
CALCIUM ION: 1 mmol/L — AB (ref 1.13–1.30)
CREATININE: 2.4 mg/dL — AB (ref 0.44–1.00)
Chloride: 92 mmol/L — ABNORMAL LOW (ref 101–111)
GLUCOSE: 101 mg/dL — AB (ref 65–99)
HCT: 46 % (ref 36.0–46.0)
Hemoglobin: 15.6 g/dL — ABNORMAL HIGH (ref 12.0–15.0)
POTASSIUM: 3.8 mmol/L (ref 3.5–5.1)
Sodium: 131 mmol/L — ABNORMAL LOW (ref 135–145)
TCO2: 25 mmol/L (ref 0–100)

## 2016-01-07 LAB — I-STAT TROPONIN, ED
TROPONIN I, POC: 0.05 ng/mL (ref 0.00–0.08)
Troponin i, poc: 0.06 ng/mL (ref 0.00–0.08)

## 2016-01-07 LAB — I-STAT VENOUS BLOOD GAS, ED
Acid-Base Excess: 1 mmol/L (ref 0.0–2.0)
Bicarbonate: 25.4 mEq/L — ABNORMAL HIGH (ref 20.0–24.0)
O2 SAT: 40 %
PCO2 VEN: 40.5 mmHg — AB (ref 45.0–50.0)
PO2 VEN: 23 mmHg — AB (ref 31.0–45.0)
TCO2: 27 mmol/L (ref 0–100)
pH, Ven: 7.405 — ABNORMAL HIGH (ref 7.250–7.300)

## 2016-01-07 LAB — URINE MICROSCOPIC-ADD ON

## 2016-01-07 LAB — PROTIME-INR
INR: 2.53 — ABNORMAL HIGH (ref 0.00–1.49)
Prothrombin Time: 26.9 seconds — ABNORMAL HIGH (ref 11.6–15.2)

## 2016-01-07 LAB — BRAIN NATRIURETIC PEPTIDE: B Natriuretic Peptide: 309.1 pg/mL — ABNORMAL HIGH (ref 0.0–100.0)

## 2016-01-07 MED ORDER — IPRATROPIUM BROMIDE 0.02 % IN SOLN
0.5000 mg | Freq: Four times a day (QID) | RESPIRATORY_TRACT | Status: DC
  Administered 2016-01-07 – 2016-01-10 (×12): 0.5 mg via RESPIRATORY_TRACT
  Filled 2016-01-07 (×12): qty 2.5

## 2016-01-07 MED ORDER — SODIUM CHLORIDE 0.9 % IV SOLN
INTRAVENOUS | Status: DC
  Administered 2016-01-07: 21:00:00 via INTRAVENOUS

## 2016-01-07 MED ORDER — PIPERACILLIN-TAZOBACTAM 3.375 G IVPB 30 MIN
3.3750 g | Freq: Once | INTRAVENOUS | Status: DC
  Filled 2016-01-07: qty 50

## 2016-01-07 MED ORDER — PIPERACILLIN-TAZOBACTAM 3.375 G IVPB
3.3750 g | Freq: Three times a day (TID) | INTRAVENOUS | Status: DC
  Administered 2016-01-08 – 2016-01-12 (×14): 3.375 g via INTRAVENOUS
  Filled 2016-01-07 (×19): qty 50

## 2016-01-07 MED ORDER — DILTIAZEM HCL ER COATED BEADS 180 MG PO CP24
180.0000 mg | ORAL_CAPSULE | Freq: Every day | ORAL | Status: DC
  Administered 2016-01-08 – 2016-01-10 (×3): 180 mg via ORAL
  Filled 2016-01-07 (×4): qty 1

## 2016-01-07 MED ORDER — LEVOTHYROXINE SODIUM 100 MCG IV SOLR
50.0000 ug | Freq: Every day | INTRAVENOUS | Status: DC
  Administered 2016-01-08 – 2016-01-12 (×5): 50 ug via INTRAVENOUS
  Filled 2016-01-07 (×5): qty 5

## 2016-01-07 MED ORDER — ATORVASTATIN CALCIUM 10 MG PO TABS
10.0000 mg | ORAL_TABLET | Freq: Every day | ORAL | Status: DC
  Administered 2016-01-09 – 2016-01-12 (×4): 10 mg via ORAL
  Filled 2016-01-07 (×4): qty 1

## 2016-01-07 MED ORDER — SODIUM CHLORIDE 0.9 % IV BOLUS (SEPSIS)
1000.0000 mL | Freq: Once | INTRAVENOUS | Status: AC
  Administered 2016-01-07: 1000 mL via INTRAVENOUS

## 2016-01-07 MED ORDER — BUDESONIDE 0.25 MG/2ML IN SUSP
0.2500 mg | Freq: Two times a day (BID) | RESPIRATORY_TRACT | Status: DC
  Administered 2016-01-08 – 2016-01-12 (×9): 0.25 mg via RESPIRATORY_TRACT
  Filled 2016-01-07 (×11): qty 2

## 2016-01-07 MED ORDER — VANCOMYCIN HCL IN DEXTROSE 750-5 MG/150ML-% IV SOLN
750.0000 mg | INTRAVENOUS | Status: DC
  Administered 2016-01-08 – 2016-01-09 (×2): 750 mg via INTRAVENOUS
  Filled 2016-01-07 (×5): qty 150

## 2016-01-07 MED ORDER — ARFORMOTEROL TARTRATE 15 MCG/2ML IN NEBU
15.0000 ug | INHALATION_SOLUTION | Freq: Two times a day (BID) | RESPIRATORY_TRACT | Status: DC
  Administered 2016-01-08 – 2016-01-12 (×10): 15 ug via RESPIRATORY_TRACT
  Filled 2016-01-07 (×11): qty 2

## 2016-01-07 MED ORDER — VANCOMYCIN HCL 10 G IV SOLR
1500.0000 mg | Freq: Once | INTRAVENOUS | Status: AC
  Administered 2016-01-07: 1500 mg via INTRAVENOUS
  Filled 2016-01-07: qty 1500

## 2016-01-07 MED ORDER — ALBUTEROL SULFATE (2.5 MG/3ML) 0.083% IN NEBU
2.5000 mg | INHALATION_SOLUTION | Freq: Four times a day (QID) | RESPIRATORY_TRACT | Status: DC | PRN
  Administered 2016-01-08: 2.5 mg via RESPIRATORY_TRACT
  Filled 2016-01-07: qty 3

## 2016-01-07 MED ORDER — PANTOPRAZOLE SODIUM 40 MG IV SOLR
40.0000 mg | INTRAVENOUS | Status: DC
  Administered 2016-01-07 – 2016-01-11 (×5): 40 mg via INTRAVENOUS
  Filled 2016-01-07 (×5): qty 40

## 2016-01-07 NOTE — ED Notes (Signed)
This RN attempting to contact pts nursing home - called once already with no answer. This RN calling now for a second time.

## 2016-01-07 NOTE — ED Notes (Signed)
Alveta Heimlich, RN from Nursing home reports pts mental status declined today, very lethargic, HR was 125, BP- 150/62, respirations were apneic at 8, severe bruising from blood thinners. Pt is on 3L [place] 02 at home.   Pt had xray and doppler of R leg yesterday - both were normal.

## 2016-01-07 NOTE — ED Provider Notes (Signed)
CSN: [id number]     Arrival date & time 01/07/16  1642 History   First MD Initiated Contact with Patient 01/07/16 1653     Chief Complaint  Patient presents with  . Shortness of Breath     (Consider location/radiation/quality/duration/timing/severity/associated sxs/prior Treatment) HPI   70 year old female with past medical history of hypertension, COPD, atrial flutter, dementia, Who presents with confusion. Per report from the nursing facility obtained by nursing, the patient had gradual and progressively worsening decline in mental status today. Patient has been "gaining fluid" and had worsening AKI over the last several weeks. This has been monitored at her nursing facility. Over the last 24 hours she has had increasing tachycardia as well as shortness of breath. She was subsequently brought to the emergency department for further evaluation. Per review of records, patient was full code during last admission. On arrival, she is confused and unable to provide further history at this time. Per EMS report, patient had a initial sat of 72%, but this was with poor waveform and resolved with supplemental oxygen. The patient had no evidence of hypotension en route.  Past Medical History  Diagnosis Date  . Hypertension   . Thyroid disease   . COPD (chronic obstructive pulmonary disease) ([place])   . Anxiety   . H/O hiatal hernia   . Hypothyroidism   . Shortness of breath   . Atrial flutter ([place]) 09/07/2012    , RVR/ hospital 12/13- rate control and Echo normal- on xarelto, when spontaneously converted she had bradycardia on Metoprolol and Digoxin  . GERD (gastroesophageal reflux disease)   . Rhinitis   . Parotitis     , on CT of face- 12/13  . CHF (congestive heart failure) ([place])   . Hx of echocardiogram     Echo (8/15):  EF 60-65%  . NSTEMI (non-ST elevated myocardial infarction) ([place])   . CAP (community acquired pneumonia) 09/14/2015   Past Surgical History  Procedure Laterality Date   . Tonsillectomy    . Breast surgery      benign R brest CA  . Cardioversion N/A 04/02/2014    Procedure: CARDIOVERSION;  Surgeon: Josue Hector, MD;  Location: [facility];  Service: Cardiovascular;  Laterality: N/A;  . Cardioversion N/A 08/17/2014    Procedure: CARDIOVERSION;  Surgeon: [name], MD;  Location: [facility];  Service: Cardiovascular;  Laterality: N/A;  . I&d extremity Right 09/18/2015    Procedure: IRRIGATION AND DEBRIDEMENT RIGHT LOWER LEG.;  Surgeon: Mcarthur Rossetti, MD;  Location: [facility];  Service: Orthopedics;  Laterality: Right;  . Application of wound vac Right 09/18/2015    Procedure: APPLICATION OF WOUND VAC RIGHT LOWER LEG;  Surgeon: Mcarthur Rossetti, MD;  Location: [facility];  Service: Orthopedics;  Laterality: Right;  . Skin split graft Right 09/23/2015    Procedure: SKIN GRAFT SPLIT THICKNESS RIGHT LEG;  Surgeon: Mcarthur Rossetti, MD;  Location: [facility];  Service: Orthopedics;  Laterality: Right;  Right Thigh   Family History  Problem Relation Age of Onset  . CAD Mother   . Stroke Mother   . Prostate cancer Father   . Dementia Father    Social History  Substance Use Topics  . Smoking status: Former Smoker -- 1.50 packs/day    Types: Cigarettes    Quit date: 07/23/2012  . Smokeless tobacco: None  . Alcohol Use: No     Comment: occassionally   OB History    No data available     Review  of Systems  Unable to perform ROS: Mental status change      Allergies  Review of patient's allergies indicates no known allergies.  Home Medications   Prior to Admission medications   Medication Sig Start Date End Date Taking? Authorizing Provider  acetaminophen (TYLENOL) 500 MG tablet Take 1,000 mg by mouth every 6 (six) hours as needed for mild pain.    Historical Provider, MD  albuterol (PROVENTIL) (2.5 MG/3ML) 0.083% nebulizer solution Take 2.5 mg by nebulization every 6 (six) hours as needed for wheezing or shortness of breath.    Historical  Provider, MD  Amino Acids-Protein Hydrolys (FEEDING SUPPLEMENT, PRO-STAT SUGAR FREE 64,) LIQD Take 30 mLs by mouth 2 (two) times daily. 10/03/15   Silver Huguenin Elgergawy, MD  arformoterol (BROVANA) 15 MCG/2ML NEBU Take 2 mLs (15 mcg total) by nebulization 2 (two) times daily. 10/03/15   Silver Huguenin Elgergawy, MD  atorvastatin (LIPITOR) 10 MG tablet Take 10 mg by mouth daily.  05/03/14   Historical Provider, MD  bisacodyl (DULCOLAX) 10 MG suppository Place 1 suppository (10 mg total) rectally daily as needed for moderate constipation. 10/03/15   Silver Huguenin Elgergawy, MD  bisacodyl (DULCOLAX) 5 MG EC tablet Take 2 tablets (10 mg total) by mouth daily as needed for moderate constipation. 10/03/15   Silver Huguenin Elgergawy, MD  budesonide (PULMICORT) 0.25 MG/2ML nebulizer solution Take 2 mLs (0.25 mg total) by nebulization 2 (two) times daily. 12/16/14   Barton Dubois, MD  cloNIDine (CATAPRES) 0.1 MG tablet Take 1 tablet (0.1 mg total) by mouth 2 (two) times daily. 10/03/15   Silver Huguenin Elgergawy, MD  diltiazem (CARDIZEM CD) 180 MG 24 hr capsule Take 1 capsule (180 mg total) by mouth daily. 10/03/15   Silver Huguenin Elgergawy, MD  furosemide (LASIX) 40 MG tablet Take 1.5 tablets (60 mg total) by mouth 2 (two) times daily. 10/03/15   Silver Huguenin Elgergawy, MD  iron polysaccharides (NIFEREX) 150 MG capsule Take 1 capsule (150 mg total) by mouth 2 (two) times daily. 12/16/14   Barton Dubois, MD  levothyroxine (SYNTHROID, LEVOTHROID) 100 MCG tablet Take 100 mcg by mouth daily.    Historical Provider, MD  mirtazapine (REMERON) 15 MG tablet Take 15 mg by mouth at bedtime.    Historical Provider, MD  pantoprazole (PROTONIX) 40 MG tablet Take 1 tablet (40 mg total) by mouth daily at 12 noon. 10/03/15   Albertine Patricia, MD  potassium chloride SA (K-DUR,KLOR-CON) 20 MEQ tablet TAKE ONE TABLET EACH DAY 06/29/15   Jerline Pain, MD  Rivaroxaban (XARELTO) 20 MG TABS Take 1 tablet (20 mg total) by mouth daily with breakfast. 09/12/12   Lavone Orn, MD   senna-docusate (SENOKOT-S) 8.6-50 MG tablet Take 2 tablets by mouth 2 (two) times daily. 10/03/15   Silver Huguenin Elgergawy, MD  tiotropium (SPIRIVA) 18 MCG inhalation capsule Place 18 mcg into inhaler and inhale daily.    Historical Provider, MD   BP 125/86 mmHg  Pulse 108  Temp(Src) 98.8 F (37.1 C) (Rectal)  Resp 26  SpO2 99% Physical Exam  Constitutional: She appears well-developed.  Elderly, chronically ill-appearing female in no acute distress. Nonrebreather is in place.  HENT:  Head: Normocephalic and atraumatic.  Mouth/Throat: No oropharyngeal exudate.  No apparent head trauma  Eyes: Conjunctivae are normal. Pupils are equal, round, and reactive to light.  Neck: Normal range of motion. Neck supple.  Cardiovascular: Regular rhythm, normal heart sounds and intact distal pulses.  Tachycardia present.  Exam reveals no  friction rub.   No murmur heard. Pulmonary/Chest: Effort normal. No respiratory distress. She has no wheezes. She has rales (Bibasilar, right greater than left).  Abdominal: Soft. She exhibits no distension. There is no tenderness.  Reducible umbilical hernia  Musculoskeletal: She exhibits no edema.  Neurological:  Patient is alert. Eyes are open spontaneously. She follows commands. She has a mildly confused verbal response but is in no acute distress. No unilateral weakness or numbness. Face is symmetric.  Skin: Skin is warm.    ED Course  Procedures (including critical care time) Labs Review Labs Reviewed  CBC WITH DIFFERENTIAL/PLATELET - Abnormal; Notable for the following:    WBC 19.6 (*)    RDW 15.8 (*)    Neutro Abs 17.9 (*)    All other components within normal limits  COMPREHENSIVE METABOLIC PANEL - Abnormal; Notable for the following:    Sodium 132 (*)    Chloride 91 (*)    Glucose, Bld 107 (*)    BUN 55 (*)    Creatinine, Ser 2.38 (*)    Calcium 8.4 (*)    Total Protein 5.8 (*)    Albumin 2.4 (*)    AST 68 (*)    GFR calc non Af Amer 20 (*)     GFR calc Af Amer 23 (*)    Anion gap 16 (*)    All other components within normal limits  BRAIN NATRIURETIC PEPTIDE - Abnormal; Notable for the following:    B Natriuretic Peptide 309.1 (*)    All other components within normal limits  URINALYSIS, ROUTINE W REFLEX MICROSCOPIC (NOT AT [facility]) - Abnormal; Notable for the following:    APPearance TURBID (*)    Hgb urine dipstick MODERATE (*)    Ketones, ur 15 (*)    Protein, ur 100 (*)    Leukocytes, UA LARGE (*)    All other components within normal limits  PROTIME-INR - Abnormal; Notable for the following:    Prothrombin Time 26.9 (*)    INR 2.53 (*)    All other components within normal limits  URINE MICROSCOPIC-ADD ON - Abnormal; Notable for the following:    Squamous Epithelial / LPF 0-5 (*)    Bacteria, UA MANY (*)    Crystals TRIPLE PHOSPHATE CRYSTALS (*)    All other components within normal limits  I-STAT CHEM 8, ED - Abnormal; Notable for the following:    Sodium 131 (*)    Chloride 92 (*)    BUN 51 (*)    Creatinine, Ser 2.40 (*)    Glucose, Bld 101 (*)    Calcium, Ion 1.00 (*)    Hemoglobin 15.6 (*)    All other components within normal limits  I-STAT CG4 LACTIC ACID, ED - Abnormal; Notable for the following:    Lactic Acid, Venous 2.17 (*)    All other components within normal limits  I-STAT VENOUS BLOOD GAS, ED - Abnormal; Notable for the following:    pH, Ven 7.405 (*)    pCO2, Ven 40.5 (*)    pO2, Ven 23.0 (*)    Bicarbonate 25.4 (*)    All other components within normal limits  CULTURE, BLOOD (ROUTINE X 2)  CULTURE, BLOOD (ROUTINE X 2)  CULTURE, EXPECTORATED SPUTUM-ASSESSMENT  GRAM STAIN  URINE CULTURE  STREP PNEUMONIAE URINARY ANTIGEN  PROCALCITONIN  PROTIME-INR  APTT  HEMOGLOBIN A1C  LEGIONELLA PNEUMOPHILA SEROGP 1 UR AG  CBC WITH DIFFERENTIAL/PLATELET  BASIC METABOLIC PANEL  I-STAT TROPOININ, ED  I-STAT CG4  LACTIC ACID, ED  I-STAT TROPOININ, ED  I-STAT TROPOININ, ED    Imaging Review Ct Head  Wo Contrast  01/07/2016  CLINICAL DATA:  Altered mental status over the last few days. Hypoxia with fluid retention. EXAM: CT HEAD WITHOUT CONTRAST TECHNIQUE: Contiguous axial images were obtained from the base of the skull through the vertex without intravenous contrast. COMPARISON:  11/05/2015 and 09/07/2012 examinations. FINDINGS: Brain: There is no evidence of acute intracranial hemorrhage, mass lesion, brain edema or extra-axial fluid collection. The ventricles and subarachnoid spaces are prominent but stable. There is no CT evidence of acute cortical infarction. There are chronic small vessel ischemic changes in the periventricular white matter and basal ganglia. Intracranial vascular calcifications are present. A small amount of air within the cavernous sinuses and infratemporal fossa is likely incidental, typically related to the presence of a an IV line. Bones/sinuses/visualized face: Mucosal thickening in the left maxillary sinus has improved. The visualized paranasal sinuses, mastoid air cells and middle ears are otherwise clear. The calvarium is intact. IMPRESSION: No acute intracranial findings. Stable atrophy and chronic small vessel ischemic changes. Interval improved left maxillary sinus mucosal thickening. Electronically Signed   By: Richardean Sale M.D.   On: 01/07/2016 20:25   Dg Chest Portable 1 View  01/07/2016  CLINICAL DATA:  Hypotensive.  Short of breath. EXAM: PORTABLE CHEST 1 VIEW COMPARISON:  10/03/2015 FINDINGS: Midline trachea. Cardiomegaly accentuated by AP portable technique. Atherosclerosis in the transverse aorta. No pleural effusion or pneumothorax. Mild left infrahilar atelectasis. Patchy right base airspace disease persists. Inferior right upper lobe aeration is improved. IMPRESSION: Persistent or recurrent right base airspace disease, which could represent infection or atelectasis. Cardiomegaly with aortic atherosclerosis.  No congestive failure. Electronically Signed   By:  Abigail Miyamoto M.D.   On: 01/07/2016 17:55   I have personally reviewed and evaluated these images and lab results as part of my medical decision-making.   EKG Interpretation   Date/Time:  Sunday January 07 2016 17:58:05 EDT Ventricular Rate:  108 PR Interval:    QRS Duration: 89 QT Interval:  361 QTC Calculation: 484 R Axis:   -30 Text Interpretation:  Atrial fibrillation Left axis deviation Low voltage,  extremity leads Abnormal R-wave progression, early transition Repol abnrm  suggests ischemia, diffuse leads Atrial fibrillation Artifact T wave  abnormality Abnormal ekg Confirmed by Carmin Muskrat  MD [phone]) on  01/08/2016 12:18:20 AM      MDM   70 year old female with calm placement medical history presents with altered mental status and risk for distress. On arrival the patient is altered but protecting her airway. She is tachycardic with suspected atrial fibrillation. Blood pressure is stable but below patient's baseline. She is satting 100% on nonrebreather. At this time, primary suspicion is acute encephalopathy secondary to sepsis. Primary consideration includes possible PNA. Must also consider COPD exacerbation with hypercapnia. CHF is also on the differential. Will start broad labs, supplemental oxygen, and sent stat blood gas. Will also obtain CT head given altered mental status although she has no focal deficits on exam. Given patient's altered mental status, suspect she will need admission.  Labs and imaging reviewed as above. Chest x-ray is concerning for pneumonia. CBC shows leukocytosis of 20,000 with left shift. CMP shows AKI with creatinine 2.3 BUN of 55. I've given her 1 L of normal saline. BNP is only mildly elevated at 309. Lactic acid is elevated at 2.2. Given positive surgical criteria and suspected sepsis, will start broad-spectrum antibiotics and admit to  medicine stepdown unit for close observation.   Clinical Impression: 1. Sepsis, due to unspecified organism  ([place])   2. Delirium   3. AKI (acute kidney injury) ([place])   4. Lactic acidosis   5. HAP (hospital-acquired pneumonia)     Disposition: Admit  Condition: Stable  Pt seen in conjunction with Dr. Vanita Panda.   Duffy Bruce, MD 01/08/16 [id number]  Carmin Muskrat, MD 01/09/16 [phone]

## 2016-01-07 NOTE — H&P (Signed)
[facility] History and Physical  Caitlin Hampton [id number] DOB: 09/11/1946 DOA: 01/07/2016  PCP: Gildardo Cranker, DO   Chief Complaint: Altered mental status  HPI: Caitlin Hampton is a 70 y.o. woman with a history of COPD with chronic O2 dependence (3-4L [place] at baseline), HTN, chronic afib/flutter (anticoagulated with Xarelto), diastolic heart failure, CAD, and dementia who has been a resident in a skilled nursing facility since her admission here in December for HCAP and right leg ulcer.  She has a sister and cousin at bedside who give her clinical history.  She has not really been motivated to work with PT and she has had poor appetite overall since her last discharge.  Family noticed acute changes within the past 24 hours, including altered mental status and increased lethargy.  No documented fever.  No focal pain complaints.  No nausea or vomiting.  No blood in her urine or stool.  Extensive bruising to her chest and all four extremities.  Her sister feels that this has been worse since she was put on Xarelto.  She has had decreased urine output.  ED evaluation today concerning for sepsis with elevated lactic acid level, AKI, significant leukocytosis, and persistent RLL infiltrate on chest xray.  U/A and head CT are pending at time of admission.  Review of Systems: Limited ROS negative except as stated in the HPI.  Past Medical History  Diagnosis Date  . Hypertension   . Thyroid disease   . COPD (chronic obstructive pulmonary disease) ([place])   . Anxiety   . H/O hiatal hernia   . Hypothyroidism   . Shortness of breath   . Atrial flutter ([place]) 09/07/2012    , RVR/ hospital 12/13- rate control and Echo normal- on xarelto, when spontaneously converted she had bradycardia on Metoprolol and Digoxin  . GERD (gastroesophageal reflux disease)   . Rhinitis   . Parotitis     , on CT of face- 12/13  . CHF (congestive heart failure) ([place])   . Hx of echocardiogram     Echo (8/15):  EF 60-65%   . NSTEMI (non-ST elevated myocardial infarction) ([place])   . CAP (community acquired pneumonia) 09/14/2015   Past Surgical History  Procedure Laterality Date  . Tonsillectomy    . Breast surgery      benign R brest CA  . Cardioversion N/A 04/02/2014    Procedure: CARDIOVERSION;  Surgeon: Josue Hector, MD;  Location: [facility];  Service: Cardiovascular;  Laterality: N/A;  . Cardioversion N/A 08/17/2014    Procedure: CARDIOVERSION;  Surgeon: [name], MD;  Location: [facility];  Service: Cardiovascular;  Laterality: N/A;  . I&d extremity Right 09/18/2015    Procedure: IRRIGATION AND DEBRIDEMENT RIGHT LOWER LEG.;  Surgeon: Mcarthur Rossetti, MD;  Location: [facility];  Service: Orthopedics;  Laterality: Right;  . Application of wound vac Right 09/18/2015    Procedure: APPLICATION OF WOUND VAC RIGHT LOWER LEG;  Surgeon: Mcarthur Rossetti, MD;  Location: [facility];  Service: Orthopedics;  Laterality: Right;  . Skin split graft Right 09/23/2015    Procedure: SKIN GRAFT SPLIT THICKNESS RIGHT LEG;  Surgeon: Mcarthur Rossetti, MD;  Location: [facility];  Service: Orthopedics;  Laterality: Right;  Right Thigh   Social History:  Social History   Social History Narrative  Former smoker, quit three years ago.  No EtOH or illicit drug use.  She was living with her sister prior to her admission in December.  No Known Allergies  Family  History  Problem Relation Age of Onset  . CAD Mother   . Stroke Mother   . Prostate cancer Father   . Dementia Father    Prior to Admission medications   Medication Sig Start Date End Date Taking? Authorizing Provider  acetaminophen (TYLENOL) 500 MG tablet Take 1,000 mg by mouth every 6 (six) hours as needed for mild pain.    Historical Provider, MD  albuterol (PROVENTIL) (2.5 MG/3ML) 0.083% nebulizer solution Take 2.5 mg by nebulization every 6 (six) hours as needed for wheezing or shortness of breath.    Historical Provider, MD  Amino Acids-Protein Hydrolys  (FEEDING SUPPLEMENT, PRO-STAT SUGAR FREE 64,) LIQD Take 30 mLs by mouth 2 (two) times daily. 10/03/15   Silver Huguenin Elgergawy, MD  arformoterol (BROVANA) 15 MCG/2ML NEBU Take 2 mLs (15 mcg total) by nebulization 2 (two) times daily. 10/03/15   Silver Huguenin Elgergawy, MD  atorvastatin (LIPITOR) 10 MG tablet Take 10 mg by mouth daily.  05/03/14   Historical Provider, MD  bisacodyl (DULCOLAX) 10 MG suppository Place 1 suppository (10 mg total) rectally daily as needed for moderate constipation. 10/03/15   Silver Huguenin Elgergawy, MD  bisacodyl (DULCOLAX) 5 MG EC tablet Take 2 tablets (10 mg total) by mouth daily as needed for moderate constipation. 10/03/15   Silver Huguenin Elgergawy, MD  budesonide (PULMICORT) 0.25 MG/2ML nebulizer solution Take 2 mLs (0.25 mg total) by nebulization 2 (two) times daily. 12/16/14   Barton Dubois, MD  cloNIDine (CATAPRES) 0.1 MG tablet Take 1 tablet (0.1 mg total) by mouth 2 (two) times daily. 10/03/15   Silver Huguenin Elgergawy, MD  diltiazem (CARDIZEM CD) 180 MG 24 hr capsule Take 1 capsule (180 mg total) by mouth daily. 10/03/15   Silver Huguenin Elgergawy, MD  furosemide (LASIX) 40 MG tablet Take 1.5 tablets (60 mg total) by mouth 2 (two) times daily. 10/03/15   Silver Huguenin Elgergawy, MD  iron polysaccharides (NIFEREX) 150 MG capsule Take 1 capsule (150 mg total) by mouth 2 (two) times daily. 12/16/14   Barton Dubois, MD  levothyroxine (SYNTHROID, LEVOTHROID) 100 MCG tablet Take 100 mcg by mouth daily.    Historical Provider, MD  mirtazapine (REMERON) 15 MG tablet Take 15 mg by mouth at bedtime.    Historical Provider, MD  pantoprazole (PROTONIX) 40 MG tablet Take 1 tablet (40 mg total) by mouth daily at 12 noon. 10/03/15   Albertine Patricia, MD  potassium chloride SA (K-DUR,KLOR-CON) 20 MEQ tablet TAKE ONE TABLET EACH DAY 06/29/15   Jerline Pain, MD  Rivaroxaban (XARELTO) 20 MG TABS Take 1 tablet (20 mg total) by mouth daily with breakfast. 09/12/12   Lavone Orn, MD  senna-docusate (SENOKOT-S) 8.6-50 MG  tablet Take 2 tablets by mouth 2 (two) times daily. 10/03/15   Silver Huguenin Elgergawy, MD  tiotropium (SPIRIVA) 18 MCG inhalation capsule Place 18 mcg into inhaler and inhale daily.    Historical Provider, MD   Physical Exam: Filed Vitals:   01/07/16 1720 01/07/16 1808 01/07/16 1845 01/07/16 1900  BP:  126/83 107/68 105/52  Pulse:   99 96  Temp:      TempSrc:      Resp:   18   SpO2: 100% 100% 97% 99%     General:  Lethargic, arouses to sternal rub  Head: [place]/AT  Eyes: PERRL bilaterally  ENT: Mucous membranes are extremely dry.  Cardiovascular: Irregular but rate controlled.  No significant pitting in her lower extremities.    Respiratory: Diminished bilaterally.  No significant wheeze or ronchi.  GI: Abdomen is soft/NT/ND.  Bowel sounds are present.  No guarding.  Skin: pale, very dry and peeling, extensive bruising in all four extremities, dressing in place to right leg, no heel ulcers  Musculoskeletal: Moves all four extremities on command.  Psychiatric: Flat affect.  Neurologic: No focal deficits.   Labs on Admission:  Basic Metabolic Panel:  Recent Labs Lab 01/07/16 1712 01/07/16 1718  NA 132* 131*  K 4.0 3.8  CL 91* 92*  CO2 25  --   GLUCOSE 107* 101*  BUN 55* 51*  CREATININE 2.38* 2.40*  CALCIUM 8.4*  --    Liver Function Tests:  Recent Labs Lab 01/07/16 1712  AST 68*  ALT 41  ALKPHOS 97  BILITOT 1.2  PROT 5.8*  ALBUMIN 2.4*   CBC:  Recent Labs Lab 01/07/16 1712 01/07/16 1718  WBC 19.6*  --   NEUTROABS 17.9*  --   HGB 12.7 15.6*  HCT 39.9 46.0  MCV 92.4  --   PLT 283  --    BNP (last 3 results)  Recent Labs  09/11/15 1527 09/14/15 1320 01/07/16 1712  BNP 737.3* 658.0* 309.1*   Radiological Exams on Admission: Dg Chest Portable 1 View  01/07/2016  CLINICAL DATA:  Hypotensive.  Short of breath. EXAM: PORTABLE CHEST 1 VIEW COMPARISON:  10/03/2015 FINDINGS: Midline trachea. Cardiomegaly accentuated by AP portable technique.  Atherosclerosis in the transverse aorta. No pleural effusion or pneumothorax. Mild left infrahilar atelectasis. Patchy right base airspace disease persists. Inferior right upper lobe aeration is improved. IMPRESSION: Persistent or recurrent right base airspace disease, which could represent infection or atelectasis. Cardiomegaly with aortic atherosclerosis.  No congestive failure. Electronically Signed   By: Abigail Miyamoto M.D.   On: 01/07/2016 17:55    EKG: Independently reviewed. Atrial flutter.  Rate controlled.  I do not see ST depression greater than 26mm.  Assessment/Plan Principal Problem:   Sepsis ([place]) Active Problems:   Atrial flutter ([facility])   Elevated troponin   RLL pneumonia   AKI (acute kidney injury) ([place])  Admit to the stepdown unit  Sepsis secondary to RLL (?recurrent vs persistent infiltrate on xray), U/A pending --Agree with empiric vanc and zosyn --Blood and urine cultures --Head CT pending (for acute encephalopathy) --Follow lactic acid --Check procalcitonin --Follow troponin --CBC, BMP in AM  AKI --Holding potassium and lasix for now --Cautious hydration given her history of CHF --BMP in AM --Strict I/O --Follow up U/A  Chronic anticoagulation --Hold Xarelto tonight for renal insufficiency; re-evaluate in the AM.  May need to go to subQ lovenox short term and consider whether she is appropriate for continued long-term anticoagulation  Chronic afib/flutter --Currently rate controlled.  Continue to monitor.  Hopefully she is back on her oral cardizem by tomorrow.  Can use IV cardizem if needed while NPO.  Screen for diabetes with A1c per family request  Oral care for dry mouth  Code Status: FULL Family Communication: Sister, cousin at bedside at time of admission Disposition Plan: Expect her to be her for several days  Time spent: 36 minutes  [employer] [facility]  01/07/2016, 8:10 PM

## 2016-01-07 NOTE — ED Notes (Signed)
When laid pt down to obtain urine sample the 12 lead became more clear, new EKG obtained, exported and shown to Dr. Ellender Hose and Vanita Panda.

## 2016-01-07 NOTE — ED Notes (Signed)
Dr. Ellender Hose and Dr. Vanita Panda informed of patients lactic acid of 2.17 and also VBG results.

## 2016-01-07 NOTE — ED Notes (Signed)
This RN called CT to determine where pt is at on the list to be taken over and was told there are 2-3 patients ahead of patient.

## 2016-01-07 NOTE — ED Notes (Signed)
Lab called to add on Protime- INR 

## 2016-01-07 NOTE — Progress Notes (Signed)
Pharmacy Antibiotic Note  Caitlin Hampton is a 70 y.o. female admitted on 01/07/2016 with sepsis.  AKI with sCr 2.4, eCrCl 20-30 ml/min, LA 2.1, WBC 19.6, afebrile.    Plan: -Vancomycin 1500 mg IV x1 then 750/24h -Zosyn 3.375 g IV q8h -Monitor renal fx, cultures, duration of therapy, VT as needed     Temp (24hrs), Avg:98.8 F (37.1 C), Min:98.8 F (37.1 C), Max:98.8 F (37.1 C)   Recent Labs Lab 01/07/16 1712 01/07/16 1718 01/07/16 1722  WBC 19.6*  --   --   CREATININE  --  2.40*  --   LATICACIDVEN  --   --  2.17*    CrCl cannot be calculated (Unknown ideal weight.).    No Known Allergies  Antimicrobials this admission: Vancomycin 4/16  >>  Zosyn 4/16 >>   Dose adjustments this admission: NA  Microbiology results: 4/16 BCx:  4/16 UCx:     Thank you for allowing pharmacy to be a part of this patient's care.  Harvel Quale 01/07/2016 5:37 PM

## 2016-01-07 NOTE — ED Notes (Signed)
PER EMS: pt from [facility], staff called EMS due to hypotension (nurse at [facility] stated to EMS that her BP was "24 dialystolic"), AMS over a few days, hypoxia, fluid retention to abdomen, and tachycardia. Fire dept arrived pts O2 sats were 72% but EMS unsure if it was an accurate reading because they could not get a good pleth but placed her on 12L NRB anyways. Pt denies SOB, did not appear to be in any distress.  Pt is A&OX4, follows commands. BP-152/100, HR-100 afib, RR-22, CBG-129.

## 2016-01-08 ENCOUNTER — Inpatient Hospital Stay (HOSPITAL_COMMUNITY): Payer: Medicare Other

## 2016-01-08 DIAGNOSIS — A419 Sepsis, unspecified organism: Secondary | ICD-10-CM

## 2016-01-08 LAB — CBC WITH DIFFERENTIAL/PLATELET
BASOS ABS: 0 10*3/uL (ref 0.0–0.1)
BASOS PCT: 0 %
EOS ABS: 0.1 10*3/uL (ref 0.0–0.7)
EOS PCT: 0 %
HCT: 31.9 % — ABNORMAL LOW (ref 36.0–46.0)
Hemoglobin: 10.5 g/dL — ABNORMAL LOW (ref 12.0–15.0)
LYMPHS PCT: 1 %
Lymphs Abs: 0.1 10*3/uL — ABNORMAL LOW (ref 0.7–4.0)
MCH: 30.6 pg (ref 26.0–34.0)
MCHC: 32.9 g/dL (ref 30.0–36.0)
MCV: 93 fL (ref 78.0–100.0)
MONO ABS: 0.4 10*3/uL (ref 0.1–1.0)
Monocytes Relative: 3 %
Neutro Abs: 13.7 10*3/uL — ABNORMAL HIGH (ref 1.7–7.7)
Neutrophils Relative %: 96 %
PLATELETS: 241 10*3/uL (ref 150–400)
RBC: 3.43 MIL/uL — AB (ref 3.87–5.11)
RDW: 16.4 % — AB (ref 11.5–15.5)
WBC: 14.4 10*3/uL — AB (ref 4.0–10.5)

## 2016-01-08 LAB — BASIC METABOLIC PANEL
ANION GAP: 13 (ref 5–15)
BUN: 54 mg/dL — ABNORMAL HIGH (ref 6–20)
CALCIUM: 7.4 mg/dL — AB (ref 8.9–10.3)
CO2: 22 mmol/L (ref 22–32)
Chloride: 99 mmol/L — ABNORMAL LOW (ref 101–111)
Creatinine, Ser: 2 mg/dL — ABNORMAL HIGH (ref 0.44–1.00)
GFR calc Af Amer: 28 mL/min — ABNORMAL LOW (ref >60)
GFR, EST NON AFRICAN AMERICAN: 24 mL/min — AB (ref >60)
GLUCOSE: 90 mg/dL (ref 65–99)
Potassium: 3.4 mmol/L — ABNORMAL LOW (ref 3.5–5.1)
SODIUM: 134 mmol/L — AB (ref 135–145)

## 2016-01-08 LAB — MRSA PCR SCREENING: MRSA by PCR: POSITIVE — AB

## 2016-01-08 LAB — I-STAT TROPONIN, ED: TROPONIN I, POC: 0.04 ng/mL (ref 0.00–0.08)

## 2016-01-08 LAB — STREP PNEUMONIAE URINARY ANTIGEN: Strep Pneumo Urinary Antigen: NEGATIVE

## 2016-01-08 MED ORDER — SODIUM CHLORIDE 0.9 % IV SOLN
INTRAVENOUS | Status: DC
  Administered 2016-01-08: 03:00:00 via INTRAVENOUS

## 2016-01-08 MED ORDER — POTASSIUM CHLORIDE CRYS ER 20 MEQ PO TBCR
40.0000 meq | EXTENDED_RELEASE_TABLET | Freq: Once | ORAL | Status: AC
  Administered 2016-01-08: 40 meq via ORAL
  Filled 2016-01-08: qty 2

## 2016-01-08 MED ORDER — MUPIROCIN 2 % EX OINT
1.0000 "application " | TOPICAL_OINTMENT | Freq: Two times a day (BID) | CUTANEOUS | Status: DC
  Administered 2016-01-09 – 2016-01-12 (×7): 1 via NASAL
  Filled 2016-01-08 (×5): qty 22

## 2016-01-08 MED ORDER — RIVAROXABAN 15 MG PO TABS
15.0000 mg | ORAL_TABLET | Freq: Every day | ORAL | Status: DC
  Administered 2016-01-08 – 2016-01-10 (×3): 15 mg via ORAL
  Filled 2016-01-08 (×4): qty 1

## 2016-01-08 MED ORDER — CHLORHEXIDINE GLUCONATE 0.12 % MT SOLN
15.0000 mL | Freq: Two times a day (BID) | OROMUCOSAL | Status: DC
  Administered 2016-01-08 – 2016-01-12 (×7): 15 mL via OROMUCOSAL
  Filled 2016-01-08 (×5): qty 15

## 2016-01-08 MED ORDER — CETYLPYRIDINIUM CHLORIDE 0.05 % MT LIQD
7.0000 mL | Freq: Two times a day (BID) | OROMUCOSAL | Status: DC
Start: 2016-01-09 — End: 2016-01-12
  Administered 2016-01-09 – 2016-01-11 (×6): 7 mL via OROMUCOSAL

## 2016-01-08 MED ORDER — SODIUM CHLORIDE 0.9 % IV BOLUS (SEPSIS)
1000.0000 mL | Freq: Once | INTRAVENOUS | Status: AC
  Administered 2016-01-08: 1000 mL via INTRAVENOUS

## 2016-01-08 MED ORDER — SODIUM CHLORIDE 0.9 % IV SOLN
INTRAVENOUS | Status: DC
  Administered 2016-01-08: 50 mL/h via INTRAVENOUS
  Administered 2016-01-09: 05:00:00 via INTRAVENOUS

## 2016-01-08 MED ORDER — CHLORHEXIDINE GLUCONATE CLOTH 2 % EX PADS
6.0000 | MEDICATED_PAD | Freq: Every day | CUTANEOUS | Status: DC
  Administered 2016-01-10 – 2016-01-12 (×3): 6 via TOPICAL

## 2016-01-08 NOTE — Progress Notes (Signed)
Accepted report from ED RN - was waiting for the monitor in room 2C14 be returned from radiology.

## 2016-01-08 NOTE — ED Notes (Signed)
Attempted report to 2 [facility]

## 2016-01-08 NOTE — ED Notes (Signed)
Pt bp dropping to 79/40.  Dr Candiss Norse paged.

## 2016-01-08 NOTE — ED Notes (Addendum)
Pt returned from [place] with bleeding from IV site and malpositioning. RN attempted to flush IV, which was infiltrated by saline. IV removed with catheter intact.

## 2016-01-08 NOTE — ED Notes (Signed)
Wound care nurse at bedside.

## 2016-01-08 NOTE — Progress Notes (Signed)
ANTICOAGULATION CONSULT NOTE - Initial Consult  Pharmacy Consult for xarelto Indication: atrial fibrillation  No Known Allergies  Patient Measurements:   Heparin Dosing Weight:   Vital Signs: BP: 142/76 mmHg (04/17 0701) Pulse Rate: 90 (04/17 0701)  Labs:  Recent Labs  01/07/16 1712 01/07/16 1718 01/08/16 0241  HGB 12.7 15.6* 10.5*  HCT 39.9 46.0 31.9*  PLT 283  --  241  LABPROT 26.9*  --   --   INR 2.53*  --   --   CREATININE 2.38* 2.40* 2.00*    CrCl cannot be calculated (Unknown ideal weight.).   Medical History: Past Medical History  Diagnosis Date  . Hypertension   . Thyroid disease   . COPD (chronic obstructive pulmonary disease) ([place])   . Anxiety   . H/O hiatal hernia   . Hypothyroidism   . Shortness of breath   . Atrial flutter ([place]) 09/07/2012    , RVR/ hospital 12/13- rate control and Echo normal- on xarelto, when spontaneously converted she had bradycardia on Metoprolol and Digoxin  . GERD (gastroesophageal reflux disease)   . Rhinitis   . Parotitis     , on CT of face- 12/13  . CHF (congestive heart failure) ([place])   . Hx of echocardiogram     Echo (8/15):  EF 60-65%  . NSTEMI (non-ST elevated myocardial infarction) ([place])   . CAP (community acquired pneumonia) 09/14/2015   Assessment: 16 yof presented from SNF with AMS. She is on chronic xarelto for history of afib. To restart this today. Of note, she as some new kidney dysfunction but Scr has trended down since admit. H/H is low and platelets are WNL. No bleeding noted but extensive bruising noted.    Goal of Therapy:  Monitor platelets by anticoagulation protocol: Yes   Plan:  - Xarelto 15mg  PO daily - F/u renal fxn, S&S of bleeding  Makisha Marrin, Rande Lawman 01/08/2016,7:35 AM

## 2016-01-08 NOTE — ED Notes (Signed)
Airmattress ordered.

## 2016-01-08 NOTE — ED Notes (Signed)
Breakfast Tray ordered @ 0621/heart healthy.

## 2016-01-08 NOTE — Progress Notes (Addendum)
Pt received from ED per stretcher. Oriented to self only. CHG bath done swabbed for MRSA, Wound care RN saw pt in ED. Pt has pink Allevyn foam over areas that needed to be covered. Pt has many small skin tears

## 2016-01-08 NOTE — ED Notes (Signed)
Dr. Loleta Books informed patients BP had dropped to 83/52. He stated he would call a Code Sepsis and will order more IV normal saline.

## 2016-01-08 NOTE — Consult Note (Signed)
WOC wound consult note Reason for Consult: Consult requested for bilat legs and skin tears.  Pt had a skin graft in January by Dr Ninfa Linden, according to the EMR.  Pt has very thin fragile skin with multiple brusies Wound type:Left posterior thigh with previous skin graft site which has healed; 6X3cm with pink dry scar tissue; no open wound or drainage Right posterior leg with previous donor site which has healed with pink dry scar tissue; 8X3cm Left posterior shoulder with partial thickness skin tear; 2X3X.1cm, pink and moist Left inner under arm with partial thickness skin tear; .2X2X.1cm, pink and moist Dressing procedure/placement/frequency: Foam dressing to protect sites from further injury.  Discussed plan of care with patient. Please re-consult if further assistance is needed.  Thank-you,  Julien Girt MSN, [place], [place], [place], [place]

## 2016-01-08 NOTE — Plan of Care (Signed)
Problem: Education: Goal: Knowledge of [facility] Education information/materials will improve Outcome: Progressing Discussed importance of trying wear sequential compression hose to prevent blood clots

## 2016-01-08 NOTE — Progress Notes (Addendum)
_                       [facility] PROGRESS NOTE                                                                                                                                                                                                             Patient Demographics:    Caitlin Hampton, is a 70 y.o. female, DOB - 1946-09-15, [place] date - 01/07/2016   Admitting Physician No admitting provider for patient encounter.  Outpatient Primary MD for the patient is Gildardo Cranker, DO  LOS - 1  Outpatient Specialists:   Chief Complaint  Patient presents with  . Shortness of Breath       Brief Narrative      Subjective:    William Dalton today has, No headache, No chest pain, No abdominal pain - No Nausea, No new weakness tingling or numbness, No Cough - SOB. Unreliable historian   Assessment  & Plan :     1.Sepsis due to UTI and likely aspiration pneumonia - appears nontoxic, continue hydration, continue empiric IV antibiotics which are vancomycin and Zosyn, follow cultures, speech to evaluate from aspiration standpoint.  Her overall quality of life appears very poor, she is severely deconditioned, most likely she is bedbound, has multiple bruises all over. Will involve palliative care for goals of care .  Had discussions with patient's sister at 10:20 AM on 01/08/2016. Explained poor prognosis, she has agreed for medical treatment, she is agreed for DO NOT RESUSCITATE. We will have palliative care discuss further goals of care. Question holding xaralto in the light of extensive bruising with the end of standing that patient can have a stroke. We will have palliative care discuss with patient's family.   2. ARF. Likely due to sepsis in the setting of patient being on Lasix and potassium. Hydrate, check renal ultrasound repeat BMP.  3. History of Chronic A. fib/flutter. [place] vasc 2 score of greater than 4. On xaralto, dose to be adjusted by pharmacy,  she has multiple bruises all over, I will discuss this with family to reconsider long-term anticoagulation. Continue Cardizem for rate control.  4. History of dementia, generalized deconditioning, poor functional status. Continue home medications, supportive care, at risk for delirium. Minimize benzodiazepines and narcotics.  5. Hypothyroidism. Continue home dose Synthroid.  6. COPD. At baseline. Uses 4 L nasal cannula oxygen, continue oxygen supplementation along with nebulizer treatments.  7. Dyslipidemia. On statin continue.  8.  GERD. On PPI.  9. History of CAD. Stable. Continue medications for secondary prevention which include statin, no aspirin as she is on xaralto. Chest pain-free.    Code Status : DNR, prognosis appears poor, DW sister  Family Communication  : Called the 4 listed numbers for sister and brother but no response  Disposition Plan  : SNF 1-2 days  Barriers For Discharge : UTI/sepsis  Consults  :  Pall Care  Procedures  :   CT head nonacute  DVT Prophylaxis  :  Xaralto  Lab Results  Component Value Date   PLT 241 01/08/2016    Antibiotics  :    Anti-infectives    Start     Dose/Rate Route Frequency Ordered Stop   01/08/16 1800  vancomycin (VANCOCIN) IVPB 750 mg/150 ml premix     750 mg 150 mL/hr over 60 Minutes Intravenous Every 24 hours 01/07/16 1804     01/08/16 0000  piperacillin-tazobactam (ZOSYN) IVPB 3.375 g     3.375 g 12.5 mL/hr over 240 Minutes Intravenous 3 times per day 01/07/16 1804     01/07/16 1830  vancomycin (VANCOCIN) 1,500 mg in sodium chloride 0.9 % 500 mL IVPB     1,500 mg 250 mL/hr over 120 Minutes Intravenous  Once 01/07/16 1755 01/07/16 2143   01/07/16 1800  piperacillin-tazobactam (ZOSYN) IVPB 3.375 g     3.375 g 100 mL/hr over 30 Minutes Intravenous  Once 01/07/16 1755          Objective:   Filed Vitals:   01/08/16 0630 01/08/16 0701 01/08/16 0741 01/08/16 0748  BP: 109/88 142/76    Pulse: 88 90    Temp:       TempSrc:      Resp: 14     SpO2:   92% 92%    Wt Readings from Last 3 Encounters:  12/21/15 77.735 kg (171 lb 6 oz)  11/15/15 82.464 kg (181 lb 12.8 oz)  11/05/15 92.987 kg (205 lb)     Intake/Output Summary (Last 24 hours) at 01/08/16 0920 Last data filed at 01/08/16 0351  Gross per 24 hour  Intake   2500 ml  Output      0 ml  Net   2500 ml     Physical Exam  Awake , able answer basic questions and follow basic commands, No new F.N deficits, Normal affect [place].AT,PERRAL Supple Neck,No JVD, No cervical lymphadenopathy appriciated.  Symmetrical Chest wall movement, Good air movement bilaterally, CTAB RRR,No Gallops,Rubs or new Murmurs, No Parasternal Heave +ve B.Sounds, Abd Soft, No tenderness, No organomegaly appriciated, No rebound - guarding or rigidity. No Cyanosis, Clubbing or edema, No new Rash , Multiple diffuse bruises, chronic contractures       Data Review:    CBC  Recent Labs Lab 01/07/16 1712 01/07/16 1718 01/08/16 0241  WBC 19.6*  --  14.4*  HGB 12.7 15.6* 10.5*  HCT 39.9 46.0 31.9*  PLT 283  --  241  MCV 92.4  --  93.0  MCH 29.4  --  30.6  MCHC 31.8  --  32.9  RDW 15.8*  --  16.4*  LYMPHSABS 1.0  --  0.1*  MONOABS 0.6  --  0.4  EOSABS 0.0  --  0.1  BASOSABS 0.0  --  0.0    Chemistries   Recent Labs Lab 01/07/16 1712 01/07/16 1718 01/08/16 0241  NA 132* 131* 134*  K 4.0 3.8 3.4*  CL 91* 92* 99*  CO2 25  --  22  GLUCOSE 107* 101* 90  BUN 55* 51* 54*  CREATININE 2.38* 2.40* 2.00*  CALCIUM 8.4*  --  7.4*  AST 68*  --   --   ALT 41  --   --   ALKPHOS 97  --   --   BILITOT 1.2  --   --    ------------------------------------------------------------------------------------------------------------------ No results for input(s): CHOL, HDL, LDLCALC, TRIG, CHOLHDL, LDLDIRECT in the last 72 hours.  Lab Results  Component Value Date   HGBA1C 5.8* 09/07/2012    ------------------------------------------------------------------------------------------------------------------ No results for input(s): TSH, T4TOTAL, T3FREE, THYROIDAB in the last 72 hours.  Invalid input(s): FREET3 ------------------------------------------------------------------------------------------------------------------ No results for input(s): VITAMINB12, FOLATE, FERRITIN, TIBC, IRON, RETICCTPCT in the last 72 hours.  Coagulation profile  Recent Labs Lab 01/07/16 1712  INR 2.53*    No results for input(s): DDIMER in the last 72 hours.  Cardiac Enzymes No results for input(s): CKMB, TROPONINI, MYOGLOBIN in the last 168 hours.  Invalid input(s): CK ------------------------------------------------------------------------------------------------------------------    Component Value Date/Time   BNP 309.1* 01/07/2016 1712    Inpatient Medications  Scheduled Meds: . arformoterol  15 mcg Nebulization BID  . atorvastatin  10 mg Oral Daily  . budesonide  0.25 mg Nebulization BID  . diltiazem  180 mg Oral Daily  . ipratropium  0.5 mg Nebulization QID  . levothyroxine  50 mcg Intravenous Daily  . pantoprazole (PROTONIX) IV  40 mg Intravenous Q24H  . potassium chloride  40 mEq Oral Once  . rivaroxaban  15 mg Oral Daily  . vancomycin  750 mg Intravenous Q24H   Continuous Infusions: . sodium chloride 50 mL/hr (01/08/16 0905)  . piperacillin-tazobactam Stopped (01/07/16 1947)  . piperacillin-tazobactam (ZOSYN)  IV 3.375 g (01/08/16 0904)   PRN Meds:.albuterol  Micro Results No results found for this or any previous visit (from the past 240 hour(s)).  Radiology Reports Ct Head Wo Contrast  01/07/2016  CLINICAL DATA:  Altered mental status over the last few days. Hypoxia with fluid retention. EXAM: CT HEAD WITHOUT CONTRAST TECHNIQUE: Contiguous axial images were obtained from the base of the skull through the vertex without intravenous contrast. COMPARISON:   11/05/2015 and 09/07/2012 examinations. FINDINGS: Brain: There is no evidence of acute intracranial hemorrhage, mass lesion, brain edema or extra-axial fluid collection. The ventricles and subarachnoid spaces are prominent but stable. There is no CT evidence of acute cortical infarction. There are chronic small vessel ischemic changes in the periventricular white matter and basal ganglia. Intracranial vascular calcifications are present. A small amount of air within the cavernous sinuses and infratemporal fossa is likely incidental, typically related to the presence of a an IV line. Bones/sinuses/visualized face: Mucosal thickening in the left maxillary sinus has improved. The visualized paranasal sinuses, mastoid air cells and middle ears are otherwise clear. The calvarium is intact. IMPRESSION: No acute intracranial findings. Stable atrophy and chronic small vessel ischemic changes. Interval improved left maxillary sinus mucosal thickening. Electronically Signed   By: Richardean Sale M.D.   On: 01/07/2016 20:25   Dg Chest Portable 1 View  01/07/2016  CLINICAL DATA:  Hypotensive.  Short of breath. EXAM: PORTABLE CHEST 1 VIEW COMPARISON:  10/03/2015 FINDINGS: Midline trachea. Cardiomegaly accentuated by AP portable technique. Atherosclerosis in the transverse aorta. No pleural effusion or pneumothorax. Mild left infrahilar atelectasis. Patchy right base airspace disease persists. Inferior right upper lobe aeration is improved. IMPRESSION: Persistent or recurrent right base airspace disease, which could represent infection or atelectasis. Cardiomegaly with aortic atherosclerosis.  No congestive failure. Electronically Signed   By: Abigail Miyamoto M.D.   On: 01/07/2016 17:55    Time Spent in minutes  25   SINGH,PRASHANT K M.D on 01/08/2016 at 9:20 AM  Between 7am to 7pm - Pager - [phone]  After 7pm go to [email or web address] - password [email or web address]  [facility] -  Office  [phone]

## 2016-01-09 DIAGNOSIS — I4892 Unspecified atrial flutter: Secondary | ICD-10-CM

## 2016-01-09 DIAGNOSIS — Z515 Encounter for palliative care: Secondary | ICD-10-CM | POA: Insufficient documentation

## 2016-01-09 DIAGNOSIS — B370 Candidal stomatitis: Secondary | ICD-10-CM

## 2016-01-09 DIAGNOSIS — R634 Abnormal weight loss: Secondary | ICD-10-CM

## 2016-01-09 DIAGNOSIS — B3781 Candidal esophagitis: Secondary | ICD-10-CM

## 2016-01-09 DIAGNOSIS — Z7189 Other specified counseling: Secondary | ICD-10-CM

## 2016-01-09 DIAGNOSIS — N179 Acute kidney failure, unspecified: Secondary | ICD-10-CM

## 2016-01-09 DIAGNOSIS — I482 Chronic atrial fibrillation: Secondary | ICD-10-CM

## 2016-01-09 LAB — BASIC METABOLIC PANEL
Anion gap: 17 — ABNORMAL HIGH (ref 5–15)
BUN: 43 mg/dL — ABNORMAL HIGH (ref 6–20)
CHLORIDE: 108 mmol/L (ref 101–111)
CO2: 16 mmol/L — AB (ref 22–32)
CREATININE: 1.76 mg/dL — AB (ref 0.44–1.00)
Calcium: 7.6 mg/dL — ABNORMAL LOW (ref 8.9–10.3)
GFR calc non Af Amer: 28 mL/min — ABNORMAL LOW (ref >60)
GFR, EST AFRICAN AMERICAN: 33 mL/min — AB (ref >60)
Glucose, Bld: 88 mg/dL (ref 65–99)
Potassium: 3.6 mmol/L (ref 3.5–5.1)
Sodium: 141 mmol/L (ref 135–145)

## 2016-01-09 LAB — CBC
HEMATOCRIT: 35.6 % — AB (ref 36.0–46.0)
HEMOGLOBIN: 11 g/dL — AB (ref 12.0–15.0)
MCH: 28.9 pg (ref 26.0–34.0)
MCHC: 30.9 g/dL (ref 30.0–36.0)
MCV: 93.4 fL (ref 78.0–100.0)
Platelets: 217 10*3/uL (ref 150–400)
RBC: 3.81 MIL/uL — ABNORMAL LOW (ref 3.87–5.11)
RDW: 16.1 % — ABNORMAL HIGH (ref 11.5–15.5)
WBC: 12.7 10*3/uL — ABNORMAL HIGH (ref 4.0–10.5)

## 2016-01-09 LAB — LEGIONELLA PNEUMOPHILA SEROGP 1 UR AG: L. PNEUMOPHILA SEROGP 1 UR AG: NEGATIVE

## 2016-01-09 LAB — HEMOGLOBIN A1C
Hgb A1c MFr Bld: 5.7 % — ABNORMAL HIGH (ref 4.8–5.6)
Mean Plasma Glucose: 117 mg/dL

## 2016-01-09 MED ORDER — DILTIAZEM HCL 25 MG/5ML IV SOLN
10.0000 mg | Freq: Four times a day (QID) | INTRAVENOUS | Status: DC | PRN
  Filled 2016-01-09: qty 5

## 2016-01-09 MED ORDER — SODIUM CHLORIDE 0.45 % IV SOLN
INTRAVENOUS | Status: DC
  Administered 2016-01-09: 11:00:00 via INTRAVENOUS

## 2016-01-09 MED ORDER — NYSTATIN 100000 UNIT/ML MT SUSP
5.0000 mL | Freq: Four times a day (QID) | OROMUCOSAL | Status: DC
  Administered 2016-01-09 – 2016-01-12 (×10): 500000 [IU] via ORAL
  Filled 2016-01-09 (×12): qty 5

## 2016-01-09 NOTE — Progress Notes (Signed)
SLP Cancellation Note  Patient Details Name: Caitlin Hampton MRN: [id number] DOB: 09/26/1945   Cancelled treatment:       Reason Eval/Treat Not Completed: Other (comment) Pt/family currently meeting with palliative care team. Will f/u as able.   Germain Osgood, M.A. CCC-SLP [phone]  Germain Osgood 01/09/2016, 10:05 AM

## 2016-01-09 NOTE — Progress Notes (Addendum)
Initial Nutrition Assessment  DOCUMENTATION CODES:   Severe malnutrition in context of chronic illness  INTERVENTION:  -Ensure Enlive po TID, each supplement provides 350 kcal and 20 grams of protein w/ diet advancement -Diet advancement per MD -RD continue to monitor  NUTRITION DIAGNOSIS:   Malnutrition related to chronic illness as evidenced by percent weight loss, energy intake < 75% for > or equal to 1 month.  GOAL:   Patient will meet greater than or equal to 90% of their needs  MONITOR:   PO intake, Labs, Supplement acceptance, I & O's, Skin  REASON FOR ASSESSMENT:   Malnutrition Screening Tool    ASSESSMENT:   Caitlin Hampton is a 70 y.o. woman with a history of COPD with chronic O2 dependence (3-4L [place] at baseline), HTN, chronic afib/flutter (anticoagulated with Xarelto), diastolic heart failure, CAD, and dementia who has been a resident in a skilled nursing facility since her admission here in December for HCAP and right leg ulcer  Spoke with Caitlin Hampton at bedside. She appears to still suffer from AMS. Denied weight loss but chart shows 78#/34% severe wt loss in 4 months. Per patient's sister at bedside, Pt doesn't eat much at SNF. "Maybe some yogurt or fruit." She has not had a tray here yet, has been NPO going on 3 days now. Sister stated she has not tried Ensure or anything along those lines yet, will trial when patient's diet is advanced.  Continues to suffer from Sepsis r/t UTI, poss aspiration PNA -> to undergo SLP eval prior to diet advancement per MD. Pt is bedbound with many bruises longterm xaralto use; has palliative care consult pending as well.  Nutrition-Focused physical exam completed. Findings are no fat depletion, no muscle depletion, and no edema.   Labs and Medications reviewed.   Diet Order:  Diet NPO time specified Except for: Sips with Meds  Skin:  Wound (see comment) (Bruising to multiple locations)  Last BM:  4/17  Height:   Ht  Readings from Last 1 Encounters:  01/08/16 5\' 2"  (1.575 m)    Weight:   Wt Readings from Last 1 Encounters:  01/08/16 165 lb 9.1 oz (75.1 kg)    Ideal Body Weight:  50 kg  BMI:  Body mass index is 30.27 kg/(m^2).  Estimated Nutritional Needs:   Kcal:  1400-1700 (25-30 cal/kg ABW)  Protein:  75-90 grams  Fluid:  >/= 1.4L  EDUCATION NEEDS:   No education needs identified at this time  Caitlin Hampton. Caitlin Westra, MS, RD LDN After Hours/Weekend Pager [phone]

## 2016-01-09 NOTE — Consult Note (Signed)
Consultation Note Date: 01/09/2016   Patient Name: Caitlin Hampton  DOB: Nov 01, 1945  MRN: [id number]  Age / Sex: 70 y.o., female  PCP: Gildardo Cranker, DO Referring Physician: Thurnell Lose, MD  Reason for Consultation: Establishing goals of care  70 yo female with dementia, CHF, Aflutter, Anxiety, COPD on 3-4 L of oxygen at baseline, who recently had skin grafted to her left thigh due to a non-healing ulceration.  She presents from SNF with altered mental status. On admit she is found to have a UTI and pneumonia.  The pneumonia is suspected to be due to aspiration.  Clinical Assessment/Narrative: Caitlin Hampton is demented and unable to give history herself.  Her sister Caitlin Hampton is at bedside.   Caitlin Hampton is her HCPOA.  Her brother Caitlin Hampton is her financial POA.  Caitlin Hampton has no children.  Her husband and parents are deceased.  She lived with Caitlin Hampton for 3 years, but from 12/19 - 10/03/15 due to a COPD exacerbation and pneumonia.  She also had a right lower extremity hematoma that required surgical evacuation and skin grafting.  Per her sister, the patient has not been the same since.  Caitlin Hampton states that Caitlin Hampton has lived at [place] since and has lost 70 lbs.  Interestingly her parents both lived at [place] as well at some point.  She eats no solid food, but will drink liquids.  Caitlin Hampton states Shanaya has been depressed for the last 6 months.  She used to be a Insurance claims handler and now enjoys watching mystery/detective shows on TV.  She is no longer able to transfer on her own and requires a hoyer lift.  Caitlin Hampton has elected DNR for Caitlin Hampton.  She is focused on her quality of life rather than quantity.  She has also be contacted by Palliative Medicine thru [facility] ([place]).  We discussed anticoagulation.  Caitlin Hampton is quite upset with the severe bruising Caitlin Hampton has and she would like the anticoagulation to be discontinued.  She understands there is a increased risk of blood clots, PE and  stroke.    We completed a MOST form.  At this point Caitlin Hampton still wants Natalye to return to the hospital  - "One more time if needed" - but she admits that bouncing back and forth to the hospital would not be good for Caitlin Hampton.  She does want to avoid the ICU.  She reconfirmed the DNR order.  Caitlin Hampton commented that she feels it is appropriate to put Caitlin Hampton's health in God's hands.  Contacts/Participants in Discussion: Patient and her sister Caitlin Hampton. Primary Decision Maker: Caitlin Hampton   SUMMARY OF RECOMMENDATIONS D/C anticoagulation.  Start Asa 81 mg.  Family accepts increased risk of blood clots/stroke/PE DNR / DNI Continue antibiotics and IVF in indicated. Engaged with Palliative Care ([facility]) as an outpatient.   Family values Quality over Quantity of life for Ms. Kemnitz.  Code Status/Advance Care Planning: DNR   Other Directives:None  Symptom Management:   Trial Nystatin for possible thrush  Palliative Prophylaxis:   Aspiration and Delirium Protocol  Psycho-social/Spiritual:  Support System: Adequate Desire for further Chaplaincy support: yes Additional Recommendations: Caregiving  Support/Resources  Prognosis: difficult to determine but given sedentary status, recurrent hospitalizations, discontinuation of coumadin, dementia and 70 lbs weight loss in 4 months  - - likely less than 6 months.  Discharge Planning: [place] for rehab with Palliative care service follow-up   Chief Complaint/ Primary Diagnoses: Present on Admission:  . Sepsis ([place]) . Elevated troponin . Atrial flutter ([place])  I  have reviewed the medical record, interviewed the patient and family, and examined the patient. The following aspects are pertinent.  Past Medical History  Diagnosis Date  . Hypertension   . Thyroid disease   . COPD (chronic obstructive pulmonary disease) ([place])   . Anxiety   . H/O hiatal hernia   . Hypothyroidism   . Shortness of breath   . Atrial flutter ([place]) 09/07/2012     , RVR/ hospital 12/13- rate control and Echo normal- on xarelto, when spontaneously converted she had bradycardia on Metoprolol and Digoxin  . GERD (gastroesophageal reflux disease)   . Rhinitis   . Parotitis     , on CT of face- 12/13  . CHF (congestive heart failure) ([place])   . Hx of echocardiogram     Echo (8/15):  EF 60-65%  . NSTEMI (non-ST elevated myocardial infarction) ([place])   . CAP (community acquired pneumonia) 09/14/2015   Social History   Social History  . Marital Status: Widowed    Spouse Name: N/A  . Number of Children: N/A  . Years of Education: N/A   Social History Main Topics  . Smoking status: Former Smoker -- 1.50 packs/day    Types: Cigarettes    Quit date: 07/23/2012  . Smokeless tobacco: None  . Alcohol Use: No     Comment: occassionally  . Drug Use: No  . Sexual Activity: No   Other Topics Concern  . None   Social History Narrative   Family History  Problem Relation Age of Onset  . CAD Mother   . Stroke Mother   . Prostate cancer Father   . Dementia Father    Scheduled Meds: . antiseptic oral rinse  7 mL Mouth Rinse q12n4p  . arformoterol  15 mcg Nebulization BID  . atorvastatin  10 mg Oral Daily  . budesonide  0.25 mg Nebulization BID  . chlorhexidine  15 mL Mouth Rinse BID  . [START ON 01/10/2016] Chlorhexidine Gluconate Cloth  6 each Topical Q0600  . diltiazem  180 mg Oral Daily  . ipratropium  0.5 mg Nebulization QID  . levothyroxine  50 mcg Intravenous Daily  . mupirocin ointment  1 application Nasal BID  . pantoprazole (PROTONIX) IV  40 mg Intravenous Q24H  . piperacillin-tazobactam  3.375 g Intravenous Once  . piperacillin-tazobactam (ZOSYN)  IV  3.375 g Intravenous 3 times per day  . rivaroxaban  15 mg Oral Daily  . vancomycin  750 mg Intravenous Q24H   Continuous Infusions: . sodium chloride     PRN Meds:.albuterol, diltiazem Medications Prior to Admission:  Prior to Admission medications   Medication Sig Start Date End  Date Taking? Authorizing Provider  acetaminophen (TYLENOL) 500 MG tablet Take 1,000 mg by mouth every 6 (six) hours as needed for mild pain.   Yes Historical Provider, MD  albuterol (PROVENTIL) (2.5 MG/3ML) 0.083% nebulizer solution Take 2.5 mg by nebulization every 6 (six) hours as needed for wheezing or shortness of breath.   Yes Historical Provider, MD  atorvastatin (LIPITOR) 10 MG tablet Take 10 mg by mouth daily at 6 PM.  05/03/14  Yes Historical Provider, MD  bisacodyl (DULCOLAX) 10 MG suppository Place 1 suppository (10 mg total) rectally daily as needed for moderate constipation. 10/03/15  Yes Albertine Patricia, MD  bisacodyl (DULCOLAX) 5 MG EC tablet Take 2 tablets (10 mg total) by mouth daily as needed for moderate constipation. 10/03/15  Yes Albertine Patricia, MD  budesonide-formoterol (SYMBICORT) 160-4.5 MCG/ACT inhaler Inhale  2 puffs into the lungs 2 (two) times daily. 9am, 5pm   Yes Historical Provider, MD  Cholecalciferol (VITAMIN D3) 50000 units CAPS Take 50,000 Units by mouth every Saturday.   Yes Historical Provider, MD  cloNIDine (CATAPRES) 0.1 MG tablet Take 1 tablet (0.1 mg total) by mouth 2 (two) times daily. Patient taking differently: Take 0.1 mg by mouth 2 (two) times daily. 9am, 5pm 10/03/15  Yes Albertine Patricia, MD  diltiazem (CARDIZEM CD) 180 MG 24 hr capsule Take 1 capsule (180 mg total) by mouth daily. 10/03/15  Yes Albertine Patricia, MD  furosemide (LASIX) 40 MG tablet Take 1.5 tablets (60 mg total) by mouth 2 (two) times daily. Patient taking differently: Take 60 mg by mouth 2 (two) times daily. 9am, 5pm 10/03/15  Yes Albertine Patricia, MD  gabapentin (NEURONTIN) 100 MG capsule Take 100 mg by mouth 3 (three) times daily. 12pm, 4pm, 8pm   Yes Historical Provider, MD  HYDROcodone-acetaminophen (NORCO/VICODIN) 5-325 MG tablet Take 1 tablet by mouth every 6 (six) hours as needed for moderate pain.   Yes Historical Provider, MD  iron polysaccharides (NIFEREX) 150 MG capsule  Take 1 capsule (150 mg total) by mouth 2 (two) times daily. 12/16/14  Yes Barton Dubois, MD  levothyroxine (SYNTHROID, LEVOTHROID) 100 MCG tablet Take 100 mcg by mouth daily before breakfast.    Yes Historical Provider, MD  mirtazapine (REMERON) 15 MG tablet Take 15 mg by mouth at bedtime.   Yes Historical Provider, MD  Nutritional Supplements (NUTRITIONAL SUPPLEMENT PO) Take by mouth 2 (two) times daily. "Magic cup" 9am, 5pm   Yes Historical Provider, MD  OXYGEN Inhale into the lungs continuous. 3L   Yes Historical Provider, MD  pantoprazole (PROTONIX) 40 MG tablet Take 1 tablet (40 mg total) by mouth daily at 12 noon. 10/03/15  Yes Silver Huguenin Elgergawy, MD  potassium chloride (K-DUR) 10 MEQ tablet Take 10 mEq by mouth daily. 12/26/15  Yes Historical Provider, MD  Rivaroxaban (XARELTO) 20 MG TABS Take 1 tablet (20 mg total) by mouth daily with breakfast. 09/12/12  Yes Lavone Orn, MD  senna-docusate (SENOKOT-S) 8.6-50 MG tablet Take 2 tablets by mouth 2 (two) times daily. Patient taking differently: Take 2 tablets by mouth 2 (two) times daily. 9am, 5pm 10/03/15  Yes Albertine Patricia, MD  tiotropium (SPIRIVA) 18 MCG inhalation capsule Place 18 mcg into inhaler and inhale daily.   Yes Historical Provider, MD  Amino Acids-Protein Hydrolys (FEEDING SUPPLEMENT, PRO-STAT SUGAR FREE 64,) LIQD Take 30 mLs by mouth 2 (two) times daily. Patient not taking: Reported on 01/08/2016 10/03/15   Silver Huguenin Elgergawy, MD  arformoterol (BROVANA) 15 MCG/2ML NEBU Take 2 mLs (15 mcg total) by nebulization 2 (two) times daily. Patient not taking: Reported on 01/08/2016 10/03/15   Silver Huguenin Elgergawy, MD  budesonide (PULMICORT) 0.25 MG/2ML nebulizer solution Take 2 mLs (0.25 mg total) by nebulization 2 (two) times daily. Patient not taking: Reported on 01/08/2016 12/16/14   Barton Dubois, MD  potassium chloride SA (K-DUR,KLOR-CON) 20 MEQ tablet TAKE ONE TABLET EACH DAY Patient not taking: Reported on 01/08/2016 06/29/15   Jerline Pain, MD   No Known Allergies  Review of Systems:  Patient is pleasantly demented and unable to answer   Physical Exam Obese, chronically ill appearing female, NAD, comfortable.  Sister at bedside CV S1, S2, irregular Respirations:  No increased work of breathing. Extremities: 1+ edema.  Numerous bruises on extremities.      Vital Signs: BP  112/98 mmHg  Pulse 120  Temp(Src) 97.7 F (36.5 C) (Oral)  Resp 19  Ht 5\' 2"  (1.575 m)  Wt 75.1 kg (165 lb 9.1 oz)  BMI 30.27 kg/m2  SpO2 100%  SpO2: SpO2: 100 % O2 Device:SpO2: 100 % O2 Flow Rate: .O2 Flow Rate (L/min): 5 L/min  IO: Intake/output summary:  Intake/Output Summary (Last 24 hours) at 01/09/16 1037 Last data filed at 01/09/16 0600  Gross per 24 hour  Intake   2325 ml  Output    500 ml  Net   1825 ml    LBM: Last BM Date: 01/08/16 Baseline Weight: Weight: 75.1 kg (165 lb 9.1 oz) Most recent weight: Weight: 75.1 kg (165 lb 9.1 oz)      Palliative Assessment/Data:  Flowsheet Rows        Most Recent Value   Intake Tab    Referral Department  Hospitalist   Unit at Time of Referral  Intermediate Care Unit   Palliative Care Primary Diagnosis  Neurology   Date Notified  01/08/16   Palliative Care Type  New Palliative care   Reason for referral  Clarify Goals of Care   Date of Admission  01/08/16   Date first seen by Palliative Care  01/09/16   # of days Palliative referral response time  1 Day(s)   # of days IP prior to Palliative referral  0   Clinical Assessment    Palliative Performance Scale Score  30%   Psychosocial & Spiritual Assessment    Palliative Care Outcomes       Additional Data Reviewed:  CBC:    Component Value Date/Time   WBC 12.7* 01/09/2016 0248   WBC 10.1 11/17/2015   HGB 11.0* 01/09/2016 0248   HCT 35.6* 01/09/2016 0248   PLT 217 01/09/2016 0248   MCV 93.4 01/09/2016 0248   NEUTROABS 13.7* 01/08/2016 0241   LYMPHSABS 0.1* 01/08/2016 0241   MONOABS 0.4 01/08/2016 0241   EOSABS  0.1 01/08/2016 0241   BASOSABS 0.0 01/08/2016 0241   Comprehensive Metabolic Panel:    Component Value Date/Time   NA 141 01/09/2016 0248   NA 140 10/06/2015   K 3.6 01/09/2016 0248   CL 108 01/09/2016 0248   CO2 16* 01/09/2016 0248   BUN 43* 01/09/2016 0248   BUN 36* 10/06/2015   CREATININE 1.76* 01/09/2016 0248   CREATININE 1.0 10/06/2015   GLUCOSE 88 01/09/2016 0248   CALCIUM 7.6* 01/09/2016 0248   AST 68* 01/07/2016 1712   ALT 41 01/07/2016 1712   ALKPHOS 97 01/07/2016 1712   BILITOT 1.2 01/07/2016 1712   PROT 5.8* 01/07/2016 1712   ALBUMIN 2.4* 01/07/2016 1712     Time In: 10:00  Time Out: 11:10 Time Total: 70 min.  Greater than 50%  of this time was spent counseling and coordinating care related to the above assessment and plan.  Signed by: Imogene Burn, PA-C Palliative Medicine Pager: [phone]  01/09/2016, 10:37 AM  Please contact Palliative Medicine Team phone at [phone] for questions and concerns.

## 2016-01-09 NOTE — Evaluation (Signed)
Physical Therapy Evaluation Patient Details Name: Caitlin Hampton MRN: [id number] DOB: [birth year] Today's Date: 01/09/2016   History of Present Illness  Caitlin Hampton is a 70 y.o. woman with a history of COPD with chronic O2 dependence (3-4L [place] at baseline), HTN, chronic afib/flutter (anticoagulated with Xarelto), diastolic heart failure, CAD, and dementia who has been a resident in a skilled nursing facility since her admission here in December for HCAP and right leg ulcer.    Clinical Impression  Pt admitted with above diagnosis. Pt currently with functional limitations due to the deficits listed below (see PT Problem List). Unsure of pt's baseline at SNF, but required Max +2 for mobility today. Did not attempt to transfer due to Bilateral plantar flexion contractures and pt had increased pain with all movement. Also, but has skin breakdown and pressure relief cushion for chair had not yet arrived at time of evaluation. Nursing should use lift to get pt to chair. Pt will benefit from skilled PT to increase their independence and safety with mobility to allow discharge to the venue listed below.  Will continue to assess if pt will be able to make progress with therapy.      Follow Up Recommendations SNF;Supervision/Assistance - 24 hour    Equipment Recommendations  None recommended by PT    Recommendations for Other Services OT consult     Precautions / Restrictions Precautions Precautions: Fall Restrictions Weight Bearing Restrictions: No      Mobility  Bed Mobility Overal bed mobility: Needs Assistance;+2 for physical assistance Bed Mobility: Supine to Sit;Sit to Supine     Supine to sit: Max assist;+2 for physical assistance;HOB elevated Sit to supine: +2 for physical assistance;Max assist   General bed mobility comments: Pt needs Max +2 to get EOB. Pt sat EOB for ~5 mins.   Transfers                 General transfer comment: unable at this time  Ambulation/Gait                Stairs            Wheelchair Mobility    Modified Rankin (Stroke Patients Only)       Balance Overall balance assessment: Needs assistance Sitting-balance support: Feet unsupported;Bilateral upper extremity supported Sitting balance-Leahy Scale: Poor Sitting balance - Comments: Pt needs Min A to maintain upright in sitting.  Postural control: Left lateral lean                                   Pertinent Vitals/Pain Pain Assessment: Faces Faces Pain Scale: Hurts whole lot Pain Location: All over with mobility Pain Descriptors / Indicators: Grimacing;Guarding;Discomfort Pain Intervention(s): Limited activity within patient's tolerance;Monitored during session;Repositioned    Home Living Family/patient expects to be discharged to:: Skilled nursing facility                 Additional Comments: Pt was living with her sister prior to last admission.     Prior Function Level of Independence: Needs assistance         Comments: unsure of how much she was able to do at SNF.      Hand Dominance   Dominant Hand: Right    Extremity/Trunk Assessment   Upper Extremity Assessment: Defer to OT evaluation           Lower Extremity Assessment: RLE deficits/detail;LLE deficits/detail RLE Deficits /  Details: Grossly 2+/5. Plantar flexion contracture LLE Deficits / Details: Grossly 2+/5, plantar flexion contracture  Cervical / Trunk Assessment: Kyphotic  Communication   Communication: HOH  Cognition Arousal/Alertness: Lethargic Behavior During Therapy: Flat affect Overall Cognitive Status: No family/caregiver present to determine baseline cognitive functioning       Memory: Decreased short-term memory              General Comments General comments (skin integrity, edema, etc.): Pt brusing over her whole body and multiple wounds.     Exercises        Assessment/Plan    PT Assessment Patient needs continued PT  services  PT Diagnosis Difficulty walking;Generalized weakness;Acute pain;Altered mental status   PT Problem List Decreased strength;Decreased range of motion;Decreased activity tolerance;Decreased balance;Decreased mobility;Decreased coordination;Decreased cognition;Decreased knowledge of use of DME;Cardiopulmonary status limiting activity;Pain;Impaired tone  PT Treatment Interventions DME instruction;Gait training;Functional mobility training;Therapeutic activities;Therapeutic exercise;Patient/family education   PT Goals (Current goals can be found in the Care Plan section) Acute Rehab PT Goals Patient Stated Goal: to get out of bed PT Goal Formulation: With patient Time For Goal Achievement: 01/23/16 Potential to Achieve Goals: Fair    Frequency Min 2X/week   Barriers to discharge Decreased caregiver support Pt requiring +2 A for all mobility.     Co-evaluation               End of Session Equipment Utilized During Treatment: Gait belt;Oxygen Activity Tolerance: Patient tolerated treatment well Patient left: in bed;with call bell/phone within reach;with bed alarm set;with nursing/sitter in room Nurse Communication: Mobility status         Time: [id number] PT Time Calculation (min) (ACUTE ONLY): 19 min   Charges:   PT Evaluation $PT Eval Moderate Complexity: 1 Procedure     PT G Codes:       Colon Branch, SPT Colon Branch 01/09/2016, 12:18 PM

## 2016-01-09 NOTE — Progress Notes (Signed)
[place] PROGRESS NOTE                                                                                                                                                                                                             Patient Demographics:    Caitlin Hampton, is a 70 y.o. female, DOB - [birth year], CN:[id number]  Admit date - 01/07/2016   Admitting Physician Lily Kocher, MD  Outpatient Primary MD for the patient is Gildardo Cranker, DO  LOS - 2  Outpatient Specialists:   Chief Complaint  Patient presents with  . Shortness of Breath       Brief Narrative      Subjective:    Caitlin Hampton today has, No headache, No chest pain, No abdominal pain - No Nausea, No new weakness tingling or numbness, No Cough - SOB. Unreliable historian   Assessment  & Plan :     1.Sepsis due to UTI and likely aspiration pneumonia - appears nontoxic, improved after IVF & empiric IV antibiotics which are vancomycin and Zosyn, follow cultures, speech to evaluate from aspiration standpoint.  Her overall quality of life appears very poor, she is severely deconditioned, most likely she is bedbound, has multiple bruises all over. Will involve palliative care for goals of care for Care and long-term xaralto use in the setting of diffuse generalized bruising, she is DO NOT RESUSCITATE for now had discussions with patient's sister on 01/08/2016. Clearly explained poor quality of life and poor long-term prognosis .  2. ARF. Likely due to sepsis in the setting of patient being on Lasix and potassium. Improving with hydration, renal ultrasound stable.  3. History of Chronic A. fib/flutter. [place] vasc 2 score of greater than 4. On xaralto, dose to be adjusted by pharmacy, she has multiple bruises all over, I will discuss this with family to reconsider long-term anticoagulation. Continue Cardizem for rate control orally along with as needed IV. She follows with Dr. Marlou Porch  for her cardiology issues, will get their input on long-term xaralto use.  4. History of dementia, generalized deconditioning, poor functional status. Continue home medications, supportive care, at risk for delirium. Minimize benzodiazepines and narcotics.  5. Hypothyroidism. Continue home dose Synthroid.  6. COPD. At baseline. Uses 4 L nasal cannula oxygen, continue oxygen supplementation along with nebulizer treatments.  7. Dyslipidemia. On statin continue.  8. GERD. On PPI.  9. History of CAD. Stable. Continue  medications for secondary prevention which include statin, no aspirin as she is on xaralto. Chest pain-free.    Code Status : DNR, prognosis appears poor, DW sister 01-08-16  Family Communication  : Called the 4 listed numbers for sister and brother but no response  Disposition Plan  : SNF 1-2 days  Barriers For Discharge : UTI/sepsis  Consults  :  Pall Care  Procedures  :   CT head nonacute  DVT Prophylaxis  :  Xaralto  Lab Results  Component Value Date   PLT 217 01/09/2016    Antibiotics  :    Anti-infectives    Start     Dose/Rate Route Frequency Ordered Stop   01/08/16 1800  vancomycin (VANCOCIN) IVPB 750 mg/150 ml premix     750 mg 150 mL/hr over 60 Minutes Intravenous Every 24 hours 01/07/16 1804     01/08/16 0000  piperacillin-tazobactam (ZOSYN) IVPB 3.375 g     3.375 g 12.5 mL/hr over 240 Minutes Intravenous 3 times per day 01/07/16 1804     01/07/16 1830  vancomycin (VANCOCIN) 1,500 mg in sodium chloride 0.9 % 500 mL IVPB     1,500 mg 250 mL/hr over 120 Minutes Intravenous  Once 01/07/16 1755 01/07/16 2143   01/07/16 1800  piperacillin-tazobactam (ZOSYN) IVPB 3.375 g     3.375 g 100 mL/hr over 30 Minutes Intravenous  Once 01/07/16 1755          Objective:   Filed Vitals:   01/09/16 0500 01/09/16 0545 01/09/16 0600 01/09/16 0912  BP:  116/79 128/98 112/98  Pulse: 118   120  Temp:      TempSrc:      Resp: 30 26 25 19   Height:        Weight:      SpO2: 99%   100%    Wt Readings from Last 3 Encounters:  01/08/16 75.1 kg (165 lb 9.1 oz)  12/21/15 77.735 kg (171 lb 6 oz)  11/15/15 82.464 kg (181 lb 12.8 oz)     Intake/Output Summary (Last 24 hours) at 01/09/16 1008 Last data filed at 01/09/16 0600  Gross per 24 hour  Intake   2325 ml  Output    500 ml  Net   1825 ml     Physical Exam  Awake , able answer basic questions and follow basic commands, No new F.N deficits, Normal affect [place].AT,PERRAL Supple Neck,No JVD, No cervical lymphadenopathy appriciated.  Symmetrical Chest wall movement, Good air movement bilaterally, CTAB RRR,No Gallops,Rubs or new Murmurs, No Parasternal Heave +ve B.Sounds, Abd Soft, No tenderness, No organomegaly appriciated, No rebound - guarding or rigidity. No Cyanosis, Clubbing or edema, No new Rash , Multiple diffuse bruises, chronic contractures       Data Review:    CBC  Recent Labs Lab 01/07/16 1712 01/07/16 1718 01/08/16 0241 01/09/16 0248  WBC 19.6*  --  14.4* 12.7*  HGB 12.7 15.6* 10.5* 11.0*  HCT 39.9 46.0 31.9* 35.6*  PLT 283  --  241 217  MCV 92.4  --  93.0 93.4  MCH 29.4  --  30.6 28.9  MCHC 31.8  --  32.9 30.9  RDW 15.8*  --  16.4* 16.1*  LYMPHSABS 1.0  --  0.1*  --   MONOABS 0.6  --  0.4  --   EOSABS 0.0  --  0.1  --   BASOSABS 0.0  --  0.0  --     Chemistries   Recent Labs Lab  01/07/16 1712 01/07/16 1718 01/08/16 0241 01/09/16 0248  NA 132* 131* 134* 141  K 4.0 3.8 3.4* 3.6  CL 91* 92* 99* 108  CO2 25  --  22 16*  GLUCOSE 107* 101* 90 88  BUN 55* 51* 54* 43*  CREATININE 2.38* 2.40* 2.00* 1.76*  CALCIUM 8.4*  --  7.4* 7.6*  AST 68*  --   --   --   ALT 41  --   --   --   ALKPHOS 97  --   --   --   BILITOT 1.2  --   --   --    ------------------------------------------------------------------------------------------------------------------ No results for input(s): CHOL, HDL, LDLCALC, TRIG, CHOLHDL, LDLDIRECT in the last 72 hours.  Lab  Results  Component Value Date   HGBA1C 5.7* 01/08/2016   ------------------------------------------------------------------------------------------------------------------ No results for input(s): TSH, T4TOTAL, T3FREE, THYROIDAB in the last 72 hours.  Invalid input(s): FREET3 ------------------------------------------------------------------------------------------------------------------ No results for input(s): VITAMINB12, FOLATE, FERRITIN, TIBC, IRON, RETICCTPCT in the last 72 hours.  Coagulation profile  Recent Labs Lab 01/07/16 1712  INR 2.53*    No results for input(s): DDIMER in the last 72 hours.  Cardiac Enzymes No results for input(s): CKMB, TROPONINI, MYOGLOBIN in the last 168 hours.  Invalid input(s): CK ------------------------------------------------------------------------------------------------------------------    Component Value Date/Time   BNP 309.1* 01/07/2016 1712    Inpatient Medications  Scheduled Meds: . antiseptic oral rinse  7 mL Mouth Rinse q12n4p  . arformoterol  15 mcg Nebulization BID  . atorvastatin  10 mg Oral Daily  . budesonide  0.25 mg Nebulization BID  . chlorhexidine  15 mL Mouth Rinse BID  . [START ON 01/10/2016] Chlorhexidine Gluconate Cloth  6 each Topical Q0600  . diltiazem  180 mg Oral Daily  . ipratropium  0.5 mg Nebulization QID  . levothyroxine  50 mcg Intravenous Daily  . mupirocin ointment  1 application Nasal BID  . pantoprazole (PROTONIX) IV  40 mg Intravenous Q24H  . piperacillin-tazobactam  3.375 g Intravenous Once  . piperacillin-tazobactam (ZOSYN)  IV  3.375 g Intravenous 3 times per day  . rivaroxaban  15 mg Oral Daily  . vancomycin  750 mg Intravenous Q24H   Continuous Infusions: . sodium chloride     PRN Meds:.albuterol, diltiazem  Micro Results Recent Results (from the past 240 hour(s))  Blood culture (routine x 2)     Status: None (Preliminary result)   Collection Time: 01/07/16  5:12 PM  Result Value  Ref Range Status   Specimen Description BLOOD RIGHT ARM  Final   Special Requests BOTTLES DRAWN AEROBIC AND ANAEROBIC 5CC  Final   Culture  Setup Time   Final    GRAM POSITIVE COCCI IN CLUSTERS AEROBIC BOTTLE ONLY CRITICAL RESULT CALLED TO, READ BACK BY AND VERIFIED WITH: Corbin Ade 01/09/16 @ 28 M VESTAL    Culture GRAM POSITIVE COCCI  Final   Report Status PENDING  Incomplete  Blood culture (routine x 2)     Status: None (Preliminary result)   Collection Time: 01/07/16  6:30 PM  Result Value Ref Range Status   Specimen Description BLOOD LEFT ARM  Final   Special Requests BOTTLES DRAWN AEROBIC ONLY 5CC  Final   Culture  Setup Time   Final    GRAM POSITIVE COCCI IN CLUSTERS AEROBIC BOTTLE ONLY CRITICAL RESULT CALLED TO, READ BACK BY AND VERIFIED WITH: A PETTIFORD,RN @0525  01/09/16 MKELLY    Culture NO GROWTH < 24 HOURS  Final  Report Status PENDING  Incomplete  MRSA PCR Screening     Status: Abnormal   Collection Time: 01/08/16  4:28 PM  Result Value Ref Range Status   MRSA by PCR POSITIVE (A) NEGATIVE Final    Comment:        The GeneXpert MRSA Assay (FDA approved for NASAL specimens only), is one component of a comprehensive MRSA colonization surveillance program. It is not intended to diagnose MRSA infection nor to guide or monitor treatment for MRSA infections. RESULT CALLED TO, READ BACK BY AND VERIFIED WITH: T.NEILSON,RN BY L.PITT AT 2158     Radiology Reports Ct Head Wo Contrast  01/07/2016  CLINICAL DATA:  Altered mental status over the last few days. Hypoxia with fluid retention. EXAM: CT HEAD WITHOUT CONTRAST TECHNIQUE: Contiguous axial images were obtained from the base of the skull through the vertex without intravenous contrast. COMPARISON:  11/05/2015 and 09/07/2012 examinations. FINDINGS: Brain: There is no evidence of acute intracranial hemorrhage, mass lesion, brain edema or extra-axial fluid collection. The ventricles and subarachnoid spaces are prominent  but stable. There is no CT evidence of acute cortical infarction. There are chronic small vessel ischemic changes in the periventricular white matter and basal ganglia. Intracranial vascular calcifications are present. A small amount of air within the cavernous sinuses and infratemporal fossa is likely incidental, typically related to the presence of a an IV line. Bones/sinuses/visualized face: Mucosal thickening in the left maxillary sinus has improved. The visualized paranasal sinuses, mastoid air cells and middle ears are otherwise clear. The calvarium is intact. IMPRESSION: No acute intracranial findings. Stable atrophy and chronic small vessel ischemic changes. Interval improved left maxillary sinus mucosal thickening. Electronically Signed   By: Richardean Sale M.D.   On: 01/07/2016 20:25   US Renal  01/08/2016  CLINICAL DATA:  Acute renal failure. EXAM: RENAL / URINARY TRACT ULTRASOUND COMPLETE COMPARISON:  One-view abdomen 09/25/2015. FINDINGS: Right Kidney: Length: 10.1 cm, within normal limits. Normal echogenicity is present. There are 2 exophytic cysts. These both appear normal. They measure 3.5 x 3.4 x 3.4 cm at the lower pole and 1.2 x 1.2 x 1.5 cm near the upper pole. No other mass lesion or stone is present. Left Kidney: Length: 9.3 cm, within normal limits. Echogenicity within normal limits. No mass or hydronephrosis visualized. Bladder: The bladder is decompressed.  A Foley catheter is visualized. IMPRESSION: 1. Foley catheter in place. 2. Benign appearing cysts in the right kidney. 3. Normal echogenicity of the renal parenchyma without evidence for obstruction. Electronically Signed   By: San Morelle M.D.   On: 01/08/2016 12:15   Dg Chest Portable 1 View  01/07/2016  CLINICAL DATA:  Hypotensive.  Short of breath. EXAM: PORTABLE CHEST 1 VIEW COMPARISON:  10/03/2015 FINDINGS: Midline trachea. Cardiomegaly accentuated by AP portable technique. Atherosclerosis in the transverse aorta. No  pleural effusion or pneumothorax. Mild left infrahilar atelectasis. Patchy right base airspace disease persists. Inferior right upper lobe aeration is improved. IMPRESSION: Persistent or recurrent right base airspace disease, which could represent infection or atelectasis. Cardiomegaly with aortic atherosclerosis.  No congestive failure. Electronically Signed   By: Abigail Miyamoto M.D.   On: 01/07/2016 17:55    Time Spent in minutes  25   SINGH,PRASHANT K M.D on 01/09/2016 at 10:08 AM  Between 7am to 7pm - Pager - [phone]  After 7pm go to [email or web address] - password [facility]  [facility] -  Office  [phone]

## 2016-01-09 NOTE — Consult Note (Signed)
Cardiologist:  Marlou Porch Reason for Consult: Input regarding long term [facility] Referring Physician: [name] is an 70 y.o. female.  HPI:   The patient is a 70 yo demented, bed-bound, severely deconditioned female with a history of chronic  atrial afib/flutter, HTN,  hypothyroidism, COPD, CHF. She is on Xarelto '15mg'$ .  She presented with altered mental status and extensive bruising to her chest, arms, etc...  She is being treated for sepsis due to UTI and PNA, ARF.  She is not able to provide a accurate or complete history.  Family were not present.    Past Medical History  Diagnosis Date  . Hypertension   . Thyroid disease   . COPD (chronic obstructive pulmonary disease) ([place])   . Anxiety   . H/O hiatal hernia   . Hypothyroidism   . Shortness of breath   . Atrial flutter ([place]) 09/07/2012    , RVR/ hospital 12/13- rate control and Echo normal- on xarelto, when spontaneously converted she had bradycardia on Metoprolol and Digoxin  . GERD (gastroesophageal reflux disease)   . Rhinitis   . Parotitis     , on CT of face- 12/13  . CHF (congestive heart failure) ([place])   . Hx of echocardiogram     Echo (8/15):  EF 60-65%  . NSTEMI (non-ST elevated myocardial infarction) ([place])   . CAP (community acquired pneumonia) 09/14/2015    Past Surgical History  Procedure Laterality Date  . Tonsillectomy    . Breast surgery      benign R brest CA  . Cardioversion N/A 04/02/2014    Procedure: CARDIOVERSION;  Surgeon: Josue Hector, MD;  Location: [place];  Service: Cardiovascular;  Laterality: N/A;  . Cardioversion N/A 08/17/2014    Procedure: CARDIOVERSION;  Surgeon: [name], MD;  Location: [place];  Service: Cardiovascular;  Laterality: N/A;  . I&d extremity Right 09/18/2015    Procedure: IRRIGATION AND DEBRIDEMENT RIGHT LOWER LEG.;  Surgeon: Mcarthur Rossetti, MD;  Location: [place];  Service: Orthopedics;  Laterality: Right;  . Application of wound vac Right 09/18/2015   Procedure: APPLICATION OF WOUND VAC RIGHT LOWER LEG;  Surgeon: Mcarthur Rossetti, MD;  Location: [place];  Service: Orthopedics;  Laterality: Right;  . Skin split graft Right 09/23/2015    Procedure: SKIN GRAFT SPLIT THICKNESS RIGHT LEG;  Surgeon: Mcarthur Rossetti, MD;  Location: [place];  Service: Orthopedics;  Laterality: Right;  Right Thigh    Family History  Problem Relation Age of Onset  . CAD Mother   . Stroke Mother   . Prostate cancer Father   . Dementia Father     Social History:  reports that she quit smoking about 3 years ago. Her smoking use included Cigarettes. She smoked 1.50 packs per day. She does not have any smokeless tobacco history on file. She reports that she does not drink alcohol or use illicit drugs.  Allergies: No Known Allergies  Medications:  Scheduled Meds: . antiseptic oral rinse  7 mL Mouth Rinse q12n4p  . arformoterol  15 mcg Nebulization BID  . atorvastatin  10 mg Oral Daily  . budesonide  0.25 mg Nebulization BID  . chlorhexidine  15 mL Mouth Rinse BID  . [START ON 01/10/2016] Chlorhexidine Gluconate Cloth  6 each Topical Q0600  . diltiazem  180 mg Oral Daily  . ipratropium  0.5 mg Nebulization QID  . levothyroxine  50 mcg Intravenous Daily  . mupirocin ointment  1 application Nasal  BID  . nystatin  5 mL Oral QID  . pantoprazole (PROTONIX) IV  40 mg Intravenous Q24H  . piperacillin-tazobactam  3.375 g Intravenous Once  . piperacillin-tazobactam (ZOSYN)  IV  3.375 g Intravenous 3 times per day  . rivaroxaban  15 mg Oral Daily  . vancomycin  750 mg Intravenous Q24H   Continuous Infusions: . sodium chloride 50 mL/hr at 01/09/16 1044   PRN Meds:.albuterol, diltiazem   Results for orders placed or performed during the hospital encounter of 01/07/16 (from the past 48 hour(s))  CBC with Differential     Status: Abnormal   Collection Time: 01/07/16  5:12 PM  Result Value Ref Range   WBC 19.6 (H) 4.0 - 10.5 K/uL   RBC 4.32 3.87 - 5.11  MIL/uL   Hemoglobin 12.7 12.0 - 15.0 g/dL   HCT 39.9 36.0 - 46.0 %   MCV 92.4 78.0 - 100.0 fL   MCH 29.4 26.0 - 34.0 pg   MCHC 31.8 30.0 - 36.0 g/dL   RDW 15.8 (H) 11.5 - 15.5 %   Platelets 283 150 - 400 K/uL   Neutrophils Relative % 92 %   Neutro Abs 17.9 (H) 1.7 - 7.7 K/uL   Lymphocytes Relative 5 %   Lymphs Abs 1.0 0.7 - 4.0 K/uL   Monocytes Relative 3 %   Monocytes Absolute 0.6 0.1 - 1.0 K/uL   Eosinophils Relative 0 %   Eosinophils Absolute 0.0 0.0 - 0.7 K/uL   Basophils Relative 0 %   Basophils Absolute 0.0 0.0 - 0.1 K/uL  Comprehensive metabolic panel     Status: Abnormal   Collection Time: 01/07/16  5:12 PM  Result Value Ref Range   Sodium 132 (L) 135 - 145 mmol/L   Potassium 4.0 3.5 - 5.1 mmol/L   Chloride 91 (L) 101 - 111 mmol/L   CO2 25 22 - 32 mmol/L   Glucose, Bld 107 (H) 65 - 99 mg/dL   BUN 55 (H) 6 - 20 mg/dL   Creatinine, Ser 2.38 (H) 0.44 - 1.00 mg/dL   Calcium 8.4 (L) 8.9 - 10.3 mg/dL   Total Protein 5.8 (L) 6.5 - 8.1 g/dL   Albumin 2.4 (L) 3.5 - 5.0 g/dL   AST 68 (H) 15 - 41 U/L   ALT 41 14 - 54 U/L   Alkaline Phosphatase 97 38 - 126 U/L   Total Bilirubin 1.2 0.3 - 1.2 mg/dL   GFR calc non Af Amer 20 (L) >60 mL/min   GFR calc Af Amer 23 (L) >60 mL/min    Comment: (NOTE) The eGFR has been calculated using the CKD EPI equation. This calculation has not been validated in all clinical situations. eGFR's persistently <60 mL/min signify possible Chronic Kidney Disease.    Anion gap 16 (H) 5 - 15  Brain natriuretic peptide     Status: Abnormal   Collection Time: 01/07/16  5:12 PM  Result Value Ref Range   B Natriuretic Peptide 309.1 (H) 0.0 - 100.0 pg/mL  Blood culture (routine x 2)     Status: None (Preliminary result)   Collection Time: 01/07/16  5:12 PM  Result Value Ref Range   Specimen Description BLOOD RIGHT ARM    Special Requests BOTTLES DRAWN AEROBIC AND ANAEROBIC 5CC    Culture  Setup Time      GRAM POSITIVE COCCI IN CLUSTERS AEROBIC BOTTLE  ONLY CRITICAL RESULT CALLED TO, READ BACK BY AND VERIFIED WITH: M PERROW 01/09/16 @ 84 M VESTAL  Culture GRAM POSITIVE COCCI    Report Status PENDING   Protime-INR     Status: Abnormal   Collection Time: 01/07/16  5:12 PM  Result Value Ref Range   Prothrombin Time 26.9 (H) 11.6 - 15.2 seconds   INR 2.53 (H) 0.00 - 1.49  I-Stat Troponin, ED - 0, 3, 6 hours (not at [facility])     Status: None   Collection Time: 01/07/16  5:17 PM  Result Value Ref Range   Troponin i, poc 0.06 0.00 - 0.08 ng/mL   Comment 3            Comment: Due to the release kinetics of cTnI, a negative result within the first hours of the onset of symptoms does not rule out myocardial infarction with certainty. If myocardial infarction is still suspected, repeat the test at appropriate intervals.   I-Stat Chem 8, ED     Status: Abnormal   Collection Time: 01/07/16  5:18 PM  Result Value Ref Range   Sodium 131 (L) 135 - 145 mmol/L   Potassium 3.8 3.5 - 5.1 mmol/L   Chloride 92 (L) 101 - 111 mmol/L   BUN 51 (H) 6 - 20 mg/dL   Creatinine, Ser 2.40 (H) 0.44 - 1.00 mg/dL   Glucose, Bld 101 (H) 65 - 99 mg/dL   Calcium, Ion 1.00 (L) 1.13 - 1.30 mmol/L   TCO2 25 0 - 100 mmol/L   Hemoglobin 15.6 (H) 12.0 - 15.0 g/dL   HCT 46.0 36.0 - 46.0 %  I-Stat Venous Blood Gas, ED (order at [facility] and [facility] only)     Status: Abnormal   Collection Time: 01/07/16  5:19 PM  Result Value Ref Range   pH, Ven 7.405 (H) 7.250 - 7.300   pCO2, Ven 40.5 (L) 45.0 - 50.0 mmHg   pO2, Ven 23.0 (L) 31.0 - 45.0 mmHg   Bicarbonate 25.4 (H) 20.0 - 24.0 mEq/L   TCO2 27 0 - 100 mmol/L   O2 Saturation 40.0 %   Acid-Base Excess 1.0 0.0 - 2.0 mmol/L   Patient temperature HIDE    Sample type VENOUS    Comment NOTIFIED PHYSICIAN   I-Stat CG4 Lactic Acid, ED     Status: Abnormal   Collection Time: 01/07/16  5:22 PM  Result Value Ref Range   Lactic Acid, Venous 2.17 (HH) 0.5 - 2.0 mmol/L   Comment NOTIFIED PHYSICIAN   Urinalysis, Routine w reflex  microscopic (not at [facility])     Status: Abnormal   Collection Time: 01/07/16  6:05 PM  Result Value Ref Range   Color, Urine YELLOW YELLOW   APPearance TURBID (A) CLEAR   Specific Gravity, Urine 1.015 1.005 - 1.030   pH 8.0 5.0 - 8.0   Glucose, UA NEGATIVE NEGATIVE mg/dL   Hgb urine dipstick MODERATE (A) NEGATIVE   Bilirubin Urine NEGATIVE NEGATIVE   Ketones, ur 15 (A) NEGATIVE mg/dL   Protein, ur 100 (A) NEGATIVE mg/dL   Nitrite NEGATIVE NEGATIVE   Leukocytes, UA LARGE (A) NEGATIVE  Urine microscopic-add on     Status: Abnormal   Collection Time: 01/07/16  6:05 PM  Result Value Ref Range   Squamous Epithelial / LPF 0-5 (A) NONE SEEN   WBC, UA TOO NUMEROUS TO COUNT 0 - 5 WBC/hpf   RBC / HPF 6-30 0 - 5 RBC/hpf   Bacteria, UA MANY (A) NONE SEEN   Crystals TRIPLE PHOSPHATE CRYSTALS (A) NEGATIVE  Strep pneumoniae urinary antigen     Status:  None   Collection Time: 01/07/16  6:05 PM  Result Value Ref Range   Strep Pneumo Urinary Antigen NEGATIVE NEGATIVE    Comment:        Infection due to S. pneumoniae cannot be absolutely ruled out since the antigen present may be below the detection limit of the test.   Culture, Urine     Status: Abnormal (Preliminary result)   Collection Time: 01/07/16  6:05 PM  Result Value Ref Range   Specimen Description URINE, CATHETERIZED    Special Requests ADDED 0023 01/08/16    Culture >=100,000 COLONIES/mL PSEUDOMONAS AERUGINOSA (A)    Report Status PENDING   Blood culture (routine x 2)     Status: None (Preliminary result)   Collection Time: 01/07/16  6:30 PM  Result Value Ref Range   Specimen Description BLOOD LEFT ARM    Special Requests BOTTLES DRAWN AEROBIC ONLY 5CC    Culture  Setup Time      GRAM POSITIVE COCCI IN CLUSTERS AEROBIC BOTTLE ONLY CRITICAL RESULT CALLED TO, READ BACK BY AND VERIFIED WITH: A PETTIFORD,RN '@0525'$  01/09/16 MKELLY    Culture NO GROWTH 2 DAYS    Report Status PENDING   I-Stat Troponin, ED - 0, 3, 6 hours (not at  [facility])     Status: None   Collection Time: 01/07/16  7:47 PM  Result Value Ref Range   Troponin i, poc 0.05 0.00 - 0.08 ng/mL   Comment 3            Comment: Due to the release kinetics of cTnI, a negative result within the first hours of the onset of symptoms does not rule out myocardial infarction with certainty. If myocardial infarction is still suspected, repeat the test at appropriate intervals.   I-Stat CG4 Lactic Acid, ED     Status: None   Collection Time: 01/07/16  7:49 PM  Result Value Ref Range   Lactic Acid, Venous 1.05 0.5 - 2.0 mmol/L  I-Stat Troponin, ED - 0, 3, 6 hours (not at [facility])     Status: None   Collection Time: 01/08/16  2:36 AM  Result Value Ref Range   Troponin i, poc 0.04 0.00 - 0.08 ng/mL   Comment 3            Comment: Due to the release kinetics of cTnI, a negative result within the first hours of the onset of symptoms does not rule out myocardial infarction with certainty. If myocardial infarction is still suspected, repeat the test at appropriate intervals.   Hemoglobin A1c     Status: Abnormal   Collection Time: 01/08/16  2:41 AM  Result Value Ref Range   Hgb A1c MFr Bld 5.7 (H) 4.8 - 5.6 %    Comment: (NOTE)         Pre-diabetes: 5.7 - 6.4         Diabetes: >6.4         Glycemic control for adults with diabetes: <7.0    Mean Plasma Glucose 117 mg/dL    Comment: (NOTE) Performed At: [facility] [place], [place] [id number] Lindon Romp MD NI:[id number]   CBC WITH DIFFERENTIAL     Status: Abnormal   Collection Time: 01/08/16  2:41 AM  Result Value Ref Range   WBC 14.4 (H) 4.0 - 10.5 K/uL    Comment: WHITE COUNT CONFIRMED ON SMEAR   RBC 3.43 (L) 3.87 - 5.11 MIL/uL   Hemoglobin 10.5 (L) 12.0 - 15.0 g/dL  Comment: DELTA CHECK NOTED REPEATED TO VERIFY    HCT 31.9 (L) 36.0 - 46.0 %   MCV 93.0 78.0 - 100.0 fL   MCH 30.6 26.0 - 34.0 pg   MCHC 32.9 30.0 - 36.0 g/dL   RDW 16.4 (H) 11.5 - 15.5 %   Platelets 241  150 - 400 K/uL    Comment: PLATELET COUNT CONFIRMED BY SMEAR   Neutrophils Relative % 96 %   Neutro Abs 13.7 (H) 1.7 - 7.7 K/uL   Lymphocytes Relative 1 %   Lymphs Abs 0.1 (L) 0.7 - 4.0 K/uL   Monocytes Relative 3 %   Monocytes Absolute 0.4 0.1 - 1.0 K/uL   Eosinophils Relative 0 %   Eosinophils Absolute 0.1 0.0 - 0.7 K/uL   Basophils Relative 0 %   Basophils Absolute 0.0 0.0 - 0.1 K/uL  Basic metabolic panel     Status: Abnormal   Collection Time: 01/08/16  2:41 AM  Result Value Ref Range   Sodium 134 (L) 135 - 145 mmol/L   Potassium 3.4 (L) 3.5 - 5.1 mmol/L    Comment: SLIGHT HEMOLYSIS   Chloride 99 (L) 101 - 111 mmol/L   CO2 22 22 - 32 mmol/L   Glucose, Bld 90 65 - 99 mg/dL   BUN 54 (H) 6 - 20 mg/dL   Creatinine, Ser 2.00 (H) 0.44 - 1.00 mg/dL   Calcium 7.4 (L) 8.9 - 10.3 mg/dL   GFR calc non Af Amer 24 (L) >60 mL/min   GFR calc Af Amer 28 (L) >60 mL/min    Comment: (NOTE) The eGFR has been calculated using the CKD EPI equation. This calculation has not been validated in all clinical situations. eGFR's persistently <60 mL/min signify possible Chronic Kidney Disease.    Anion gap 13 5 - 15  MRSA PCR Screening     Status: Abnormal   Collection Time: 01/08/16  4:28 PM  Result Value Ref Range   MRSA by PCR POSITIVE (A) NEGATIVE    Comment:        The GeneXpert MRSA Assay (FDA approved for NASAL specimens only), is one component of a comprehensive MRSA colonization surveillance program. It is not intended to diagnose MRSA infection nor to guide or monitor treatment for MRSA infections. RESULT CALLED TO, READ BACK BY AND VERIFIED WITH: T.NEILSON,RN BY L.PITT AT 2158   CBC     Status: Abnormal   Collection Time: 01/09/16  2:48 AM  Result Value Ref Range   WBC 12.7 (H) 4.0 - 10.5 K/uL   RBC 3.81 (L) 3.87 - 5.11 MIL/uL   Hemoglobin 11.0 (L) 12.0 - 15.0 g/dL   HCT 35.6 (L) 36.0 - 46.0 %   MCV 93.4 78.0 - 100.0 fL   MCH 28.9 26.0 - 34.0 pg   MCHC 30.9 30.0 - 36.0  g/dL   RDW 16.1 (H) 11.5 - 15.5 %   Platelets 217 150 - 400 K/uL  Basic metabolic panel     Status: Abnormal   Collection Time: 01/09/16  2:48 AM  Result Value Ref Range   Sodium 141 135 - 145 mmol/L    Comment: DELTA CHECK NOTED   Potassium 3.6 3.5 - 5.1 mmol/L   Chloride 108 101 - 111 mmol/L   CO2 16 (L) 22 - 32 mmol/L   Glucose, Bld 88 65 - 99 mg/dL   BUN 43 (H) 6 - 20 mg/dL   Creatinine, Ser 1.76 (H) 0.44 - 1.00 mg/dL   Calcium 7.6 (L)  8.9 - 10.3 mg/dL   GFR calc non Af Amer 28 (L) >60 mL/min   GFR calc Af Amer 33 (L) >60 mL/min    Comment: (NOTE) The eGFR has been calculated using the CKD EPI equation. This calculation has not been validated in all clinical situations. eGFR's persistently <60 mL/min signify possible Chronic Kidney Disease.    Anion gap 17 (H) 5 - 15    Ct Head Wo Contrast  01/07/2016  CLINICAL DATA:  Altered mental status over the last few days. Hypoxia with fluid retention. EXAM: CT HEAD WITHOUT CONTRAST TECHNIQUE: Contiguous axial images were obtained from the base of the skull through the vertex without intravenous contrast. COMPARISON:  11/05/2015 and 09/07/2012 examinations. FINDINGS: Brain: There is no evidence of acute intracranial hemorrhage, mass lesion, brain edema or extra-axial fluid collection. The ventricles and subarachnoid spaces are prominent but stable. There is no CT evidence of acute cortical infarction. There are chronic small vessel ischemic changes in the periventricular white matter and basal ganglia. Intracranial vascular calcifications are present. A small amount of air within the cavernous sinuses and infratemporal fossa is likely incidental, typically related to the presence of a an IV line. Bones/sinuses/visualized face: Mucosal thickening in the left maxillary sinus has improved. The visualized paranasal sinuses, mastoid air cells and middle ears are otherwise clear. The calvarium is intact. IMPRESSION: No acute intracranial findings.  Stable atrophy and chronic small vessel ischemic changes. Interval improved left maxillary sinus mucosal thickening. Electronically Signed   By: Richardean Sale M.D.   On: 01/07/2016 20:25   US Renal  01/08/2016  CLINICAL DATA:  Acute renal failure. EXAM: RENAL / URINARY TRACT ULTRASOUND COMPLETE COMPARISON:  One-view abdomen 09/25/2015. FINDINGS: Right Kidney: Length: 10.1 cm, within normal limits. Normal echogenicity is present. There are 2 exophytic cysts. These both appear normal. They measure 3.5 x 3.4 x 3.4 cm at the lower pole and 1.2 x 1.2 x 1.5 cm near the upper pole. No other mass lesion or stone is present. Left Kidney: Length: 9.3 cm, within normal limits. Echogenicity within normal limits. No mass or hydronephrosis visualized. Bladder: The bladder is decompressed.  A Foley catheter is visualized. IMPRESSION: 1. Foley catheter in place. 2. Benign appearing cysts in the right kidney. 3. Normal echogenicity of the renal parenchyma without evidence for obstruction. Electronically Signed   By: San Morelle M.D.   On: 01/08/2016 12:15   Dg Chest Portable 1 View  01/07/2016  CLINICAL DATA:  Hypotensive.  Short of breath. EXAM: PORTABLE CHEST 1 VIEW COMPARISON:  10/03/2015 FINDINGS: Midline trachea. Cardiomegaly accentuated by AP portable technique. Atherosclerosis in the transverse aorta. No pleural effusion or pneumothorax. Mild left infrahilar atelectasis. Patchy right base airspace disease persists. Inferior right upper lobe aeration is improved. IMPRESSION: Persistent or recurrent right base airspace disease, which could represent infection or atelectasis. Cardiomegaly with aortic atherosclerosis.  No congestive failure. Electronically Signed   By: Abigail Miyamoto M.D.   On: 01/07/2016 17:55    Review of Systems  Unable to perform ROS: mental acuity   Blood pressure 108/61, pulse 91, temperature 97.6 F (36.4 C), temperature source Oral, resp. rate 29, height '5\' 2"'$  (1.575 m), weight 165  lb 9.1 oz (75.1 kg), SpO2 81 %. Physical Exam  Nursing note and vitals reviewed. Constitutional: She appears well-developed.  Frail   HENT:  Head: Normocephalic and atraumatic.  Mouth/Throat: No oropharyngeal exudate.  Eyes: EOM are normal.  Pupils large and not reactive to light  Neck: Normal  range of motion. Neck supple. No JVD present.  Cardiovascular: S1 normal and S2 normal.  An irregularly irregular rhythm present. Tachycardia present.   No murmur heard. Pulses:      Radial pulses are 2+ on the right side, and 2+ on the left side.       Dorsalis pedis pulses are 1+ on the right side, and 1+ on the left side.  Respiratory: Effort normal. She has no wheezes.  Decreased BS  GI: Soft. Bowel sounds are normal. She exhibits no distension. There is no tenderness.  Musculoskeletal: She exhibits no edema.  Neurological: She is alert.  Oriented x2  Skin: Skin is warm and dry.  Diffuse areas of ecchymosis arms, chest.  Psychiatric: She has a normal mood and affect.    Assessment/Plan:    Sepsis ([place]) from UTI/PNA  Treated by IM   Atrial flutter ([place]) Overall her rate is controlled on cardizem 180 daily.  CHADSVASC is 4.  Given her overall poor prognosis, I think discontinuation of Xarelto all together is reasonable.  She is covered in bruises and some look like they were actively bleeding on her arm.    RLL pneumonia   AKI (acute kidney injury) ([place])   Goals of care, counseling/discussion   Palliative care encounter   Loss of weight   Thrush of mouth and esophagus ([place])  HAGER, [place], [place] 01/09/2016, 2:18 PM   Patient seen, examined. Available data reviewed. Agree with findings, assessment, and plan as outlined by Tarri Fuller, PA-C. The patient is independently interviewed and examined. She is somnolent but arousable. She responds to simple questions appropriately. Lungs are clear. Heart is irregularly irregular without murmur or gallop. Her extremities are very tender to touch.  She has diffuse ecchymoses.  I have personally reviewed the patient's chart, including palliative care consultation and hospital records. The patient has dementia and extremely poor health. She is essentially bedbound. We are asked to see her to weigh and on whether to discontinue oral anticoagulation in the setting of chronic atrial fibrillation. The patient has extensive bruising and considering her goals of care and poor prognosis, anticoagulation should be discontinued. It is reasonable to keep her on aspirin 81 mg.  Sherren Mocha, M.D. 01/09/2016 4:12 PM

## 2016-01-09 NOTE — Evaluation (Signed)
Clinical/Bedside Swallow Evaluation Patient Details  Name: Caitlin Hampton MRN: [id number] Date of Birth: [birth year]  Today's Date: 01/09/2016 Time: SLP Start Time (ACUTE ONLY): [id number] SLP Stop Time (ACUTE ONLY): 1107 SLP Time Calculation (min) (ACUTE ONLY): 20 min  Past Medical History:  Past Medical History  Diagnosis Date  . Hypertension   . Thyroid disease   . COPD (chronic obstructive pulmonary disease) ([place])   . Anxiety   . H/O hiatal hernia   . Hypothyroidism   . Shortness of breath   . Atrial flutter ([place]) 09/07/2012    , RVR/ hospital 12/13- rate control and Echo normal- on xarelto, when spontaneously converted she had bradycardia on Metoprolol and Digoxin  . GERD (gastroesophageal reflux disease)   . Rhinitis   . Parotitis     , on CT of face- 12/13  . CHF (congestive heart failure) ([place])   . Hx of echocardiogram     Echo (8/15):  EF 60-65%  . NSTEMI (non-ST elevated myocardial infarction) ([place])   . CAP (community acquired pneumonia) 09/14/2015   Past Surgical History:  Past Surgical History  Procedure Laterality Date  . Tonsillectomy    . Breast surgery      benign R brest CA  . Cardioversion N/A 04/02/2014    Procedure: CARDIOVERSION;  Surgeon: Josue Hector, MD;  Location: [place];  Service: Cardiovascular;  Laterality: N/A;  . Cardioversion N/A 08/17/2014    Procedure: CARDIOVERSION;  Surgeon: [name], MD;  Location: [place];  Service: Cardiovascular;  Laterality: N/A;  . I&d extremity Right 09/18/2015    Procedure: IRRIGATION AND DEBRIDEMENT RIGHT LOWER LEG.;  Surgeon: Mcarthur Rossetti, MD;  Location: [place];  Service: Orthopedics;  Laterality: Right;  . Application of wound vac Right 09/18/2015    Procedure: APPLICATION OF WOUND VAC RIGHT LOWER LEG;  Surgeon: Mcarthur Rossetti, MD;  Location: [place];  Service: Orthopedics;  Laterality: Right;  . Skin split graft Right 09/23/2015    Procedure: SKIN GRAFT SPLIT THICKNESS RIGHT LEG;  Surgeon:  Mcarthur Rossetti, MD;  Location: [place];  Service: Orthopedics;  Laterality: Right;  Right Thigh   HPI:  70 y.o. woman with a history of COPD with chronic O2 dependence (3-4L [place] at baseline), HTN, chronic afib/flutter (anticoagulated with Xarelto), diastolic heart failure, CAD, and dementia who has been a resident in a skilled nursing facility since her admission here in December for HCAP and right leg ulcer.    Assessment / Plan / Recommendation Clinical Impression  Pt has no overt s/s of aspiration, although her sister does report that she eats softer textures since she has not been wearing her dentures. Mastication and oral clearance is adequate with Dys 2 textures with extra time. Will start Dys 2 diet and thin liquids with brief SLP f/u given concern for RLL PNA on CXR.`    Aspiration Risk  Mild aspiration risk    Diet Recommendation Dysphagia 2 (Fine chop);Thin liquid   Liquid Administration via: Cup;Straw Medication Administration: Whole meds with puree Supervision: Patient able to self feed;Full supervision/cueing for compensatory strategies Compensations: Slow rate;Minimize environmental distractions;Small sips/bites Postural Changes: Seated upright at 90 degrees;Remain upright for at least 30 minutes after po intake    Other  Recommendations Oral Care Recommendations: Oral care BID   Follow up Recommendations  Skilled Nursing facility    Frequency and Duration min 2x/week  1 week       Prognosis Prognosis for Safe Diet Advancement: Fair Barriers to  Reach Goals: Other (Comment) (PLOF)      Swallow Study   General HPI: 70 y.o. woman with a history of COPD with chronic O2 dependence (3-4L [place] at baseline), HTN, chronic afib/flutter (anticoagulated with Xarelto), diastolic heart failure, CAD, and dementia who has been a resident in a skilled nursing facility since her admission here in December for HCAP and right leg ulcer.  Type of Study: Bedside Swallow  Evaluation Previous Swallow Assessment: none in chart Diet Prior to this Study: NPO Temperature Spikes Noted: No Respiratory Status: Nasal cannula History of Recent Intubation: No Behavior/Cognition: Alert;Cooperative;Requires cueing Oral Cavity Assessment: Dry;Other (comment) (tongue is dry, discolored) Oral Care Completed by SLP: No Oral Cavity - Dentition: Missing dentition (sister says she no longer wears her dentures) Vision: Functional for self-feeding Self-Feeding Abilities: Able to feed self;Needs assist Patient Positioning: Upright in bed Baseline Vocal Quality: Normal    Oral/Motor/Sensory Function Overall Oral Motor/Sensory Function: Within functional limits   Ice Chips Ice chips: Not tested   Thin Liquid Thin Liquid: Within functional limits Presentation: Self Fed;Straw    Nectar Thick Nectar Thick Liquid: Not tested   Honey Thick Honey Thick Liquid: Not tested   Puree Puree: Within functional limits Presentation: Spoon   Solid   GO   Solid: Within functional limits (dys 2 textures)       Germain Osgood, M.A. CCC-SLP [phone]  Germain Osgood 01/09/2016,1:15 PM

## 2016-01-09 NOTE — Progress Notes (Signed)
CRITICAL VALUE ALERT  Critical value received:  Blood cultures positive gram positive cocci in clusters in aerobic bottle  Date of notification: 01/09/2016   Time of notification:  0525  Critical value read back: yes  Nurse who received alert:  Reatha Armour RN  MD notified (1st page):  Chaney Malling  Time of first page:  6:56 AM   MD notified (2nd page):  Time of second page:  Responding MD:    Time MD responded:   No response at this time, will pass along in report

## 2016-01-10 ENCOUNTER — Inpatient Hospital Stay (HOSPITAL_COMMUNITY): Payer: Medicare Other

## 2016-01-10 DIAGNOSIS — K5900 Constipation, unspecified: Secondary | ICD-10-CM | POA: Insufficient documentation

## 2016-01-10 DIAGNOSIS — B370 Candidal stomatitis: Secondary | ICD-10-CM | POA: Insufficient documentation

## 2016-01-10 DIAGNOSIS — I484 Atypical atrial flutter: Secondary | ICD-10-CM

## 2016-01-10 DIAGNOSIS — Z515 Encounter for palliative care: Secondary | ICD-10-CM | POA: Insufficient documentation

## 2016-01-10 LAB — BASIC METABOLIC PANEL
Anion gap: 14 (ref 5–15)
BUN: 38 mg/dL — ABNORMAL HIGH (ref 6–20)
CALCIUM: 7.9 mg/dL — AB (ref 8.9–10.3)
CHLORIDE: 109 mmol/L (ref 101–111)
CO2: 19 mmol/L — ABNORMAL LOW (ref 22–32)
CREATININE: 1.5 mg/dL — AB (ref 0.44–1.00)
GFR, EST AFRICAN AMERICAN: 40 mL/min — AB (ref >60)
GFR, EST NON AFRICAN AMERICAN: 34 mL/min — AB (ref >60)
Glucose, Bld: 112 mg/dL — ABNORMAL HIGH (ref 65–99)
Potassium: 2.7 mmol/L — CL (ref 3.5–5.1)
SODIUM: 142 mmol/L (ref 135–145)

## 2016-01-10 LAB — T4, FREE: FREE T4: 0.65 ng/dL (ref 0.61–1.12)

## 2016-01-10 LAB — CBC
HCT: 32.7 % — ABNORMAL LOW (ref 36.0–46.0)
HEMOGLOBIN: 10.1 g/dL — AB (ref 12.0–15.0)
MCH: 28.7 pg (ref 26.0–34.0)
MCHC: 30.9 g/dL (ref 30.0–36.0)
MCV: 92.9 fL (ref 78.0–100.0)
Platelets: 198 10*3/uL (ref 150–400)
RBC: 3.52 MIL/uL — AB (ref 3.87–5.11)
RDW: 16.1 % — ABNORMAL HIGH (ref 11.5–15.5)
WBC: 11 10*3/uL — ABNORMAL HIGH (ref 4.0–10.5)

## 2016-01-10 LAB — TSH: TSH: 4.095 u[IU]/mL (ref 0.350–4.500)

## 2016-01-10 LAB — URINE CULTURE

## 2016-01-10 MED ORDER — POTASSIUM CHLORIDE 10 MEQ/100ML IV SOLN
10.0000 meq | INTRAVENOUS | Status: AC
Start: 2016-01-10 — End: 2016-01-10
  Administered 2016-01-10 (×4): 10 meq via INTRAVENOUS
  Filled 2016-01-10 (×5): qty 100

## 2016-01-10 MED ORDER — POTASSIUM CHLORIDE CRYS ER 20 MEQ PO TBCR
40.0000 meq | EXTENDED_RELEASE_TABLET | ORAL | Status: AC
  Administered 2016-01-10 (×2): 40 meq via ORAL
  Filled 2016-01-10 (×2): qty 2

## 2016-01-10 MED ORDER — MAGNESIUM SULFATE IN D5W 10-5 MG/ML-% IV SOLN
1.0000 g | Freq: Once | INTRAVENOUS | Status: AC
  Administered 2016-01-10: 1 g via INTRAVENOUS
  Filled 2016-01-10: qty 100

## 2016-01-10 MED ORDER — DILTIAZEM HCL ER COATED BEADS 240 MG PO CP24
240.0000 mg | ORAL_CAPSULE | Freq: Every day | ORAL | Status: DC
  Administered 2016-01-11 – 2016-01-12 (×2): 240 mg via ORAL
  Filled 2016-01-10 (×2): qty 1

## 2016-01-10 MED ORDER — DOCUSATE SODIUM 100 MG PO CAPS
100.0000 mg | ORAL_CAPSULE | Freq: Two times a day (BID) | ORAL | Status: DC
  Administered 2016-01-10 – 2016-01-12 (×4): 100 mg via ORAL
  Filled 2016-01-10 (×4): qty 1

## 2016-01-10 MED ORDER — GLYCERIN (LAXATIVE) 2.1 G RE SUPP
1.0000 | Freq: Once | RECTAL | Status: DC
  Filled 2016-01-10: qty 1

## 2016-01-10 MED ORDER — VANCOMYCIN HCL IN DEXTROSE 750-5 MG/150ML-% IV SOLN
750.0000 mg | INTRAVENOUS | Status: DC
  Administered 2016-01-10: 750 mg via INTRAVENOUS
  Filled 2016-01-10 (×2): qty 150

## 2016-01-10 MED ORDER — DILTIAZEM HCL 60 MG PO TABS
60.0000 mg | ORAL_TABLET | Freq: Three times a day (TID) | ORAL | Status: AC
  Administered 2016-01-10 (×2): 60 mg via ORAL
  Filled 2016-01-10 (×3): qty 1

## 2016-01-10 MED ORDER — ASPIRIN 81 MG PO CHEW
81.0000 mg | CHEWABLE_TABLET | Freq: Every day | ORAL | Status: DC
  Administered 2016-01-10 – 2016-01-12 (×3): 81 mg via ORAL
  Filled 2016-01-10 (×4): qty 1

## 2016-01-10 NOTE — Progress Notes (Signed)
Pharmacy Antibiotic Note  Caitlin Hampton is a 70 y.o. female admitted on 01/07/2016 with bacteremia and UTI.  Pharmacy has been consulted for Vancomycin and Zosyn dosing.  Plan: 70 yo female on Vanc and Zosyn; Vancomycin stopped this AM.  Blood cx are (+)Coag neg staph with sensitivities pending.  Notified MD and to continue Vancomycin.  Cr has been improving.  1- Vancomycin 750mg  IV q24, next dose at 6PM 2- Continue Zosyn 3.375g IV q8, infuse over 4h 3-  F/U blood cx sensitivities & ability to narrow abx regimen 4-  Watch renal fxn, clinical status 5-  Vanc Tr if to continue   Height: 5\' 2"  (157.5 cm) Weight: 165 lb 9.1 oz (75.1 kg) IBW/kg (Calculated) : 50.1  Temp (24hrs), Avg:97.8 F (36.6 C), Min:97.4 F (36.3 C), Max:98.2 F (36.8 C)   Recent Labs Lab 01/07/16 1712 01/07/16 1718 01/07/16 1722 01/07/16 1949 01/08/16 0241 01/09/16 0248 01/10/16 0440  WBC 19.6*  --   --   --  14.4* 12.7* 11.0*  CREATININE 2.38* 2.40*  --   --  2.00* 1.76* 1.50*  LATICACIDVEN  --   --  2.17* 1.05  --   --   --     Estimated Creatinine Clearance: 33.1 mL/min (by C-G formula based on Cr of 1.5).    No Known Allergies  Antimicrobials this admission: Vancomycin 4/16 >>  Zosyn 4/16 >>   Microbiology results: 4/16 BCx: (+)Coag neg staph in both cx, sens pending 4/16 UCx: Pseudomonas aeroginosa, pan-sens   Thank you for allowing pharmacy to be a part of this patient's care.   Gracy Bruins, PharmD Clinical Pharmacist [facility]

## 2016-01-10 NOTE — Clinical Social Work Note (Signed)
Clinical Social Work Assessment  Patient Details  Name: Caitlin Hampton MRN: [id number] Date of Birth: 1945/10/13  Date of referral:  01/10/16               Reason for consult:  Facility Placement                Permission sought to share information with:  Facility Sport and exercise psychologist, Family Supports Permission granted to share information::  Yes, Verbal Permission Granted  Name::     Caitlin Hampton  Agency::  [facility]  Relationship::  sister  Contact Information:     Housing/Transportation Living arrangements for the past 2 months:  [place] of Information:   (sibling) Patient Interpreter Needed:  None Criminal Activity/Legal Involvement Pertinent to Current Situation/Hospitalization:    Significant Relationships:  None Lives with:    Do you feel safe going back to the place where you live?  Yes Need for family participation in patient care:  Yes (Comment) (decision making)  Care giving concerns: Pt normally lives with sister but has been at [facility] getting rehab.   Social Worker assessment / plan:  CSW spoke with pt sister concerning plan for pt when stable for DC.  Sister states that pt will need continued SNF stay when she leaves the hospital but is hopeful that pt will improve enough to return home with sister.  Employment status:  Retired Nurse, adult PT Recommendations:  [place] / Referral to community resources:  [place]  Patient/Family's Response to care:  Pt sister is agreeable to plan for return to [place].  Patient/Family's Understanding of and Emotional Response to Diagnosis, Current Treatment, and Prognosis:  No questions or concerns at this time- sister is hopeful that pt will improve enough to go home in the near future.  Emotional Assessment Appearance:  Appears stated age Attitude/Demeanor/Rapport:  Unable to Assess Affect (typically observed):  Unable to  Assess Orientation:  Oriented to Self, Oriented to Place Alcohol / Substance use:  Not Applicable Psych involvement (Current and /or in the community):  No (Comment)  Discharge Needs  Concerns to be addressed:  Care Coordination Readmission within the last 30 days:  No Current discharge risk:  Physical Impairment Barriers to Discharge:  Continued Medical Work up   [employer], LCSW 01/10/2016, 1:24 PM

## 2016-01-10 NOTE — Progress Notes (Signed)
RT instructed patient on use of flutter valve and incentive spirometer. Patient was able to demonstrate back good technique on both.

## 2016-01-10 NOTE — Progress Notes (Signed)
Speech Language Pathology Treatment: Dysphagia  Patient Details Name: Caitlin Hampton MRN: [id number] DOB: 1946-06-22 Today's Date: 01/10/2016 Time: [id number] SLP Time Calculation (min) (ACUTE ONLY): 26 min  Assessment / Plan / Recommendation Clinical Impression  F/u after yesterday's swallow evaluation.  Pt with baseline cough.  CXR 4/19: bibasilar airspace opacification, worsening on the left, possibly due to atelectasis and/or pneumonia.  Lungs with rhonchi.  Palliative Medicine involved with care.  Pt with fluctuating toleration of POs - some difficulty masticating solids with retention of meats right oral cavity after lunch.  Occasional cough noted during lunch; may be attributable to aspiration, particularly given worsening CXR. Required min cues overall for safety.  For now, recommend downgrading diet to dysphagia 1, thin liquids.  Please provide assistance with self-feeding - pt was too weak to feed herself independently.  SLP will follow for safety, education, and to determine whether comfort POs are desired by pt's sister.     HPI HPI: 70 y.o. woman with a history of COPD with chronic O2 dependence (3-4L  at baseline), HTN, chronic afib/flutter (anticoagulated with Xarelto), diastolic heart failure, CAD, and dementia who has been a resident in a skilled nursing facility since her admission here in December for HCAP and right leg ulcer. Being followed by Palliative Medicine.        SLP Plan  Continue with current plan of care     Recommendations  Medication Administration: Whole meds with puree Supervision: Full supervision/cueing for compensatory strategies;Staff to assist with self feeding Compensations: Slow rate;Minimize environmental distractions;Small sips/bites Postural Changes and/or Swallow Maneuvers: Seated upright 90 degrees             Oral Care Recommendations: Oral care BID Follow up Recommendations: Skilled Nursing facility Plan: Continue with current plan of  care     Caitlin Lentsch L. Tivis Ringer, [place] CCC/SLP Pager [phone]                 Caitlin Hampton 01/10/2016, 3:31 PM

## 2016-01-10 NOTE — Progress Notes (Signed)
Daily Progress Note   Patient Name: Caitlin Hampton       Date: 01/10/2016 DOB: 12/29/1945  Age: 70 y.o. MRN#: [id number] Attending Physician: Thurnell Lose, MD Primary Care Physician: Gildardo Cranker, DO Admit Date: 01/07/2016  Reason for Consultation/Follow-up: Establishing goals of care  Subjective: Complains of pain from the BP cuff on her leg.  Interval Events: BP  elevated 180 - 230s?Marland Kitchen  Potassium low (2.7).  Cxr shows worsening opacification on the left.   Otherwise patient appears stable and comfortable.  Per speech therapy eval, patient has mild risk of aspiration.  Length of Stay: 3 days  Current Medications: Scheduled Meds:  . antiseptic oral rinse  7 mL Mouth Rinse q12n4p  . arformoterol  15 mcg Nebulization BID  . atorvastatin  10 mg Oral Daily  . budesonide  0.25 mg Nebulization BID  . chlorhexidine  15 mL Mouth Rinse BID  . Chlorhexidine Gluconate Cloth  6 each Topical Q0600  . diltiazem  180 mg Oral Daily  . ipratropium  0.5 mg Nebulization QID  . levothyroxine  50 mcg Intravenous Daily  . magnesium sulfate 1 - 4 g bolus IVPB  1 g Intravenous Once  . mupirocin ointment  1 application Nasal BID  . nystatin  5 mL Oral QID  . pantoprazole (PROTONIX) IV  40 mg Intravenous Q24H  . piperacillin-tazobactam  3.375 g Intravenous Once  . piperacillin-tazobactam (ZOSYN)  IV  3.375 g Intravenous 3 times per day  . potassium chloride  10 mEq Intravenous Q1 Hr x 4  . potassium chloride  40 mEq Oral Q4H  . rivaroxaban  15 mg Oral Daily  . vancomycin  750 mg Intravenous Q24H    Continuous Infusions:    PRN Meds: albuterol, diltiazem   Physical Exam              Obese, chronically ill appearing female, NAD, comfortable. speech is clear. CV S1, S2, irregular, no frank  m/r/g Respirations: No increased work of breathing. Extremities: 1+ edema.  Abdomen:  Soft, tender to palpation, umbilical hernia - tender to palpation. Skin:  Dry.  Extensive bruising on extremities.  Some on chest as well.    Vital Signs: BP 142/88 mmHg  Pulse 50  Temp(Src) 97.9 F (36.6 C) (Oral)  Resp 22  Ht 5'  2" (1.575 m)  Wt 75.1 kg (165 lb 9.1 oz)  BMI 30.27 kg/m2  SpO2 99% SpO2: SpO2: 99 % O2 Device: O2 Device: Nasal Cannula O2 Flow Rate: O2 Flow Rate (L/min): 5 L/min  Intake/output summary:  Intake/Output Summary (Last 24 hours) at 01/10/16 0813 Last data filed at 01/10/16 0706  Gross per 24 hour  Intake 1263.33 ml  Output    450 ml  Net 813.33 ml   LBM: Last BM Date: 01/08/16 Baseline Weight: Weight: 75.1 kg (165 lb 9.1 oz) Most recent weight: Weight: 75.1 kg (165 lb 9.1 oz)       Palliative Assessment/Data: Flowsheet Rows        Most Recent Value   Intake Tab    Referral Department  Hospitalist   Unit at Time of Referral  Intermediate Care Unit   Palliative Care Primary Diagnosis  Neurology   Date Notified  01/08/16   Palliative Care Type  New Palliative care   Reason for referral  Clarify Goals of Care   Date of Admission  01/08/16   Date first seen by Palliative Care  01/09/16   # of days Palliative referral response time  1 Day(s)   # of days IP prior to Palliative referral  0   Clinical Assessment    Palliative Performance Scale Score  30%   Psychosocial & Spiritual Assessment    Palliative Care Outcomes       Additional Data Reviewed: CBC    Component Value Date/Time   WBC 11.0* 01/10/2016 0440   WBC 10.1 11/17/2015   RBC 3.52* 01/10/2016 0440   RBC 3.00* 09/19/2015 1050   HGB 10.1* 01/10/2016 0440   HCT 32.7* 01/10/2016 0440   PLT 198 01/10/2016 0440   MCV 92.9 01/10/2016 0440   MCH 28.7 01/10/2016 0440   MCHC 30.9 01/10/2016 0440   RDW 16.1* 01/10/2016 0440   LYMPHSABS 0.1* 01/08/2016 0241   MONOABS 0.4 01/08/2016 0241    EOSABS 0.1 01/08/2016 0241   BASOSABS 0.0 01/08/2016 0241    CMP     Component Value Date/Time   NA 142 01/10/2016 0440   NA 140 10/06/2015   K 2.7* 01/10/2016 0440   CL 109 01/10/2016 0440   CO2 19* 01/10/2016 0440   GLUCOSE 112* 01/10/2016 0440   BUN 38* 01/10/2016 0440   BUN 36* 10/06/2015   CREATININE 1.50* 01/10/2016 0440   CREATININE 1.0 10/06/2015   CALCIUM 7.9* 01/10/2016 0440   PROT 5.8* 01/07/2016 1712   ALBUMIN 2.4* 01/07/2016 1712   AST 68* 01/07/2016 1712   ALT 41 01/07/2016 1712   ALKPHOS 97 01/07/2016 1712   BILITOT 1.2 01/07/2016 1712   GFRNONAA 34* 01/10/2016 0440   GFRAA 40* 01/10/2016 0440       Problem List:  Patient Active Problem List   Diagnosis Date Noted  . Goals of care, counseling/discussion   . Palliative care encounter   . Loss of weight   . Thrush of mouth and esophagus ([place])   . RLL pneumonia 01/07/2016  . AKI (acute kidney injury) ([place]) 01/07/2016  . Pressure ulcer 09/17/2015  . Hematoma of right lower extremity 09/14/2015  . CAP (community acquired pneumonia) 09/14/2015  . UTI (urinary tract infection) 09/12/2015  . COPD exacerbation ([place]) 09/11/2015  . Sepsis ([place]) 09/11/2015  . Acute and chronic respiratory failure with hypoxia ([place]) 09/11/2015  . Chronic a-fib ([place]) 09/11/2015  . Chronic diastolic heart failure ([place]) 02/06/2015  . Morbid obesity ([place]) 02/06/2015  .  Other emphysema ([place]) 02/06/2015  . Acute on chronic diastolic CHF (congestive heart failure) ([place]) 12/12/2014  . Acute on chronic renal failure ([place]) 12/12/2014  . Chronic respiratory failure ([place]) 12/12/2014  . Cellulitis and abscess of leg 12/12/2014  . HX: anticoagulation 12/12/2014  . Elevated troponin 12/12/2014  . Chronic systolic CHF (congestive heart failure) ([place]) 12/11/2014  . Acute on chronic respiratory failure ([place]) 12/11/2014  . GI bleed 12/11/2014  . Anemia, iron deficiency   . Cellulitis of right lower extremity   . Other specified  hypotension   . Leukocytosis   . DVT (deep venous thrombosis) ([place])   . Acute renal failure (ARF) ([place]) 04/03/2014  . Hypertension   . COPD (chronic obstructive pulmonary disease) ([place])   . Hypothyroidism   . Shortness of breath   . CHF (congestive heart failure) ([place])   . Respiratory failure requiring O2, inhalers 09/09/2012  . COPD with exacerbation ([place]) 09/09/2012  . Acute diastolic CHF (congestive heart failure) ([place]) 09/09/2012  . Obese 09/08/2012  . Atrial flutter with rapid ventricular response, unknown duration 09/07/2012  . HTN (hypertension), LVH with NL LVF 09/07/2012  . 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 Parotiditis, with lt facial swelling, on ABs 09/07/2012  . Atrial flutter ([place]) 09/07/2012     Palliative Care Assessment & Plan    1.Code Status:  DNR   2. Goals of Care/Additional Recommendations:  D/C anticoagulation. Start Asa 81 mg. Family accepts increased risk of blood clots/stroke/PE  Continue antibiotics and IVF if indicated.  Engaged with Palliative Care ([facility]) as an outpatient.   Family values Quality over Quantity of life for Ms. Osuna.  Continue nystatin swish and swallow for approx 10 days.  With on-going good oral care.  Will add a glycerin suppository and colace    4. Palliative Prophylaxis:   Aspiration and Bowel Regimen  5. Prognosis: difficult to determine but given sedentary status, recurrent hospitalizations, discontinuation of coumadin, dementia and 70 lbs weight loss in 4 months - - likely less than 6 months.  6. Discharge Planning:  Return to SNF with Palliative Medicine follow up.  Will likely be an excellent candidate for Hospice referral in the next several months.   Care plan was discussed with [facility] MD.  Thank you for allowing the Palliative Medicine Team to assist in the care of this patient.   Time In: 8:00 Time Out: 8:30 Total Time 30  Prolonged Time Billed no         Melton Alar, PA-C  01/10/2016, 8:13 AM  Please contact Palliative Medicine Team phone at [phone] for questions and concerns.

## 2016-01-10 NOTE — NC FL2 (Signed)
[place] LEVEL OF CARE SCREENING TOOL     IDENTIFICATION  Patient Name: Caitlin Hampton Birthdate: 03-Jan-1946 Sex: female Admission Date (Current Location): 01/07/2016  [facility] and [place] Number:  [employer] and Address:  [facility] [place]. [facility], [place] [address], [place], [place] [address]      Provider Number: [id number]  Attending Physician Name and Address:  Thurnell Lose, MD  Relative Name and Phone Number:       Current Level of Care: Hospital Recommended Level of Care: [place] Prior Approval Number:    Date Approved/Denied:   PASRR Number:    Discharge Plan: SNF    Current Diagnoses: Patient Active Problem List   Diagnosis Date Noted  . Infrequent bowel movements   . Encounter for palliative care   . Thrush   . Goals of care, counseling/discussion   . Palliative care encounter   . Loss of weight   . Thrush of mouth and esophagus ([place])   . RLL pneumonia 01/07/2016  . AKI (acute kidney injury) ([place]) 01/07/2016  . Pressure ulcer 09/17/2015  . Hematoma of right lower extremity 09/14/2015  . CAP (community acquired pneumonia) 09/14/2015  . UTI (urinary tract infection) 09/12/2015  . COPD exacerbation ([place]) 09/11/2015  . Sepsis ([place]) 09/11/2015  . Acute and chronic respiratory failure with hypoxia ([place]) 09/11/2015  . Chronic a-fib ([place]) 09/11/2015  . Chronic diastolic heart failure ([place]) 02/06/2015  . Morbid obesity ([place]) 02/06/2015  . Other emphysema ([place]) 02/06/2015  . Acute on chronic diastolic CHF (congestive heart failure) ([place]) 12/12/2014  . Acute on chronic renal failure ([place]) 12/12/2014  . Chronic respiratory failure ([place]) 12/12/2014  . Cellulitis and abscess of leg 12/12/2014  . HX: anticoagulation 12/12/2014  . Elevated troponin 12/12/2014  . Chronic systolic CHF (congestive heart failure) ([place]) 12/11/2014  . Acute on chronic respiratory failure ([place]) 12/11/2014  . GI bleed 12/11/2014  .  Anemia, iron deficiency   . Cellulitis of right lower extremity   . Other specified hypotension   . Leukocytosis   . DVT (deep venous thrombosis) ([place])   . Acute renal failure (ARF) ([place]) 04/03/2014  . Hypertension   . COPD (chronic obstructive pulmonary disease) ([place])   . Hypothyroidism   . Shortness of breath   . CHF (congestive heart failure) ([place])   . Respiratory failure requiring O2, inhalers 09/09/2012  . COPD with exacerbation ([place]) 09/09/2012  . Acute diastolic CHF (congestive heart failure) ([place]) 09/09/2012  . Obese 09/08/2012  . Atrial flutter with rapid ventricular response, unknown duration 09/07/2012  . HTN (hypertension), LVH with NL LVF 09/07/2012  . 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 Parotiditis, with lt facial swelling, on ABs 09/07/2012  . Atrial flutter ([place]) 09/07/2012    Orientation RESPIRATION BLADDER Height & Weight     Self, Time  O2 ([place] 4L) Indwelling catheter Weight: 165 lb 9.1 oz (75.1 kg) Height:  5\' 2"  (157.5 cm)  BEHAVIORAL SYMPTOMS/MOOD NEUROLOGICAL BOWEL NUTRITION STATUS      Continent Diet  AMBULATORY STATUS COMMUNICATION OF NEEDS Skin   Extensive Assist Verbally  (bilater skin tears)                       Personal Care Assistance Level of Assistance  Bathing, Dressing Bathing Assistance: Maximum assistance   Dressing Assistance: Maximum assistance     Functional Limitations Info  SPECIAL CARE FACTORS FREQUENCY  PT (By licensed PT), OT (By licensed OT)     PT Frequency: 5/wk OT Frequency: 5/wk            Contractures      Additional Factors Info  Code Status, Allergies, Isolation Precautions Code Status Info: DNR Allergies Info: NKA     Isolation Precautions Info: MRSA     Current Medications (01/10/2016):  This is the current hospital active medication list Current Facility-Administered Medications  Medication Dose Route Frequency Provider Last Rate  Last Dose  . albuterol (PROVENTIL) (2.5 MG/3ML) 0.083% nebulizer solution 2.5 mg  2.5 mg Nebulization Q6H PRN Lily Kocher, MD   2.5 mg at 01/08/16 1925  . antiseptic oral rinse (CPC / CETYLPYRIDINIUM CHLORIDE 0.05%) solution 7 mL  7 mL Mouth Rinse q12n4p Lily Kocher, MD   7 mL at 01/09/16 1531  . arformoterol (BROVANA) nebulizer solution 15 mcg  15 mcg Nebulization BID Lily Kocher, MD   15 mcg at 01/10/16 [phone]  . atorvastatin (LIPITOR) tablet 10 mg  10 mg Oral Daily Lily Kocher, MD   10 mg at 01/10/16 1126  . budesonide (PULMICORT) nebulizer solution 0.25 mg  0.25 mg Nebulization BID Lily Kocher, MD   0.25 mg at 01/10/16 0942  . chlorhexidine (PERIDEX) 0.12 % solution 15 mL  15 mL Mouth Rinse BID Lily Kocher, MD   15 mL at 01/10/16 1126  . Chlorhexidine Gluconate Cloth 2 % PADS 6 each  6 each Topical Q0600 Lily Kocher, MD   6 each at 01/10/16 0600  . diltiazem (CARDIZEM CD) 24 hr capsule 180 mg  180 mg Oral Daily Lily Kocher, MD   180 mg at 01/10/16 1125  . diltiazem (CARDIZEM) injection 10 mg  10 mg Intravenous Q6H PRN Thurnell Lose, MD      . docusate sodium (COLACE) capsule 100 mg  100 mg Oral BID Melton Alar, PA-C   100 mg at 01/10/16 1127  . Glycerin (Adult) 2.1 g suppository 1 suppository  1 suppository Rectal Once Melton Alar, PA-C      . ipratropium (ATROVENT) nebulizer solution 0.5 mg  0.5 mg Nebulization QID Lily Kocher, MD   0.5 mg at 01/10/16 0943  . levothyroxine (SYNTHROID, LEVOTHROID) injection 50 mcg  50 mcg Intravenous Daily Lily Kocher, MD   50 mcg at 01/10/16 1126  . magnesium sulfate IVPB 1 g 100 mL  1 g Intravenous Once Thurnell Lose, MD      . mupirocin ointment (BACTROBAN) 2 % 1 application  1 application Nasal BID Lily Kocher, MD   1 application at [id number]  . nystatin (MYCOSTATIN) 100000 UNIT/ML suspension 500,000 Units  5 mL Oral QID Melton Alar, PA-C   500,000 Units at 01/10/16 1132  . pantoprazole (PROTONIX) injection 40 mg  40 mg  Intravenous Q24H Lily Kocher, MD   40 mg at 01/09/16 2105  . piperacillin-tazobactam (ZOSYN) IVPB 3.375 g  3.375 g Intravenous Once Harvel Quale, [place] at 01/07/16 1947  . piperacillin-tazobactam (ZOSYN) IVPB 3.375 g  3.375 g Intravenous 3 times per day Harvel Quale, RPH 12.5 mL/hr at 01/10/16 0842 3.375 g at 01/10/16 0842  . Rivaroxaban (XARELTO) tablet 15 mg  15 mg Oral Daily Valeda Malm Rumbarger, RPH   15 mg at 01/10/16 1128  . vancomycin (VANCOCIN) IVPB 750 mg/150 ml premix  750 mg Intravenous Q24H Jaquita Folds, RPH  Discharge Medications: Please see discharge summary for a list of discharge medications.  Relevant Imaging Results:  Relevant Lab Results:   Additional Information SSN: [id number]  Cranford Mon, [place]

## 2016-01-10 NOTE — Progress Notes (Addendum)
[place] PROGRESS NOTE                                                                                                                                                                                                             Patient Demographics:    Caitlin Hampton, is a 70 y.o. female, DOB - May 11, 1946, [id number]  Admit date - 01/07/2016   Admitting Physician Caitlin Kocher, MD  Outpatient Primary MD for the patient is Caitlin Cranker, DO  LOS - 3  Outpatient Specialists:   Chief Complaint  Patient presents with  . Shortness of Breath       Brief Narrative      Subjective:    Caitlin Hampton today has, No headache, No chest pain, No abdominal pain - No Nausea, No new weakness tingling or numbness, No Cough - SOB. Unreliable historian   Assessment  & Plan :     1.Sepsis due to UTI and likely aspiration pneumonia With 2 out of 2 coag-negative staph blood culture positive- appears nontoxic, improved after IVF & empiric IV antibiotics which were vancomycin and Zosyn, clinically better we will leave her same antibiotics at this time, Speech following currently on dysphagia 2 diet, clinically better, added incentive spirometer and flutter valve. Clinically improved. Urine cultures noted on appropriate antibiotics. Pulmonary status improving as well. Redraw surveillance cultures on 01/10/2016.  Her overall quality of life appears very poor, she is severely deconditioned, most likely she is bedbound, has multiple bruises all over. Poor long-term prognosis, palliative care following, she is now DO NOT RESUSCITATE with goal of gentle medical treatment directed towards comfort, discharged to SNF with palliative care follow-up in 1-2 days.  2. ARF. Likely due to sepsis in the setting of patient being on Lasix and potassium. Improving with hydration, renal ultrasound stable.  3. History of Chronic A. fib/flutter. [place] vasc 2 score of greater than 4.  Due severe diffuse brusing and poor prognosis Caitlin Hampton stopped after DW family and patient. Continue Cardizem for rate control orally, will increase the dose,  along with as needed IV. She follows with Dr. Marlou Hampton for her cardiology issues. Cards following.  4. History of dementia, generalized deconditioning, poor functional status. Continue home medications, supportive care, at risk for delirium. Minimize benzodiazepines and narcotics.  5. Hypothyroidism. Continue home dose Synthroid.  6. COPD. At baseline. Uses 4 L nasal cannula oxygen, continue oxygen supplementation along with nebulizer  treatments.  7. Dyslipidemia. On statin continue.  8. GERD. On PPI.  9. History of CAD. Stable. Continue medications for secondary prevention which include statin, no aspirin as she is on xaralto. Chest pain-free.  10. Hypokalemia. Replaced will recheck.  11. Black Tongue - better hygiene, trial of nystatin.   Code Status : DNR, prognosis appears poor, DW sister 01-08-16  Family Communication  : Discussed with patient's sister in detail on 01/08/2016  Disposition Plan  : SNF 1-2 days with palliative care follow-up  Barriers For Discharge : UTI/sepsis  Consults  :  Pall Care  Procedures  :   CT head nonacute  DVT Prophylaxis  :  SCDs  Lab Results  Component Value Date   PLT 198 01/10/2016    Antibiotics  :    Anti-infectives    Start     Dose/Rate Route Frequency Ordered Stop   01/08/16 1800  vancomycin (VANCOCIN) IVPB 750 mg/150 ml premix     750 mg 150 mL/hr over 60 Minutes Intravenous Every 24 hours 01/07/16 1804     01/08/16 0000  piperacillin-tazobactam (ZOSYN) IVPB 3.375 g     3.375 g 12.5 mL/hr over 240 Minutes Intravenous 3 times per day 01/07/16 1804     01/07/16 1830  vancomycin (VANCOCIN) 1,500 mg in sodium chloride 0.9 % 500 mL IVPB     1,500 mg 250 mL/hr over 120 Minutes Intravenous  Once 01/07/16 1755 01/07/16 2143   01/07/16 1800  piperacillin-tazobactam (ZOSYN) IVPB  3.375 g     3.375 g 100 mL/hr over 30 Minutes Intravenous  Once 01/07/16 1755          Objective:   Filed Vitals:   01/10/16 0000 01/10/16 0342 01/10/16 0508 01/10/16 0857  BP: 103/75 148/100 142/88   Pulse:   50 63  Temp: 98 F (36.7 C) 97.9 F (36.6 C)  97.4 F (36.3 C)  TempSrc: Oral Oral  Oral  Resp: 18 27 22 20   Height:      Weight:      SpO2: 100% 100% 99%     Wt Readings from Last 3 Encounters:  01/08/16 75.1 kg (165 lb 9.1 oz)  12/21/15 77.735 kg (171 lb 6 oz)  11/15/15 82.464 kg (181 lb 12.8 oz)     Intake/Output Summary (Last 24 hours) at 01/10/16 0911 Last data filed at 01/10/16 0706  Gross per 24 hour  Intake 1263.33 ml  Output    450 ml  Net 813.33 ml     Physical Exam  Awake , able answer basic questions and follow basic commands, No new F.N deficits, Normal affect Caitlin Hampton,PERRAL, Black tongue Supple Neck,No JVD, No cervical lymphadenopathy appriciated.  Symmetrical Chest wall movement, Good air movement bilaterally, CTAB RRR,No Gallops,Rubs or new Murmurs, No Parasternal Heave +ve B.Sounds, Abd Soft, No tenderness, No organomegaly appriciated, No rebound - guarding or rigidity. No Cyanosis, Clubbing or edema, No new Rash , Multiple diffuse bruises, chronic contractures       Data Review:    CBC  Recent Labs Lab 01/07/16 1712 01/07/16 1718 01/08/16 0241 01/09/16 0248 01/10/16 0440  WBC 19.6*  --  14.4* 12.7* 11.0*  HGB 12.7 15.6* 10.5* 11.0* 10.1*  HCT 39.9 46.0 31.9* 35.6* 32.7*  PLT 283  --  241 217 198  MCV 92.4  --  93.0 93.4 92.9  MCH 29.4  --  30.6 28.9 28.7  MCHC 31.8  --  32.9 30.9 30.9  RDW 15.8*  --  16.4* 16.1*  16.1*  LYMPHSABS 1.0  --  0.1*  --   --   MONOABS 0.6  --  0.4  --   --   EOSABS 0.0  --  0.1  --   --   BASOSABS 0.0  --  0.0  --   --     Chemistries   Recent Labs Lab 01/07/16 1712 01/07/16 1718 01/08/16 0241 01/09/16 0248 01/10/16 0440  NA 132* 131* 134* 141 142  K 4.0 3.8 3.4* 3.6 2.7*  CL 91*  92* 99* 108 109  CO2 25  --  22 16* 19*  GLUCOSE 107* 101* 90 88 112*  BUN 55* 51* 54* 43* 38*  CREATININE 2.38* 2.40* 2.00* 1.76* 1.50*  CALCIUM 8.4*  --  7.4* 7.6* 7.9*  AST 68*  --   --   --   --   ALT 41  --   --   --   --   ALKPHOS 97  --   --   --   --   BILITOT 1.2  --   --   --   --    ------------------------------------------------------------------------------------------------------------------ No results for input(s): CHOL, HDL, LDLCALC, TRIG, CHOLHDL, LDLDIRECT in the last 72 hours.  Lab Results  Component Value Date   HGBA1C 5.7* 01/08/2016   ------------------------------------------------------------------------------------------------------------------  Recent Labs  01/10/16 0440  TSH 4.095   ------------------------------------------------------------------------------------------------------------------ No results for input(s): VITAMINB12, FOLATE, FERRITIN, TIBC, IRON, RETICCTPCT in the last 72 hours.  Coagulation profile  Recent Labs Lab 01/07/16 1712  INR 2.53*    No results for input(s): DDIMER in the last 72 hours.  Cardiac Enzymes No results for input(s): CKMB, TROPONINI, MYOGLOBIN in the last 168 hours.  Invalid input(s): CK ------------------------------------------------------------------------------------------------------------------    Component Value Date/Time   BNP 309.1* 01/07/2016 1712    Inpatient Medications  Scheduled Meds: . antiseptic oral rinse  7 mL Mouth Rinse q12n4p  . arformoterol  15 mcg Nebulization BID  . atorvastatin  10 mg Oral Daily  . budesonide  0.25 mg Nebulization BID  . chlorhexidine  15 mL Mouth Rinse BID  . Chlorhexidine Gluconate Cloth  6 each Topical Q0600  . diltiazem  180 mg Oral Daily  . ipratropium  0.5 mg Nebulization QID  . levothyroxine  50 mcg Intravenous Daily  . magnesium sulfate 1 - 4 g bolus IVPB  1 g Intravenous Once  . mupirocin ointment  1 application Nasal BID  . nystatin  5 mL  Oral QID  . pantoprazole (PROTONIX) IV  40 mg Intravenous Q24H  . piperacillin-tazobactam  3.375 g Intravenous Once  . piperacillin-tazobactam (ZOSYN)  IV  3.375 g Intravenous 3 times per day  . potassium chloride  10 mEq Intravenous Q1 Hr x 4  . potassium chloride  40 mEq Oral Q4H  . rivaroxaban  15 mg Oral Daily  . vancomycin  750 mg Intravenous Q24H   Continuous Infusions:   PRN Meds:.albuterol, diltiazem  Micro Results Recent Results (from the past 240 hour(s))  Blood culture (routine x 2)     Status: None (Preliminary result)   Collection Time: 01/07/16  5:12 PM  Result Value Ref Range Status   Specimen Description BLOOD RIGHT ARM  Final   Special Requests BOTTLES DRAWN AEROBIC AND ANAEROBIC 5CC  Final   Culture  Setup Time   Final    GRAM POSITIVE COCCI IN CLUSTERS AEROBIC BOTTLE ONLY CRITICAL RESULT CALLED TO, READ BACK BY AND VERIFIED WITH: M PERROW 01/09/16 @ 0920  M VESTAL    Culture GRAM POSITIVE COCCI  Final   Report Status PENDING  Incomplete  Culture, Urine     Status: Abnormal (Preliminary result)   Collection Time: 01/07/16  6:05 PM  Result Value Ref Range Status   Specimen Description URINE, CATHETERIZED  Final   Special Requests ADDED 0023 01/08/16  Final   Culture >=100,000 COLONIES/mL PSEUDOMONAS AERUGINOSA (A)  Final   Report Status PENDING  Incomplete   Organism ID, Bacteria PSEUDOMONAS AERUGINOSA (A)  Final      Susceptibility   Pseudomonas aeruginosa - MIC*    CEFTAZIDIME 4 SENSITIVE Sensitive     CIPROFLOXACIN <=0.25 SENSITIVE Sensitive     GENTAMICIN 2 SENSITIVE Sensitive     IMIPENEM 0.5 SENSITIVE Sensitive     PIP/TAZO 8 SENSITIVE Sensitive     CEFEPIME 2 SENSITIVE Sensitive     * >=100,000 COLONIES/mL PSEUDOMONAS AERUGINOSA  Blood culture (routine x 2)     Status: None (Preliminary result)   Collection Time: 01/07/16  6:30 PM  Result Value Ref Range Status   Specimen Description BLOOD LEFT ARM  Final   Special Requests BOTTLES DRAWN AEROBIC  ONLY 5CC  Final   Culture  Setup Time   Final    GRAM POSITIVE COCCI IN CLUSTERS AEROBIC BOTTLE ONLY CRITICAL RESULT CALLED TO, READ BACK BY AND VERIFIED WITH: A PETTIFORD,RN @0525  01/09/16 MKELLY    Culture NO GROWTH 2 DAYS  Final   Report Status PENDING  Incomplete  MRSA PCR Screening     Status: Abnormal   Collection Time: 01/08/16  4:28 PM  Result Value Ref Range Status   MRSA by PCR POSITIVE (A) NEGATIVE Final    Comment:        The GeneXpert MRSA Assay (FDA approved for NASAL specimens only), is one component of a comprehensive MRSA colonization surveillance program. It is not intended to diagnose MRSA infection nor to guide or monitor treatment for MRSA infections. RESULT CALLED TO, READ BACK BY AND VERIFIED WITH: T.NEILSON,RN BY L.PITT AT 2158     Radiology Reports Ct Head Wo Contrast  01/07/2016  CLINICAL DATA:  Altered mental status over the last few days. Hypoxia with fluid retention. EXAM: CT HEAD WITHOUT CONTRAST TECHNIQUE: Contiguous axial images were obtained from the base of the skull through the vertex without intravenous contrast. COMPARISON:  11/05/2015 and 09/07/2012 examinations. FINDINGS: Brain: There is no evidence of acute intracranial hemorrhage, mass lesion, brain edema or extra-axial fluid collection. The ventricles and subarachnoid spaces are prominent but stable. There is no CT evidence of acute cortical infarction. There are chronic small vessel ischemic changes in the periventricular white matter and basal ganglia. Intracranial vascular calcifications are present. A small amount of air within the cavernous sinuses and infratemporal fossa is likely incidental, typically related to the presence of a an IV line. Bones/sinuses/visualized face: Mucosal thickening in the left maxillary sinus has improved. The visualized paranasal sinuses, mastoid air cells and middle ears are otherwise clear. The calvarium is intact. IMPRESSION: No acute intracranial findings.  Stable atrophy and chronic small vessel ischemic changes. Interval improved left maxillary sinus mucosal thickening. Electronically Signed   By: Richardean Sale M.D.   On: 01/07/2016 20:25   US Renal  01/08/2016  CLINICAL DATA:  Acute renal failure. EXAM: RENAL / URINARY TRACT ULTRASOUND COMPLETE COMPARISON:  One-view abdomen 09/25/2015. FINDINGS: Right Kidney: Length: 10.1 cm, within normal limits. Normal echogenicity is present. There are 2 exophytic cysts. These both appear normal. They  measure 3.5 x 3.4 x 3.4 cm at the lower pole and 1.2 x 1.2 x 1.5 cm near the upper pole. No other mass lesion or stone is present. Left Kidney: Length: 9.3 cm, within normal limits. Echogenicity within normal limits. No mass or hydronephrosis visualized. Bladder: The bladder is decompressed.  A Foley catheter is visualized. IMPRESSION: 1. Foley catheter in place. 2. Benign appearing cysts in the right kidney. 3. Normal echogenicity of the renal parenchyma without evidence for obstruction. Electronically Signed   By: San Morelle M.D.   On: 01/08/2016 12:15   Dg Chest Port 1 View  01/10/2016  CLINICAL DATA:  Shortness of breath. EXAM: PORTABLE CHEST 1 VIEW COMPARISON:  01/07/2016. FINDINGS: Trachea is midline. Heart size stable. There is patchy bibasilar airspace opacification, worsening on the left. Left hemidiaphragm is elevated. No definite pleural fluid. IMPRESSION: Bibasilar airspace opacification, worsening on the left, possibly due to atelectasis and/or pneumonia. Electronically Signed   By: Lorin Picket M.D.   On: 01/10/2016 07:32   Dg Chest Portable 1 View  01/07/2016  CLINICAL DATA:  Hypotensive.  Short of breath. EXAM: PORTABLE CHEST 1 VIEW COMPARISON:  10/03/2015 FINDINGS: Midline trachea. Cardiomegaly accentuated by AP portable technique. Atherosclerosis in the transverse aorta. No pleural effusion or pneumothorax. Mild left infrahilar atelectasis. Patchy right base airspace disease persists.  Inferior right upper lobe aeration is improved. IMPRESSION: Persistent or recurrent right base airspace disease, which could represent infection or atelectasis. Cardiomegaly with aortic atherosclerosis.  No congestive failure. Electronically Signed   By: Abigail Miyamoto M.D.   On: 01/07/2016 17:55    Time Spent in minutes  25   Faye Sanfilippo K M.D on 01/10/2016 at 9:11 AM  Between 7am to 7pm - Pager - [phone]  After 7pm go to [email or web address] - password [facility]  [facility] -  Office  [phone]

## 2016-01-11 LAB — CULTURE, BLOOD (ROUTINE X 2)

## 2016-01-11 LAB — MAGNESIUM: Magnesium: 1.8 mg/dL (ref 1.7–2.4)

## 2016-01-11 LAB — POTASSIUM: POTASSIUM: 4.7 mmol/L (ref 3.5–5.1)

## 2016-01-11 MED ORDER — IPRATROPIUM BROMIDE 0.02 % IN SOLN
0.5000 mg | Freq: Three times a day (TID) | RESPIRATORY_TRACT | Status: DC
  Administered 2016-01-11 – 2016-01-12 (×5): 0.5 mg via RESPIRATORY_TRACT
  Filled 2016-01-11 (×5): qty 2.5

## 2016-01-11 NOTE — Care Management Note (Addendum)
Case Management Note  Patient Details  Name: Caitlin Hampton MRN: [id number] Date of Birth: 03-30-1946  Subjective/Objective:                 Admitted with AMS/bacteremia and UTI, history of COPD with chronic O2 dependence (3-4L [place] at baseline), HTN, chronic afib/flutter (anticoagulated with Xarelto), diastolic heart failure, CAD, and dementia.   Action/Plan: Return to SNF when medically stable with palliative care to follow.  Expected Discharge Date:  01/28/2016               Expected Discharge Plan:  [place] [facility])  In-House Referral:  Clinical Social Work  Discharge planning Services  CM Consult  Post Acute Care Choice:    Choice offered to:     DME Arranged:    DME Agency:     HH Arranged:    HH Agency:     Status of Service:  In process, will continue to follow  Medicare Important Message Given:  Yes Date Medicare IM Given:    Medicare IM give by:    Date Additional Medicare IM Given:    Additional Medicare Important Message give by:     If discussed at [place] of Stay Meetings, dates discussed:    Additional Comments:  [name] (Sister)  [phone]  Sharin Mons, RN 01/11/2016, 12:32 PM

## 2016-01-11 NOTE — Progress Notes (Signed)
Pharmacy Antibiotic Note  Caitlin Hampton is a 70 y.o. female admitted on 01/07/2016 with bacteremia and UTI.  Pharmacy has been consulted for Vancomycin and Zosyn dosing.  Urine cultures have resulted as pan-sensitive PSA and blood cultures have resulted as 2/2 MSSE (also pan-sensitive). BCID results were discussed with Dr. Candiss Norse and the patient's antibiotics will be changed to Zosyn monotherapy. Repeat BCx were ordered on 4/19 - if cleared, plan is to transition the patient to po antibiotics on 4/21. Afebrile, WBC 11  Plan: 1. Discontinue Vancomycin 2. Continue Zosyn 3.375g IV every 8 hours (infused over 4 hours) 3. Will follow-up repeat blood cultures (checking for clearance) and possible transition to po antibiotics on 4/21  Height: 5\' 2"  (157.5 cm) Weight: 165 lb 9.1 oz (75.1 kg) IBW/kg (Calculated) : 50.1  Temp (24hrs), Avg:98.1 F (36.7 C), Min:97.7 F (36.5 C), Max:99 F (37.2 C)   Recent Labs Lab 01/07/16 1712 01/07/16 1718 01/07/16 1722 01/07/16 1949 01/08/16 0241 01/09/16 0248 01/10/16 0440  WBC 19.6*  --   --   --  14.4* 12.7* 11.0*  CREATININE 2.38* 2.40*  --   --  2.00* 1.76* 1.50*  LATICACIDVEN  --   --  2.17* 1.05  --   --   --     Estimated Creatinine Clearance: 33.1 mL/min (by C-G formula based on Cr of 1.5).    No Known Allergies  Antimicrobials this admission: Vanc 4/16 >> Zosyn 4/17 >>  Dose adjustments this admission: n/a  Microbiology results: 4/16 BCx >> 2/2 MSSE (pan-S) 4/16 UCx >> 100k PSA (pan-S) 4/19 BCx >> pending  Thank you for allowing pharmacy to be a part of this patient's care.  Alycia Rossetti, PharmD, BCPS Clinical Pharmacist Pager: [phone] 01/11/2016 11:51 AM

## 2016-01-11 NOTE — Progress Notes (Addendum)
Speech Language Pathology Treatment: Dysphagia  Patient Details Name: Caitlin Hampton MRN: [id number] DOB: 10/05/1945 Today's Date: 01/11/2016 Time: [id number] SLP Time Calculation (min) (ACUTE ONLY): 27 min  Assessment / Plan / Recommendation Clinical Impression  Pt observed to have immediate signs of aspiration with thin liquids, particularly with cup sips. Function improved with straw sips, but still some throat clearing noted after sips. Attempted to engage pt and her sister in a discussion regarding plan of care with POs. Advised them than Zetha is likely intermittently aspirating given subjective signs, poor positioning, weak cough and profound deconditioning. Discussed differences between objective testing and diet modification vs consuming POs of choice with known risk of aspiration. Pt did not like the sound of FEES or of thickened liquids, but her sister felt that testing would be beneficial and seemed to want to prevent aspiration if possible. Sister also seemed to have some unrealistic expectation of "fixing" problems and lack of awareness of her sister's complex condition. Pt agreeable to testing with her sister's encouragement, so will proceed with FEES tomorrow. Pt may continue current diet today.    HPI HPI: 70 y.o. woman with a history of COPD with chronic O2 dependence (3-4L [place] at baseline), HTN, chronic afib/flutter (anticoagulated with Xarelto), diastolic heart failure, CAD, and dementia who has been a resident in a skilled nursing facility since her admission here in December for HCAP and right leg ulcer. Being followed by Palliative Medicine.        SLP Plan  Other (Comment) (FEES)     Recommendations  Diet recommendations: Dysphagia 1 (puree);Thin liquid Liquids provided via: Cup Medication Administration: Whole meds with puree Supervision: Full supervision/cueing for compensatory strategies;Staff to assist with self feeding Compensations: Slow rate;Minimize environmental  distractions;Small sips/bites Postural Changes and/or Swallow Maneuvers: Seated upright 90 degrees             Oral Care Recommendations: Oral care BID Follow up Recommendations: Skilled Nursing facility Plan: Other (Comment) (FEES)     GO                Landis Cassaro, Katherene Ponto 01/11/2016, 12:07 PM

## 2016-01-11 NOTE — Care Management Important Message (Signed)
Important Message  Patient Details  Name: Caitlin Hampton MRN: [id number] Date of Birth: 1946/07/05   Medicare Important Message Given:  Yes    Sharin Mons, RN 01/11/2016, 12:01 PM

## 2016-01-11 NOTE — Progress Notes (Signed)
Nutrition Follow-up  DOCUMENTATION CODES:   Severe malnutrition in context of chronic illness  INTERVENTION:   -Magic Cup TID with meals  NUTRITION DIAGNOSIS:   Malnutrition related to chronic illness as evidenced by percent weight loss, energy intake < 75% for > or equal to 1 month.  Ongoing  GOAL:   Patient will meet greater than or equal to 90% of their needs  Progressing  MONITOR:   PO intake, Labs, Supplement acceptance, I & O's, Skin  REASON FOR ASSESSMENT:   Malnutrition Screening Tool    ASSESSMENT:   Caitlin Hampton is a 70 y.o. woman with a history of COPD with chronic O2 dependence (3-4L [place] at baseline), HTN, chronic afib/flutter (anticoagulated with Xarelto), diastolic heart failure, CAD, and dementia who has been a resident in a skilled nursing facility since her admission here in December for HCAP and right leg ulcer  Pt working with SLP at time of visit.   Pt seen for initial BSE on 01/10/16 and was advanced to a dysphagia 1 diet with thin liquids. Per SLP note from this morning, pt with potential for comfort feeds. Per SLP note, pt does not like the idea of thickened liquids. Pt will undergo FEES tomorrow for further objective swallow testing.  Reviewed [facility]OCN note from 01/08/16; pt with partial thickness skin tears on lt posterior shoulder and lt inner arm.   Palliative care team following; main goal is to maximize quality of life. Per MD notes, plan to d/c to SNF with palliative care follow-up once medically stable.   Labs reviewed: K: 2.7.   Diet Order:  DIET - DYS 1 Room service appropriate?: Yes; Fluid consistency:: Thin  Skin:  Wound (see comment) (partial thickness skin tears on lt inner arm and rt shoulder)  Last BM:  01/10/16  Height:   Ht Readings from Last 1 Encounters:  01/08/16 5\' 2"  (1.575 m)    Weight:   Wt Readings from Last 1 Encounters:  01/08/16 165 lb 9.1 oz (75.1 kg)    Ideal Body Weight:  50 kg  BMI:  Body mass index is  30.27 kg/(m^2).  Estimated Nutritional Needs:   Kcal:  1650-1850  Protein:  75-85 grams  Fluid:  1.6-1.8 L  EDUCATION NEEDS:   No education needs identified at this time  Santina Trillo A. Jimmye Norman, RD, LDN, CDE Pager: [phone] After hours Pager: [phone]

## 2016-01-11 NOTE — Progress Notes (Signed)
[facility] PROGRESS NOTE                                                                                                                                                                                                             Patient Demographics:    Caitlin Hampton, is a 70 y.o. female, DOB - 17-Mar-1946, [id number]  Admit date - 01/07/2016   Admitting Physician Lily Kocher, MD  Outpatient Primary MD for the patient is Gildardo Cranker, DO  LOS - 4  Outpatient Specialists:   Chief Complaint  Patient presents with  . Shortness of Breath       Brief Narrative      Subjective:    Caitlin Hampton today has, No headache, No chest pain, No abdominal pain - No Nausea, No new weakness tingling or numbness, No Cough - SOB. Unreliable historian   Assessment  & Plan :     1.Sepsis due to UTI and likely aspiration pneumonia With 2 out of 2 coag-negative staph blood culture positive- appears nontoxic, improved after IVF & empiric IV antibiotics which were vancomycin and Zosyn, clinically better we will leave her same antibiotics at this time, Speech following currently on dysphagia 1 diet, clinically better, added incentive spirometer and flutter valve. Clinically improved. Urine cultures noted on appropriate antibiotics. Pulmonary status improving as well. Redraw surveillance cultures on 01/10/2016.  Discussed with ID physician Dr. Johnnye Sima on 01/11/2016, plan is to wait for culture sensitivities to come back for blood cultures thereafter transitioned to oral antibiotics for a total of 14 days if she is clinically better and surveillance cultures remain negative.  Her overall quality of life appears very poor, she is severely deconditioned, most likely she is bedbound, has multiple bruises all over. Poor long-term prognosis, palliative care following, she is now DO NOT RESUSCITATE with goal of gentle medical treatment directed towards comfort,  discharged to SNF with palliative care follow-up in 1-2 days.  2. ARF. Likely due to sepsis in the setting of patient being on Lasix and potassium. Improving with hydration, renal ultrasound stable.  3. History of Chronic A. fib/flutter. [place] vasc 2 score of greater than 4. Due severe diffuse brusing and poor prognosis [name] stopped after DW family and patient. On increased dose Cardizem for better rate control along with as needed IV Cardizem. She follows with Dr. Marlou Porch for her cardiology issues. Cards following.  4. History of dementia, generalized  deconditioning, poor functional status. Continue home medications, supportive care, at risk for delirium. Minimize benzodiazepines and narcotics.  5. Hypothyroidism. Continue home dose Synthroid.  6. COPD. At baseline. Uses 4 L nasal cannula oxygen, continue oxygen supplementation along with nebulizer treatments.  7. Dyslipidemia. On statin continue.  8. GERD. On PPI.  9. History of CAD. Stable. Continue medications for secondary prevention which include statin, no aspirin as she is on xaralto. Chest pain-free.  10. Hypokalemia. Replaced and stable.   11. Black Tongue - better hygiene, trial of nystatin.   Code Status : DNR, prognosis appears poor, DW sister 01-08-16  Family Communication  : Discussed with patient's sister in detail on 01/08/2016  Disposition Plan  : SNF 1-2 days with palliative care follow-up  Barriers For Discharge : UTI/sepsis  Consults  :  Pall Care  Procedures  :   CT head nonacute  DVT Prophylaxis  :  SCDs  Lab Results  Component Value Date   PLT 198 01/10/2016    Antibiotics  :    Anti-infectives    Start     Dose/Rate Route Frequency Ordered Stop   01/10/16 1800  vancomycin (VANCOCIN) IVPB 750 mg/150 ml premix     750 mg 150 mL/hr over 60 Minutes Intravenous Every 24 hours 01/10/16 1114     01/08/16 1800  vancomycin (VANCOCIN) IVPB 750 mg/150 ml premix  Status:  Discontinued     750 mg 150  mL/hr over 60 Minutes Intravenous Every 24 hours 01/07/16 1804 01/10/16 0914   01/08/16 0000  piperacillin-tazobactam (ZOSYN) IVPB 3.375 g     3.375 g 12.5 mL/hr over 240 Minutes Intravenous 3 times per day 01/07/16 1804     01/07/16 1830  vancomycin (VANCOCIN) 1,500 mg in sodium chloride 0.9 % 500 mL IVPB     1,500 mg 250 mL/hr over 120 Minutes Intravenous  Once 01/07/16 1755 01/07/16 2143   01/07/16 1800  piperacillin-tazobactam (ZOSYN) IVPB 3.375 g  Status:  Discontinued     3.375 g 100 mL/hr over 30 Minutes Intravenous  Once 01/07/16 1755 01/11/16 1017        Objective:   Filed Vitals:   01/10/16 1643 01/10/16 2114 01/10/16 2253 01/11/16 0615  BP: 110/63  105/44 93/70  Pulse: 85  94 101  Temp: 99 F (37.2 C)  97.8 F (36.6 C) 97.7 F (36.5 C)  TempSrc: Oral     Resp:   18 17  Height:      Weight:      SpO2: 94% 96% 87% 93%    Wt Readings from Last 3 Encounters:  01/08/16 75.1 kg (165 lb 9.1 oz)  12/21/15 77.735 kg (171 lb 6 oz)  11/15/15 82.464 kg (181 lb 12.8 oz)     Intake/Output Summary (Last 24 hours) at 01/11/16 1030 Last data filed at 01/11/16 [id number]  Gross per 24 hour  Intake    440 ml  Output    350 ml  Net     90 ml     Physical Exam  Awake , able answer basic questions and follow basic commands, No new F.N deficits, Normal affect Earl.AT,PERRAL, Black tongue Supple Neck,No JVD, No cervical lymphadenopathy appriciated.  Symmetrical Chest wall movement, Good air movement bilaterally, CTAB RRR,No Gallops,Rubs or new Murmurs, No Parasternal Heave +ve B.Sounds, Abd Soft, No tenderness, No organomegaly appriciated, No rebound - guarding or rigidity. No Cyanosis, Clubbing or edema, No new Rash , Multiple diffuse bruises, chronic contractures  Data Review:    CBC  Recent Labs Lab 01/07/16 1712 01/07/16 1718 01/08/16 0241 01/09/16 0248 01/10/16 0440  WBC 19.6*  --  14.4* 12.7* 11.0*  HGB 12.7 15.6* 10.5* 11.0* 10.1*  HCT 39.9 46.0 31.9*  35.6* 32.7*  PLT 283  --  241 217 198  MCV 92.4  --  93.0 93.4 92.9  MCH 29.4  --  30.6 28.9 28.7  MCHC 31.8  --  32.9 30.9 30.9  RDW 15.8*  --  16.4* 16.1* 16.1*  LYMPHSABS 1.0  --  0.1*  --   --   MONOABS 0.6  --  0.4  --   --   EOSABS 0.0  --  0.1  --   --   BASOSABS 0.0  --  0.0  --   --     Chemistries   Recent Labs Lab 01/07/16 1712 01/07/16 1718 01/08/16 0241 01/09/16 0248 01/10/16 0440 01/11/16 0542  NA 132* 131* 134* 141 142  --   K 4.0 3.8 3.4* 3.6 2.7* 4.7  CL 91* 92* 99* 108 109  --   CO2 25  --  22 16* 19*  --   GLUCOSE 107* 101* 90 88 112*  --   BUN 55* 51* 54* 43* 38*  --   CREATININE 2.38* 2.40* 2.00* 1.76* 1.50*  --   CALCIUM 8.4*  --  7.4* 7.6* 7.9*  --   MG  --   --   --   --   --  1.8  AST 68*  --   --   --   --   --   ALT 41  --   --   --   --   --   ALKPHOS 97  --   --   --   --   --   BILITOT 1.2  --   --   --   --   --    ------------------------------------------------------------------------------------------------------------------ No results for input(s): CHOL, HDL, LDLCALC, TRIG, CHOLHDL, LDLDIRECT in the last 72 hours.  Lab Results  Component Value Date   HGBA1C 5.7* 01/08/2016   ------------------------------------------------------------------------------------------------------------------  Recent Labs  01/10/16 0440  TSH 4.095   ------------------------------------------------------------------------------------------------------------------ No results for input(s): VITAMINB12, FOLATE, FERRITIN, TIBC, IRON, RETICCTPCT in the last 72 hours.  Coagulation profile  Recent Labs Lab 01/07/16 1712  INR 2.53*    No results for input(s): DDIMER in the last 72 hours.  Cardiac Enzymes No results for input(s): CKMB, TROPONINI, MYOGLOBIN in the last 168 hours.  Invalid input(s): CK ------------------------------------------------------------------------------------------------------------------    Component Value Date/Time    BNP 309.1* 01/07/2016 1712    Inpatient Medications  Scheduled Meds: . antiseptic oral rinse  7 mL Mouth Rinse q12n4p  . arformoterol  15 mcg Nebulization BID  . aspirin  81 mg Oral Daily  . atorvastatin  10 mg Oral Daily  . budesonide  0.25 mg Nebulization BID  . chlorhexidine  15 mL Mouth Rinse BID  . Chlorhexidine Gluconate Cloth  6 each Topical Q0600  . diltiazem  240 mg Oral Daily  . docusate sodium  100 mg Oral BID  . Glycerin (Adult)  1 suppository Rectal Once  . ipratropium  0.5 mg Nebulization TID  . levothyroxine  50 mcg Intravenous Daily  . mupirocin ointment  1 application Nasal BID  . nystatin  5 mL Oral QID  . pantoprazole (PROTONIX) IV  40 mg Intravenous Q24H  . piperacillin-tazobactam (ZOSYN)  IV  3.375 g Intravenous  3 times per day  . vancomycin  750 mg Intravenous Q24H   Continuous Infusions:   PRN Meds:.albuterol, diltiazem  Micro Results Recent Results (from the past 240 hour(s))  Blood culture (routine x 2)     Status: Abnormal (Preliminary result)   Collection Time: 01/07/16  5:12 PM  Result Value Ref Range Status   Specimen Description BLOOD RIGHT ARM  Final   Special Requests BOTTLES DRAWN AEROBIC AND ANAEROBIC 5CC  Final   Culture  Setup Time   Final    GRAM POSITIVE COCCI IN CLUSTERS AEROBIC BOTTLE ONLY CRITICAL RESULT CALLED TO, READ BACK BY AND VERIFIED WITH: M PERROW 01/09/16 @ 27 M VESTAL    Culture (A)  Final    STAPHYLOCOCCUS SPECIES (COAGULASE NEGATIVE) SUSCEPTIBILITIES TO FOLLOW    Report Status PENDING  Incomplete  Culture, Urine     Status: Abnormal   Collection Time: 01/07/16  6:05 PM  Result Value Ref Range Status   Specimen Description URINE, CATHETERIZED  Final   Special Requests ADDED 0023 01/08/16  Final   Culture >=100,000 COLONIES/mL PSEUDOMONAS AERUGINOSA (A)  Final   Report Status 01/10/2016 FINAL  Final   Organism ID, Bacteria PSEUDOMONAS AERUGINOSA (A)  Final      Susceptibility   Pseudomonas aeruginosa - MIC*     CEFTAZIDIME 4 SENSITIVE Sensitive     CIPROFLOXACIN <=0.25 SENSITIVE Sensitive     GENTAMICIN 2 SENSITIVE Sensitive     IMIPENEM 0.5 SENSITIVE Sensitive     PIP/TAZO 8 SENSITIVE Sensitive     CEFEPIME 2 SENSITIVE Sensitive     * >=100,000 COLONIES/mL PSEUDOMONAS AERUGINOSA  Blood culture (routine x 2)     Status: Abnormal (Preliminary result)   Collection Time: 01/07/16  6:30 PM  Result Value Ref Range Status   Specimen Description BLOOD LEFT ARM  Final   Special Requests BOTTLES DRAWN AEROBIC ONLY 5CC  Final   Culture  Setup Time   Final    GRAM POSITIVE COCCI IN CLUSTERS AEROBIC BOTTLE ONLY CRITICAL RESULT CALLED TO, READ BACK BY AND VERIFIED WITH: A PETTIFORD,RN @0525  01/09/16 MKELLY    Culture (A)  Final    STAPHYLOCOCCUS SPECIES (COAGULASE NEGATIVE) SUSCEPTIBILITIES TO FOLLOW    Report Status PENDING  Incomplete  MRSA PCR Screening     Status: Abnormal   Collection Time: 01/08/16  4:28 PM  Result Value Ref Range Status   MRSA by PCR POSITIVE (A) NEGATIVE Final    Comment:        The GeneXpert MRSA Assay (FDA approved for NASAL specimens only), is one component of a comprehensive MRSA colonization surveillance program. It is not intended to diagnose MRSA infection nor to guide or monitor treatment for MRSA infections. RESULT CALLED TO, READ BACK BY AND VERIFIED WITH: T.NEILSON,RN BY L.PITT AT 2158     Radiology Reports Ct Head Wo Contrast  01/07/2016  CLINICAL DATA:  Altered mental status over the last few days. Hypoxia with fluid retention. EXAM: CT HEAD WITHOUT CONTRAST TECHNIQUE: Contiguous axial images were obtained from the base of the skull through the vertex without intravenous contrast. COMPARISON:  11/05/2015 and 09/07/2012 examinations. FINDINGS: Brain: There is no evidence of acute intracranial hemorrhage, mass lesion, brain edema or extra-axial fluid collection. The ventricles and subarachnoid spaces are prominent but stable. There is no CT evidence of acute  cortical infarction. There are chronic small vessel ischemic changes in the periventricular white matter and basal ganglia. Intracranial vascular calcifications are present. A small amount of  air within the cavernous sinuses and infratemporal fossa is likely incidental, typically related to the presence of a an IV line. Bones/sinuses/visualized face: Mucosal thickening in the left maxillary sinus has improved. The visualized paranasal sinuses, mastoid air cells and middle ears are otherwise clear. The calvarium is intact. IMPRESSION: No acute intracranial findings. Stable atrophy and chronic small vessel ischemic changes. Interval improved left maxillary sinus mucosal thickening. Electronically Signed   By: Richardean Sale M.D.   On: 01/07/2016 20:25   US Renal  01/08/2016  CLINICAL DATA:  Acute renal failure. EXAM: RENAL / URINARY TRACT ULTRASOUND COMPLETE COMPARISON:  One-view abdomen 09/25/2015. FINDINGS: Right Kidney: Length: 10.1 cm, within normal limits. Normal echogenicity is present. There are 2 exophytic cysts. These both appear normal. They measure 3.5 x 3.4 x 3.4 cm at the lower pole and 1.2 x 1.2 x 1.5 cm near the upper pole. No other mass lesion or stone is present. Left Kidney: Length: 9.3 cm, within normal limits. Echogenicity within normal limits. No mass or hydronephrosis visualized. Bladder: The bladder is decompressed.  A Foley catheter is visualized. IMPRESSION: 1. Foley catheter in place. 2. Benign appearing cysts in the right kidney. 3. Normal echogenicity of the renal parenchyma without evidence for obstruction. Electronically Signed   By: San Morelle M.D.   On: 01/08/2016 12:15   Dg Chest Port 1 View  01/10/2016  CLINICAL DATA:  Shortness of breath. EXAM: PORTABLE CHEST 1 VIEW COMPARISON:  01/07/2016. FINDINGS: Trachea is midline. Heart size stable. There is patchy bibasilar airspace opacification, worsening on the left. Left hemidiaphragm is elevated. No definite pleural  fluid. IMPRESSION: Bibasilar airspace opacification, worsening on the left, possibly due to atelectasis and/or pneumonia. Electronically Signed   By: Lorin Picket M.D.   On: 01/10/2016 07:32   Dg Chest Portable 1 View  01/07/2016  CLINICAL DATA:  Hypotensive.  Short of breath. EXAM: PORTABLE CHEST 1 VIEW COMPARISON:  10/03/2015 FINDINGS: Midline trachea. Cardiomegaly accentuated by AP portable technique. Atherosclerosis in the transverse aorta. No pleural effusion or pneumothorax. Mild left infrahilar atelectasis. Patchy right base airspace disease persists. Inferior right upper lobe aeration is improved. IMPRESSION: Persistent or recurrent right base airspace disease, which could represent infection or atelectasis. Cardiomegaly with aortic atherosclerosis.  No congestive failure. Electronically Signed   By: Abigail Miyamoto M.D.   On: 01/07/2016 17:55    Time Spent in minutes  25   Frady Taddeo K M.D on 01/11/2016 at 10:30 AM  Between 7am to 7pm - Pager - [phone]  After 7pm go to [email or web address] - password [facility]  [facility] -  Office  [phone]

## 2016-01-11 NOTE — Consult Note (Signed)
Adolph Clutter, EdD 

## 2016-01-12 DIAGNOSIS — E872 Acidosis, unspecified: Secondary | ICD-10-CM | POA: Insufficient documentation

## 2016-01-12 DIAGNOSIS — N179 Acute kidney failure, unspecified: Secondary | ICD-10-CM | POA: Insufficient documentation

## 2016-01-12 DIAGNOSIS — N170 Acute kidney failure with tubular necrosis: Secondary | ICD-10-CM

## 2016-01-12 DIAGNOSIS — R0602 Shortness of breath: Secondary | ICD-10-CM

## 2016-01-12 DIAGNOSIS — J189 Pneumonia, unspecified organism: Secondary | ICD-10-CM

## 2016-01-12 DIAGNOSIS — Z515 Encounter for palliative care: Secondary | ICD-10-CM

## 2016-01-12 MED ORDER — CLINDAMYCIN HCL 300 MG PO CAPS: 300.0000 mg | ORAL_CAPSULE | Freq: Three times a day (TID) | ORAL | Status: AC

## 2016-01-12 MED ORDER — ASPIRIN 81 MG PO CHEW: 81.0000 mg | CHEWABLE_TABLET | Freq: Every day | ORAL | Status: AC

## 2016-01-12 MED ORDER — LORAZEPAM 2 MG/ML PO CONC: 1.0000 mg | Freq: Four times a day (QID) | ORAL | Status: AC | PRN

## 2016-01-12 MED ORDER — DILTIAZEM HCL ER COATED BEADS 240 MG PO CP24: 240.0000 mg | ORAL_CAPSULE | Freq: Every day | ORAL | Status: AC

## 2016-01-12 MED ORDER — SACCHAROMYCES BOULARDII 250 MG PO CAPS
250.0000 mg | ORAL_CAPSULE | Freq: Two times a day (BID) | ORAL | Status: DC
  Filled 2016-01-12: qty 1

## 2016-01-12 MED ORDER — SACCHAROMYCES BOULARDII 250 MG PO CAPS: 250.0000 mg | ORAL_CAPSULE | Freq: Two times a day (BID) | ORAL | Status: AC

## 2016-01-12 MED ORDER — MORPHINE SULFATE (CONCENTRATE) 10 MG/0.5ML PO SOLN: 10.0000 mg | ORAL | Status: AC | PRN

## 2016-01-12 MED ORDER — CLINDAMYCIN HCL 300 MG PO CAPS
300.0000 mg | ORAL_CAPSULE | Freq: Three times a day (TID) | ORAL | Status: DC
  Administered 2016-01-12: 300 mg via ORAL
  Filled 2016-01-12: qty 1

## 2016-01-12 NOTE — Discharge Summary (Addendum)
Caitlin Hampton, is a 70 y.o. female  DOB Mar 31, 1946  MRN [id number].  Admission date:  01/07/2016  Admitting Physician  Lily Kocher, MD  Discharge Date:  01/12/2016   Primary MD  Gildardo Cranker, DO  Recommendations for primary care physician for things to follow:    Check CBC, BMP and CXR in a week  If declines further full comfort care   Admission Diagnosis  Lactic acidosis [E87.2] Delirium [R41.0] ARF (acute renal failure) (HCC) [N17.9] AKI (acute kidney injury) ([place]) [N17.9] HAP (hospital-acquired pneumonia) [J18.9] Sepsis, due to unspecified organism [facility]) [A41.9]   Discharge Diagnosis  Lactic acidosis [E87.2] Delirium [R41.0] ARF (acute renal failure) ([place]) [N17.9] AKI (acute kidney injury) ([place]) [N17.9] HAP (hospital-acquired pneumonia) [J18.9] Sepsis, due to unspecified organism ([place]) [A41.9]    Principal Problem:   Sepsis ([place]) Active Problems:   Atrial flutter ([place])   Elevated troponin   RLL pneumonia   AKI (acute kidney injury) ([place])   Goals of care, counseling/discussion   Palliative care encounter   Loss of weight   Thrush of mouth and esophagus ([place])   Infrequent bowel movements   Encounter for palliative care   Thrush   ARF (acute renal failure) ([place])   Lactic acidosis   SOB (shortness of breath)      Past Medical History  Diagnosis Date  . Hypertension   . Thyroid disease   . COPD (chronic obstructive pulmonary disease) ([place])   . Anxiety   . H/O hiatal hernia   . Hypothyroidism   . Shortness of breath   . Atrial flutter ([place]) 09/07/2012    , RVR/ hospital 12/13- rate control and Echo normal- on xarelto, when spontaneously converted she had bradycardia on Metoprolol and Digoxin  . GERD (gastroesophageal reflux disease)   . Rhinitis   . Parotitis     , on CT of face-  12/13  . CHF (congestive heart failure) ([place])   . Hx of echocardiogram     Echo (8/15):  EF 60-65%  . NSTEMI (non-ST elevated myocardial infarction) ([place])   . CAP (community acquired pneumonia) 09/14/2015    Past Surgical History  Procedure Laterality Date  . Tonsillectomy    . Breast surgery      benign R brest CA  . Cardioversion N/A 04/02/2014    Procedure: CARDIOVERSION;  Surgeon: Josue Hector, MD;  Location: [place];  Service: Cardiovascular;  Laterality: N/A;  . Cardioversion N/A 08/17/2014    Procedure: CARDIOVERSION;  Surgeon: [name], MD;  Location: [place];  Service: Cardiovascular;  Laterality: N/A;  . I&d extremity Right 09/18/2015    Procedure: IRRIGATION AND DEBRIDEMENT RIGHT LOWER LEG.;  Surgeon: Mcarthur Rossetti, MD;  Location: [place];  Service: Orthopedics;  Laterality: Right;  . Application of wound vac Right 09/18/2015    Procedure: APPLICATION OF WOUND VAC RIGHT LOWER LEG;  Surgeon: Mcarthur Rossetti, MD;  Location: [place];  Service: Orthopedics;  Laterality: Right;  . Skin split graft Right 09/23/2015    Procedure:  SKIN GRAFT SPLIT THICKNESS RIGHT LEG;  Surgeon: Mcarthur Rossetti, MD;  Location: [place];  Service: Orthopedics;  Laterality: Right;  Right Thigh       HPI  from the history and physical done on the day of admission:    Caitlin Hampton is a 70 y.o. woman with a history of COPD with chronic O2 dependence (3-4L [place] at baseline), HTN, chronic afib/flutter (anticoagulated with Xarelto), diastolic heart failure, CAD, and dementia who has been a resident in a skilled nursing facility since her admission here in December for HCAP and right leg ulcer. She has a sister and cousin at bedside who give her clinical history. She has not really been motivated to work with PT and she has had poor appetite overall since her last discharge. Family noticed acute changes within the past 24 hours, including altered mental status and increased lethargy.  No documented fever. No focal pain complaints. No nausea or vomiting. No blood in her urine or stool. Extensive bruising to her chest and all four extremities. Her sister feels that this has been worse since she was put on Xarelto. She has had decreased urine output.  ED evaluation today concerning for sepsis with elevated lactic acid level, AKI, significant leukocytosis, and persistent RLL infiltrate on chest xray. U/A and head CT are pending at time of admission.     Hospital Course:     1.Sepsis due to UTI and likely aspiration pneumonia With 2 out of 2 coag-negative staph blood culture positive- appears nontoxic, improved after IVF & empiric IV antibiotics which were vancomycin and Zosyn, clinically better .Speech following currently on dysphagia 1 diet, clinically better, added incentive spirometer and flutter valve. Clinically improved. Urine cultures noted on appropriate antibiotics. Pulmonary status improving as well. Redraw surveillance cultures on 01/10/2016.  Discussed with ID physician Dr. Johnnye Sima on 01/11/2016, plan is to place her on 9 more days of oral clindamycin and stop vancomycin at this time, coag-negative staph is pansensitive, she has completed her treatment for UTI.  Her overall quality of life appears very poor, she is severely deconditioned, most likely she is bedbound, has multiple bruises all over. Poor long-term prognosis, palliative care following, she is now DO NOT RESUSCITATE with goal of gentle medical treatment directed towards comfort, discharged to SNF with palliative care follow-up.   2. ARF. Likely due to sepsis in the setting of patient being on Lasix and potassium. Improved with hydration, renal ultrasound stable.  3. History of Chronic A. fib/flutter. [place] vasc 2 score of greater than 4. Due severe diffuse brusing and poor prognosis [name] stopped after DW family and patient. On increased dose Cardizem for better rate control along with as needed IV  Cardizem. She follows with Dr. Marlou Porch for her cardiology issues. Cards following.  4. History of dementia, generalized deconditioning, poor functional status. Continue home medications, supportive care, at risk for delirium. Minimize benzodiazepines and narcotics.  5. Hypothyroidism. Continue home dose Synthroid.  6. COPD. At baseline. Uses 4 L nasal cannula oxygen, continue oxygen supplementation along with nebulizer treatments.  7. Dyslipidemia. On statin continue.  8. GERD. On PPI.  9. History of CAD. Stable. Continue medications for secondary prevention which include statin, on ASA now. Chest pain-free.  10. Hypokalemia. Replaced and stable.   11. Black Tongue - better hygiene, was given a trial of nystatin.  12. Dysphagia - I explained to the sister and patient this morning, aspiration risk is high, feeding for comofrt and pleasure, involve speech, if  declines focus on full comfort.     Follow UP  Follow-up Information    Follow up with Gildardo Cranker, DO. Schedule an appointment as soon as possible for a visit in 1 week.   Specialty:  Internal Medicine   Contact information:   [place] [id number] [phone]        Consults obtained - [place]  Discharge Condition: Guarded  Diet and Activity recommendation: See Discharge Instructions below  Discharge Instructions           Discharge Instructions    Discharge instructions    Complete by:  As directed   Follow with Primary MD Gildardo Cranker, DO in 7 days   Get CBC, CMP, 2 view Chest X ray checked  by Primary MD next visit.    Activity: As tolerated with Full fall precautions use walker/cane & assistance as needed   Disposition SNF   Diet: Dysphagia 1 diet with feeding assistance and aspiration precautions.  For Heart failure patients - Check your Weight same time everyday, if you gain over 2 pounds, or you develop in leg swelling, experience more shortness of breath or chest pain,  call your Primary MD immediately. Follow Cardiac Low Salt Diet and 1.5 lit/day fluid restriction.   On your next visit with your primary care physician please Get Medicines reviewed and adjusted.   Please request your Prim.MD to go over all Hospital Tests and Procedure/Radiological results at the follow up, please get all Hospital records sent to your Prim MD by signing hospital release before you go home.   If you experience worsening of your admission symptoms, develop shortness of breath, life threatening emergency, suicidal or homicidal thoughts you must seek medical attention immediately by calling 911 or calling your MD immediately  if symptoms less severe.  You Must read complete instructions/literature along with all the possible adverse reactions/side effects for all the Medicines you take and that have been prescribed to you. Take any new Medicines after you have completely understood and accpet all the possible adverse reactions/side effects.   Do not drive, operating heavy machinery, perform activities at heights, swimming or participation in water activities or provide baby sitting services if your were admitted for syncope or siezures until you have seen by Primary MD or a Neurologist and advised to do so again.  Do not drive when taking Pain medications.    Do not take more than prescribed Pain, Sleep and Anxiety Medications  Special Instructions: If you have smoked or chewed Tobacco  in the last 2 yrs please stop smoking, stop any regular Alcohol  and or any Recreational drug use.  Wear Seat belts while driving.   Please note  You were cared for by a hospitalist during your hospital stay. If you have any questions about your discharge medications or the care you received while you were in the hospital after you are discharged, you can call the unit and asked to speak with the hospitalist on call if the hospitalist that took care of you is not available. Once you are  discharged, your primary care physician will handle any further medical issues. Please note that NO REFILLS for any discharge medications will be authorized once you are discharged, as it is imperative that you return to your primary care physician (or establish a relationship with a primary care physician if you do not have one) for your aftercare needs so that they can reassess your need for medications and monitor your  lab values.     Increase activity slowly    Complete by:  As directed              Discharge Medications       Medication List    STOP taking these medications        cloNIDine 0.1 MG tablet  Commonly known as:  CATAPRES     HYDROcodone-acetaminophen 5-325 MG tablet  Commonly known as:  NORCO/VICODIN     potassium chloride 10 MEQ tablet  Commonly known as:  K-DUR     rivaroxaban 20 MG Tabs tablet  Commonly known as:  XARELTO      TAKE these medications        acetaminophen 500 MG tablet  Commonly known as:  TYLENOL  Take 1,000 mg by mouth every 6 (six) hours as needed for mild pain.     albuterol (2.5 MG/3ML) 0.083% nebulizer solution  Commonly known as:  PROVENTIL  Take 2.5 mg by nebulization every 6 (six) hours as needed for wheezing or shortness of breath.     arformoterol 15 MCG/2ML Nebu  Commonly known as:  BROVANA  Take 2 mLs (15 mcg total) by nebulization 2 (two) times daily.     aspirin 81 MG chewable tablet  Chew 1 tablet (81 mg total) by mouth daily.     atorvastatin 10 MG tablet  Commonly known as:  LIPITOR  Take 10 mg by mouth daily at 6 PM.     bisacodyl 5 MG EC tablet  Commonly known as:  DULCOLAX  Take 2 tablets (10 mg total) by mouth daily as needed for moderate constipation.     bisacodyl 10 MG suppository  Commonly known as:  DULCOLAX  Place 1 suppository (10 mg total) rectally daily as needed for moderate constipation.     budesonide 0.25 MG/2ML nebulizer solution  Commonly known as:  PULMICORT  Take 2 mLs (0.25 mg  total) by nebulization 2 (two) times daily.     budesonide-formoterol 160-4.5 MCG/ACT inhaler  Commonly known as:  SYMBICORT  Inhale 2 puffs into the lungs 2 (two) times daily. 9am, 5pm     clindamycin 300 MG capsule  Commonly known as:  CLEOCIN  Take 1 capsule (300 mg total) by mouth every 8 (eight) hours. For 9 days     diltiazem 240 MG 24 hr capsule  Commonly known as:  CARDIZEM CD  Take 1 capsule (240 mg total) by mouth daily.     feeding supplement (PRO-STAT SUGAR FREE 64) Liqd  Take 30 mLs by mouth 2 (two) times daily.     furosemide 40 MG tablet  Commonly known as:  LASIX  Take 1.5 tablets (60 mg total) by mouth 2 (two) times daily.     gabapentin 100 MG capsule  Commonly known as:  NEURONTIN  Take 100 mg by mouth 3 (three) times daily. 12pm, 4pm, 8pm     iron polysaccharides 150 MG capsule  Commonly known as:  NIFEREX  Take 1 capsule (150 mg total) by mouth 2 (two) times daily.     levothyroxine 100 MCG tablet  Commonly known as:  SYNTHROID, LEVOTHROID  Take 100 mcg by mouth daily before breakfast.     LORazepam 2 MG/ML concentrated solution  Commonly known as:  ATIVAN  Take 0.5 mLs (1 mg total) by mouth every 6 (six) hours as needed for anxiety.     mirtazapine 15 MG tablet  Commonly known as:  REMERON  Take 15 mg  by mouth at bedtime.     morphine CONCENTRATE 10 MG/0.5ML Soln concentrated solution  Take 0.5 mLs (10 mg total) by mouth every 3 (three) hours as needed for moderate pain or severe pain.     NUTRITIONAL SUPPLEMENT PO  Take by mouth 2 (two) times daily. "Magic cup" 9am, 5pm     OXYGEN  Inhale into the lungs continuous. 3L     pantoprazole 40 MG tablet  Commonly known as:  PROTONIX  Take 1 tablet (40 mg total) by mouth daily at 12 noon.     potassium chloride SA 20 MEQ tablet  Commonly known as:  K-DUR,KLOR-CON  TAKE ONE TABLET EACH DAY     saccharomyces boulardii 250 MG capsule  Commonly known as:  FLORASTOR  Take 1 capsule (250 mg total)  by mouth 2 (two) times daily.     senna-docusate 8.6-50 MG tablet  Commonly known as:  Senokot-S  Take 2 tablets by mouth 2 (two) times daily.     tiotropium 18 MCG inhalation capsule  Commonly known as:  SPIRIVA  Place 18 mcg into inhaler and inhale daily.     Vitamin D3 50000 units Caps  Take 50,000 Units by mouth every Saturday.        Major procedures and Radiology Reports - PLEASE review detailed and final reports for all details, in brief -    CT head nonacute   Ct Head Wo Contrast  01/07/2016  CLINICAL DATA:  Altered mental status over the last few days. Hypoxia with fluid retention. EXAM: CT HEAD WITHOUT CONTRAST TECHNIQUE: Contiguous axial images were obtained from the base of the skull through the vertex without intravenous contrast. COMPARISON:  11/05/2015 and 09/07/2012 examinations. FINDINGS: Brain: There is no evidence of acute intracranial hemorrhage, mass lesion, brain edema or extra-axial fluid collection. The ventricles and subarachnoid spaces are prominent but stable. There is no CT evidence of acute cortical infarction. There are chronic small vessel ischemic changes in the periventricular white matter and basal ganglia. Intracranial vascular calcifications are present. A small amount of air within the cavernous sinuses and infratemporal fossa is likely incidental, typically related to the presence of a an IV line. Bones/sinuses/visualized face: Mucosal thickening in the left maxillary sinus has improved. The visualized paranasal sinuses, mastoid air cells and middle ears are otherwise clear. The calvarium is intact. IMPRESSION: No acute intracranial findings. Stable atrophy and chronic small vessel ischemic changes. Interval improved left maxillary sinus mucosal thickening. Electronically Signed   By: Richardean Sale M.D.   On: 01/07/2016 20:25   US Renal  01/08/2016  CLINICAL DATA:  Acute renal failure. EXAM: RENAL / URINARY TRACT ULTRASOUND COMPLETE COMPARISON:   One-view abdomen 09/25/2015. FINDINGS: Right Kidney: Length: 10.1 cm, within normal limits. Normal echogenicity is present. There are 2 exophytic cysts. These both appear normal. They measure 3.5 x 3.4 x 3.4 cm at the lower pole and 1.2 x 1.2 x 1.5 cm near the upper pole. No other mass lesion or stone is present. Left Kidney: Length: 9.3 cm, within normal limits. Echogenicity within normal limits. No mass or hydronephrosis visualized. Bladder: The bladder is decompressed.  A Foley catheter is visualized. IMPRESSION: 1. Foley catheter in place. 2. Benign appearing cysts in the right kidney. 3. Normal echogenicity of the renal parenchyma without evidence for obstruction. Electronically Signed   By: San Morelle M.D.   On: 01/08/2016 12:15   Dg Chest Port 1 View  01/10/2016  CLINICAL DATA:  Shortness of breath. EXAM:  PORTABLE CHEST 1 VIEW COMPARISON:  01/07/2016. FINDINGS: Trachea is midline. Heart size stable. There is patchy bibasilar airspace opacification, worsening on the left. Left hemidiaphragm is elevated. No definite pleural fluid. IMPRESSION: Bibasilar airspace opacification, worsening on the left, possibly due to atelectasis and/or pneumonia. Electronically Signed   By: Lorin Picket M.D.   On: 01/10/2016 07:32   Dg Chest Portable 1 View  01/07/2016  CLINICAL DATA:  Hypotensive.  Short of breath. EXAM: PORTABLE CHEST 1 VIEW COMPARISON:  10/03/2015 FINDINGS: Midline trachea. Cardiomegaly accentuated by AP portable technique. Atherosclerosis in the transverse aorta. No pleural effusion or pneumothorax. Mild left infrahilar atelectasis. Patchy right base airspace disease persists. Inferior right upper lobe aeration is improved. IMPRESSION: Persistent or recurrent right base airspace disease, which could represent infection or atelectasis. Cardiomegaly with aortic atherosclerosis.  No congestive failure. Electronically Signed   By: Abigail Miyamoto M.D.   On: 01/07/2016 17:55    Micro Results       Recent Results (from the past 240 hour(s))  Blood culture (routine x 2)     Status: Abnormal   Collection Time: 01/07/16  5:12 PM  Result Value Ref Range Status   Specimen Description BLOOD RIGHT ARM  Final   Special Requests BOTTLES DRAWN AEROBIC AND ANAEROBIC 5CC  Final   Culture  Setup Time   Final    GRAM POSITIVE COCCI IN CLUSTERS AEROBIC BOTTLE ONLY CRITICAL RESULT CALLED TO, READ BACK BY AND VERIFIED WITH: M PERROW 01/09/16 @ 51 M VESTAL    Culture (A)  Final    STAPHYLOCOCCUS SPECIES (COAGULASE NEGATIVE) SUSCEPTIBILITIES PERFORMED ON PREVIOUS CULTURE WITHIN THE LAST 5 DAYS.    Report Status 01/11/2016 FINAL  Final  Culture, Urine     Status: Abnormal   Collection Time: 01/07/16  6:05 PM  Result Value Ref Range Status   Specimen Description URINE, CATHETERIZED  Final   Special Requests ADDED 0023 01/08/16  Final   Culture >=100,000 COLONIES/mL PSEUDOMONAS AERUGINOSA (A)  Final   Report Status 01/10/2016 FINAL  Final   Organism ID, Bacteria PSEUDOMONAS AERUGINOSA (A)  Final      Susceptibility   Pseudomonas aeruginosa - MIC*    CEFTAZIDIME 4 SENSITIVE Sensitive     CIPROFLOXACIN <=0.25 SENSITIVE Sensitive     GENTAMICIN 2 SENSITIVE Sensitive     IMIPENEM 0.5 SENSITIVE Sensitive     PIP/TAZO 8 SENSITIVE Sensitive     CEFEPIME 2 SENSITIVE Sensitive     * >=100,000 COLONIES/mL PSEUDOMONAS AERUGINOSA  Blood culture (routine x 2)     Status: Abnormal   Collection Time: 01/07/16  6:30 PM  Result Value Ref Range Status   Specimen Description BLOOD LEFT ARM  Final   Special Requests BOTTLES DRAWN AEROBIC ONLY 5CC  Final   Culture  Setup Time   Final    GRAM POSITIVE COCCI IN CLUSTERS AEROBIC BOTTLE ONLY CRITICAL RESULT CALLED TO, READ BACK BY AND VERIFIED WITH: A PETTIFORD,RN @0525  01/09/16 MKELLY    Culture STAPHYLOCOCCUS SPECIES (COAGULASE NEGATIVE) (A)  Final   Report Status 01/11/2016 FINAL  Final   Organism ID, Bacteria STAPHYLOCOCCUS SPECIES (COAGULASE NEGATIVE)   Final      Susceptibility   Staphylococcus species (coagulase negative) - MIC*    CIPROFLOXACIN <=0.5 SENSITIVE Sensitive     ERYTHROMYCIN 0.5 SENSITIVE Sensitive     GENTAMICIN <=0.5 SENSITIVE Sensitive     OXACILLIN <=0.25 SENSITIVE Sensitive     TETRACYCLINE <=1 SENSITIVE Sensitive     VANCOMYCIN 1  SENSITIVE Sensitive     TRIMETH/SULFA 20 SENSITIVE Sensitive     CLINDAMYCIN <=0.25 SENSITIVE Sensitive     RIFAMPIN <=0.5 SENSITIVE Sensitive     Inducible Clindamycin NEGATIVE Sensitive     * STAPHYLOCOCCUS SPECIES (COAGULASE NEGATIVE)  MRSA PCR Screening     Status: Abnormal   Collection Time: 01/08/16  4:28 PM  Result Value Ref Range Status   MRSA by PCR POSITIVE (A) NEGATIVE Final    Comment:        The GeneXpert MRSA Assay (FDA approved for NASAL specimens only), is one component of a comprehensive MRSA colonization surveillance program. It is not intended to diagnose MRSA infection nor to guide or monitor treatment for MRSA infections. RESULT CALLED TO, READ BACK BY AND VERIFIED WITH: T.NEILSON,RN BY L.PITT AT 2158   Culture, blood (routine x 2)     Status: None (Preliminary result)   Collection Time: 01/10/16 11:30 AM  Result Value Ref Range Status   Specimen Description BLOOD RIGHT HAND  Final   Special Requests IN PEDIATRIC BOTTLE 0.5CC  Final   Culture NO GROWTH 1 DAY  Final   Report Status PENDING  Incomplete  Culture, blood (routine x 2)     Status: None (Preliminary result)   Collection Time: 01/10/16 12:15 PM  Result Value Ref Range Status   Specimen Description BLOOD RIGHT HAND  Final   Special Requests IN PEDIATRIC BOTTLE 0.5CC  Final   Culture NO GROWTH 1 DAY  Final   Report Status PENDING  Incomplete       Today   Subjective    William Dalton today has no headache,no chest abdominal pain,no new weakness tingling or numbness, feels much better   Objective   Blood pressure 109/59, pulse 71, temperature 97.8 F (36.6 C), temperature source Oral,  resp. rate 18, height 5\' 2"  (1.575 m), weight 75.1 kg (165 lb 9.1 oz), SpO2 94 %.   Intake/Output Summary (Last 24 hours) at 01/12/16 1300 Last data filed at 01/12/16 0700  Gross per 24 hour  Intake    160 ml  Output    500 ml  Net   -340 ml    Exam Awake Alert,  No new F.N deficits, Normal affect [place].AT,PERRAL Supple Neck,No JVD, No cervical lymphadenopathy appriciated.  Symmetrical Chest wall movement, Good air movement bilaterally, Coarse breath sounds RRR,No Gallops,Rubs or new Murmurs, No Parasternal Heave +ve B.Sounds, Abd Soft, Non tender, No organomegaly appriciated, No rebound -guarding or rigidity. No Cyanosis, Clubbing or edema, No new Rash , diffuse bruising, chronic leg contractures   Data Review   CBC w Diff:  Lab Results  Component Value Date   WBC 11.0* 01/10/2016   WBC 10.1 11/17/2015   HGB 10.1* 01/10/2016   HCT 32.7* 01/10/2016   PLT 198 01/10/2016   LYMPHOPCT 1 01/08/2016   MONOPCT 3 01/08/2016   EOSPCT 0 01/08/2016   BASOPCT 0 01/08/2016    CMP:  Lab Results  Component Value Date   NA 142 01/10/2016   NA 140 10/06/2015   K 4.7 01/11/2016   CL 109 01/10/2016   CO2 19* 01/10/2016   BUN 38* 01/10/2016   BUN 36* 10/06/2015   CREATININE 1.50* 01/10/2016   CREATININE 1.0 10/06/2015   GLU 118 10/06/2015   PROT 5.8* 01/07/2016   ALBUMIN 2.4* 01/07/2016   BILITOT 1.2 01/07/2016   ALKPHOS 97 01/07/2016   AST 68* 01/07/2016   ALT 41 01/07/2016  .   Total Time in preparing  paper work, data evaluation and todays exam - 35 minutes  Thurnell Lose M.D on 01/12/2016 at 1:00 PM  [facility]   Office  [phone]

## 2016-01-12 NOTE — Procedures (Signed)
Objective Swallowing Evaluation: Type of Study: FEES-Fiberoptic Endoscopic Evaluation of Swallow  Patient Details  Name: Caitlin Hampton MRN: [id number] Date of Birth: Jan 30, 1946  Today's Date: 01/12/2016 Time: SLP Start Time (ACUTE ONLY): 1315-SLP Stop Time (ACUTE ONLY): [id number] SLP Time Calculation (min) (ACUTE ONLY): 32 min  Past Medical History:  Past Medical History  Diagnosis Date  . Hypertension   . Thyroid disease   . COPD (chronic obstructive pulmonary disease) ([place])   . Anxiety   . H/O hiatal hernia   . Hypothyroidism   . Shortness of breath   . Atrial flutter ([place]) 09/07/2012    , RVR/ hospital 12/13- rate control and Echo normal- on xarelto, when spontaneously converted she had bradycardia on Metoprolol and Digoxin  . GERD (gastroesophageal reflux disease)   . Rhinitis   . Parotitis     , on CT of face- 12/13  . CHF (congestive heart failure) ([place])   . Hx of echocardiogram     Echo (8/15):  EF 60-65%  . NSTEMI (non-ST elevated myocardial infarction) ([place])   . CAP (community acquired pneumonia) 09/14/2015   Past Surgical History:  Past Surgical History  Procedure Laterality Date  . Tonsillectomy    . Breast surgery      benign R brest CA  . Cardioversion N/A 04/02/2014    Procedure: CARDIOVERSION;  Surgeon: Josue Hector, MD;  Location: [place];  Service: Cardiovascular;  Laterality: N/A;  . Cardioversion N/A 08/17/2014    Procedure: CARDIOVERSION;  Surgeon: [name], MD;  Location: [place];  Service: Cardiovascular;  Laterality: N/A;  . I&d extremity Right 09/18/2015    Procedure: IRRIGATION AND DEBRIDEMENT RIGHT LOWER LEG.;  Surgeon: Mcarthur Rossetti, MD;  Location: [place];  Service: Orthopedics;  Laterality: Right;  . Application of wound vac Right 09/18/2015    Procedure: APPLICATION OF WOUND VAC RIGHT LOWER LEG;  Surgeon: Mcarthur Rossetti, MD;  Location: [place];  Service: Orthopedics;  Laterality: Right;  . Skin split graft Right 09/23/2015   Procedure: SKIN GRAFT SPLIT THICKNESS RIGHT LEG;  Surgeon: Mcarthur Rossetti, MD;  Location: [place];  Service: Orthopedics;  Laterality: Right;  Right Thigh   HPI: 70 y.o. woman with a history of COPD with chronic O2 dependence (3-4L [place] at baseline), HTN, chronic afib/flutter (anticoagulated with Xarelto), diastolic heart failure, CAD, and dementia who has been a resident in a skilled nursing facility since her admission here in December for HCAP and right leg ulcer. Being followed by Palliative Medicine.    Subjective: pt alert, cooperative  Assessment / Plan / Recommendation  CHL IP CLINICAL IMPRESSIONS 01/12/2016  Therapy Diagnosis Moderate pharyngeal phase dysphagia  Clinical Impression Pt demonstrates a moderate oropharyngeal dyspahgia due to a multitude of risk factors. Pt was assessed in her primary position for meals and PO intake through the day per  her sister, which is reclined, pt is resistant to sitting fully upright. Swallow initiaition is delayed and laryngeal closure is incomplete, leading to penetration to the cords before and during the swallow. Cues to cough did not show expectorated aspirate, but cough is insufficient to clear airway, so aspiration cannot be diagnosed, but is probable. There was no functional change with thickened liquids. GIven that pt is is profoundly weak, immobile and swallow function is declining, pts intermittent risk of aspiration is high. Pts sister wants to continue providing PO of choice but does not necessarily understand risk of aspiration despite teaching. Recommend f/u conversation with Sister and MD  to confirm PO intake with known risk with dys 3 mechanical soft diet and thin liquids.   Impact on safety and function Severe aspiration risk      CHL IP TREATMENT RECOMMENDATION 01/12/2016  Treatment Recommendations Therapy as outlined in treatment plan below     Prognosis 01/12/2016  Prognosis for Safe Diet Advancement Guarded  Barriers to Reach  Goals --  Barriers/Prognosis Comment --    CHL IP DIET RECOMMENDATION 01/12/2016  SLP Diet Recommendations Dysphagia 3 (Mech soft) solids;Thin liquid  Liquid Administration via Straw  Medication Administration Whole meds with puree  Compensations Slow rate;Minimize environmental distractions;Small sips/bites  Postural Changes Remain semi-upright after after feeds/meals (Comment)      CHL IP OTHER RECOMMENDATIONS 01/12/2016  Recommended Consults --  Oral Care Recommendations Oral care QID  Other Recommendations --      CHL IP FOLLOW UP RECOMMENDATIONS 01/12/2016  Follow up Recommendations Skilled Nursing facility      [facility] IP FREQUENCY AND DURATION 01/12/2016  Speech Therapy Frequency (ACUTE ONLY) min 2x/week  Treatment Duration 1 week           CHL IP ORAL PHASE 01/12/2016  Oral Phase Impaired  Oral - Pudding Teaspoon --  Oral - Pudding Cup --  Oral - Honey Teaspoon --  Oral - Honey Cup --  Oral - Nectar Teaspoon --  Oral - Nectar Cup --  Oral - Nectar Straw Decreased bolus cohesion;Premature spillage  Oral - Thin Teaspoon --  Oral - Thin Cup --  Oral - Thin Straw Decreased bolus cohesion;Premature spillage  Oral - Puree Decreased bolus cohesion;Premature spillage  Oral - Mech Soft NT  Oral - Regular --  Oral - Multi-Consistency --  Oral - Pill --  Oral Phase - Comment --    CHL IP PHARYNGEAL PHASE 01/12/2016  Pharyngeal Phase Impaired  Pharyngeal- Pudding Teaspoon --  Pharyngeal --  Pharyngeal- Pudding Cup --  Pharyngeal --  Pharyngeal- Honey Teaspoon --  Pharyngeal --  Pharyngeal- Honey Cup --  Pharyngeal --  Pharyngeal- Nectar Teaspoon --  Pharyngeal --  Pharyngeal- Nectar Cup --  Pharyngeal --  Pharyngeal- Nectar Straw Delayed swallow initiation-pyriform sinuses;Reduced airway/laryngeal closure;Penetration/Aspiration before swallow;Penetration/Aspiration during swallow  Pharyngeal Material enters airway, CONTACTS cords and not ejected out  Pharyngeal- Thin  Teaspoon --  Pharyngeal --  Pharyngeal- Thin Cup --  Pharyngeal --  Pharyngeal- Thin Straw Delayed swallow initiation-pyriform sinuses;Reduced airway/laryngeal closure;Penetration/Aspiration before swallow;Penetration/Aspiration during swallow  Pharyngeal Material enters airway, CONTACTS cords and not ejected out  Pharyngeal- Puree Delayed swallow initiation-vallecula  Pharyngeal --  Pharyngeal- Mechanical Soft NT  Pharyngeal --  Pharyngeal- Regular --  Pharyngeal --  Pharyngeal- Multi-consistency --  Pharyngeal --  Pharyngeal- Pill --  Pharyngeal --  Pharyngeal Comment --     CHL IP CERVICAL ESOPHAGEAL PHASE 01/12/2016  Cervical Esophageal Phase Impaired  Pudding Teaspoon --  Pudding Cup --  Honey Teaspoon --  Honey Cup --  Nectar Teaspoon --  Nectar Cup --  Nectar Straw --  Thin Teaspoon --  Thin Cup --  Thin Straw --  Puree --  Mechanical Soft --  Regular --  Multi-consistency --  Pill --  Cervical Esophageal Comment back flow and aspiration of backflow with initial ice chips    No flowsheet data found.  Shun Pletz, Katherene Ponto 01/12/2016, 2:47 PM

## 2016-01-12 NOTE — Discharge Instructions (Signed)
Follow with Primary MD Gildardo Cranker, DO in 7 days   Get CBC, CMP, 2 view Chest X ray checked  by Primary MD next visit.    Activity: As tolerated with Full fall precautions use walker/cane & assistance as needed   Disposition SNF   Diet: Dysphagia 1 diet with feeding assistance and aspiration precautions.  For Heart failure patients - Check your Weight same time everyday, if you gain over 2 pounds, or you develop in leg swelling, experience more shortness of breath or chest pain, call your Primary MD immediately. Follow Cardiac Low Salt Diet and 1.5 lit/day fluid restriction.   On your next visit with your primary care physician please Get Medicines reviewed and adjusted.   Please request your Prim.MD to go over all Hospital Tests and Procedure/Radiological results at the follow up, please get all Hospital records sent to your Prim MD by signing hospital release before you go home.   If you experience worsening of your admission symptoms, develop shortness of breath, life threatening emergency, suicidal or homicidal thoughts you must seek medical attention immediately by calling 911 or calling your MD immediately  if symptoms less severe.  You Must read complete instructions/literature along with all the possible adverse reactions/side effects for all the Medicines you take and that have been prescribed to you. Take any new Medicines after you have completely understood and accpet all the possible adverse reactions/side effects.   Do not drive, operating heavy machinery, perform activities at heights, swimming or participation in water activities or provide baby sitting services if your were admitted for syncope or siezures until you have seen by Primary MD or a Neurologist and advised to do so again.  Do not drive when taking Pain medications.    Do not take more than prescribed Pain, Sleep and Anxiety Medications  Special Instructions: If you have smoked or chewed Tobacco  in the  last 2 yrs please stop smoking, stop any regular Alcohol  and or any Recreational drug use.  Wear Seat belts while driving.   Please note  You were cared for by a hospitalist during your hospital stay. If you have any questions about your discharge medications or the care you received while you were in the hospital after you are discharged, you can call the unit and asked to speak with the hospitalist on call if the hospitalist that took care of you is not available. Once you are discharged, your primary care physician will handle any further medical issues. Please note that NO REFILLS for any discharge medications will be authorized once you are discharged, as it is imperative that you return to your primary care physician (or establish a relationship with a primary care physician if you do not have one) for your aftercare needs so that they can reassess your need for medications and monitor your lab values.

## 2016-01-12 NOTE — Progress Notes (Signed)
Caitlin Hampton discharged Skilled nursing facility per MD order.  Report called to receiving Caitlin Hampton at [facility].     Medication List    STOP taking these medications        cloNIDine 0.1 MG tablet  Commonly known as:  CATAPRES     HYDROcodone-acetaminophen 5-325 MG tablet  Commonly known as:  NORCO/VICODIN     potassium chloride 10 MEQ tablet  Commonly known as:  K-DUR     rivaroxaban 20 MG Tabs tablet  Commonly known as:  XARELTO      TAKE these medications        acetaminophen 500 MG tablet  Commonly known as:  TYLENOL  Take 1,000 mg by mouth every 6 (six) hours as needed for mild pain.     albuterol (2.5 MG/3ML) 0.083% nebulizer solution  Commonly known as:  PROVENTIL  Take 2.5 mg by nebulization every 6 (six) hours as needed for wheezing or shortness of breath.     arformoterol 15 MCG/2ML Nebu  Commonly known as:  BROVANA  Take 2 mLs (15 mcg total) by nebulization 2 (two) times daily.     aspirin 81 MG chewable tablet  Chew 1 tablet (81 mg total) by mouth daily.     atorvastatin 10 MG tablet  Commonly known as:  LIPITOR  Take 10 mg by mouth daily at 6 PM.     bisacodyl 5 MG EC tablet  Commonly known as:  DULCOLAX  Take 2 tablets (10 mg total) by mouth daily as needed for moderate constipation.     bisacodyl 10 MG suppository  Commonly known as:  DULCOLAX  Place 1 suppository (10 mg total) rectally daily as needed for moderate constipation.     budesonide 0.25 MG/2ML nebulizer solution  Commonly known as:  PULMICORT  Take 2 mLs (0.25 mg total) by nebulization 2 (two) times daily.     budesonide-formoterol 160-4.5 MCG/ACT inhaler  Commonly known as:  SYMBICORT  Inhale 2 puffs into the lungs 2 (two) times daily. 9am, 5pm     clindamycin 300 MG capsule  Commonly known as:  CLEOCIN  Take 1 capsule (300 mg total) by mouth every 8 (eight) hours. For 9 days     diltiazem 240 MG 24 hr capsule  Commonly known as:  CARDIZEM CD  Take 1 capsule (240 mg  total) by mouth daily.     feeding supplement (PRO-STAT SUGAR FREE 64) Liqd  Take 30 mLs by mouth 2 (two) times daily.     furosemide 40 MG tablet  Commonly known as:  LASIX  Take 1.5 tablets (60 mg total) by mouth 2 (two) times daily.     gabapentin 100 MG capsule  Commonly known as:  NEURONTIN  Take 100 mg by mouth 3 (three) times daily. 12pm, 4pm, 8pm     iron polysaccharides 150 MG capsule  Commonly known as:  NIFEREX  Take 1 capsule (150 mg total) by mouth 2 (two) times daily.     levothyroxine 100 MCG tablet  Commonly known as:  SYNTHROID, LEVOTHROID  Take 100 mcg by mouth daily before breakfast.     LORazepam 2 MG/ML concentrated solution  Commonly known as:  ATIVAN  Take 0.5 mLs (1 mg total) by mouth every 6 (six) hours as needed for anxiety.     mirtazapine 15 MG tablet  Commonly known as:  REMERON  Take 15 mg by mouth at bedtime.     morphine CONCENTRATE 10 MG/0.5ML Soln concentrated solution  Take 0.5 mLs (10 mg total) by mouth every 3 (three) hours as needed for moderate pain or severe pain.     NUTRITIONAL SUPPLEMENT PO  Take by mouth 2 (two) times daily. "Magic cup" 9am, 5pm     OXYGEN  Inhale into the lungs continuous. 3L     pantoprazole 40 MG tablet  Commonly known as:  PROTONIX  Take 1 tablet (40 mg total) by mouth daily at 12 noon.     potassium chloride SA 20 MEQ tablet  Commonly known as:  K-DUR,KLOR-CON  TAKE ONE TABLET EACH DAY     saccharomyces boulardii 250 MG capsule  Commonly known as:  FLORASTOR  Take 1 capsule (250 mg total) by mouth 2 (two) times daily.     senna-docusate 8.6-50 MG tablet  Commonly known as:  Senokot-S  Take 2 tablets by mouth 2 (two) times daily.     tiotropium 18 MCG inhalation capsule  Commonly known as:  SPIRIVA  Place 18 mcg into inhaler and inhale daily.     Vitamin D3 50000 units Caps  Take 50,000 Units by mouth every Saturday.        Patients skin is clean, weeping with several skin tears, also has  a blood blister to right foot with provalon boot on.. IV site discontinued and catheter remains intact. Site without signs and symptoms of complications. Dressing and pressure applied.  Patient transported on a stretcher by non emergent EMS,  no distress noted upon discharge.  Caitlin Hampton, Caitlin Hampton 01/12/2016 5:39 PM

## 2016-01-12 NOTE — Progress Notes (Signed)
PT Cancellation Note  Patient Details Name: Caitlin Hampton MRN: [id number] DOB: 1946-02-05   Cancelled Treatment:    Reason Eval/Treat Not Completed: Medical issues which prohibited therapy (pt reported abdominal pain/nausea and stated she couldn't tolerate activity right now, RN notiified. Will follow. )   Philomena Doheny 01/12/2016, 11:02 AM [phone]

## 2016-01-12 NOTE — Progress Notes (Signed)
Patient will DC to: [facility] Anticipated DC date: 01/12/16 Family notified: Sister, Lelon Frohlich Transport by: PTAR 3:15pm  CSW signing off.  Cedric Fishman, [place] Social Worker [phone]

## 2016-01-15 LAB — CULTURE, BLOOD (ROUTINE X 2)
CULTURE: NO GROWTH
Culture: NO GROWTH

## 2016-01-22 DEATH — deceased

## 2017-01-21 IMAGING — CR DG TIBIA/FIBULA PORT 2V*R*
4 series · 4 of 4 positions shown · non-contrast
Comparison: None.

CLINICAL DATA: Status post fall today with a right lower leg injury
and pain. Initial encounter.

EXAM:
PORTABLE RIGHT TIBIA AND FIBULA - 2 VIEW

[AP (1 of 2)]
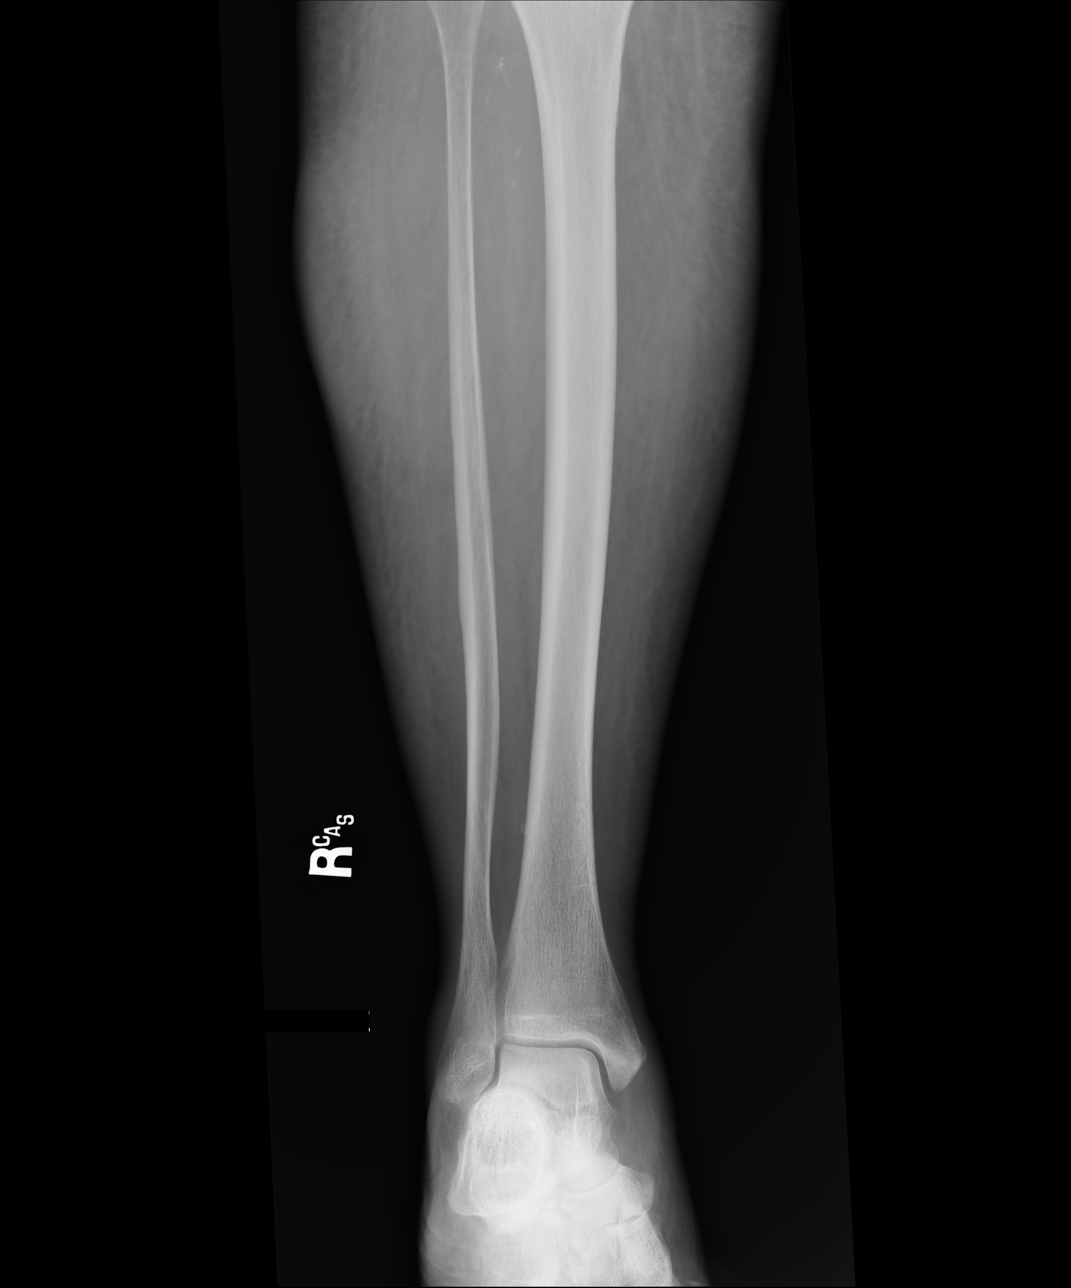

[AP (2 of 2)]
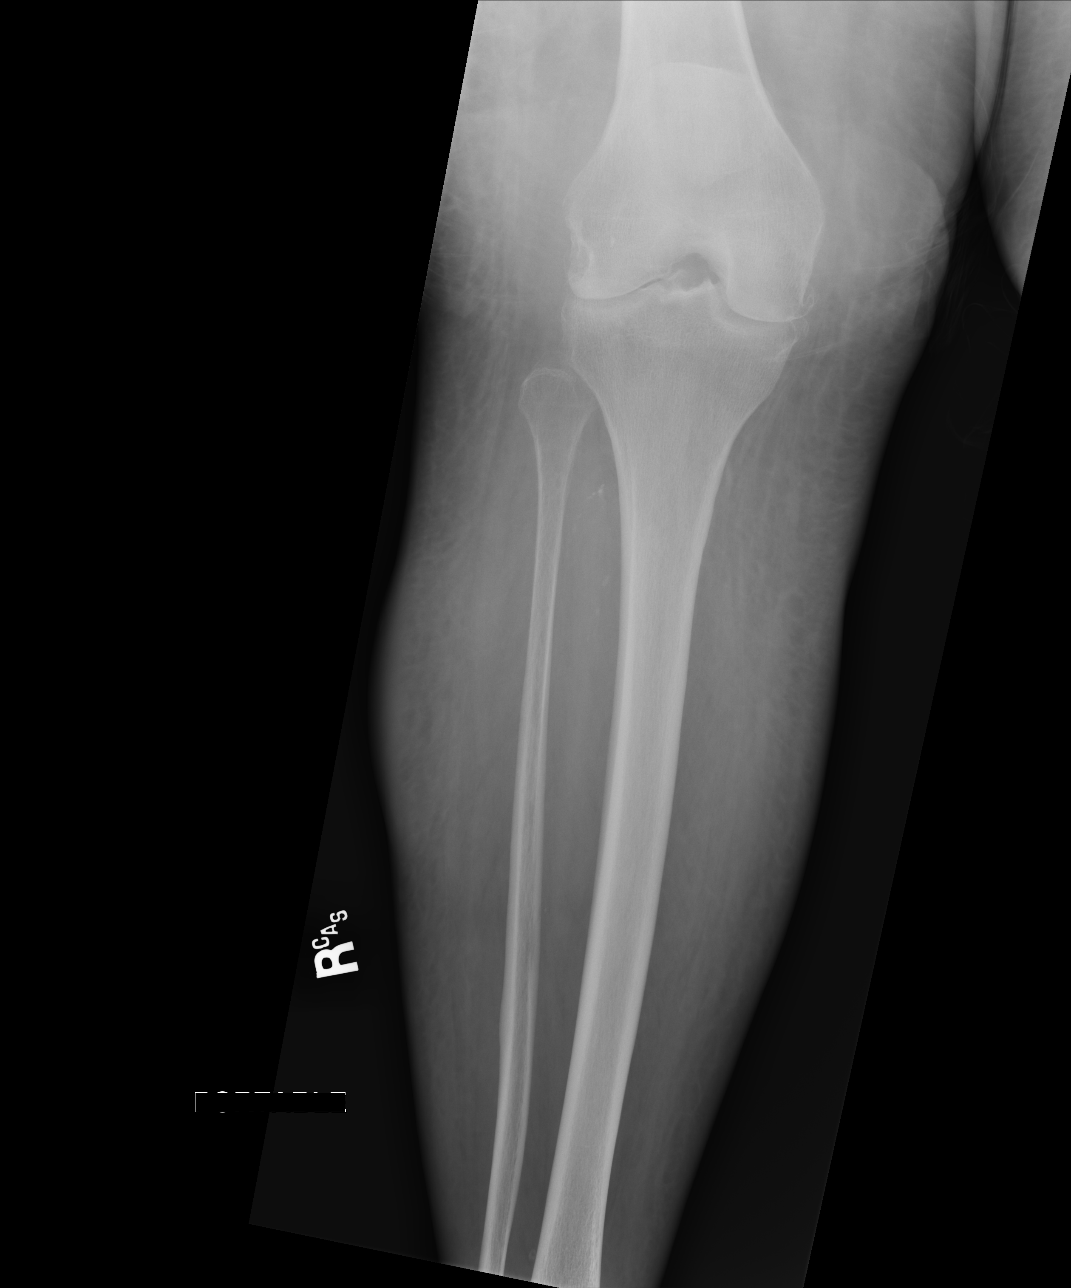

[lateral (1 of 2)]
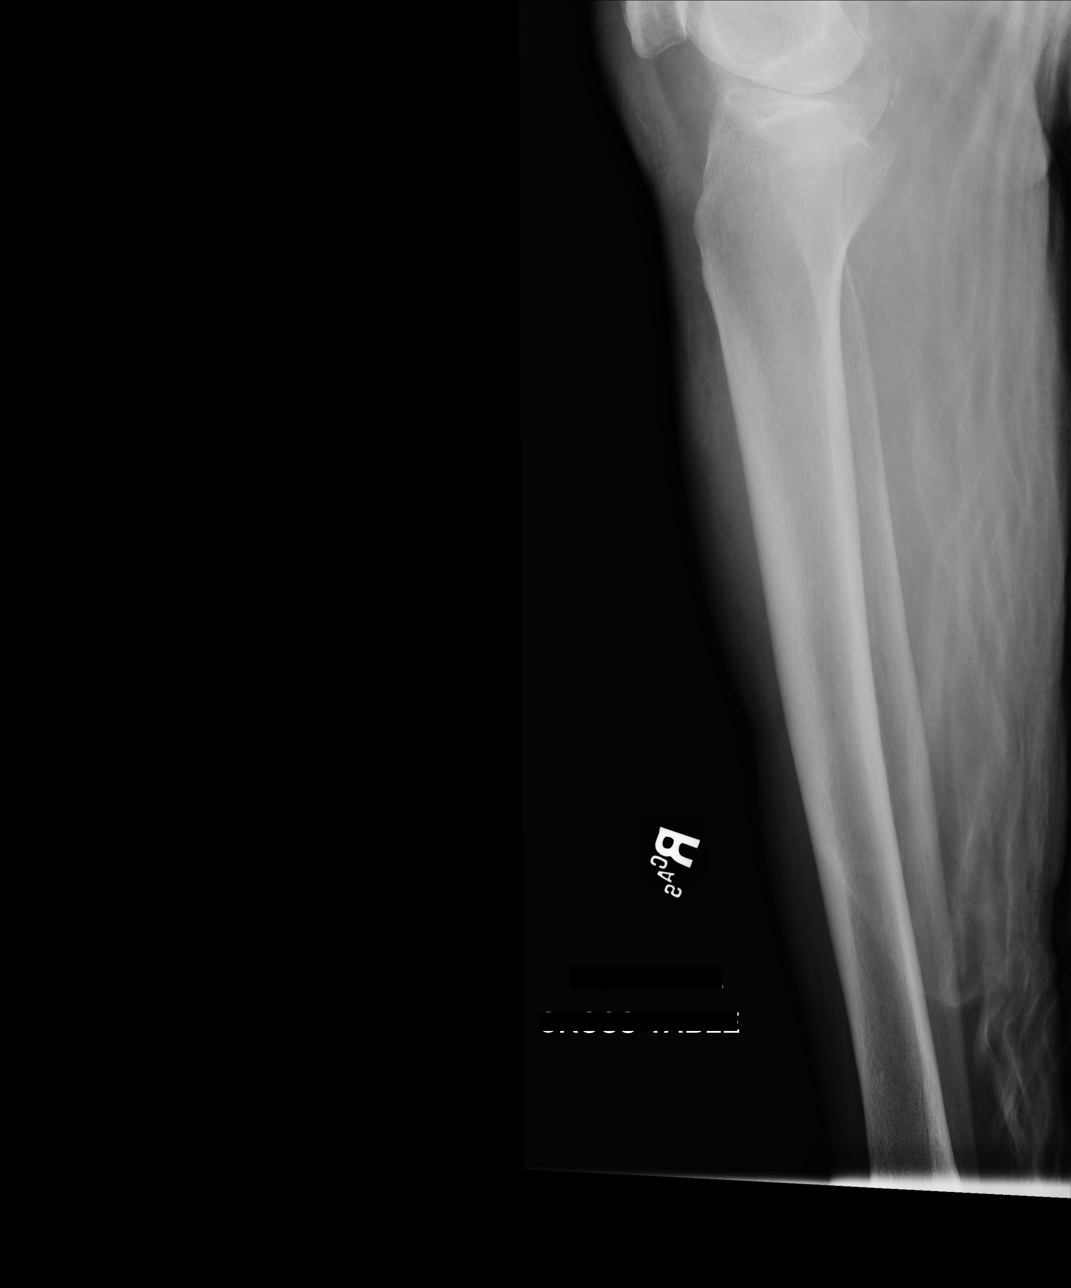

[lateral (2 of 2)]
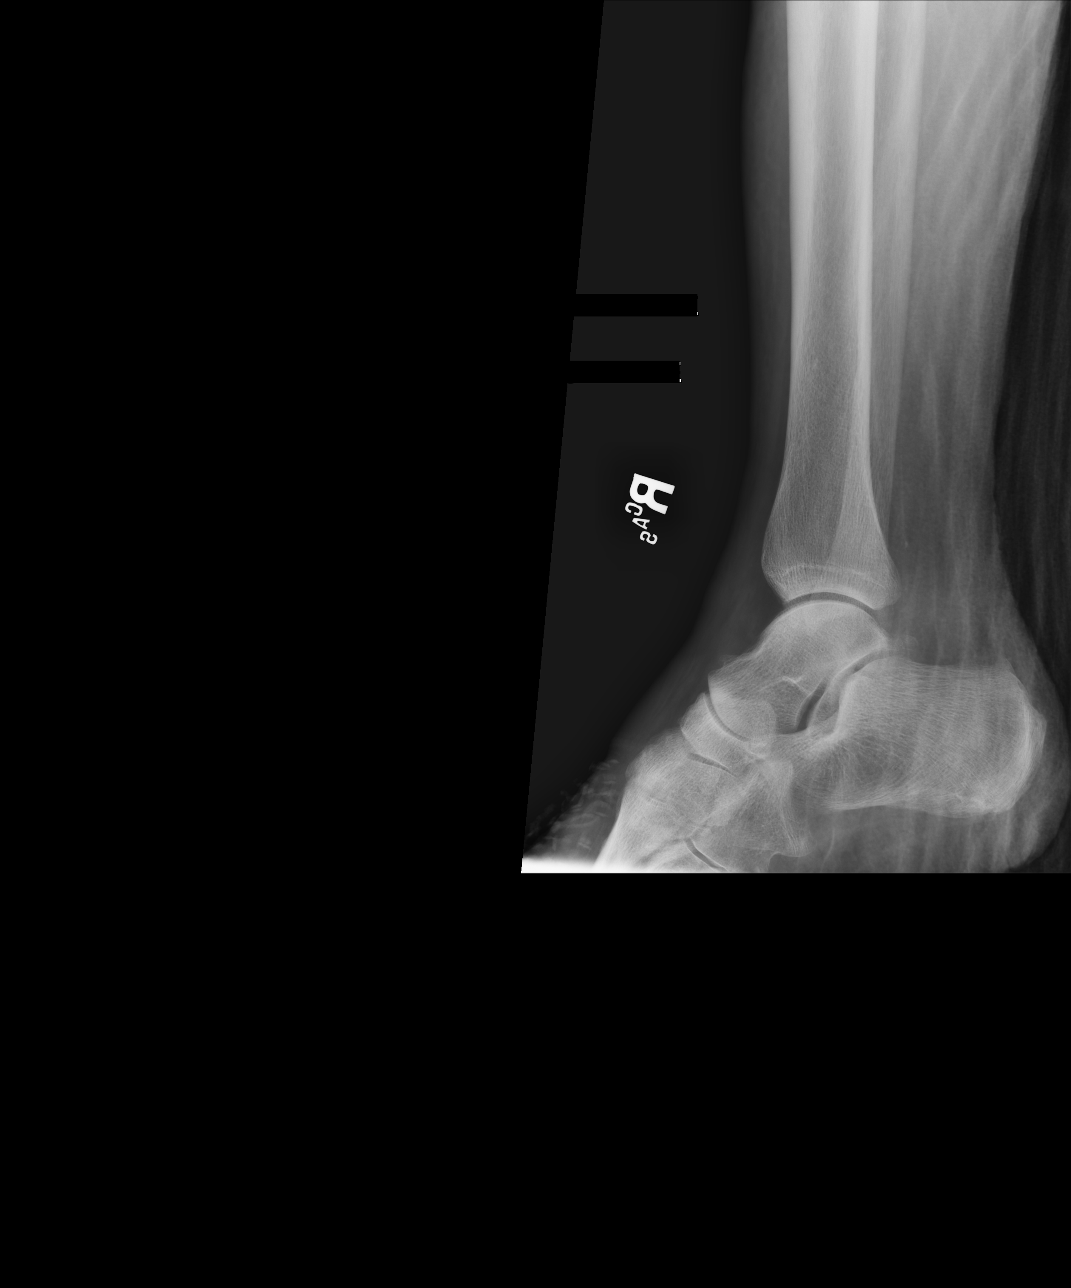

[4 of 4 positions shown; findings below may reference images not displayed]

FINDINGS: No acute bony or joint abnormality is identified. Degenerative
change about the right knee is noted. Scattered atherosclerotic
calcifications are seen.
IMPRESSION: No acute abnormality.

## 2017-01-24 IMAGING — DX DG CHEST 2V
2 series · 2 of 2 positions shown · non-contrast
Comparison: September 11, 2015

CLINICAL DATA: Shortness of Breath

EXAM:
CHEST  2 VIEW

[chest lat]
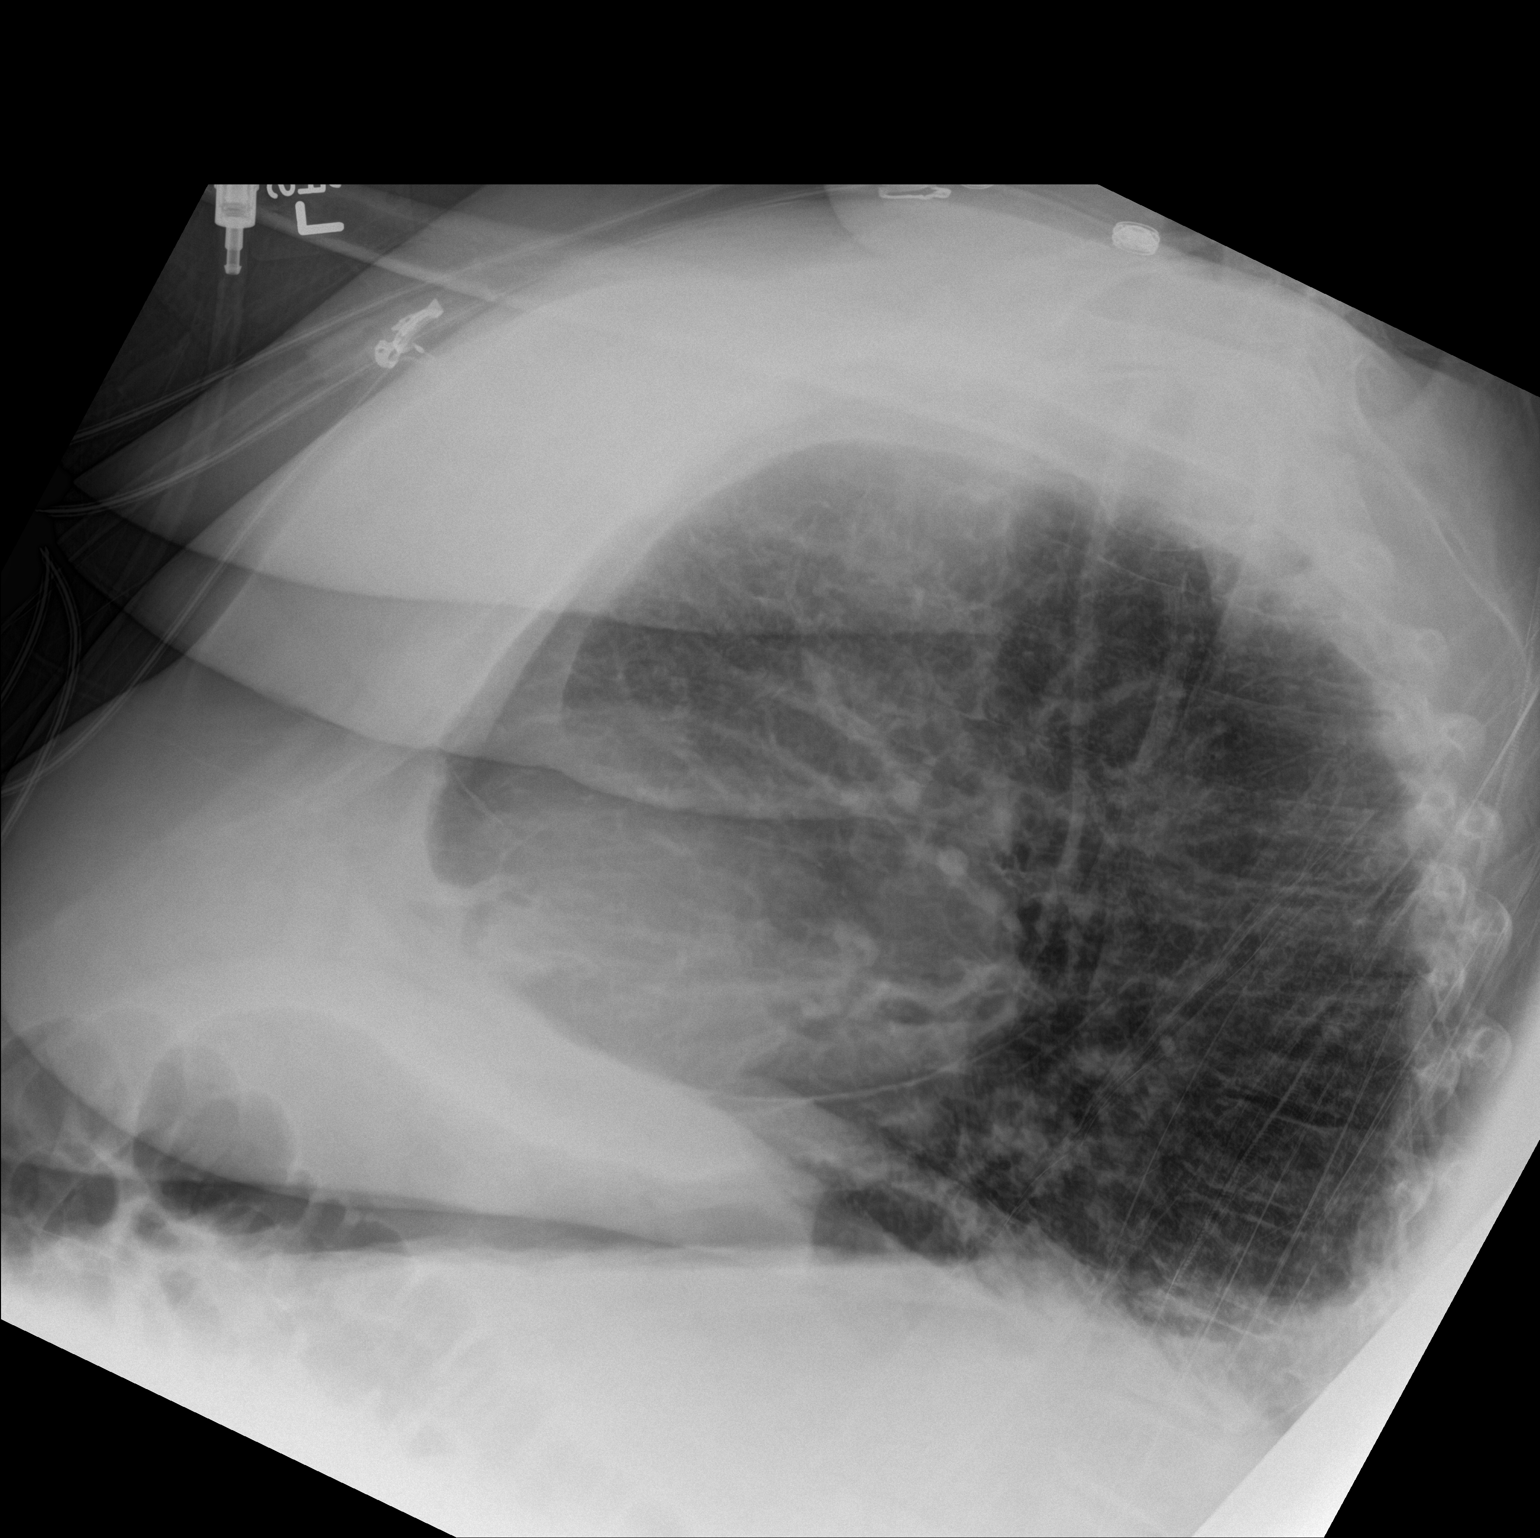

[chest ap]
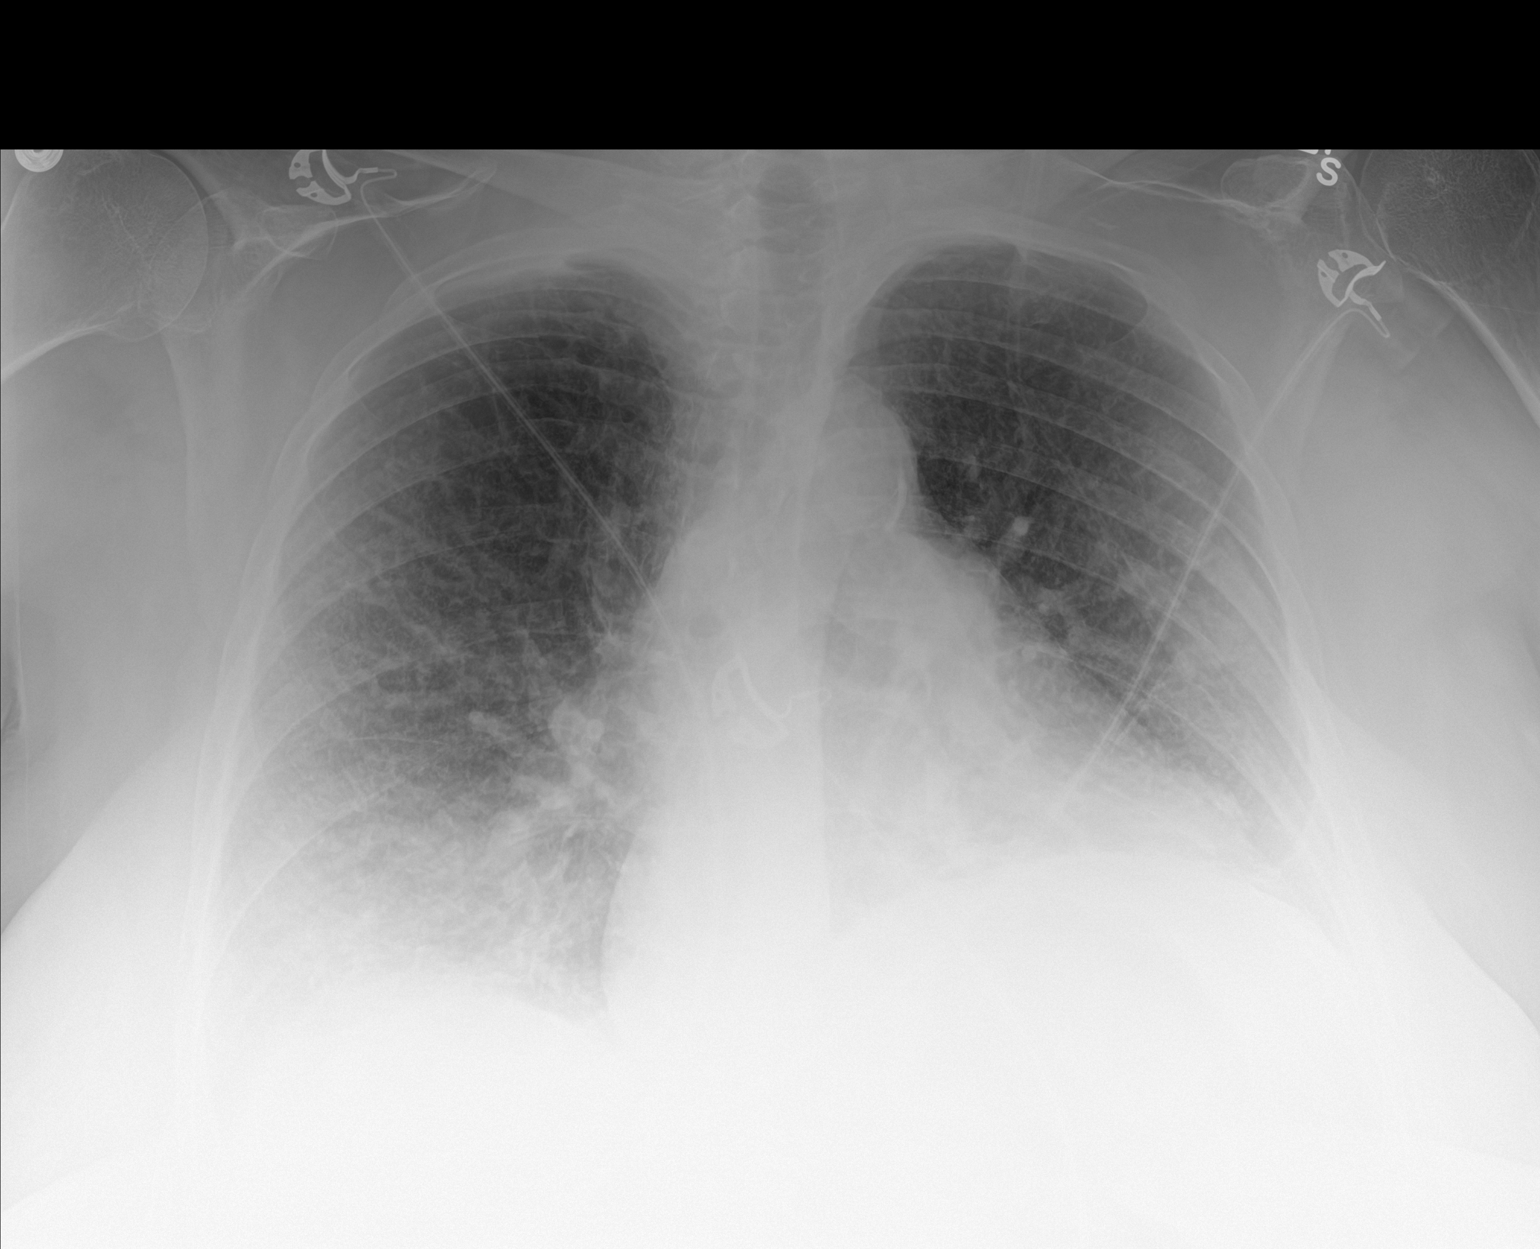

[2 of 2 positions shown; findings below may reference images not displayed]

FINDINGS: There is patchy atelectasis in each lung base. There is mild
interstitial edema with cardiomegaly and borderline pulmonary venous
hypertension. No adenopathy. There is atherosclerotic calcification
in the aorta. There is degenerative change in the thoracic spine.
There is a small right pleural effusion.
IMPRESSION: Findings indicative of a degree of congestive heart failure.
